# Patient Record
Sex: Male | Born: 1940 | Race: White | Hispanic: No | Smoking: Current every day smoker
Health system: Southern US, Community
[De-identification: ages and names within clinical notes are randomized; demographics above are authoritative.]

## PROBLEM LIST (undated history)

## (undated) ENCOUNTER — Emergency Department (HOSPITAL_COMMUNITY): Admission: EM | Payer: Medicare Other | Source: Home / Self Care

## (undated) DIAGNOSIS — A481 Legionnaires' disease: Secondary | ICD-10-CM

## (undated) DIAGNOSIS — I350 Nonrheumatic aortic (valve) stenosis: Secondary | ICD-10-CM

## (undated) DIAGNOSIS — G43909 Migraine, unspecified, not intractable, without status migrainosus: Secondary | ICD-10-CM

## (undated) DIAGNOSIS — R519 Headache, unspecified: Secondary | ICD-10-CM

## (undated) DIAGNOSIS — M4690 Unspecified inflammatory spondylopathy, site unspecified: Secondary | ICD-10-CM

## (undated) DIAGNOSIS — J189 Pneumonia, unspecified organism: Secondary | ICD-10-CM

## (undated) DIAGNOSIS — E7800 Pure hypercholesterolemia, unspecified: Secondary | ICD-10-CM

## (undated) DIAGNOSIS — R112 Nausea with vomiting, unspecified: Secondary | ICD-10-CM

## (undated) DIAGNOSIS — J80 Acute respiratory distress syndrome: Secondary | ICD-10-CM

## (undated) DIAGNOSIS — Z8719 Personal history of other diseases of the digestive system: Secondary | ICD-10-CM

## (undated) DIAGNOSIS — Z86010 Personal history of colonic polyps: Secondary | ICD-10-CM

## (undated) DIAGNOSIS — J449 Chronic obstructive pulmonary disease, unspecified: Secondary | ICD-10-CM

## (undated) DIAGNOSIS — M1990 Unspecified osteoarthritis, unspecified site: Secondary | ICD-10-CM

## (undated) DIAGNOSIS — I10 Essential (primary) hypertension: Secondary | ICD-10-CM

## (undated) DIAGNOSIS — M542 Cervicalgia: Secondary | ICD-10-CM

## (undated) DIAGNOSIS — I639 Cerebral infarction, unspecified: Secondary | ICD-10-CM

## (undated) DIAGNOSIS — T4145XA Adverse effect of unspecified anesthetic, initial encounter: Secondary | ICD-10-CM

## (undated) DIAGNOSIS — F419 Anxiety disorder, unspecified: Secondary | ICD-10-CM

## (undated) DIAGNOSIS — F410 Panic disorder [episodic paroxysmal anxiety] without agoraphobia: Secondary | ICD-10-CM

## (undated) DIAGNOSIS — Z9889 Other specified postprocedural states: Secondary | ICD-10-CM

## (undated) DIAGNOSIS — Z860101 Personal history of adenomatous and serrated colon polyps: Secondary | ICD-10-CM

## (undated) DIAGNOSIS — I214 Non-ST elevation (NSTEMI) myocardial infarction: Secondary | ICD-10-CM

## (undated) DIAGNOSIS — K219 Gastro-esophageal reflux disease without esophagitis: Secondary | ICD-10-CM

## (undated) DIAGNOSIS — R9430 Abnormal result of cardiovascular function study, unspecified: Secondary | ICD-10-CM

## (undated) DIAGNOSIS — R51 Headache: Secondary | ICD-10-CM

## (undated) HISTORY — DX: Personal history of colonic polyps: Z86.010

## (undated) HISTORY — PX: FOOT NEUROMA SURGERY: SHX646

## (undated) HISTORY — PX: BACK SURGERY: SHX140

## (undated) HISTORY — PX: NECK SURGERY: SHX720

## (undated) HISTORY — DX: Cervicalgia: M54.2

## (undated) HISTORY — DX: Abnormal result of cardiovascular function study, unspecified: R94.30

## (undated) HISTORY — PX: LUMBAR FUSION: SHX111

## (undated) HISTORY — PX: TONSILLECTOMY: SUR1361

## (undated) HISTORY — PX: TRACHEOSTOMY CLOSURE: SHX458

## (undated) HISTORY — PX: TRACHEOSTOMY: SUR1362

## (undated) HISTORY — DX: Gastro-esophageal reflux disease without esophagitis: K21.9

## (undated) HISTORY — PX: CARPAL TUNNEL RELEASE: SHX101

## (undated) HISTORY — DX: Migraine, unspecified, not intractable, without status migrainosus: G43.909

## (undated) HISTORY — DX: Unspecified inflammatory spondylopathy, site unspecified: M46.90

## (undated) HISTORY — DX: Personal history of adenomatous and serrated colon polyps: Z86.0101

## (undated) HISTORY — PX: ROTATOR CUFF REPAIR: SHX139

## (undated) HISTORY — DX: Legionnaires' disease: A48.1

## (undated) HISTORY — DX: Anxiety disorder, unspecified: F41.9

## (undated) HISTORY — PX: SHOULDER SURGERY: SHX246

---

## 1997-09-29 ENCOUNTER — Inpatient Hospital Stay (HOSPITAL_COMMUNITY): Admission: RE | Admit: 1997-09-29 | Discharge: 1997-09-30 | Payer: Self-pay | Admitting: Neurological Surgery

## 1998-01-10 HISTORY — PX: BACK SURGERY: SHX140

## 1998-02-10 ENCOUNTER — Encounter: Admission: RE | Admit: 1998-02-10 | Discharge: 1998-04-15 | Payer: Self-pay | Admitting: Neurological Surgery

## 1998-06-02 ENCOUNTER — Encounter: Payer: Self-pay | Admitting: Neurological Surgery

## 1998-06-02 ENCOUNTER — Ambulatory Visit (HOSPITAL_COMMUNITY): Admission: RE | Admit: 1998-06-02 | Discharge: 1998-06-02 | Payer: Self-pay | Admitting: Neurological Surgery

## 1999-09-21 ENCOUNTER — Encounter: Payer: Self-pay | Admitting: Neurological Surgery

## 1999-09-21 ENCOUNTER — Ambulatory Visit (HOSPITAL_COMMUNITY): Admission: RE | Admit: 1999-09-21 | Discharge: 1999-09-21 | Payer: Self-pay | Admitting: Neurological Surgery

## 1999-10-08 ENCOUNTER — Encounter: Payer: Self-pay | Admitting: Neurological Surgery

## 1999-10-12 ENCOUNTER — Encounter: Payer: Self-pay | Admitting: Neurological Surgery

## 1999-10-12 ENCOUNTER — Inpatient Hospital Stay (HOSPITAL_COMMUNITY): Admission: RE | Admit: 1999-10-12 | Discharge: 1999-10-13 | Payer: Self-pay | Admitting: Neurological Surgery

## 2000-01-10 ENCOUNTER — Encounter: Admission: RE | Admit: 2000-01-10 | Discharge: 2000-04-09 | Payer: Self-pay | Admitting: Neurological Surgery

## 2000-11-27 ENCOUNTER — Emergency Department (HOSPITAL_COMMUNITY): Admission: EM | Admit: 2000-11-27 | Discharge: 2000-11-27 | Payer: Self-pay

## 2000-11-27 ENCOUNTER — Ambulatory Visit (HOSPITAL_BASED_OUTPATIENT_CLINIC_OR_DEPARTMENT_OTHER): Admission: RE | Admit: 2000-11-27 | Discharge: 2000-11-27 | Payer: Self-pay | Admitting: Orthopedic Surgery

## 2002-03-14 ENCOUNTER — Emergency Department (HOSPITAL_COMMUNITY): Admission: EM | Admit: 2002-03-14 | Discharge: 2002-03-14 | Payer: Self-pay | Admitting: Emergency Medicine

## 2002-03-14 ENCOUNTER — Encounter: Payer: Self-pay | Admitting: Emergency Medicine

## 2002-04-25 ENCOUNTER — Ambulatory Visit (HOSPITAL_COMMUNITY): Admission: RE | Admit: 2002-04-25 | Discharge: 2002-04-25 | Payer: Self-pay | Admitting: Internal Medicine

## 2002-04-25 ENCOUNTER — Encounter: Payer: Self-pay | Admitting: Internal Medicine

## 2002-05-27 ENCOUNTER — Emergency Department (HOSPITAL_COMMUNITY): Admission: EM | Admit: 2002-05-27 | Discharge: 2002-05-27 | Payer: Self-pay | Admitting: Emergency Medicine

## 2002-05-27 ENCOUNTER — Encounter: Payer: Self-pay | Admitting: Emergency Medicine

## 2002-08-27 ENCOUNTER — Encounter: Admission: RE | Admit: 2002-08-27 | Discharge: 2002-10-10 | Payer: Self-pay | Admitting: Neurosurgery

## 2002-09-17 ENCOUNTER — Ambulatory Visit (HOSPITAL_BASED_OUTPATIENT_CLINIC_OR_DEPARTMENT_OTHER): Admission: RE | Admit: 2002-09-17 | Discharge: 2002-09-18 | Payer: Self-pay | Admitting: Orthopedic Surgery

## 2002-12-31 ENCOUNTER — Inpatient Hospital Stay (HOSPITAL_COMMUNITY): Admission: RE | Admit: 2002-12-31 | Discharge: 2003-01-01 | Payer: Self-pay | Admitting: Neurological Surgery

## 2003-11-28 ENCOUNTER — Emergency Department (HOSPITAL_COMMUNITY): Admission: EM | Admit: 2003-11-28 | Discharge: 2003-11-28 | Payer: Self-pay | Admitting: Emergency Medicine

## 2004-09-25 ENCOUNTER — Emergency Department (HOSPITAL_COMMUNITY): Admission: EM | Admit: 2004-09-25 | Discharge: 2004-09-26 | Payer: Self-pay | Admitting: Emergency Medicine

## 2004-11-22 ENCOUNTER — Encounter: Admission: RE | Admit: 2004-11-22 | Discharge: 2004-11-22 | Payer: Self-pay | Admitting: Family Medicine

## 2004-11-23 ENCOUNTER — Encounter: Admission: RE | Admit: 2004-11-23 | Discharge: 2004-11-23 | Payer: Self-pay | Admitting: Family Medicine

## 2005-01-26 ENCOUNTER — Emergency Department (HOSPITAL_COMMUNITY): Admission: EM | Admit: 2005-01-26 | Discharge: 2005-01-26 | Payer: Self-pay | Admitting: Emergency Medicine

## 2005-03-23 ENCOUNTER — Ambulatory Visit (HOSPITAL_COMMUNITY): Admission: RE | Admit: 2005-03-23 | Discharge: 2005-03-23 | Payer: Self-pay | Admitting: Interventional Cardiology

## 2005-05-11 ENCOUNTER — Encounter: Payer: Self-pay | Admitting: Neurological Surgery

## 2006-07-13 ENCOUNTER — Encounter (INDEPENDENT_AMBULATORY_CARE_PROVIDER_SITE_OTHER): Payer: Self-pay | Admitting: Orthopedic Surgery

## 2006-07-13 ENCOUNTER — Ambulatory Visit (HOSPITAL_BASED_OUTPATIENT_CLINIC_OR_DEPARTMENT_OTHER): Admission: RE | Admit: 2006-07-13 | Discharge: 2006-07-13 | Payer: Self-pay | Admitting: Orthopedic Surgery

## 2007-06-16 ENCOUNTER — Emergency Department (HOSPITAL_COMMUNITY): Admission: EM | Admit: 2007-06-16 | Discharge: 2007-06-17 | Payer: Self-pay | Admitting: Emergency Medicine

## 2007-06-21 ENCOUNTER — Ambulatory Visit (HOSPITAL_COMMUNITY): Admission: RE | Admit: 2007-06-21 | Discharge: 2007-06-21 | Payer: Self-pay | Admitting: Neurological Surgery

## 2007-06-29 ENCOUNTER — Ambulatory Visit (HOSPITAL_COMMUNITY): Admission: RE | Admit: 2007-06-29 | Discharge: 2007-06-30 | Payer: Self-pay | Admitting: Neurological Surgery

## 2009-03-19 ENCOUNTER — Encounter: Admission: RE | Admit: 2009-03-19 | Discharge: 2009-04-23 | Payer: Self-pay | Admitting: Family Medicine

## 2010-01-31 ENCOUNTER — Encounter: Payer: Self-pay | Admitting: Family Medicine

## 2010-05-25 NOTE — Op Note (Signed)
NAMEJOSTEN, Rick Church                ACCOUNT NO.:  [id number]   MEDICAL RECORD NO.:  [id number]          PATIENT TYPE:  AMB   LOCATION:  DSC                          FACILITY:  [facility]   PHYSICIAN:  Katy Fitch. Sypher, M.D. DATE OF BIRTH:  Feb 03, 1940   DATE OF PROCEDURE:  07/13/2006  DATE OF DISCHARGE:                               OPERATIVE REPORT   PREOPERATIVE DIAGNOSIS:  Painful left index finger status post  amputation at proximal epiphysis of middle phalanx in November 2002 with  clinical evidence of a painful radial proper digital nerve neuroma bulb.   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   OPERATION:  1. Revision amputation of left index finger with removal of the      epiphysis of middle phalanx and remodeling of condyles of proximal      phalanx.  2. Resection of a 7 mm neuroma bulb from the radial proper digital      nerve.   OPERATIONS:  Katy Fitch. Sypher, M.D.   ASSISTANT:  Marveen Reeks. Dasnoit, P.A.-C.   ANESTHESIA:  2% lidocaine metacarpal head level block of left index  finger supplemented by IV sedation.   SUPERVISING ANESTHESIOLOGIST:  Zenon Mayo, M.D.   INDICATIONS:  Rick Church is a 70 year old gentleman well acquainted  with our practice.  We have cared for Rick Church for many years for  degenerative disc disease and bilateral rotator cuff pathology.  In  November 2002, he sustained a very untidy injury of the left index  finger distal to the diaphysis of the middle phalanx.  He was seen in  consultation by my associate, Dr. Dairl Ponder, and underwent  revision amputation of the left index finger.  Over time, Rick Church  developed a hyperextension posture of his remnant of the middle phalanx  and a painful  neuroma bulb over the radial condyle of the proximal  phalanx.  I could palpate a neuroma bulb at least 5-6 mm in diameter.  Due to a failure to respond to nonoperative measures, Rick Church  requested a revision of his fingertip to correct his pain predicament.  Preoperatively, we advised him that we would likely resect the neuroma  bulb and could leave him with some residual numbness in his index stump.  He understood that due to the deformity of the middle phalanx at the PIP  joint, further revision of the finger may be necessary.  He is brought  to the operating at this time anticipating correction of his painful  neuroma predicament.   PROCEDURE:  Rick Church is brought to the operating room and placed in  the supine position on the operating table.  Following light sedation, a  2% lidocaine metacarpal head level block was placed without  complication.  After a few moments, complete anesthesia of the left  index finger was achieved.  Rick Church left hand and arm were prepped  with Betadine soap solution and sterilely draped.  A pneumatic  tourniquet was applied to the proximal brachium.  Following  exsanguination of the left arm with an Esmarch bandage, the arterial  tourniquet was inflated to 220 mmHg.  The procedure commenced with a  fishmouth incision planned to resect the prior surgical scar.   Subcutaneous dissection was carried along the periosteal and flexor  sheath level to expose the radial and ulnar neurovascular bundles.  On  the radial aspect of the finger, there was a neuroma in continuity  measuring about 7 mm in maximum diameter in the radial proper digital  nerve.  This was apparently due to tenting of the radial proper digital  nerve over the radial condyle of the proximal phalangeal head.  The  middle phalangeal stump had no flexor tendon attachment and due to the  extensor attachment, was hyperextended about 30 degrees.  It was  apparent that to correct this  predicament minimizing denervation of the  fingertip, it would be appropriate to resect the remnant of the middle  phalanx and perform a proper revision amputation of the finger.   The collateral ligaments were excised and the stump of the epiphysis of  the middle phalanx was removed. The condyles of the proximal phalanx  were carefully shaped with a small rongeur to a bullet shape removing  the prominent condyles. The radial proper digital nerve neuroma was  placed under tension, circumferentially dissected with a fresh 15  scalpel blade, transected to a normal appearing proper digital nerve,  and allowed to retract into the virgin fat overlying the P1 segment of  the finger.  The ulnar proper digital nerve neuroma was inspected and  found be of normal caliber.  This was left alone.  Several vessels were  electrocauterized with bipolar cautery.  The remnant of the flexor  tendon and volar plate were then repaired to the extensor tendon to  prevent retraction.  This should facilitate MP flexion.   The skin margins were then tailored to allow a modified fishmouth with a  longer volar flap.  The wound was repaired with segmental intradermal 3-  0 Prolene.  The tourniquet was released and capillary refill was  immediate to the finger stump.  Bleeding was controlled by direct  pressure followed by dressing the finger with Xeroflow sterile gauze and  a Coban wrist base dressing.  There were no apparent complications.  Mr.  Church tolerated the surgery and anesthesia well and was transferred to  the recovery room with stable vital signs.   He will be discharged with a prescription for Dilaudid 2 mg, 1-2 tablets  p.o. q.4-6h. p.r.n. pain and doxycycline 100 mg 1 tablet p.o. b.i.d. x5  days as prophylactic antibiotic.      Katy Fitch Sypher, M.D.  Electronically Signed     RVS/MEDQ  D:  07/13/2006  T:  07/13/2006  Job:  [id number]   cc:   Rick Church, M.D.

## 2010-05-25 NOTE — Op Note (Signed)
NAMEDERWARD, MARPLE                ACCOUNT NO.:  [id number]   MEDICAL RECORD NO.:  [id number]          PATIENT TYPE:  INP   LOCATION:  3526                         FACILITY:  [facility]   PHYSICIAN:  Stefani Dama, M.D.  DATE OF BIRTH:  [birth year]   DATE OF PROCEDURE:  06/29/2007  DATE OF DISCHARGE:                               OPERATIVE REPORT   PREOPERATIVE DIAGNOSIS:  Herniated nucleus pulposus L4-L5 left with left  lumbar radiculopathy.   POSTOPERATIVE DIAGNOSIS:  Herniated nucleus pulposus L4-L5 left with  left lumbar radiculopathy.   PROCEDURE:  Microdiskectomy L4-L5 left with operating microscope and  microdissection technique.   SURGEON:  Stefani Dama, MD   FIRST ASSISTANT:  Coletta Memos, MD   ANESTHESIA:  General endotracheal.   INDICATIONS:  Rick Church is a 70 year old individual who has had  significant back and left lower extremity pain.  He has evidence of  large herniated nucleus pulposus at L4-L5 on the left side.  After  careful consideration of his options, I advised surgical decompression  of the herniated disk, now taken to the operating room.   PROCEDURE:  The patient was brought to the operating room supine on the  stretcher after smooth induction of general endotracheal anesthesia, he  was turned prone.  Back was prepped with alcohol and DuraPrep and draped  in a sterile fashion.  Midline incision was made over the area,  corresponding to L4-L5.  The lumbodorsal fascia was dissected on the  left side and the interlaminar space at L4-L5 was marked and localized  positively with a radiograph.  Laminotomy was then performed to remove  the inferior margin lamina of L4 out to the medial wall of the facet.  Yellow ligament in this region was taken up with 2 and 3 mm Kerrison  punch.  Common dural tube was exposed and it noted to be both  significantly dorsally in the lateral aspect just at the takeoff of the  L5 nerve root.  Then with careful dissection of  the L5 nerve root, it  could be mobilized medially off the mass and the mass itself was incised  and was found be several fragments of disk material in this area.  Disk  fragments were removed and this allowed for good and immediate  decompression of the takeoff of the L5 nerve root at the L4-L5 space. As  the disk was removed, the entire area of the ligament itself was  explored.  There was noted be no opening into the disk space at this  point.  The disk space itself felt fairly flat and closed and no  entrance into it could be identified with the area of the common dural  tube and the nerve root being well-decompressed, no other fragments of  disk being found after probing with a long 9 and 10 mm hooks, it was  decided to terminate the procedure.  The microscope was withdrawn  from the field and lumbodorsal fascia was closed with #1 Vicryl in  interrupted fashion, 2-0 Vicryl was used in the subcutaneous tissues, 3-  0 Vicryl subcuticularly and  a dry sterile dressing on the skin.  The  patient tolerated the procedure well and was turned to recovery room in  stable condition.      Stefani Dama, M.D.  Electronically Signed     HJE/MEDQ  D:  06/29/2007  T:  06/30/2007  Job:  [id number]

## 2010-05-28 NOTE — Cardiovascular Report (Signed)
Rick Church, Rick Church NO.:  [id number]   MEDICAL RECORD NO.:  [id number]          PATIENT TYPE:  OIB   LOCATION:  2899                         FACILITY:  [facility]   PHYSICIAN:  Corky Crafts, MDDATE OF BIRTH:  1940/05/09   DATE OF PROCEDURE:  03/23/2005  DATE OF DISCHARGE:                              CARDIAC CATHETERIZATION   REFERRING PHYSICIAN:  Dr. Lavonda Jumbo.   PROCEDURE PERFORMED:  Left heart catheterization, coronary angiogram, left  ventriculogram.   INDICATIONS:  Abnormal stress test and chest pain.   OPERATOR:  Dr. Eldridge Dace.   PROCEDURAL NARRATIVE:  The risks and benefits of cardiac catheterization  were explained to the patient and informed consent was obtained. The patient  was brought to the catheterization laboratory and placed on the table. He  was prepped and draped in the usual sterile fashion. His right groin was  infiltrated with 1% lidocaine. A 6-French arterial sheath was placed in his  right femoral artery using the modified Seldinger technique. Left coronary  artery angiography was then performed using a JL-4.0 catheter. The catheter  was placed in the ostium of the left main under fluoroscopic guidance.  Digital angiography was performed in multiple projections using hand  injection of contrast. The right coronary artery angiography was then  performed with a JR-4.0 catheter. The catheter was placed in the vessel  ostium under fluoroscopic guidance. Digital angiography was performed in  multiple projections using hand injection of contrast. Left ventriculography  was then performed. A pigtail catheter was advanced to the ascending aorta  and across the aortic valve under fluoroscopic guidance. Hemodynamic  pressure measurements were obtained inside left ventricle. A RAO  ventriculogram was obtained using power injection of contrast. The pigtail  catheter was then pulled back under continuous hemodynamic monitoring. The  right  femoral arterial sheath will then be removed using manual compression.   FINDINGS:  The left main coronary artery is a short vessel with an ostial  20% plaque. The circumflex is a medium-sized vessel with luminal  irregularities. The first obtuse marginal is a large vessel with luminal  irregularities. There is ectasia noted in the proximal vessel. The second  obtuse marginal is a small vessel. The left anterior descending is a large  vessel with mild diffuse atherosclerosis. There is mild ectasia noted in the  proximal to midportion of the vessel. In the distal LAD, there is ectasia as  well and irregularity noted in the vessel likely representing mild  atherosclerosis. The right coronary artery is a large dominant vessel with  mild atherosclerosis. There is significant ectasia noted in the mid-vessel.   HEMODYNAMIC RESULTS:  Left ventricular pressure 138/2 with a left  ventricular end-diastolic pressure of 20 mmHg. Aortic pressure 136/63 with a  mean aortic pressure of 94 mmHg. Left ventriculogram showed normal left  ventricular function. There was no mitral regurgitation and the estimated  ejection fraction is 60%.   IMPRESSION:  1.  Mild nonobstructive coronary artery disease. There is diffuse ectasia      noted in the coronary arteries. There is also a 20% ostial  left main      plaque noted.  2.  Normal left ventricular function, ejection fraction is 60%. Left      ventricular end-diastolic pressures is 20 mmHg.   RECOMMENDATIONS:  1.  I encouraged the patient to stop smoking.  2.  The patient should continue aggressive risk factor modification      including blood pressure and cholesterol control. I would seriously      consider a lipid lowering medicine in this patient given the amount of      atherosclerosis that he has.  3.  Given that there are no hemodynamically significant lesions, no      revascularization is indicated at this time.  4.  The patient will be discharged  later today after the sheath pull and      after it is established that his groin is stable.      Corky Crafts, MD  Electronically Signed     JSV/MEDQ  D:  03/23/2005  T:  03/24/2005  Job:  [id number]

## 2010-05-28 NOTE — Consult Note (Signed)
[place]  Patient:    Rick Church, Rick Church Visit Number: [id number] MRN: [id number]          Service Type: DSU Location: [facility] Attending Physician:  Marlowe Shores Dictated by:   Artist Pais Mina Marble, M.D. Proc. 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  He presents with an open injury, complex.  He is an otherwise fairly healthy 70 year old with hypertension and hypercholesterolemia being treated with oral medications.  PAST SURGICAL HISTORY:  Significant for shoulder surgery on the right by Dr. Josephine Igo and neck surgery by Dr. Danielle Dess.   He is current unemployed.  FAMILY MEDICAL HISTORY:  Noncontributory.  SOCIAL HISTORY:  Occasional alcohol and smokes two packs of cigarettes per day.  PHYSICAL EXAMINATION:  GENERAL:  Alert, well-nourished male, pleasant, oriented x 3.  EXTREMITIES:  On examination of his left index finger, he has an obvious segmental open injury with loss of extensor tendon, comminuted segmental injury to the distal and middle phalanges, and complete avulsion of the soft tissues and bone except for the profundus tendon.  Distally there is no sensation and a ______  distal segment.  LABORATORY DATA:  X-rays show comminuted fracture of the distal and middle phalanges.  A 70 year old male with complex open segmental injuries to his index finger on the left which is not able to be reconstructed.  At this point in time, we advise him to proceed with ______ amputation to achieve primary closure.  We are going to go ahead and get him set up to go to the day surgery center as an outpatient as soon as possible. Dictated by:   Artist Pais Mina Marble, M.D. Attending Physician:  Marlowe Shores DD:  11/28/00 TD:  11/28/00 Job: [id number]

## 2010-07-16 ENCOUNTER — Other Ambulatory Visit: Payer: Self-pay | Admitting: Family Medicine

## 2010-07-16 NOTE — Telephone Encounter (Signed)
Dr. Lynelle Doctor,  This rx request was in my box, patient last seen in 2011 by you, no appt scheduled here. Thanks.

## 2010-10-07 LAB — CBC
HCT: 41.3
Hemoglobin: 14.2
MCHC: 34.3
MCHC: 34.5
MCV: 93.7
Platelets: 237
RBC: 4.4
RBC: 4.59
RDW: 13.5
WBC: 10.4
WBC: 9.9

## 2010-10-07 LAB — POCT CARDIAC MARKERS
CKMB, poc: <1 — ABNORMAL LOW
CKMB, poc: <1 — ABNORMAL LOW
Myoglobin, poc: 46.1
Myoglobin, poc: 46.5
Operator id: 234501
Operator id: 234501
Troponin i, poc: 0.29 — ABNORMAL HIGH
Troponin i, poc: <0.05

## 2010-10-07 LAB — BASIC METABOLIC PANEL
Calcium: 8.8
Creatinine, Ser: 0.75
GFR calc Af Amer: >60
GFR calc non Af Amer: >60

## 2010-10-07 LAB — DIFFERENTIAL
Basophils Absolute: 0.1
Basophils Relative: 1
Eosinophils Absolute: 0.2
Eosinophils Relative: 2
Lymphocytes Relative: 26
Lymphs Abs: 2.7
Monocytes Absolute: 0.9
Monocytes Relative: 8
Neutro Abs: 6.6
Neutrophils Relative %: 63

## 2010-10-26 LAB — POCT HEMOGLOBIN-HEMACUE
Hemoglobin: 16.3
Operator id: 128471

## 2010-10-27 LAB — BASIC METABOLIC PANEL
BUN: 22
GFR calc non Af Amer: >60
Potassium: 3.8
Sodium: 140

## 2012-04-20 ENCOUNTER — Ambulatory Visit: Payer: Self-pay | Admitting: Sports Medicine

## 2012-04-23 ENCOUNTER — Other Ambulatory Visit (HOSPITAL_COMMUNITY): Payer: Self-pay | Admitting: Orthopedic Surgery

## 2012-04-23 DIAGNOSIS — Z8739 Personal history of other diseases of the musculoskeletal system and connective tissue: Secondary | ICD-10-CM

## 2012-05-03 ENCOUNTER — Ambulatory Visit (HOSPITAL_COMMUNITY)
Admission: RE | Admit: 2012-05-03 | Discharge: 2012-05-03 | Disposition: A | Discharge status: Home / Self Care | Payer: Medicare Other | Source: Ambulatory Visit | Attending: Orthopedic Surgery | Admitting: Orthopedic Surgery

## 2012-05-03 ENCOUNTER — Other Ambulatory Visit (HOSPITAL_COMMUNITY): Payer: Self-pay | Admitting: Orthopedic Surgery

## 2012-05-03 DIAGNOSIS — Z8739 Personal history of other diseases of the musculoskeletal system and connective tissue: Secondary | ICD-10-CM

## 2012-05-03 DIAGNOSIS — M25519 Pain in unspecified shoulder: Secondary | ICD-10-CM | POA: Insufficient documentation

## 2013-01-10 DIAGNOSIS — J189 Pneumonia, unspecified organism: Secondary | ICD-10-CM

## 2013-01-10 HISTORY — DX: Pneumonia, unspecified organism: J18.9

## 2013-01-16 ENCOUNTER — Encounter: Payer: Self-pay | Admitting: Sports Medicine

## 2013-01-16 ENCOUNTER — Ambulatory Visit (INDEPENDENT_AMBULATORY_CARE_PROVIDER_SITE_OTHER): Payer: Medicare Other | Admitting: Sports Medicine

## 2013-01-16 VITALS — BP 156/79 | Ht 66.0 in | Wt 172.0 lb

## 2013-01-16 DIAGNOSIS — M25562 Pain in left knee: Secondary | ICD-10-CM

## 2013-01-16 DIAGNOSIS — M25569 Pain in unspecified knee: Secondary | ICD-10-CM

## 2013-01-16 DIAGNOSIS — M25561 Pain in right knee: Secondary | ICD-10-CM

## 2013-01-16 MED ORDER — METHYLPREDNISOLONE ACETATE 40 MG/ML IJ SUSP
40.0000 mg | Freq: Once | INTRAMUSCULAR | Status: AC
Start: 2013-01-16 — End: 2013-01-16
  Administered 2013-01-16: 40 mg via INTRA_ARTICULAR

## 2013-01-16 MED ORDER — METHYLPREDNISOLONE ACETATE 40 MG/ML IJ SUSP
40.0000 mg | Freq: Once | INTRAMUSCULAR | Status: AC
  Administered 2013-01-16: 40 mg via INTRA_ARTICULAR

## 2013-01-16 NOTE — Progress Notes (Signed)
   Subjective:    Patient ID: Rick Church, male    DOB: 08/07/1940, 73 y.o.   MRN: [id number]  HPI chief complaint: Bilateral knee pain  Very pleasant 73 year old male comes in today complaining of bilateral knee pain. He was a patient of mine at [place]. Has a history of intermittent knee pain. Has responded well to injections in the past. Current symptoms started 3 weeks ago without any trauma. Bilateral medial sided knee pain, left greater than right. No swelling. No mechanical symptoms. No giving way. Worse with activity. No fevers or chills. He takes an occasional Lyrica which seems to help. Denies pain more proximally in his groin. No numbness or tingling.  Medical history positive for hypercholesterolemia and hypertension Medications include simvastatin, felodipine, and when necessary Lyrica Allergic to aspirin, codeine, and penicillin He smokes 1-1/2 packs cigarettes a day Denies alcohol use Works as a Clinical cytogeneticist    Review of Systems As above    Objective:   Physical Exam Well-developed, well-nourished. No acute distress. Awake alert and oriented x3. Vital signs reviewed.  Examination of each knee shows full range of motion bilaterally. No effusions. Mild tenderness to palpation along each medial joint line but a negative McMurray's. No tenderness along the lateral joint lines. Each knee is stable to ligamentous exam. No popliteal cyst. No malalignment. Negative McMurray's bilaterally. Neurovascularly intact distally. Walking without significant limp.       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 An anterior lateral approach was utilized. Patient tolerated this without difficulty. Continue with activity as tolerated. If symptoms persist I would start with getting some updated plain x-rays of his knees.  Of note, he is requesting a second opinion on his left foot. He had some sort  of surgery done by Dr. Milinda Pointer at [facility] but continues to have pain. I will get the records from Dr. Stephenie Acres office in anticipation of referring the patient to Dr. Doran Durand for second opinion. I will also get the records from [facility]. Patient will followup with me when necessary.  Consent obtained and verified. Time-out conducted. Noted no overlying erythema, induration, or other signs of local infection. Skin prepped in a sterile fashion. Topical analgesic spray: Ethyl chloride. Joint: left knee Needle: 22g 1.5 inch Completed without difficulty. Meds: 3cc 1% xylocaine, 1cc (40mg ) depomedrol  Advised to call if fevers/chills, erythema, induration, drainage, or persistent bleeding.  Consent obtained and verified. Time-out conducted. Noted no overlying erythema, induration, or other signs of local infection. Skin prepped in a sterile fashion. Topical analgesic spray: Ethyl chloride. Joint: right knee Needle: 22g 1.5 inch Completed without difficulty. Meds: 3cc 1% xylocaine, 1cc (40mg ) depomedrol  Advised to call if fevers/chills, erythema, induration, drainage, or persistent bleeding.

## 2013-04-04 ENCOUNTER — Other Ambulatory Visit: Payer: Self-pay | Admitting: *Deleted

## 2013-04-04 MED ORDER — INDOMETHACIN 50 MG PO CAPS: 50.0000 mg | ORAL_CAPSULE | Freq: Two times a day (BID) | ORAL | Status: DC

## 2013-04-24 ENCOUNTER — Encounter: Payer: Self-pay | Admitting: Podiatrist

## 2013-04-24 ENCOUNTER — Ambulatory Visit (INDEPENDENT_AMBULATORY_CARE_PROVIDER_SITE_OTHER): Payer: Medicare Other | Admitting: Podiatrist

## 2013-04-24 ENCOUNTER — Ambulatory Visit (INDEPENDENT_AMBULATORY_CARE_PROVIDER_SITE_OTHER): Payer: Medicare Other

## 2013-04-24 VITALS — BP 138/90 | HR 52 | Resp 17 | Ht 68.0 in | Wt 180.0 lb

## 2013-04-24 DIAGNOSIS — D3613 Benign neoplasm of peripheral nerves and autonomic nervous system of lower limb, including hip: Secondary | ICD-10-CM

## 2013-04-24 DIAGNOSIS — L600 Ingrowing nail: Secondary | ICD-10-CM

## 2013-04-24 DIAGNOSIS — D2120 Benign neoplasm of connective and other soft tissue of unspecified lower limb, including hip: Secondary | ICD-10-CM

## 2013-04-24 NOTE — Progress Notes (Signed)
   Subjective:    Patient ID: Rick Church, male    DOB: 28-Sep-1940, 73 y.o.   MRN: [id number]  HPI Comments: N toe pain L left 4th toe D 1 month after the neurectomy of 3rd interdigital space 09/30/2011 O C burning pain A stepping on hard surface, or down hill walking T Dr Milinda Pointer reinjected after the surgery   Toe Pain       Review of Systems  All other systems reviewed and are negative.      Objective:   Physical Exam Vascular status intact. Pedal pulses palpable bilateral at 2/4 dp and pt.  Neurological sensation reveals neuroma type pain and a palpable click 3rd interspace left foot.  Subjective complaints of burning and numbness and pain at the 3rd interspace left is present.  In the past the patient had a ligament release-- the nerve was not removed from his prior surgery.  Biomechanical exam reveals mildly contracted digits.  Digital nails 1,2 left were also permanently removed in the past but the procedure was not successful and the nails grew back deformed.  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 Recommended a excision of nerve 3rd interspace through a plantar incision on the left foot; and a chemical matrixectomy with sodium hydroxide digital nails 1,2. The consent form was discussed and all three pages were signed and the patient's questions were encouraged and answered to the best of my ability. Risks of the surgery were discussed including but not limited to continued pain, infection, swelling, suture or implant reaction. Preoperative instructions were also dispensed to the patient as well as a preoperative surgical pamphlet to go along with the instructions. Surgery will be scheduled at the patients convenience and patient will be seen at [facility] on outpatient basis. If  any questions or concerns prior to her surgical date He  is instructed to call.

## 2013-04-24 NOTE — Patient Instructions (Signed)
Pre-Operative Instructions  Congratulations, you have decided to take an important step to improving your quality of life.  You can be assured that the doctors of [facility] will be with you every step of the way.  1. Plan to be at the surgery center/hospital at least 1 (one) hour prior to your scheduled time unless otherwise directed by the surgical center/hospital staff.  You must have a responsible adult accompany you, remain during the surgery and drive you home.  Make sure you have directions to the surgical center/hospital and know how to get there on time. 2. For hospital based surgery you will need to obtain a history and physical form from your family physician within 1 month prior to the date of surgery- we will give you a form for you primary physician.  3. We make every effort to accommodate the date you request for surgery.  There are however, times where surgery dates or times have to be moved.  We will contact you as soon as possible if a change in schedule is required.   4. No Aspirin/Ibuprofen for one week before surgery.  If you are on aspirin, any non-steroidal anti-inflammatory medications (Mobic, Aleve, Ibuprofen) you should stop taking it 7 days prior to your surgery.  You make take Tylenol  For pain prior to surgery.  5. Medications- If you are taking daily heart and blood pressure medications, seizure, reflux, allergy, asthma, anxiety, pain or diabetes medications, make sure the surgery center/hospital is aware before the day of surgery so they may notify you which medications to take or avoid the day of surgery. 6. No food or drink after midnight the night before surgery unless directed otherwise by surgical center/hospital staff. 7. No alcoholic beverages 24 hours prior to surgery.  No smoking 24 hours prior to or 24 hours after surgery. 8. Wear loose pants or shorts- loose enough to fit over bandages, boots, and casts. 9. No slip on shoes, sneakers are best. 10. Bring  your boot with you to the surgery center/hospital.  Also bring crutches or a walker if your physician has prescribed it for you.  If you do not have this equipment, it will be provided for you after surgery. 11. If you have not been contracted by the surgery center/hospital by the day before your surgery, call to confirm the date and time of your surgery. 12. Leave-time from work may vary depending on the type of surgery you have.  Appropriate arrangements should be made prior to surgery with your employer. 13. Prescriptions will be provided immediately following surgery by your doctor.  Have these filled as soon as possible after surgery and take the medication as directed. 14. Remove nail polish on the operative foot. 15. Wash the night before surgery.  The night before surgery wash the foot and leg well with the antibacterial soap provided and water paying special attention to beneath the toenails and in between the toes.  Rinse thoroughly with water and dry well with a towel.  Perform this wash unless told not to do so by your physician.  Enclosed: 1 Ice pack (please put in freezer the night before surgery)   1 Hibiclens skin cleaner   Pre-op Instructions  If you have any questions regarding the instructions, do not hesitate to call our office.  [place]: [address] [place], [place] [address] [phone]  [place]: [address] , [place] [address] [phone]  [place]: [address]  [place],  [address] [phone]  Dr. Richard   Tuchman DPM, Dr. Norman Regal DPM Dr. Richard Sikora DPM, Dr. M. Todd Hyatt DPM, Dr. Lindley Hiney DPM 

## 2013-05-01 DIAGNOSIS — G5760 Lesion of plantar nerve, unspecified lower limb: Secondary | ICD-10-CM

## 2013-05-01 DIAGNOSIS — L600 Ingrowing nail: Secondary | ICD-10-CM

## 2013-05-08 ENCOUNTER — Encounter: Payer: Self-pay | Admitting: Podiatrist

## 2013-05-08 ENCOUNTER — Encounter: Payer: Medicare Other | Admitting: Podiatrist

## 2013-05-08 ENCOUNTER — Ambulatory Visit (INDEPENDENT_AMBULATORY_CARE_PROVIDER_SITE_OTHER): Payer: Medicare Other | Admitting: Podiatrist

## 2013-05-08 VITALS — BP 130/60 | HR 64 | Resp 16

## 2013-05-08 DIAGNOSIS — L600 Ingrowing nail: Secondary | ICD-10-CM

## 2013-05-08 DIAGNOSIS — D2120 Benign neoplasm of connective and other soft tissue of unspecified lower limb, including hip: Secondary | ICD-10-CM

## 2013-05-08 DIAGNOSIS — D3613 Benign neoplasm of peripheral nerves and autonomic nervous system of lower limb, including hip: Secondary | ICD-10-CM

## 2013-05-08 NOTE — Progress Notes (Signed)
Subjective: Patient presents today1 week status post foot surgery of the left foot.  Date of surgery 05/01/2013-excision of digital nerve third interspace plantar, permanent removal of digital nails 1 and 2 left. Patient denies nausea, vomiting, fevers, chills or night sweats.  Denies calf pain or tenderness to the operative side. Overall he states he's doing well. He called me on the emergency pager over the weekend complaining of knee pain. He took some indomethacin and states that the pain is immediately resolved. He thinks it at work he's been elevating his foot on his crane and it caused his knee to be uncomfortable. He states he's fine now.  Objective:  Neurovascular status is intact with palpable pedal pulses DP and PT bilateral at 2+ out of 4. Neurological sensation is intact and unchanged as per prior to surgery. Excellent appearance of the postoperative foot is noted. Incision sites are well coapted with sutures in place. Dressing was clean, dry and intact to the left foot. Overall appearance is excellent. No pain is elicited subjectively.  Assessment: Status post left foot excision of plantar nerve and removal of nails one and 2 left  Plan:  Redressed the foot and a dry, sterile and compressive dressing. Recommended keeping it dry for one more week. He will be seen back next week and we will remove sutures on the plantar foot if they are ready. If he has any problems or concerns prior that visit he is instructed to call.

## 2013-05-17 ENCOUNTER — Encounter: Payer: Medicare Other | Admitting: Podiatrist

## 2013-06-04 ENCOUNTER — Encounter: Payer: Self-pay | Admitting: Podiatrist

## 2013-06-05 ENCOUNTER — Encounter: Payer: Self-pay | Admitting: Podiatrist

## 2013-06-05 ENCOUNTER — Ambulatory Visit (INDEPENDENT_AMBULATORY_CARE_PROVIDER_SITE_OTHER): Payer: Medicare Other | Admitting: Podiatrist

## 2013-06-05 VITALS — BP 146/80 | HR 58 | Temp 98.0°F | Resp 16 | Ht 68.0 in | Wt 173.0 lb

## 2013-06-05 DIAGNOSIS — D2120 Benign neoplasm of connective and other soft tissue of unspecified lower limb, including hip: Secondary | ICD-10-CM

## 2013-06-05 DIAGNOSIS — D3613 Benign neoplasm of peripheral nerves and autonomic nervous system of lower limb, including hip: Secondary | ICD-10-CM

## 2013-06-05 MED ORDER — AMOXICILLIN-POT CLAVULANATE 875-125 MG PO TABS: 1.0000 | ORAL_TABLET | Freq: Two times a day (BID) | ORAL | Status: DC

## 2013-06-05 NOTE — Patient Instructions (Signed)
Put the orange goop on your leaky spot of your foot and cover with a bandaid every day-- take the antibiotic pill twice a day

## 2013-06-05 NOTE — Progress Notes (Signed)
Chief Complaint  Patient presents with  . Post-op Problem    "It got to leaking the other day."  left foot plantar surgery incision site.     HPI: Patient is 73 y.o. male who presents today for a wound dehiscence at the site of a neuroma removal.  He states it got to leaking the other day and relates it is sore.  He denies any trauma or injury.  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  He has been wearing his modified sneaker on this left foot to take the weight off the incision site.       Physical Exam GENERAL APPEARANCE: Alert, conversant. Appropriately groomed. No acute distress.  VASCULAR: Pedal pulses palpable at 1/4 DP and PT bilateral.  Capillary refill time is immediate to all digits,  Proximal to distal cooling it warm to warm.  Digital hair growth is present bilateral  NEUROLOGIC: sensation is intact epicritically and protectively to 5.07 monofilament at 5/5 sites bilateral.  Neuroma type pain has resolved from the left foot. The pain now is from the incision gapping open MUSCULOSKELETAL: acceptable muscle strength, tone and stability bilateral.  Intrinsic muscluature intact bilateral.  Rectus appearance of foot and digits noted bilateral.   DERMATOLOGIC: dehiscence of plantar incision where a plantar nerve was removed.  He relates it is uncomfortable when pressed.  No malodor or pirulence is expressed.  No probing to bone.  Dehiscence of the proximal portion of the incision site is noted.    Assessment:  Dehiscence from neuroma surgery submet 3/4 left foot  Plan:  Debrided the macerated tissue and applied iodosorb and a dressing.  Gave a sample of iodosorb and instructed him to apply this every day with a dressing.  Recommended offloading in his surgical boot.  He states he has to continue to work and will not be able to wear the shoe while at work.  I also rx'd antibiotic and  will see him back to recheck the foot.   Plan:

## 2013-06-19 ENCOUNTER — Encounter: Payer: Self-pay | Admitting: Podiatrist

## 2013-06-19 ENCOUNTER — Ambulatory Visit (INDEPENDENT_AMBULATORY_CARE_PROVIDER_SITE_OTHER): Payer: Medicare Other | Admitting: Podiatrist

## 2013-06-19 VITALS — BP 148/86 | HR 86 | Resp 12

## 2013-06-19 DIAGNOSIS — D2120 Benign neoplasm of connective and other soft tissue of unspecified lower limb, including hip: Secondary | ICD-10-CM

## 2013-06-19 DIAGNOSIS — D3613 Benign neoplasm of peripheral nerves and autonomic nervous system of lower limb, including hip: Secondary | ICD-10-CM

## 2013-06-19 DIAGNOSIS — L03119 Cellulitis of unspecified part of limb: Principal | ICD-10-CM

## 2013-06-19 DIAGNOSIS — L02619 Cutaneous abscess of unspecified foot: Secondary | ICD-10-CM

## 2013-06-19 NOTE — Patient Instructions (Signed)
Finish out the antibiotic and use the orange stuff on your foot as long as you are taking the antibiotic

## 2013-06-20 NOTE — Progress Notes (Signed)
Subjective:  Patient presents for follow up of wound dehiscence plantar incision left foot where a neuroma was previously removed.  He has been taking the antibiotics and using the iodosorb and a bandage as instructed.  Relates the area feels and looks better.    Objective:  Neurovascular status intact.  Plantar incision site is much improved.  Still a minimal area of unhealed incision on the distal aspect of the incision site is present.  No probing is present.  No redness or swelling is noted.  Overall improvement is noted on the left foot. He is also able to wear his boot again.  Assessment:  Status post wound dehiscence plantar left foot s/p neuroma excision  Plan:  Recommended finishing out the oral antibiotics and continued use of the iodosorb and a bandage.  He will watch for any areas of infection and will call if any arise.  Otherwise this should go on to heal uneventfully for him.

## 2013-11-14 ENCOUNTER — Inpatient Hospital Stay (HOSPITAL_COMMUNITY)
Admission: EM | Admit: 2013-11-14 | Discharge: 2013-12-16 | DRG: 004 | Disposition: A | Discharge status: Skilled Nursing Facility | Payer: Medicare Other | Attending: Pulmonary Disease | Admitting: Pulmonary Disease

## 2013-11-14 ENCOUNTER — Emergency Department (HOSPITAL_COMMUNITY): Payer: Medicare Other

## 2013-11-14 ENCOUNTER — Inpatient Hospital Stay (HOSPITAL_COMMUNITY): Payer: Medicare Other

## 2013-11-14 ENCOUNTER — Encounter (HOSPITAL_COMMUNITY): Payer: Self-pay | Admitting: Emergency Medicine

## 2013-11-14 DIAGNOSIS — E876 Hypokalemia: Secondary | ICD-10-CM | POA: Diagnosis present

## 2013-11-14 DIAGNOSIS — Z0189 Encounter for other specified special examinations: Secondary | ICD-10-CM

## 2013-11-14 DIAGNOSIS — Z880 Allergy status to penicillin: Secondary | ICD-10-CM | POA: Diagnosis not present

## 2013-11-14 DIAGNOSIS — E780 Pure hypercholesterolemia: Secondary | ICD-10-CM | POA: Diagnosis present

## 2013-11-14 DIAGNOSIS — J9601 Acute respiratory failure with hypoxia: Secondary | ICD-10-CM | POA: Diagnosis present

## 2013-11-14 DIAGNOSIS — B369 Superficial mycosis, unspecified: Secondary | ICD-10-CM | POA: Diagnosis not present

## 2013-11-14 DIAGNOSIS — F17210 Nicotine dependence, cigarettes, uncomplicated: Secondary | ICD-10-CM | POA: Diagnosis present

## 2013-11-14 DIAGNOSIS — E440 Moderate protein-calorie malnutrition: Secondary | ICD-10-CM | POA: Diagnosis not present

## 2013-11-14 DIAGNOSIS — A419 Sepsis, unspecified organism: Secondary | ICD-10-CM | POA: Diagnosis present

## 2013-11-14 DIAGNOSIS — E871 Hypo-osmolality and hyponatremia: Secondary | ICD-10-CM | POA: Diagnosis present

## 2013-11-14 DIAGNOSIS — E785 Hyperlipidemia, unspecified: Secondary | ICD-10-CM | POA: Diagnosis present

## 2013-11-14 DIAGNOSIS — R339 Retention of urine, unspecified: Secondary | ICD-10-CM | POA: Diagnosis not present

## 2013-11-14 DIAGNOSIS — F329 Major depressive disorder, single episode, unspecified: Secondary | ICD-10-CM | POA: Diagnosis present

## 2013-11-14 DIAGNOSIS — I10 Essential (primary) hypertension: Secondary | ICD-10-CM | POA: Diagnosis present

## 2013-11-14 DIAGNOSIS — Z886 Allergy status to analgesic agent status: Secondary | ICD-10-CM | POA: Diagnosis not present

## 2013-11-14 DIAGNOSIS — E870 Hyperosmolality and hypernatremia: Secondary | ICD-10-CM | POA: Diagnosis not present

## 2013-11-14 DIAGNOSIS — R0902 Hypoxemia: Secondary | ICD-10-CM | POA: Diagnosis present

## 2013-11-14 DIAGNOSIS — D649 Anemia, unspecified: Secondary | ICD-10-CM | POA: Diagnosis not present

## 2013-11-14 DIAGNOSIS — T380X5A Adverse effect of glucocorticoids and synthetic analogues, initial encounter: Secondary | ICD-10-CM | POA: Diagnosis not present

## 2013-11-14 DIAGNOSIS — N179 Acute kidney failure, unspecified: Secondary | ICD-10-CM | POA: Diagnosis not present

## 2013-11-14 DIAGNOSIS — J439 Emphysema, unspecified: Secondary | ICD-10-CM | POA: Diagnosis present

## 2013-11-14 DIAGNOSIS — R7989 Other specified abnormal findings of blood chemistry: Secondary | ICD-10-CM

## 2013-11-14 DIAGNOSIS — F4310 Post-traumatic stress disorder, unspecified: Secondary | ICD-10-CM | POA: Diagnosis present

## 2013-11-14 DIAGNOSIS — R451 Restlessness and agitation: Secondary | ICD-10-CM | POA: Diagnosis not present

## 2013-11-14 DIAGNOSIS — E872 Acidosis: Secondary | ICD-10-CM | POA: Diagnosis not present

## 2013-11-14 DIAGNOSIS — I493 Ventricular premature depolarization: Secondary | ICD-10-CM | POA: Diagnosis not present

## 2013-11-14 DIAGNOSIS — Z888 Allergy status to other drugs, medicaments and biological substances status: Secondary | ICD-10-CM

## 2013-11-14 DIAGNOSIS — J9801 Acute bronchospasm: Secondary | ICD-10-CM | POA: Diagnosis not present

## 2013-11-14 DIAGNOSIS — A481 Legionnaires' disease: Secondary | ICD-10-CM | POA: Diagnosis present

## 2013-11-14 DIAGNOSIS — R739 Hyperglycemia, unspecified: Secondary | ICD-10-CM | POA: Diagnosis not present

## 2013-11-14 DIAGNOSIS — R6520 Severe sepsis without septic shock: Secondary | ICD-10-CM | POA: Diagnosis present

## 2013-11-14 DIAGNOSIS — Z66 Do not resuscitate: Secondary | ICD-10-CM | POA: Diagnosis present

## 2013-11-14 DIAGNOSIS — R21 Rash and other nonspecific skin eruption: Secondary | ICD-10-CM | POA: Diagnosis not present

## 2013-11-14 DIAGNOSIS — Z452 Encounter for adjustment and management of vascular access device: Secondary | ICD-10-CM

## 2013-11-14 DIAGNOSIS — J80 Acute respiratory distress syndrome: Secondary | ICD-10-CM

## 2013-11-14 DIAGNOSIS — R778 Other specified abnormalities of plasma proteins: Secondary | ICD-10-CM

## 2013-11-14 DIAGNOSIS — J9690 Respiratory failure, unspecified, unspecified whether with hypoxia or hypercapnia: Secondary | ICD-10-CM

## 2013-11-14 DIAGNOSIS — J449 Chronic obstructive pulmonary disease, unspecified: Secondary | ICD-10-CM | POA: Diagnosis present

## 2013-11-14 DIAGNOSIS — R1312 Dysphagia, oropharyngeal phase: Secondary | ICD-10-CM | POA: Diagnosis not present

## 2013-11-14 DIAGNOSIS — Z4659 Encounter for fitting and adjustment of other gastrointestinal appliance and device: Secondary | ICD-10-CM

## 2013-11-14 DIAGNOSIS — Z930 Tracheostomy status: Secondary | ICD-10-CM

## 2013-11-14 DIAGNOSIS — D6959 Other secondary thrombocytopenia: Secondary | ICD-10-CM | POA: Diagnosis present

## 2013-11-14 DIAGNOSIS — I248 Other forms of acute ischemic heart disease: Secondary | ICD-10-CM | POA: Diagnosis present

## 2013-11-14 DIAGNOSIS — T884XXA Failed or difficult intubation, initial encounter: Secondary | ICD-10-CM

## 2013-11-14 DIAGNOSIS — J189 Pneumonia, unspecified organism: Secondary | ICD-10-CM | POA: Diagnosis present

## 2013-11-14 DIAGNOSIS — K5900 Constipation, unspecified: Secondary | ICD-10-CM | POA: Diagnosis not present

## 2013-11-14 DIAGNOSIS — J9600 Acute respiratory failure, unspecified whether with hypoxia or hypercapnia: Secondary | ICD-10-CM

## 2013-11-14 HISTORY — DX: Essential (primary) hypertension: I10

## 2013-11-14 HISTORY — DX: Panic disorder (episodic paroxysmal anxiety): F41.0

## 2013-11-14 HISTORY — DX: Pure hypercholesterolemia, unspecified: E78.00

## 2013-11-14 LAB — CBC WITH DIFFERENTIAL/PLATELET
BASOS ABS: 0 10*3/uL (ref 0.0–0.1)
BASOS PCT: 0 % (ref 0–1)
EOS ABS: 0 10*3/uL (ref 0.0–0.7)
Eosinophils Relative: 0 % (ref 0–5)
HEMATOCRIT: 36.6 % — AB (ref 39.0–52.0)
HEMOGLOBIN: 12.5 g/dL — AB (ref 13.0–17.0)
Lymphocytes Relative: 4 % — ABNORMAL LOW (ref 12–46)
Lymphs Abs: 0.6 10*3/uL — ABNORMAL LOW (ref 0.7–4.0)
MCH: 31.3 pg (ref 26.0–34.0)
MCHC: 34.2 g/dL (ref 30.0–36.0)
MCV: 91.5 fL (ref 78.0–100.0)
MONO ABS: 0.3 10*3/uL (ref 0.1–1.0)
MONOS PCT: 2 % — AB (ref 3–12)
Neutro Abs: 12.8 10*3/uL — ABNORMAL HIGH (ref 1.7–7.7)
Neutrophils Relative %: 94 % — ABNORMAL HIGH (ref 43–77)
Platelets: 126 10*3/uL — ABNORMAL LOW (ref 150–400)
RBC: 4 MIL/uL — ABNORMAL LOW (ref 4.22–5.81)
RDW: 13.7 % (ref 11.5–15.5)
WBC: 13.7 10*3/uL — ABNORMAL HIGH (ref 4.0–10.5)

## 2013-11-14 LAB — POCT I-STAT 3, ART BLOOD GAS (G3+)
Acid-base deficit: 4 mmol/L — ABNORMAL HIGH (ref 0.0–2.0)
Bicarbonate: 22.2 mEq/L (ref 20.0–24.0)
O2 SAT: 92 %
PH ART: 7.328 — AB (ref 7.350–7.450)
TCO2: 23 mmol/L (ref 0–100)
pCO2 arterial: 42.2 mmHg (ref 35.0–45.0)
pO2, Arterial: 68 mmHg — ABNORMAL LOW (ref 80.0–100.0)

## 2013-11-14 LAB — COMPREHENSIVE METABOLIC PANEL
ALBUMIN: 2.9 g/dL — AB (ref 3.5–5.2)
ALT: 31 U/L (ref 0–53)
AST: 54 U/L — ABNORMAL HIGH (ref 0–37)
Alkaline Phosphatase: 64 U/L (ref 39–117)
Anion gap: 17 — ABNORMAL HIGH (ref 5–15)
BUN: 31 mg/dL — ABNORMAL HIGH (ref 6–23)
CO2: 22 mEq/L (ref 19–32)
Calcium: 8.2 mg/dL — ABNORMAL LOW (ref 8.4–10.5)
Chloride: 96 mEq/L (ref 96–112)
Creatinine, Ser: 1.22 mg/dL (ref 0.50–1.35)
GFR calc non Af Amer: 58 mL/min — ABNORMAL LOW (ref >90)
GFR, EST AFRICAN AMERICAN: 67 mL/min — AB (ref >90)
Glucose, Bld: 101 mg/dL — ABNORMAL HIGH (ref 70–99)
POTASSIUM: 2.9 meq/L — AB (ref 3.7–5.3)
Sodium: 135 mEq/L — ABNORMAL LOW (ref 137–147)
TOTAL PROTEIN: 6.1 g/dL (ref 6.0–8.3)
Total Bilirubin: 0.5 mg/dL (ref 0.3–1.2)

## 2013-11-14 LAB — I-STAT CG4 LACTIC ACID, ED
LACTIC ACID, VENOUS: 1.94 mmol/L (ref 0.5–2.2)
Lactic Acid, Venous: 1.09 mmol/L (ref 0.5–2.2)

## 2013-11-14 LAB — TROPONIN I: TROPONIN I: 2.16 ng/mL — AB (ref <0.30)

## 2013-11-14 LAB — URINALYSIS, ROUTINE W REFLEX MICROSCOPIC
Bilirubin Urine: NEGATIVE
Glucose, UA: NEGATIVE mg/dL
Ketones, ur: NEGATIVE mg/dL
LEUKOCYTES UA: NEGATIVE
NITRITE: NEGATIVE
Protein, ur: 100 mg/dL — AB
SPECIFIC GRAVITY, URINE: 1.026 (ref 1.005–1.030)
UROBILINOGEN UA: 0.2 mg/dL (ref 0.0–1.0)
pH: 5.5 (ref 5.0–8.0)

## 2013-11-14 LAB — MRSA PCR SCREENING: MRSA BY PCR: NEGATIVE

## 2013-11-14 LAB — URINE MICROSCOPIC-ADD ON

## 2013-11-14 LAB — CBC
HCT: 37 % — ABNORMAL LOW (ref 39.0–52.0)
Hemoglobin: 12.7 g/dL — ABNORMAL LOW (ref 13.0–17.0)
MCH: 30.7 pg (ref 26.0–34.0)
MCHC: 34.3 g/dL (ref 30.0–36.0)
MCV: 89.4 fL (ref 78.0–100.0)
Platelets: 121 10*3/uL — ABNORMAL LOW (ref 150–400)
RBC: 4.14 MIL/uL — ABNORMAL LOW (ref 4.22–5.81)
RDW: 13.7 % (ref 11.5–15.5)
WBC: 9.2 10*3/uL (ref 4.0–10.5)

## 2013-11-14 LAB — I-STAT ARTERIAL BLOOD GAS, ED
Acid-base deficit: 1 mmol/L (ref 0.0–2.0)
BICARBONATE: 22.2 meq/L (ref 20.0–24.0)
O2 SAT: 95 %
PCO2 ART: 31.3 mmHg — AB (ref 35.0–45.0)
PO2 ART: 69 mmHg — AB (ref 80.0–100.0)
TCO2: 23 mmol/L (ref 0–100)
pH, Arterial: 7.46 — ABNORMAL HIGH (ref 7.350–7.450)

## 2013-11-14 LAB — CREATININE, SERUM
Creatinine, Ser: 1.13 mg/dL (ref 0.50–1.35)
GFR calc Af Amer: 74 mL/min — ABNORMAL LOW (ref >90)
GFR calc non Af Amer: 64 mL/min — ABNORMAL LOW (ref >90)

## 2013-11-14 LAB — STREP PNEUMONIAE URINARY ANTIGEN: Strep Pneumo Urinary Antigen: NEGATIVE

## 2013-11-14 LAB — CK: Total CK: 985 U/L — ABNORMAL HIGH (ref 7–232)

## 2013-11-14 LAB — MAGNESIUM: Magnesium: 1.8 mg/dL (ref 1.5–2.5)

## 2013-11-14 MED ORDER — SODIUM CHLORIDE 0.9 % IV SOLN: INTRAVENOUS | Status: DC | PRN

## 2013-11-14 MED ORDER — MIDAZOLAM HCL 2 MG/2ML IJ SOLN
2.0000 mg | Freq: Once | INTRAMUSCULAR | Status: AC
  Administered 2013-11-14: 2 mg via INTRAVENOUS

## 2013-11-14 MED ORDER — LEVALBUTEROL HCL 0.63 MG/3ML IN NEBU
0.6300 mg | INHALATION_SOLUTION | Freq: Four times a day (QID) | RESPIRATORY_TRACT | Status: DC
  Administered 2013-11-14 – 2013-11-17 (×11): 0.63 mg via RESPIRATORY_TRACT
  Filled 2013-11-14 (×23): qty 3

## 2013-11-14 MED ORDER — CHLORHEXIDINE GLUCONATE 0.12 % MT SOLN
15.0000 mL | Freq: Two times a day (BID) | OROMUCOSAL | Status: DC
  Administered 2013-11-14 – 2013-11-17 (×6): 15 mL via OROMUCOSAL
  Filled 2013-11-14 (×5): qty 15

## 2013-11-14 MED ORDER — ACETAMINOPHEN 325 MG PO TABS: 650.0000 mg | ORAL_TABLET | Freq: Four times a day (QID) | ORAL | Status: DC | PRN

## 2013-11-14 MED ORDER — FENTANYL CITRATE 0.05 MG/ML IJ SOLN
INTRAMUSCULAR | Status: AC
  Administered 2013-11-14: 100 ug via INTRAVENOUS
  Filled 2013-11-14: qty 2

## 2013-11-14 MED ORDER — ACETAMINOPHEN 500 MG PO TABS
1000.0000 mg | ORAL_TABLET | Freq: Once | ORAL | Status: AC
Start: 2013-11-14 — End: 2013-11-14
  Administered 2013-11-14: 1000 mg via ORAL
  Filled 2013-11-14: qty 2

## 2013-11-14 MED ORDER — ONDANSETRON HCL 4 MG PO TABS: 4.0000 mg | ORAL_TABLET | Freq: Four times a day (QID) | ORAL | Status: DC | PRN

## 2013-11-14 MED ORDER — ACETAMINOPHEN 650 MG RE SUPP: 650.0000 mg | Freq: Four times a day (QID) | RECTAL | Status: DC | PRN

## 2013-11-14 MED ORDER — POTASSIUM CHLORIDE CRYS ER 20 MEQ PO TBCR: 40.0000 meq | EXTENDED_RELEASE_TABLET | Freq: Once | ORAL | Status: DC

## 2013-11-14 MED ORDER — ETOMIDATE 2 MG/ML IV SOLN
40.0000 mg | Freq: Once | INTRAVENOUS | Status: AC
  Administered 2013-11-14: 40 mg via INTRAVENOUS

## 2013-11-14 MED ORDER — KCL IN DEXTROSE-NACL 40-5-0.9 MEQ/L-%-% IV SOLN
INTRAVENOUS | Status: DC
  Administered 2013-11-14: 17:00:00 via INTRAVENOUS
  Administered 2013-11-15: 100 mL via INTRAVENOUS
  Filled 2013-11-14 (×7): qty 1000

## 2013-11-14 MED ORDER — POTASSIUM CHLORIDE 10 MEQ/100ML IV SOLN
10.0000 meq | Freq: Once | INTRAVENOUS | Status: AC
  Administered 2013-11-14: 10 meq via INTRAVENOUS
  Filled 2013-11-14: qty 100

## 2013-11-14 MED ORDER — ONDANSETRON HCL 4 MG/2ML IJ SOLN: 4.0000 mg | Freq: Four times a day (QID) | INTRAMUSCULAR | Status: DC | PRN

## 2013-11-14 MED ORDER — VANCOMYCIN HCL IN DEXTROSE 750-5 MG/150ML-% IV SOLN
750.0000 mg | Freq: Two times a day (BID) | INTRAVENOUS | Status: DC
  Administered 2013-11-15 – 2013-11-16 (×4): 750 mg via INTRAVENOUS
  Filled 2013-11-14 (×6): qty 150

## 2013-11-14 MED ORDER — POTASSIUM CHLORIDE CRYS ER 20 MEQ PO TBCR
40.0000 meq | EXTENDED_RELEASE_TABLET | Freq: Once | ORAL | Status: AC
  Administered 2013-11-14: 40 meq via ORAL
  Filled 2013-11-14: qty 2

## 2013-11-14 MED ORDER — FENTANYL CITRATE 0.05 MG/ML IJ SOLN
INTRAMUSCULAR | Status: AC
  Filled 2013-11-14: qty 2

## 2013-11-14 MED ORDER — TETANUS-DIPHTH-ACELL PERTUSSIS 5-2.5-18.5 LF-MCG/0.5 IM SUSP: 0.5000 mL | Freq: Once | INTRAMUSCULAR | Status: DC

## 2013-11-14 MED ORDER — NOREPINEPHRINE BITARTRATE 1 MG/ML IV SOLN
0.0000 ug/min | INTRAVENOUS | Status: DC
  Filled 2013-11-14: qty 16

## 2013-11-14 MED ORDER — MIDAZOLAM HCL 2 MG/2ML IJ SOLN
INTRAMUSCULAR | Status: AC
  Administered 2013-11-14: 2 mg via INTRAVENOUS
  Filled 2013-11-14: qty 2

## 2013-11-14 MED ORDER — SODIUM CHLORIDE 0.9 % IV BOLUS (SEPSIS)
2000.0000 mL | Freq: Once | INTRAVENOUS | Status: AC
  Administered 2013-11-14: 2000 mL via INTRAVENOUS

## 2013-11-14 MED ORDER — ACETAMINOPHEN 160 MG/5ML PO SOLN
650.0000 mg | Freq: Four times a day (QID) | ORAL | Status: DC | PRN
  Filled 2013-11-14: qty 20.3

## 2013-11-14 MED ORDER — SODIUM CHLORIDE 0.9 % IJ SOLN
3.0000 mL | Freq: Two times a day (BID) | INTRAMUSCULAR | Status: DC
  Administered 2013-11-15 – 2013-11-18 (×7): 3 mL via INTRAVENOUS

## 2013-11-14 MED ORDER — SODIUM CHLORIDE 0.9 % IV SOLN: 250.0000 mL | INTRAVENOUS | Status: DC | PRN

## 2013-11-14 MED ORDER — SODIUM CHLORIDE 0.9 % IV SOLN
INTRAVENOUS | Status: DC
  Administered 2013-11-16 – 2013-11-17 (×2): via INTRAVENOUS

## 2013-11-14 MED ORDER — FENTANYL CITRATE 0.05 MG/ML IJ SOLN
100.0000 ug | Freq: Once | INTRAMUSCULAR | Status: AC
  Administered 2013-11-14: 100 ug via INTRAVENOUS

## 2013-11-14 MED ORDER — HEPARIN SODIUM (PORCINE) 5000 UNIT/ML IJ SOLN
5000.0000 [IU] | Freq: Three times a day (TID) | INTRAMUSCULAR | Status: DC
  Administered 2013-11-15 – 2013-12-16 (×92): 5000 [IU] via SUBCUTANEOUS
  Filled 2013-11-14 (×106): qty 1

## 2013-11-14 MED ORDER — FENTANYL BOLUS VIA INFUSION
25.0000 ug | INTRAVENOUS | Status: DC | PRN
  Administered 2013-11-17 (×2): 50 ug via INTRAVENOUS
  Filled 2013-11-14: qty 50

## 2013-11-14 MED ORDER — SODIUM CHLORIDE 0.9 % IV SOLN
0.0000 ug/h | INTRAVENOUS | Status: DC
  Administered 2013-11-14: 300 ug/h via INTRAVENOUS
  Administered 2013-11-15 – 2013-11-16 (×5): 250 ug/h via INTRAVENOUS
  Filled 2013-11-14 (×7): qty 50

## 2013-11-14 MED ORDER — IOHEXOL 350 MG/ML SOLN
100.0000 mL | Freq: Once | INTRAVENOUS | Status: AC | PRN
  Administered 2013-11-14: 100 mL via INTRAVENOUS

## 2013-11-14 MED ORDER — SODIUM CHLORIDE 0.9 % IV SOLN
10.0000 ug/h | Freq: Once | INTRAVENOUS | Status: DC
  Filled 2013-11-14: qty 50

## 2013-11-14 MED ORDER — MIDAZOLAM HCL 2 MG/2ML IJ SOLN
2.0000 mg | INTRAMUSCULAR | Status: DC | PRN
  Filled 2013-11-14: qty 2

## 2013-11-14 MED ORDER — CETYLPYRIDINIUM CHLORIDE 0.05 % MT LIQD
7.0000 mL | Freq: Four times a day (QID) | OROMUCOSAL | Status: DC
  Administered 2013-11-15 – 2013-11-17 (×10): 7 mL via OROMUCOSAL

## 2013-11-14 MED ORDER — FENTANYL CITRATE 0.05 MG/ML IJ SOLN
50.0000 ug | Freq: Once | INTRAMUSCULAR | Status: AC
  Administered 2013-11-14: 100 ug via INTRAVENOUS
  Filled 2013-11-14: qty 2

## 2013-11-14 MED ORDER — HEPARIN SODIUM (PORCINE) 5000 UNIT/ML IJ SOLN
5000.0000 [IU] | Freq: Three times a day (TID) | INTRAMUSCULAR | Status: DC
  Administered 2013-11-14: 5000 [IU] via SUBCUTANEOUS
  Filled 2013-11-14: qty 1

## 2013-11-14 MED ORDER — SODIUM CHLORIDE 0.9 % IV SOLN
0.0000 mg/h | INTRAVENOUS | Status: DC
  Administered 2013-11-14 – 2013-11-16 (×3): 2 mg/h via INTRAVENOUS
  Filled 2013-11-14 (×5): qty 10

## 2013-11-14 MED ORDER — ENOXAPARIN SODIUM 30 MG/0.3ML ~~LOC~~ SOLN: 30.0000 mg | SUBCUTANEOUS | Status: DC

## 2013-11-14 MED ORDER — VANCOMYCIN HCL IN DEXTROSE 1-5 GM/200ML-% IV SOLN
1000.0000 mg | Freq: Once | INTRAVENOUS | Status: AC
  Administered 2013-11-14: 1000 mg via INTRAVENOUS
  Filled 2013-11-14: qty 200

## 2013-11-14 MED ORDER — LEVOFLOXACIN IN D5W 750 MG/150ML IV SOLN
750.0000 mg | INTRAVENOUS | Status: AC
  Administered 2013-11-14 – 2013-12-04 (×21): 750 mg via INTRAVENOUS
  Filled 2013-11-14 (×25): qty 150

## 2013-11-14 MED ORDER — ETOMIDATE 2 MG/ML IV SOLN: 40.0000 mg/kg | Freq: Once | INTRAVENOUS | Status: DC

## 2013-11-14 MED ORDER — FENTANYL CITRATE 0.05 MG/ML IJ SOLN: 100.0000 ug | Freq: Once | INTRAMUSCULAR | Status: DC

## 2013-11-14 MED ORDER — ACETAMINOPHEN 160 MG/5ML PO SOLN
650.0000 mg | Freq: Four times a day (QID) | ORAL | Status: DC | PRN
  Administered 2013-11-14 – 2013-11-16 (×6): 650 mg
  Filled 2013-11-14 (×5): qty 20.3

## 2013-11-14 MED ORDER — ALBUTEROL SULFATE (2.5 MG/3ML) 0.083% IN NEBU: 2.5000 mg | INHALATION_SOLUTION | Freq: Four times a day (QID) | RESPIRATORY_TRACT | Status: DC

## 2013-11-14 MED ORDER — POTASSIUM CHLORIDE 10 MEQ/100ML IV SOLN
10.0000 meq | INTRAVENOUS | Status: AC
  Administered 2013-11-14 (×3): 10 meq via INTRAVENOUS
  Filled 2013-11-14 (×3): qty 100

## 2013-11-14 MED ORDER — SODIUM CHLORIDE 0.9 % IV SOLN
500.0000 mg | Freq: Three times a day (TID) | INTRAVENOUS | Status: DC
  Filled 2013-11-14: qty 500

## 2013-11-14 MED ORDER — CHLORHEXIDINE GLUCONATE 0.12 % MT SOLN
OROMUCOSAL | Status: AC
  Administered 2013-11-14: 15 mL via OROMUCOSAL
  Filled 2013-11-14: qty 15

## 2013-11-14 MED ORDER — SODIUM CHLORIDE 0.9 % IV SOLN
500.0000 mg | Freq: Once | INTRAVENOUS | Status: AC
  Administered 2013-11-14: 500 mg via INTRAVENOUS
  Filled 2013-11-14: qty 500

## 2013-11-14 NOTE — ED Notes (Signed)
Attempted report 
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
## 2013-11-14 NOTE — Progress Notes (Signed)
ABG    Component Value Date/Time   PHART 7.328* 11/14/2013 2230   PCO2ART 42.2 11/14/2013 2230   PO2ART 68.0* 11/14/2013 2230   HCO3 22.2 11/14/2013 2230   TCO2 23 11/14/2013 2230   ACIDBASEDEF 4.0* 11/14/2013 2230   O2SAT 92.0 11/14/2013 2230   100%, RR15, VT 500, +8

## 2013-11-14 NOTE — Progress Notes (Signed)
Received patient from ED.  Patient struggling to breathe with Respiratory rate 40's and O2 Sats 84-85% on 100% NRB.  Rahul, PA here in unit.  Made aware of patient's status, and will come to see patient when finished with current patient that he is seeing now.

## 2013-11-14 NOTE — ED Notes (Signed)
[employer] at bedside with patient.

## 2013-11-14 NOTE — ED Notes (Signed)
Returned from ct 

## 2013-11-14 NOTE — ED Notes (Signed)
Patient transported to CT 

## 2013-11-14 NOTE — Procedures (Signed)
Central Venous Catheter Insertion Procedure Note Rick Church [id number] 29-Mar-1940  Procedure: Insertion of Central Venous Catheter Indications: Assessment of intravascular volume, Drug and/or fluid administration and Frequent blood sampling  Procedure Details Consent: Risks of procedure as well as the alternatives and risks of each were explained to the (patient/caregiver).  Consent for procedure obtained. Time Out: Verified patient identification, verified procedure, site/side was marked, verified correct patient position, special equipment/implants available, medications/allergies/relevent history reviewed, required imaging and test results available.  Performed  Maximum sterile technique was used including antiseptics, cap, gloves, gown, hand hygiene, mask and sheet. Skin prep: Chlorhexidine; local anesthetic administered A antimicrobial bonded/coated triple lumen catheter was placed in the left internal jugular vein using the Seldinger technique.  Evaluation Blood flow good Complications: No apparent complications Patient did tolerate procedure well. Chest X-ray ordered to verify placement.  CXR: pending.  Procedure performed under direct ultrasound guidance for real time vessel cannulation.      Montey Hora, [facility] Pgr: [phone]  or [phone] 11/14/2013, 7:52 PM   Baltazar Apo, MD, PhD 11/14/2013, 8:03 PM [facility] [phone] or if no answer [phone]
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
## 2013-11-14 NOTE — Progress Notes (Signed)
PCCM Interval Progress Note  Pt continued to be very agitated and dyssynchronous with vent despite fentanyl gtt and 6mg  versed (per PRN versed orders).  PRN order discontinued and replaced with versed gtt.   Montey Hora, [facility] Pgr: [phone]  or [phone] 11/14/2013, 7:59 PM
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
## 2013-11-14 NOTE — ED Notes (Signed)
RR nurse here checking in on patient per sepsis protocol.

## 2013-11-14 NOTE — ED Notes (Signed)
Critical K 2.9 reported to Margarita Mail, [place] at [phone]
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
## 2013-11-14 NOTE — Procedures (Signed)
Intubation Procedure Note Rick Church [id number] March 28, 1940  Procedure: Intubation Indications: Respiratory insufficiency  Procedure Details Consent: Risks of procedure as well as the alternatives and risks of each were explained to the (patient/caregiver).  Consent for procedure obtained. Time Out: Verified patient identification, verified procedure, site/side was marked, verified correct patient position, special equipment/implants available, medications/allergies/relevent history reviewed, required imaging and test results available.  Performed  Drugs 369mcg fentanyl, 6mg  versed, 40mg  etomidate. DL x 1 with # 4 MAC blade. 8.0 tube passed through cords under direct visualization.  Evaluation Hemodynamic Status: BP stable throughout; O2 sats: stable throughout Patient's Current Condition: stable Complications: No apparent complications Patient did tolerate procedure well. Chest X-ray ordered to verify placement.  CXR: pending.  Rick Church, [place] Pgr: [phone]  or [phone] 11/14/2013, 7:53 PM  Baltazar Apo, MD, PhD 11/14/2013, 8:05 PM [place] [facility] [phone] or if no answer [phone]
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
## 2013-11-14 NOTE — ED Notes (Signed)
Patient returned from CT

## 2013-11-14 NOTE — ED Notes (Signed)
Hospitalist service at bedside.

## 2013-11-14 NOTE — ED Notes (Signed)
Patient had loose, brown stool, patient very dirty. Gave complete bath and changed linens.

## 2013-11-14 NOTE — ED Notes (Signed)
C-Collar removed per PA Margarita Mail

## 2013-11-14 NOTE — Progress Notes (Addendum)
CRITICAL VALUE ALERT  Critical value received:  Troponin 2.16  Date of notification:  11/14/13  Time of notification:  2310  Critical value read back: yes  Nurse who received alert:  A. Darcel Smalling RN  MD notified (1st page):  CCM MD notified. Left message with RN. Awaiting orders   Lab called- requested recollect. Recollect showed troponin to be negative. Dr. Lake Bells aware.

## 2013-11-15 ENCOUNTER — Inpatient Hospital Stay (HOSPITAL_COMMUNITY): Payer: Medicare Other

## 2013-11-15 DIAGNOSIS — J432 Centrilobular emphysema: Secondary | ICD-10-CM

## 2013-11-15 DIAGNOSIS — R778 Other specified abnormalities of plasma proteins: Secondary | ICD-10-CM | POA: Diagnosis present

## 2013-11-15 DIAGNOSIS — I359 Nonrheumatic aortic valve disorder, unspecified: Secondary | ICD-10-CM

## 2013-11-15 DIAGNOSIS — J439 Emphysema, unspecified: Secondary | ICD-10-CM | POA: Diagnosis present

## 2013-11-15 DIAGNOSIS — R7989 Other specified abnormal findings of blood chemistry: Secondary | ICD-10-CM | POA: Diagnosis present

## 2013-11-15 LAB — BASIC METABOLIC PANEL
Anion gap: 12 (ref 5–15)
BUN: 29 mg/dL — ABNORMAL HIGH (ref 6–23)
CALCIUM: 7.4 mg/dL — AB (ref 8.4–10.5)
CO2: 19 mEq/L (ref 19–32)
Chloride: 110 mEq/L (ref 96–112)
Creatinine, Ser: 0.98 mg/dL (ref 0.50–1.35)
GFR calc Af Amer: >90 mL/min (ref >90)
GFR, EST NON AFRICAN AMERICAN: 81 mL/min — AB (ref >90)
Glucose, Bld: 105 mg/dL — ABNORMAL HIGH (ref 70–99)
Potassium: 4.2 mEq/L (ref 3.7–5.3)
Sodium: 141 mEq/L (ref 137–147)

## 2013-11-15 LAB — CBC
HCT: 33.5 % — ABNORMAL LOW (ref 39.0–52.0)
Hemoglobin: 11.4 g/dL — ABNORMAL LOW (ref 13.0–17.0)
MCH: 30.6 pg (ref 26.0–34.0)
MCHC: 34 g/dL (ref 30.0–36.0)
MCV: 89.8 fL (ref 78.0–100.0)
PLATELETS: 120 10*3/uL — AB (ref 150–400)
RBC: 3.73 MIL/uL — ABNORMAL LOW (ref 4.22–5.81)
RDW: 14.1 % (ref 11.5–15.5)
WBC: 8.9 10*3/uL (ref 4.0–10.5)

## 2013-11-15 LAB — POCT I-STAT 3, ART BLOOD GAS (G3+)
ACID-BASE DEFICIT: 5 mmol/L — AB (ref 0.0–2.0)
ACID-BASE DEFICIT: 4 mmol/L — AB (ref 0.0–2.0)
BICARBONATE: 20 meq/L (ref 20.0–24.0)
Bicarbonate: 22 mEq/L (ref 20.0–24.0)
O2 SAT: 89 %
O2 Saturation: 90 %
Patient temperature: 102
TCO2: 21 mmol/L (ref 0–100)
TCO2: 23 mmol/L (ref 0–100)
pCO2 arterial: 40.6 mmHg (ref 35.0–45.0)
pCO2 arterial: 46.4 mmHg — ABNORMAL HIGH (ref 35.0–45.0)
pH, Arterial: 7.309 — ABNORMAL LOW (ref 7.350–7.450)
pH, Arterial: 7.294 — ABNORMAL LOW (ref 7.350–7.450)
pO2, Arterial: 71 mmHg — ABNORMAL LOW (ref 80.0–100.0)
pO2, Arterial: 71 mmHg — ABNORMAL LOW (ref 80.0–100.0)

## 2013-11-15 LAB — GLUCOSE, CAPILLARY
GLUCOSE-CAPILLARY: 108 mg/dL — AB (ref 70–99)
GLUCOSE-CAPILLARY: 88 mg/dL (ref 70–99)

## 2013-11-15 LAB — LEGIONELLA ANTIGEN, URINE

## 2013-11-15 LAB — URINE CULTURE
COLONY COUNT: NO GROWTH
Culture: NO GROWTH

## 2013-11-15 LAB — LACTIC ACID, PLASMA: Lactic Acid, Venous: 1 mmol/L (ref 0.5–2.2)

## 2013-11-15 LAB — INFLUENZA PANEL BY PCR (TYPE A & B)
H1N1 flu by pcr: NOT DETECTED
INFLAPCR: NEGATIVE
Influenza B By PCR: NEGATIVE

## 2013-11-15 LAB — TROPONIN I: Troponin I: <0.3 ng/mL (ref <0.30)

## 2013-11-15 MED ORDER — VITAL AF 1.2 CAL PO LIQD
1000.0000 mL | ORAL | Status: DC
  Administered 2013-11-15 – 2013-11-18 (×3): 1000 mL
  Filled 2013-11-15 (×10): qty 1000

## 2013-11-15 MED ORDER — PANTOPRAZOLE SODIUM 40 MG PO PACK
40.0000 mg | PACK | Freq: Every day | ORAL | Status: DC
  Administered 2013-11-16 – 2013-11-18 (×3): 40 mg
  Filled 2013-11-15 (×5): qty 20

## 2013-11-15 MED ORDER — INSULIN ASPART 100 UNIT/ML ~~LOC~~ SOLN
0.0000 [IU] | SUBCUTANEOUS | Status: DC
  Administered 2013-11-16: 2 [IU] via SUBCUTANEOUS
  Administered 2013-11-16: 5 [IU] via SUBCUTANEOUS
  Administered 2013-11-16: 3 [IU] via SUBCUTANEOUS
  Administered 2013-11-17 (×2): 2 [IU] via SUBCUTANEOUS
  Administered 2013-11-17 (×2): 3 [IU] via SUBCUTANEOUS
  Administered 2013-11-17: 2 [IU] via SUBCUTANEOUS
  Administered 2013-11-17: 5 [IU] via SUBCUTANEOUS
  Administered 2013-11-18: 2 [IU] via SUBCUTANEOUS
  Administered 2013-11-18: 8 [IU] via SUBCUTANEOUS
  Administered 2013-11-18 (×2): 2 [IU] via SUBCUTANEOUS
  Administered 2013-11-18 – 2013-11-19 (×3): 3 [IU] via SUBCUTANEOUS
  Administered 2013-11-19: 2 [IU] via SUBCUTANEOUS
  Administered 2013-11-19 – 2013-11-20 (×6): 3 [IU] via SUBCUTANEOUS

## 2013-11-15 MED ORDER — VITAL HIGH PROTEIN PO LIQD
1000.0000 mL | ORAL | Status: DC
  Filled 2013-11-15 (×2): qty 1000
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
## 2013-11-15 NOTE — Progress Notes (Signed)
  Echocardiogram 2D Echocardiogram has been performed.  Jakeia Carreras FRANCES 11/15/2013, 2:17 PM

## 2013-11-15 NOTE — Progress Notes (Signed)
[facility] Progress Note Patient Name: Rick Church DOB: 04-30-1940 MRN: [id number]   Date of Service  11/15/2013  HPI/Events of Note  Desaturating, vent flow curve with "double clutching" dyssynchrony pattern, wants bigger tidal volume Chest x-ray > severe consolidation and effusion left lung; does not have ARDS  eICU Interventions  Change to pressure control ventilation to allow for variation in tidal volumes for patient comfort     Intervention Category Major Interventions: Respiratory failure - evaluation and management;Hypoxemia - evaluation and management  Rick Church 11/15/2013, 12:04 AM
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
## 2013-11-15 NOTE — Progress Notes (Signed)
MD called regarding oxygenation issues and Fio2 of 100% and sats 88-90. Order was given to Paraje earlier today. MD requested to hold aline at this time for ABG draws.
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
## 2013-11-15 NOTE — Procedures (Signed)
Arterial Catheter Insertion Procedure Note Rick Church [id number] 1940-03-05  Procedure: Insertion of Arterial Catheter  Indications: Frequent blood sampling  Procedure Details Consent: Unable to obtain consent because of emergent medical necessity. Time Out: Verified patient identification, verified procedure, site/side was marked, verified correct patient position, special equipment/implants available, medications/allergies/relevent history reviewed, required imaging and test results available.  Performed  Maximum sterile technique was used including antiseptics, cap, gloves, gown, hand hygiene, mask and sheet. Skin prep: Chlorhexidine; local anesthetic administered 20 gauge catheter was inserted into right radial artery using the Seldinger technique.  Evaluation Blood flow good; BP tracing good. Complications: No apparent complications.  Good waveform noted.  [place] 11/15/2013

## 2013-11-15 NOTE — Progress Notes (Signed)
[facility] Progress Note Patient Name: Rick Church DOB: 1940/07/19 MRN: [id number]   Date of Service  11/15/2013  HPI/Events of Note    eICU Interventions  protonix for SUp     Intervention Category Intermediate Interventions: Best-practice therapies (e.g. DVT, beta blocker, etc.)  Rick Church V. 11/15/2013, 7:29 PM

## 2013-11-16 ENCOUNTER — Inpatient Hospital Stay (HOSPITAL_COMMUNITY): Payer: Medicare Other

## 2013-11-16 DIAGNOSIS — J431 Panlobular emphysema: Secondary | ICD-10-CM

## 2013-11-16 LAB — POCT I-STAT 3, ART BLOOD GAS (G3+)
ACID-BASE DEFICIT: 4 mmol/L — AB (ref 0.0–2.0)
Acid-base deficit: 5 mmol/L — ABNORMAL HIGH (ref 0.0–2.0)
Acid-base deficit: 4 mmol/L — ABNORMAL HIGH (ref 0.0–2.0)
BICARBONATE: 21.9 meq/L (ref 20.0–24.0)
BICARBONATE: 21.4 meq/L (ref 20.0–24.0)
Bicarbonate: 21.9 mEq/L (ref 20.0–24.0)
O2 SAT: 75 %
O2 SAT: 81 %
O2 Saturation: 78 %
PCO2 ART: 45.4 mmHg — AB (ref 35.0–45.0)
PCO2 ART: 46.9 mmHg — AB (ref 35.0–45.0)
PH ART: 7.273 — AB (ref 7.350–7.450)
Patient temperature: 102
Patient temperature: 102.9
TCO2: 23 mmol/L (ref 0–100)
TCO2: 23 mmol/L (ref 0–100)
TCO2: 23 mmol/L (ref 0–100)
pCO2 arterial: 47.4 mmHg — ABNORMAL HIGH (ref 35.0–45.0)
pH, Arterial: 7.302 — ABNORMAL LOW (ref 7.350–7.450)
pH, Arterial: 7.288 — ABNORMAL LOW (ref 7.350–7.450)
pO2, Arterial: 49 mmHg — ABNORMAL LOW (ref 80.0–100.0)
pO2, Arterial: 54 mmHg — ABNORMAL LOW (ref 80.0–100.0)
pO2, Arterial: 57 mmHg — ABNORMAL LOW (ref 80.0–100.0)

## 2013-11-16 LAB — BASIC METABOLIC PANEL
ANION GAP: 12 (ref 5–15)
BUN: 38 mg/dL — ABNORMAL HIGH (ref 6–23)
CHLORIDE: 108 meq/L (ref 96–112)
CO2: 21 mEq/L (ref 19–32)
Calcium: 8.1 mg/dL — ABNORMAL LOW (ref 8.4–10.5)
Creatinine, Ser: 1.13 mg/dL (ref 0.50–1.35)
GFR, EST AFRICAN AMERICAN: 74 mL/min — AB (ref >90)
GFR, EST NON AFRICAN AMERICAN: 64 mL/min — AB (ref >90)
Glucose, Bld: 103 mg/dL — ABNORMAL HIGH (ref 70–99)
POTASSIUM: 4.6 meq/L (ref 3.7–5.3)
SODIUM: 141 meq/L (ref 137–147)

## 2013-11-16 LAB — GLUCOSE, CAPILLARY
GLUCOSE-CAPILLARY: 100 mg/dL — AB (ref 70–99)
GLUCOSE-CAPILLARY: 98 mg/dL (ref 70–99)
Glucose-Capillary: 96 mg/dL (ref 70–99)
Glucose-Capillary: 202 mg/dL — ABNORMAL HIGH (ref 70–99)
Glucose-Capillary: 158 mg/dL — ABNORMAL HIGH (ref 70–99)
Glucose-Capillary: 146 mg/dL — ABNORMAL HIGH (ref 70–99)

## 2013-11-16 LAB — CBC
HCT: 33.9 % — ABNORMAL LOW (ref 39.0–52.0)
Hemoglobin: 11.1 g/dL — ABNORMAL LOW (ref 13.0–17.0)
MCH: 29.8 pg (ref 26.0–34.0)
MCHC: 32.7 g/dL (ref 30.0–36.0)
MCV: 91.1 fL (ref 78.0–100.0)
PLATELETS: 130 10*3/uL — AB (ref 150–400)
RBC: 3.72 MIL/uL — ABNORMAL LOW (ref 4.22–5.81)
RDW: 14.8 % (ref 11.5–15.5)
WBC: 11 10*3/uL — AB (ref 4.0–10.5)

## 2013-11-16 LAB — MAGNESIUM: MAGNESIUM: 2.1 mg/dL (ref 1.5–2.5)

## 2013-11-16 MED ORDER — METHYLPREDNISOLONE SODIUM SUCC 125 MG IJ SOLR
60.0000 mg | Freq: Three times a day (TID) | INTRAMUSCULAR | Status: DC
  Administered 2013-11-16 – 2013-11-19 (×10): 60 mg via INTRAVENOUS
  Filled 2013-11-16 (×2): qty 2
  Filled 2013-11-16 (×2): qty 0.96
  Filled 2013-11-16: qty 2
  Filled 2013-11-16: qty 0.96
  Filled 2013-11-16: qty 2
  Filled 2013-11-16 (×3): qty 0.96
  Filled 2013-11-16: qty 2
  Filled 2013-11-16 (×3): qty 0.96

## 2013-11-16 MED ORDER — FUROSEMIDE 10 MG/ML IJ SOLN
40.0000 mg | Freq: Two times a day (BID) | INTRAMUSCULAR | Status: DC
  Administered 2013-11-16 – 2013-11-19 (×6): 40 mg via INTRAVENOUS
  Filled 2013-11-16 (×10): qty 4

## 2013-11-16 MED ORDER — ACETAMINOPHEN 160 MG/5ML PO SOLN
650.0000 mg | Freq: Four times a day (QID) | ORAL | Status: DC | PRN
  Administered 2013-11-17 – 2013-12-10 (×8): 650 mg
  Filled 2013-11-16 (×9): qty 20.3

## 2013-11-16 MED ORDER — SODIUM CHLORIDE 0.9 % IJ SOLN
10.0000 mL | INTRAMUSCULAR | Status: DC | PRN
  Administered 2013-11-26: 10 mL
  Filled 2013-11-16: qty 40

## 2013-11-16 MED ORDER — SODIUM CHLORIDE 0.9 % IJ SOLN
10.0000 mL | Freq: Two times a day (BID) | INTRAMUSCULAR | Status: DC
  Administered 2013-11-16 – 2013-11-27 (×20): 10 mL

## 2013-11-16 MED ORDER — DEXTROSE 5 % IV SOLN
500.0000 mg | INTRAVENOUS | Status: DC
  Administered 2013-11-16 – 2013-11-18 (×3): 500 mg via INTRAVENOUS
  Filled 2013-11-16 (×3): qty 500

## 2013-11-16 MED ORDER — SODIUM CHLORIDE 0.9 % IV SOLN
INTRAVENOUS | Status: DC | PRN
  Administered 2013-11-16: 18:00:00 via INTRA_ARTERIAL
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
## 2013-11-16 NOTE — Plan of Care (Signed)
Problem: Phase I Progression Outcomes Goal: VTE prophylaxis Outcome: Completed/Met Date Met:  11/16/13 Goal: GIProphysixis Outcome: Completed/Met Date Met:  11/16/13 Goal: Oral Care per Protocol Outcome: Completed/Met Date Met:  11/16/13 Goal: HOB elevated 30 degrees Outcome: Completed/Met Date Met:  11/16/13 Goal: VAP prevention protocol initiated Outcome: Completed/Met Date Met:  11/16/13 Goal: Sedation Protocol initiated if indicated Outcome: Completed/Met Date Met:  11/16/13 Goal: Pneumonia/flu vaccination screen completed Outcome: Progressing Unable to assess at this time. Patient sedated and intubated. No family at beside currently.  Goal: Code status addressed with pt/family Outcome: Completed/Met Date Met:  11/16/13 Goal: Pain controlled with appropriate interventions Outcome: Completed/Met Date Met:  11/16/13 Goal: Hemodynamically stable Outcome: Progressing Goal: Baseline oxygen/pH stable Outcome: Not Progressing Continues to have oxygenation issues. Vent changes per MD and RT to optimize oxygenation.  Goal: Patient tolerating nututrition at goal Outcome: Progressing Nutrition initiated 11/6. Increasing feed q 4 hours as tolerated.

## 2013-11-16 NOTE — Progress Notes (Signed)
Updated family on status of patient and explained to them the current situation. RN had to repeatedly explain how the MD will be updating them more thoroughly as we further evaluate patient status over the next week. Patient's niece expressed concern that patient would not want to intubated long-term, and says that he has a living will at home addressing his code status. Explained to her that documentation will be need to brought into the hospital and put on record. Will continue to monitor. Richardean Sale, RN

## 2013-11-16 NOTE — Progress Notes (Signed)
Spoke with Dr. Halford Chessman regarding ABG results. Order received to increased PEEP to 12. Will recheck ABG in 1 hour. Will continue to closely monitor. Richarda Blade RN

## 2013-11-16 NOTE — Progress Notes (Signed)
Sputum Legionella sent down after collected by RT Elmyra Ricks. Richardean Sale, RN

## 2013-11-16 NOTE — Progress Notes (Signed)
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  He has severe disease.   eICU Interventions   Will continue levaquin and add zithromax for double coverage.      Intervention Category Major Interventions: Other:  Dajsha Massaro 11/16/2013, 5:37 AM

## 2013-11-16 NOTE — Progress Notes (Signed)
Spoke with Dr. Halford Chessman regarding follow-up ABG results. No new orders at this time. Clarified O2 sat goal- goal to be 90%, not 85%. Will continue to closely monitor.  Richarda Blade RN

## 2013-11-16 NOTE — Progress Notes (Signed)
Spoke with Dr. Elsworth Soho regarding O2 sats and auto-peeping on ventilator. O2 sats maintaining 83-89% on 100% FiO2. RT at bedside to recruit. ABG done, and results reported to Dr. Elsworth Soho. Per MD, ABG ok, no vent changes at this time. Also per MD, O2 sat >85% is acceptable. Will continue to closely monitor. Richarda Blade RN

## 2013-11-17 ENCOUNTER — Inpatient Hospital Stay (HOSPITAL_COMMUNITY): Payer: Medicare Other

## 2013-11-17 DIAGNOSIS — J439 Emphysema, unspecified: Secondary | ICD-10-CM

## 2013-11-17 DIAGNOSIS — A481 Legionnaires' disease: Secondary | ICD-10-CM | POA: Diagnosis present

## 2013-11-17 DIAGNOSIS — J80 Acute respiratory distress syndrome: Secondary | ICD-10-CM | POA: Diagnosis present

## 2013-11-17 LAB — POCT I-STAT 3, ART BLOOD GAS (G3+)
ACID-BASE DEFICIT: 1 mmol/L (ref 0.0–2.0)
Acid-base deficit: 3 mmol/L — ABNORMAL HIGH (ref 0.0–2.0)
Acid-base deficit: 4 mmol/L — ABNORMAL HIGH (ref 0.0–2.0)
Acid-base deficit: 2 mmol/L (ref 0.0–2.0)
BICARBONATE: 26.6 meq/L — AB (ref 20.0–24.0)
BICARBONATE: 26.1 meq/L — AB (ref 20.0–24.0)
BICARBONATE: 27.7 meq/L — AB (ref 20.0–24.0)
Bicarbonate: 25.7 mEq/L — ABNORMAL HIGH (ref 20.0–24.0)
O2 SAT: 89 %
O2 Saturation: 83 %
O2 Saturation: 82 %
O2 Saturation: 90 %
PCO2 ART: 53.6 mmHg — AB (ref 35.0–45.0)
PCO2 ART: 68.7 mmHg — AB (ref 35.0–45.0)
PH ART: 7.293 — AB (ref 7.350–7.450)
PH ART: 7.211 — AB (ref 7.350–7.450)
PO2 ART: 67 mmHg — AB (ref 80.0–100.0)
Patient temperature: 100.6
Patient temperature: 97
Patient temperature: 97.8
TCO2: 27 mmol/L (ref 0–100)
TCO2: 29 mmol/L (ref 0–100)
TCO2: 28 mmol/L (ref 0–100)
TCO2: 30 mmol/L (ref 0–100)
pCO2 arterial: 65.7 mmHg (ref 35.0–45.0)
pCO2 arterial: 68.6 mmHg (ref 35.0–45.0)
pH, Arterial: 7.183 — CL (ref 7.350–7.450)
pH, Arterial: 7.212 — ABNORMAL LOW (ref 7.350–7.450)
pO2, Arterial: 57 mmHg — ABNORMAL LOW (ref 80.0–100.0)
pO2, Arterial: 56 mmHg — ABNORMAL LOW (ref 80.0–100.0)
pO2, Arterial: 70 mmHg — ABNORMAL LOW (ref 80.0–100.0)

## 2013-11-17 LAB — PHOSPHORUS: PHOSPHORUS: 2.9 mg/dL (ref 2.3–4.6)

## 2013-11-17 LAB — GLUCOSE, CAPILLARY
GLUCOSE-CAPILLARY: 128 mg/dL — AB (ref 70–99)
GLUCOSE-CAPILLARY: 144 mg/dL — AB (ref 70–99)
Glucose-Capillary: 119 mg/dL — ABNORMAL HIGH (ref 70–99)
Glucose-Capillary: 141 mg/dL — ABNORMAL HIGH (ref 70–99)
Glucose-Capillary: 160 mg/dL — ABNORMAL HIGH (ref 70–99)
Glucose-Capillary: 181 mg/dL — ABNORMAL HIGH (ref 70–99)
Glucose-Capillary: 221 mg/dL — ABNORMAL HIGH (ref 70–99)

## 2013-11-17 LAB — BLOOD GAS, ARTERIAL
ACID-BASE DEFICIT: 4.1 mmol/L — AB (ref 0.0–2.0)
Acid-base deficit: 0.2 mmol/L (ref 0.0–2.0)
BICARBONATE: 22.9 meq/L (ref 20.0–24.0)
Bicarbonate: 26.4 mEq/L — ABNORMAL HIGH (ref 20.0–24.0)
DRAWN BY: 41308
Drawn by: 41308
FIO2: 100 %
FIO2: 100 %
LHR: 35 {breaths}/min
MECHVT: 460 mL
O2 Saturation: 91.9 %
O2 Saturation: 89.7 %
PCO2 ART: 61.9 mmHg — AB (ref 35.0–45.0)
PCO2 ART: 64.2 mmHg — AB (ref 35.0–45.0)
PEEP/CPAP: 14 cmH2O
PEEP: 14 cmH2O
PH ART: 7.195 — AB (ref 7.350–7.450)
PH ART: 7.237 — AB (ref 7.350–7.450)
PO2 ART: 62.4 mmHg — AB (ref 80.0–100.0)
Patient temperature: 99.3
Patient temperature: 98.6
RATE: 35 resp/min
TCO2: 24.7 mmol/L (ref 0–100)
TCO2: 28.4 mmol/L (ref 0–100)
VT: 400 mL
pO2, Arterial: 74.2 mmHg — ABNORMAL LOW (ref 80.0–100.0)

## 2013-11-17 LAB — CBC
HCT: 32.4 % — ABNORMAL LOW (ref 39.0–52.0)
Hemoglobin: 11 g/dL — ABNORMAL LOW (ref 13.0–17.0)
MCH: 30.8 pg (ref 26.0–34.0)
MCHC: 34 g/dL (ref 30.0–36.0)
MCV: 90.8 fL (ref 78.0–100.0)
PLATELETS: 158 10*3/uL (ref 150–400)
RBC: 3.57 MIL/uL — AB (ref 4.22–5.81)
RDW: 15.4 % (ref 11.5–15.5)
WBC: 14.7 10*3/uL — AB (ref 4.0–10.5)

## 2013-11-17 LAB — BASIC METABOLIC PANEL
ANION GAP: 12 (ref 5–15)
ANION GAP: 12 (ref 5–15)
BUN: 47 mg/dL — ABNORMAL HIGH (ref 6–23)
BUN: 52 mg/dL — AB (ref 6–23)
CHLORIDE: 107 meq/L (ref 96–112)
CO2: 24 meq/L (ref 19–32)
CO2: 25 mEq/L (ref 19–32)
Calcium: 8.9 mg/dL (ref 8.4–10.5)
Calcium: 9.2 mg/dL (ref 8.4–10.5)
Chloride: 109 mEq/L (ref 96–112)
Creatinine, Ser: 1.28 mg/dL (ref 0.50–1.35)
Creatinine, Ser: 1.16 mg/dL (ref 0.50–1.35)
GFR calc Af Amer: 63 mL/min — ABNORMAL LOW (ref >90)
GFR calc Af Amer: 71 mL/min — ABNORMAL LOW (ref >90)
GFR calc non Af Amer: 55 mL/min — ABNORMAL LOW (ref >90)
GFR calc non Af Amer: 62 mL/min — ABNORMAL LOW (ref >90)
Glucose, Bld: 154 mg/dL — ABNORMAL HIGH (ref 70–99)
Glucose, Bld: 156 mg/dL — ABNORMAL HIGH (ref 70–99)
POTASSIUM: 4.2 meq/L (ref 3.7–5.3)
Potassium: 4.3 mEq/L (ref 3.7–5.3)
SODIUM: 143 meq/L (ref 137–147)
Sodium: 146 mEq/L (ref 137–147)

## 2013-11-17 LAB — PROCALCITONIN: PROCALCITONIN: 3.12 ng/mL

## 2013-11-17 LAB — MAGNESIUM
Magnesium: 2.2 mg/dL (ref 1.5–2.5)
Magnesium: 2.3 mg/dL (ref 1.5–2.5)

## 2013-11-17 LAB — TROPONIN I: Troponin I: <0.3 ng/mL (ref <0.30)

## 2013-11-17 LAB — CK TOTAL AND CKMB (NOT AT ARMC)
CK TOTAL: 274 U/L — AB (ref 7–232)
CK, MB: 5.4 ng/mL — ABNORMAL HIGH (ref 0.3–4.0)
Relative Index: 2 (ref 0.0–2.5)

## 2013-11-17 LAB — CLOSTRIDIUM DIFFICILE BY PCR: Toxigenic C. Difficile by PCR: NEGATIVE

## 2013-11-17 LAB — PRO B NATRIURETIC PEPTIDE: PRO B NATRI PEPTIDE: 1388 pg/mL — AB (ref 0–125)

## 2013-11-17 LAB — LACTIC ACID, PLASMA: Lactic Acid, Venous: 1.1 mmol/L (ref 0.5–2.2)

## 2013-11-17 MED ORDER — FENTANYL BOLUS VIA INFUSION
50.0000 ug | INTRAVENOUS | Status: DC | PRN
  Administered 2013-11-17 – 2013-11-19 (×5): 50 ug via INTRAVENOUS
  Filled 2013-11-17: qty 50

## 2013-11-17 MED ORDER — MIDAZOLAM HCL 5 MG/ML IJ SOLN
2.0000 mg | Freq: Once | INTRAMUSCULAR | Status: AC | PRN
Start: 2013-11-17 — End: 2013-11-17

## 2013-11-17 MED ORDER — MIDAZOLAM HCL 5 MG/ML IJ SOLN
1.0000 mg/h | INTRAMUSCULAR | Status: DC
  Administered 2013-11-17: 6 mg/h via INTRAVENOUS
  Administered 2013-11-17: 3 mg/h via INTRAVENOUS
  Filled 2013-11-17: qty 10

## 2013-11-17 MED ORDER — SODIUM CHLORIDE 0.9 % IV SOLN
0.5000 ug/kg/min | INTRAVENOUS | Status: DC
  Administered 2013-11-17: 3 ug/kg/min via INTRAVENOUS
  Administered 2013-11-18: 2 ug/kg/min via INTRAVENOUS
  Administered 2013-11-19: 2.5 ug/kg/min via INTRAVENOUS
  Filled 2013-11-17 (×3): qty 20

## 2013-11-17 MED ORDER — MIDAZOLAM HCL 5 MG/ML IJ SOLN
2.0000 mg | Freq: Once | INTRAMUSCULAR | Status: AC
  Administered 2013-11-17: 2 mg via INTRAVENOUS

## 2013-11-17 MED ORDER — SODIUM CHLORIDE 0.9 % IV SOLN
25.0000 ug/h | INTRAVENOUS | Status: DC
  Administered 2013-11-17 – 2013-11-26 (×23): 300 ug/h via INTRAVENOUS
  Administered 2013-11-27: 25 ug/h via INTRAVENOUS
  Administered 2013-11-27: 250 ug/h via INTRAVENOUS
  Administered 2013-11-28: 200 ug/h via INTRAVENOUS
  Filled 2013-11-17 (×28): qty 50

## 2013-11-17 MED ORDER — LEVALBUTEROL HCL 0.63 MG/3ML IN NEBU: 0.6300 mg | INHALATION_SOLUTION | Freq: Four times a day (QID) | RESPIRATORY_TRACT | Status: DC | PRN

## 2013-11-17 MED ORDER — PROPOFOL 10 MG/ML IV EMUL
5.0000 ug/kg/min | INTRAVENOUS | Status: DC
  Administered 2013-11-17: 50 ug/kg/min via INTRAVENOUS
  Administered 2013-11-18: 30 ug/kg/min via INTRAVENOUS
  Administered 2013-11-18: 50 ug/kg/min via INTRAVENOUS
  Administered 2013-11-18: 30 ug/kg/min via INTRAVENOUS
  Administered 2013-11-18: 40 ug/kg/min via INTRAVENOUS
  Administered 2013-11-19 (×2): 70 ug/kg/min via INTRAVENOUS
  Administered 2013-11-19 (×4): 50 ug/kg/min via INTRAVENOUS
  Administered 2013-11-20: 70 ug/kg/min via INTRAVENOUS
  Administered 2013-11-20: 50 ug/kg/min via INTRAVENOUS
  Filled 2013-11-17 (×13): qty 100

## 2013-11-17 MED ORDER — CHLORHEXIDINE GLUCONATE 0.12 % MT SOLN
15.0000 mL | Freq: Two times a day (BID) | OROMUCOSAL | Status: DC
  Administered 2013-11-17 – 2013-12-15 (×56): 15 mL via OROMUCOSAL
  Filled 2013-11-17 (×58): qty 15

## 2013-11-17 MED ORDER — ARTIFICIAL TEARS OP OINT
1.0000 "application " | TOPICAL_OINTMENT | Freq: Three times a day (TID) | OPHTHALMIC | Status: DC
  Administered 2013-11-17 – 2013-11-19 (×6): 1 via OPHTHALMIC
  Filled 2013-11-17: qty 3.5

## 2013-11-17 MED ORDER — METRONIDAZOLE 50 MG/ML ORAL SUSPENSION
500.0000 mg | Freq: Three times a day (TID) | ORAL | Status: DC
  Administered 2013-11-17: 500 mg via ORAL
  Filled 2013-11-17 (×2): qty 10

## 2013-11-17 MED ORDER — FENTANYL CITRATE 0.05 MG/ML IJ SOLN
100.0000 ug | Freq: Once | INTRAMUSCULAR | Status: AC
  Administered 2013-11-17: 100 ug via INTRAVENOUS
  Filled 2013-11-17: qty 2

## 2013-11-17 MED ORDER — CETYLPYRIDINIUM CHLORIDE 0.05 % MT LIQD
7.0000 mL | Freq: Four times a day (QID) | OROMUCOSAL | Status: DC
  Administered 2013-11-17 – 2013-12-15 (×111): 7 mL via OROMUCOSAL

## 2013-11-17 MED ORDER — IPRATROPIUM-ALBUTEROL 0.5-2.5 (3) MG/3ML IN SOLN
3.0000 mL | RESPIRATORY_TRACT | Status: DC
  Administered 2013-11-17 – 2013-11-19 (×12): 3 mL via RESPIRATORY_TRACT
  Filled 2013-11-17 (×12): qty 3

## 2013-11-17 MED ORDER — FENTANYL CITRATE 0.05 MG/ML IJ SOLN: 100.0000 ug | INTRAMUSCULAR | Status: DC | PRN

## 2013-11-17 MED ORDER — MIDAZOLAM BOLUS VIA INFUSION
2.0000 mg | INTRAVENOUS | Status: DC | PRN
  Administered 2013-11-17: 2 mg via INTRAVENOUS
  Filled 2013-11-17 (×2): qty 2

## 2013-11-17 NOTE — Progress Notes (Signed)
Pt had some rhythm changes, elink called, order for labs, MD aware of critical ABG as well

## 2013-11-17 NOTE — Progress Notes (Signed)
Per MD once paralytic is started on patient, we will begin ARDS protocol. ABG 30 after changes are made.

## 2013-11-17 NOTE — Progress Notes (Signed)
[facility] Progress Note Patient Name: Rick Church DOB: 02/11/1940 MRN: [id number]   Date of Service  11/17/2013  HPI/Events of Note  ABG=7.19/61.9/74.2 Worsening resp acidosis and met acidosis Inc MAP (102), on max dose of versed  eICU Interventions  Inc to 7cc/kg, change versed to profolol, check LA and troponins     Intervention Category Major Interventions: Acid-Base disturbance - evaluation and management  Anavi Branscum 11/17/2013, 9:19 PM

## 2013-11-17 NOTE — Progress Notes (Signed)
Per MD to leave patient at 7cc and increase RR to 24 and get ABG in 2 hours after change.

## 2013-11-17 NOTE — Progress Notes (Signed)
RN called , patient SPO2 in the 70's. Lung Recruitment Maneuver done. SPO2 95%. RT will continue to monitor.

## 2013-11-17 NOTE — Progress Notes (Signed)
Critical ABG results reported to RN. RN notifiying MD.   pH 7.18 Co2 68.7 O2- 56 HCO2- 26.1

## 2013-11-17 NOTE — Progress Notes (Signed)
Discussed pt situation (BP, sedation, paralytic, TFs) with elink Rn, Elzie Rings, who will speak with Dr. Sharlene Dory.

## 2013-11-17 NOTE — Progress Notes (Signed)
Vent settings adjusted per ARDS protocol. RR to 35 and VT to 4cc.

## 2013-11-17 NOTE — Progress Notes (Signed)
VT increased from 400 to 460 to achieve 7cc/kg per verbal order from Dr.Mungal after most recent ABG results.

## 2013-11-17 NOTE — Progress Notes (Signed)
Pt desated again to low 80's. Lung Recruitment Maneuver done again. Pt SPO2 now at 94%. Per MD PEEP increased from 12 to 14. FIO2 is still 100%.

## 2013-11-17 NOTE — Progress Notes (Signed)
Critical ABG results reported to RN.  

## 2013-11-17 NOTE — Progress Notes (Signed)
1. Nurse thinks patient has c diff   - await c diff PCR  - start empiric flagyl   2. Latest abg is on 8cc with RR 16 on ARDS NET  Recent Labs Lab 11/16/13 0337 11/16/13 0439 11/16/13 1157 11/17/13 0414 11/17/13 1242  PHART 7.302* 7.273* 7.288* 7.293* 7.211*  PCO2ART 45.4* 47.4* 46.9* 53.6* 65.7*  PO2ART 49.0* 54.0* 57.0* 67.0* 57.0*  HCO3 21.9 21.4 21.9 25.7* 26.6*  TCO2 23 23 23 27 29   O2SAT 75.0 78.0 81.0 89.0 83.0   Increase RR to 20-24 and down to 7cc/kg/IBW Recheck abg   Dr. Brand Males, M.D., [facility].C.P Pulmonary and Critical Care Medicine Staff Physician [place] [facility] Pager: [phone], If no answer or between  15:00h - 7:00h: call [phone]  11/17/2013 2:03 PM
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
## 2013-11-17 NOTE — Progress Notes (Signed)
Pt desat into low 80's. Lung Recruitment Maneuver done on patient. Tolerated well. SPO2 now 93% All vitals stable.
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
## 2013-11-17 NOTE — Progress Notes (Signed)
RN notified of critical ABG results. RN will notify MD.

## 2013-11-18 ENCOUNTER — Inpatient Hospital Stay (HOSPITAL_COMMUNITY): Payer: Medicare Other

## 2013-11-18 DIAGNOSIS — N179 Acute kidney failure, unspecified: Secondary | ICD-10-CM

## 2013-11-18 DIAGNOSIS — A481 Legionnaires' disease: Secondary | ICD-10-CM

## 2013-11-18 LAB — POCT I-STAT 3, ART BLOOD GAS (G3+)
ACID-BASE EXCESS: 1 mmol/L (ref 0.0–2.0)
Acid-base deficit: 1 mmol/L (ref 0.0–2.0)
Bicarbonate: 26.8 mEq/L — ABNORMAL HIGH (ref 20.0–24.0)
Bicarbonate: 28.2 mEq/L — ABNORMAL HIGH (ref 20.0–24.0)
O2 Saturation: 91 %
O2 Saturation: 92 %
PO2 ART: 77 mmHg — AB (ref 80.0–100.0)
Patient temperature: 99.2
TCO2: 29 mmol/L (ref 0–100)
TCO2: 30 mmol/L (ref 0–100)
pCO2 arterial: 64 mmHg (ref 35.0–45.0)
pCO2 arterial: 57.5 mmHg (ref 35.0–45.0)
pH, Arterial: 7.231 — ABNORMAL LOW (ref 7.350–7.450)
pH, Arterial: 7.297 — ABNORMAL LOW (ref 7.350–7.450)
pO2, Arterial: 72 mmHg — ABNORMAL LOW (ref 80.0–100.0)

## 2013-11-18 LAB — CBC WITH DIFFERENTIAL/PLATELET
BASOS PCT: 0 % (ref 0–1)
Basophils Absolute: 0 10*3/uL (ref 0.0–0.1)
EOS ABS: 0 10*3/uL (ref 0.0–0.7)
EOS PCT: 0 % (ref 0–5)
HEMATOCRIT: 32.3 % — AB (ref 39.0–52.0)
Hemoglobin: 10.5 g/dL — ABNORMAL LOW (ref 13.0–17.0)
Lymphocytes Relative: 3 % — ABNORMAL LOW (ref 12–46)
Lymphs Abs: 0.4 10*3/uL — ABNORMAL LOW (ref 0.7–4.0)
MCH: 31.1 pg (ref 26.0–34.0)
MCHC: 32.5 g/dL (ref 30.0–36.0)
MCV: 95.6 fL (ref 78.0–100.0)
MONO ABS: 0.3 10*3/uL (ref 0.1–1.0)
MONOS PCT: 2 % — AB (ref 3–12)
Neutro Abs: 12.8 10*3/uL — ABNORMAL HIGH (ref 1.7–7.7)
Neutrophils Relative %: 95 % — ABNORMAL HIGH (ref 43–77)
Platelets: 196 10*3/uL (ref 150–400)
RBC: 3.38 MIL/uL — ABNORMAL LOW (ref 4.22–5.81)
RDW: 16.3 % — ABNORMAL HIGH (ref 11.5–15.5)
WBC: 13.6 10*3/uL — ABNORMAL HIGH (ref 4.0–10.5)

## 2013-11-18 LAB — CULTURE, RESPIRATORY W GRAM STAIN

## 2013-11-18 LAB — BLOOD GAS, ARTERIAL
Acid-base deficit: 0.8 mmol/L (ref 0.0–2.0)
BICARBONATE: 25.7 meq/L — AB (ref 20.0–24.0)
Drawn by: 41308
FIO2: 100 %
MECHVT: 460 mL
O2 Saturation: 94.4 %
PEEP/CPAP: 14 cmH2O
PO2 ART: 74.8 mmHg — AB (ref 80.0–100.0)
Patient temperature: 98.6
RATE: 35 resp/min
TCO2: 27.6 mmol/L (ref 0–100)
pCO2 arterial: 61.3 mmHg (ref 35.0–45.0)
pH, Arterial: 7.246 — ABNORMAL LOW (ref 7.350–7.450)

## 2013-11-18 LAB — GLUCOSE, CAPILLARY
Glucose-Capillary: 132 mg/dL — ABNORMAL HIGH (ref 70–99)
Glucose-Capillary: 137 mg/dL — ABNORMAL HIGH (ref 70–99)
Glucose-Capillary: 141 mg/dL — ABNORMAL HIGH (ref 70–99)
Glucose-Capillary: 179 mg/dL — ABNORMAL HIGH (ref 70–99)
Glucose-Capillary: 181 mg/dL — ABNORMAL HIGH (ref 70–99)
Glucose-Capillary: 273 mg/dL — ABNORMAL HIGH (ref 70–99)

## 2013-11-18 LAB — PHOSPHORUS: Phosphorus: 3.7 mg/dL (ref 2.3–4.6)

## 2013-11-18 LAB — BASIC METABOLIC PANEL
Anion gap: 11 (ref 5–15)
BUN: 59 mg/dL — ABNORMAL HIGH (ref 6–23)
CHLORIDE: 109 meq/L (ref 96–112)
CO2: 26 mEq/L (ref 19–32)
Calcium: 9 mg/dL (ref 8.4–10.5)
Creatinine, Ser: 1.31 mg/dL (ref 0.50–1.35)
GFR, EST AFRICAN AMERICAN: 62 mL/min — AB (ref >90)
GFR, EST NON AFRICAN AMERICAN: 53 mL/min — AB (ref >90)
Glucose, Bld: 200 mg/dL — ABNORMAL HIGH (ref 70–99)
POTASSIUM: 4 meq/L (ref 3.7–5.3)
SODIUM: 146 meq/L (ref 137–147)

## 2013-11-18 LAB — PROTIME-INR
INR: 1.21 (ref 0.00–1.49)
PROTHROMBIN TIME: 15.5 s — AB (ref 11.6–15.2)

## 2013-11-18 LAB — LACTIC ACID, PLASMA: LACTIC ACID, VENOUS: 2 mmol/L (ref 0.5–2.2)

## 2013-11-18 LAB — MAGNESIUM: Magnesium: 2.3 mg/dL (ref 1.5–2.5)

## 2013-11-18 LAB — PROCALCITONIN: Procalcitonin: 1.81 ng/mL

## 2013-11-18 MED ORDER — FREE WATER
200.0000 mL | Status: DC
  Administered 2013-11-18 – 2013-11-26 (×47): 200 mL
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
## 2013-11-18 NOTE — Progress Notes (Deleted)
NIF -28 and FVC 1.3L 
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
## 2013-11-18 NOTE — Progress Notes (Addendum)
68ml of Versed gtt wasted, rinsed down sink, witnessed by 2nd RN, Reinaldo Berber RN, Lorrin Jackson RN Witnessed wasted 40 ml of versed Antony Haste Lecia Esperanza rn

## 2013-11-19 ENCOUNTER — Inpatient Hospital Stay (HOSPITAL_COMMUNITY): Payer: Medicare Other

## 2013-11-19 LAB — COMPREHENSIVE METABOLIC PANEL
ALBUMIN: 1.9 g/dL — AB (ref 3.5–5.2)
ALT: 37 U/L (ref 0–53)
ANION GAP: 13 (ref 5–15)
AST: 45 U/L — AB (ref 0–37)
Alkaline Phosphatase: 65 U/L (ref 39–117)
BUN: 69 mg/dL — ABNORMAL HIGH (ref 6–23)
CO2: 27 meq/L (ref 19–32)
CREATININE: 1.23 mg/dL (ref 0.50–1.35)
Calcium: 9 mg/dL (ref 8.4–10.5)
Chloride: 110 mEq/L (ref 96–112)
GFR calc Af Amer: 66 mL/min — ABNORMAL LOW (ref >90)
GFR calc non Af Amer: 57 mL/min — ABNORMAL LOW (ref >90)
Glucose, Bld: 151 mg/dL — ABNORMAL HIGH (ref 70–99)
Potassium: 4.3 mEq/L (ref 3.7–5.3)
Sodium: 150 mEq/L — ABNORMAL HIGH (ref 137–147)
Total Bilirubin: <0.2 mg/dL — ABNORMAL LOW (ref 0.3–1.2)
Total Protein: 5.3 g/dL — ABNORMAL LOW (ref 6.0–8.3)

## 2013-11-19 LAB — BLOOD GAS, ARTERIAL
Acid-Base Excess: 1.3 mmol/L (ref 0.0–2.0)
BICARBONATE: 26.8 meq/L — AB (ref 20.0–24.0)
Drawn by: 41308
FIO2: 0.7 %
MECHVT: 460 mL
O2 SAT: 90.6 %
PCO2 ART: 54.4 mmHg — AB (ref 35.0–45.0)
PEEP/CPAP: 14 cmH2O
PO2 ART: 66 mmHg — AB (ref 80.0–100.0)
Patient temperature: 98.6
RATE: 35 resp/min
TCO2: 28.5 mmol/L (ref 0–100)
pH, Arterial: 7.314 — ABNORMAL LOW (ref 7.350–7.450)

## 2013-11-19 LAB — CBC WITH DIFFERENTIAL/PLATELET
BASOS ABS: 0 10*3/uL (ref 0.0–0.1)
BASOS PCT: 0 % (ref 0–1)
Eosinophils Absolute: 0 10*3/uL (ref 0.0–0.7)
Eosinophils Relative: 0 % (ref 0–5)
HEMATOCRIT: 32.2 % — AB (ref 39.0–52.0)
Hemoglobin: 10.7 g/dL — ABNORMAL LOW (ref 13.0–17.0)
Lymphocytes Relative: 3 % — ABNORMAL LOW (ref 12–46)
Lymphs Abs: 0.5 10*3/uL — ABNORMAL LOW (ref 0.7–4.0)
MCH: 30.5 pg (ref 26.0–34.0)
MCHC: 33.2 g/dL (ref 30.0–36.0)
MCV: 91.7 fL (ref 78.0–100.0)
MONO ABS: 0.5 10*3/uL (ref 0.1–1.0)
Monocytes Relative: 3 % (ref 3–12)
Neutro Abs: 14.8 10*3/uL — ABNORMAL HIGH (ref 1.7–7.7)
Neutrophils Relative %: 94 % — ABNORMAL HIGH (ref 43–77)
Platelets: 223 10*3/uL (ref 150–400)
RBC: 3.51 MIL/uL — ABNORMAL LOW (ref 4.22–5.81)
RDW: 16.6 % — AB (ref 11.5–15.5)
WBC: 15.8 10*3/uL — ABNORMAL HIGH (ref 4.0–10.5)

## 2013-11-19 LAB — GLUCOSE, CAPILLARY
GLUCOSE-CAPILLARY: 154 mg/dL — AB (ref 70–99)
Glucose-Capillary: 134 mg/dL — ABNORMAL HIGH (ref 70–99)
Glucose-Capillary: 165 mg/dL — ABNORMAL HIGH (ref 70–99)

## 2013-11-19 LAB — PROCALCITONIN: PROCALCITONIN: 0.89 ng/mL

## 2013-11-19 LAB — TRIGLYCERIDES: TRIGLYCERIDES: 486 mg/dL — AB (ref <150)

## 2013-11-19 MED ORDER — IPRATROPIUM-ALBUTEROL 0.5-2.5 (3) MG/3ML IN SOLN
3.0000 mL | Freq: Four times a day (QID) | RESPIRATORY_TRACT | Status: DC
  Administered 2013-11-19 – 2013-11-29 (×40): 3 mL via RESPIRATORY_TRACT
  Filled 2013-11-19 (×39): qty 3

## 2013-11-19 MED ORDER — VITAL AF 1.2 CAL PO LIQD
1000.0000 mL | ORAL | Status: DC
  Administered 2013-11-19: 1000 mL
  Filled 2013-11-19 (×2): qty 1000

## 2013-11-19 MED ORDER — HYDRALAZINE HCL 20 MG/ML IJ SOLN
10.0000 mg | INTRAMUSCULAR | Status: DC | PRN
Start: 2013-11-19 — End: 2013-12-09
  Administered 2013-11-21: 20 mg via INTRAVENOUS
  Administered 2013-11-21: 10 mg via INTRAVENOUS
  Filled 2013-11-19 (×2): qty 1

## 2013-11-19 MED ORDER — FENTANYL CITRATE 0.05 MG/ML IJ SOLN: 25.0000 ug | INTRAMUSCULAR | Status: DC | PRN

## 2013-11-19 MED ORDER — PRO-STAT SUGAR FREE PO LIQD
60.0000 mL | Freq: Two times a day (BID) | ORAL | Status: DC
  Administered 2013-11-19 – 2013-11-25 (×13): 60 mL
  Filled 2013-11-19 (×14): qty 60

## 2013-11-19 MED ORDER — METHYLPREDNISOLONE SODIUM SUCC 40 MG IJ SOLR
40.0000 mg | Freq: Two times a day (BID) | INTRAMUSCULAR | Status: DC
  Administered 2013-11-19 – 2013-11-20 (×2): 40 mg via INTRAVENOUS
  Filled 2013-11-19 (×4): qty 1

## 2013-11-19 MED ORDER — VITAL AF 1.2 CAL PO LIQD
1000.0000 mL | ORAL | Status: DC
  Administered 2013-11-19 – 2013-12-12 (×21): 1000 mL
  Filled 2013-11-19 (×33): qty 1000

## 2013-11-19 MED ORDER — MIDAZOLAM BOLUS VIA INFUSION
2.0000 mg | INTRAVENOUS | Status: DC | PRN
  Filled 2013-11-19: qty 2

## 2013-11-19 MED ORDER — DEXTROSE 5 % IV SOLN
INTRAVENOUS | Status: DC
  Administered 2013-11-19 – 2013-11-20 (×2): 50 mL via INTRAVENOUS
  Administered 2013-11-21: 1 mL via INTRAVENOUS

## 2013-11-19 MED ORDER — FAMOTIDINE 40 MG/5ML PO SUSR
20.0000 mg | Freq: Two times a day (BID) | ORAL | Status: DC
  Administered 2013-11-19 – 2013-12-13 (×49): 20 mg
  Filled 2013-11-19 (×51): qty 2.5
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
## 2013-11-20 ENCOUNTER — Inpatient Hospital Stay (HOSPITAL_COMMUNITY): Payer: Medicare Other

## 2013-11-20 LAB — GLUCOSE, CAPILLARY
GLUCOSE-CAPILLARY: 153 mg/dL — AB (ref 70–99)
GLUCOSE-CAPILLARY: 154 mg/dL — AB (ref 70–99)
Glucose-Capillary: 111 mg/dL — ABNORMAL HIGH (ref 70–99)
Glucose-Capillary: 116 mg/dL — ABNORMAL HIGH (ref 70–99)
Glucose-Capillary: 131 mg/dL — ABNORMAL HIGH (ref 70–99)
Glucose-Capillary: 132 mg/dL — ABNORMAL HIGH (ref 70–99)
Glucose-Capillary: 150 mg/dL — ABNORMAL HIGH (ref 70–99)
Glucose-Capillary: 155 mg/dL — ABNORMAL HIGH (ref 70–99)
Glucose-Capillary: 169 mg/dL — ABNORMAL HIGH (ref 70–99)
Glucose-Capillary: 182 mg/dL — ABNORMAL HIGH (ref 70–99)

## 2013-11-20 LAB — CBC WITH DIFFERENTIAL/PLATELET
Basophils Absolute: 0 10*3/uL (ref 0.0–0.1)
Basophils Relative: 0 % (ref 0–1)
EOS PCT: 0 % (ref 0–5)
Eosinophils Absolute: 0 10*3/uL (ref 0.0–0.7)
HCT: 32.6 % — ABNORMAL LOW (ref 39.0–52.0)
Hemoglobin: 10.9 g/dL — ABNORMAL LOW (ref 13.0–17.0)
LYMPHS ABS: 0.9 10*3/uL (ref 0.7–4.0)
Lymphocytes Relative: 6 % — ABNORMAL LOW (ref 12–46)
MCH: 30.5 pg (ref 26.0–34.0)
MCHC: 33.4 g/dL (ref 30.0–36.0)
MCV: 91.3 fL (ref 78.0–100.0)
Monocytes Absolute: 0.5 10*3/uL (ref 0.1–1.0)
Monocytes Relative: 3 % (ref 3–12)
NEUTROS PCT: 91 % — AB (ref 43–77)
Neutro Abs: 13.6 10*3/uL — ABNORMAL HIGH (ref 1.7–7.7)
Platelets: 264 10*3/uL (ref 150–400)
RBC: 3.57 MIL/uL — AB (ref 4.22–5.81)
RDW: 16.2 % — ABNORMAL HIGH (ref 11.5–15.5)
WBC: 15 10*3/uL — ABNORMAL HIGH (ref 4.0–10.5)

## 2013-11-20 LAB — CULTURE, BLOOD (ROUTINE X 2)
CULTURE: NO GROWTH
CULTURE: NO GROWTH

## 2013-11-20 LAB — BASIC METABOLIC PANEL
Anion gap: 10 (ref 5–15)
BUN: 68 mg/dL — ABNORMAL HIGH (ref 6–23)
CO2: 29 mEq/L (ref 19–32)
Calcium: 8.7 mg/dL (ref 8.4–10.5)
Chloride: 107 mEq/L (ref 96–112)
Creatinine, Ser: 0.95 mg/dL (ref 0.50–1.35)
GFR calc Af Amer: >90 mL/min (ref >90)
GFR calc non Af Amer: 82 mL/min — ABNORMAL LOW (ref >90)
GLUCOSE: 168 mg/dL — AB (ref 70–99)
POTASSIUM: 4 meq/L (ref 3.7–5.3)
SODIUM: 146 meq/L (ref 137–147)

## 2013-11-20 LAB — URINALYSIS, ROUTINE W REFLEX MICROSCOPIC
Bilirubin Urine: NEGATIVE
Glucose, UA: NEGATIVE mg/dL
Hgb urine dipstick: NEGATIVE
Ketones, ur: NEGATIVE mg/dL
Leukocytes, UA: NEGATIVE
Nitrite: NEGATIVE
Protein, ur: NEGATIVE mg/dL
Specific Gravity, Urine: 1.022 (ref 1.005–1.030)
Urobilinogen, UA: 0.2 mg/dL (ref 0.0–1.0)
pH: 6 (ref 5.0–8.0)

## 2013-11-20 LAB — TRIGLYCERIDES: TRIGLYCERIDES: 409 mg/dL — AB (ref <150)

## 2013-11-20 MED ORDER — PROPOFOL 10 MG/ML IV EMUL
5.0000 ug/kg/min | INTRAVENOUS | Status: AC
  Administered 2013-11-20 – 2013-11-21 (×6): 50 ug/kg/min via INTRAVENOUS
  Filled 2013-11-20 (×5): qty 100

## 2013-11-20 MED ORDER — PROPOFOL 10 MG/ML IV EMUL
INTRAVENOUS | Status: AC
  Filled 2013-11-20: qty 100

## 2013-11-20 MED ORDER — LORAZEPAM 2 MG/ML IJ SOLN
1.0000 mg | INTRAMUSCULAR | Status: DC | PRN
  Administered 2013-11-20: 2 mg via INTRAVENOUS

## 2013-11-20 MED ORDER — LORAZEPAM 2 MG/ML IJ SOLN
INTRAMUSCULAR | Status: AC
  Filled 2013-11-20: qty 1

## 2013-11-20 MED ORDER — ALBUTEROL SULFATE (2.5 MG/3ML) 0.083% IN NEBU
2.5000 mg | INHALATION_SOLUTION | Freq: Once | RESPIRATORY_TRACT | Status: AC
  Administered 2013-11-20: 2.5 mg via RESPIRATORY_TRACT

## 2013-11-20 MED ORDER — METOPROLOL TARTRATE 12.5 MG HALF TABLET: 12.5000 mg | ORAL_TABLET | Freq: Three times a day (TID) | ORAL | Status: DC

## 2013-11-20 MED ORDER — METOPROLOL TARTRATE 25 MG/10 ML ORAL SUSPENSION
12.5000 mg | Freq: Three times a day (TID) | ORAL | Status: DC
  Administered 2013-11-20 – 2013-11-30 (×27): 12.5 mg
  Filled 2013-11-20 (×33): qty 5

## 2013-11-20 MED ORDER — FENTANYL CITRATE 0.05 MG/ML IJ SOLN
25.0000 ug | INTRAMUSCULAR | Status: DC | PRN
  Administered 2013-11-20 – 2013-12-02 (×17): 50 ug via INTRAVENOUS
  Administered 2013-12-03: 25 ug via INTRAVENOUS
  Administered 2013-12-03 – 2013-12-08 (×15): 50 ug via INTRAVENOUS
  Administered 2013-12-08 (×3): 25 ug via INTRAVENOUS
  Administered 2013-12-09 (×2): 50 ug via INTRAVENOUS
  Administered 2013-12-09: 25 ug via INTRAVENOUS
  Administered 2013-12-09 – 2013-12-15 (×3): 50 ug via INTRAVENOUS
  Administered 2013-12-16 (×3): 25 ug via INTRAVENOUS
  Filled 2013-11-20 (×45): qty 2

## 2013-11-20 MED ORDER — DEXMEDETOMIDINE HCL IN NACL 200 MCG/50ML IV SOLN
0.4000 ug/kg/h | INTRAVENOUS | Status: DC
  Administered 2013-11-20: 1.2 ug/kg/h via INTRAVENOUS
  Administered 2013-11-20: 1 ug/kg/h via INTRAVENOUS
  Filled 2013-11-20 (×2): qty 50

## 2013-11-20 MED ORDER — ALBUTEROL SULFATE (2.5 MG/3ML) 0.083% IN NEBU
INHALATION_SOLUTION | RESPIRATORY_TRACT | Status: AC
  Administered 2013-11-20: 2.5 mg via RESPIRATORY_TRACT
  Filled 2013-11-20: qty 3
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
## 2013-11-20 NOTE — Progress Notes (Signed)
Pt had projectile vomiting which desated into upper 70%; nurse stopped tubing feeding, suctioned pt, and notified RT; As RT entered room MD Morris entered into room; adjusted vent setting; switched Precedex back to Propofol; nurse will continue to monitor pt and check residuals and adjust tube feedings per protocol.

## 2013-11-21 ENCOUNTER — Inpatient Hospital Stay (HOSPITAL_COMMUNITY): Payer: Medicare Other

## 2013-11-21 LAB — GLUCOSE, CAPILLARY
GLUCOSE-CAPILLARY: 113 mg/dL — AB (ref 70–99)
Glucose-Capillary: 128 mg/dL — ABNORMAL HIGH (ref 70–99)
Glucose-Capillary: 125 mg/dL — ABNORMAL HIGH (ref 70–99)
Glucose-Capillary: 133 mg/dL — ABNORMAL HIGH (ref 70–99)
Glucose-Capillary: 123 mg/dL — ABNORMAL HIGH (ref 70–99)

## 2013-11-21 LAB — BASIC METABOLIC PANEL
Anion gap: 12 (ref 5–15)
BUN: 60 mg/dL — AB (ref 6–23)
CHLORIDE: 108 meq/L (ref 96–112)
CO2: 30 mEq/L (ref 19–32)
CREATININE: 0.86 mg/dL (ref 0.50–1.35)
Calcium: 8.7 mg/dL (ref 8.4–10.5)
GFR calc non Af Amer: 85 mL/min — ABNORMAL LOW (ref >90)
GLUCOSE: 120 mg/dL — AB (ref 70–99)
Potassium: 3.9 mEq/L (ref 3.7–5.3)
Sodium: 150 mEq/L — ABNORMAL HIGH (ref 137–147)

## 2013-11-21 LAB — CBC WITH DIFFERENTIAL/PLATELET
Basophils Absolute: 0 10*3/uL (ref 0.0–0.1)
Basophils Relative: 0 % (ref 0–1)
EOS ABS: 0 10*3/uL (ref 0.0–0.7)
EOS PCT: 0 % (ref 0–5)
HCT: 35.8 % — ABNORMAL LOW (ref 39.0–52.0)
Hemoglobin: 11.8 g/dL — ABNORMAL LOW (ref 13.0–17.0)
LYMPHS ABS: 1.2 10*3/uL (ref 0.7–4.0)
Lymphocytes Relative: 7 % — ABNORMAL LOW (ref 12–46)
MCH: 30.6 pg (ref 26.0–34.0)
MCHC: 33 g/dL (ref 30.0–36.0)
MCV: 93 fL (ref 78.0–100.0)
MONO ABS: 0.3 10*3/uL (ref 0.1–1.0)
Monocytes Relative: 2 % — ABNORMAL LOW (ref 3–12)
NEUTROS ABS: 15.6 10*3/uL — AB (ref 1.7–7.7)
Neutrophils Relative %: 91 % — ABNORMAL HIGH (ref 43–77)
Platelets: 288 10*3/uL (ref 150–400)
RBC: 3.85 MIL/uL — ABNORMAL LOW (ref 4.22–5.81)
RDW: 16.3 % — ABNORMAL HIGH (ref 11.5–15.5)
WBC: 17.1 10*3/uL — ABNORMAL HIGH (ref 4.0–10.5)

## 2013-11-21 MED ORDER — FUROSEMIDE 10 MG/ML IJ SOLN
40.0000 mg | Freq: Four times a day (QID) | INTRAMUSCULAR | Status: AC
  Administered 2013-11-21 – 2013-11-22 (×3): 40 mg via INTRAVENOUS
  Filled 2013-11-21 (×3): qty 4

## 2013-11-21 MED ORDER — METHYLPREDNISOLONE SODIUM SUCC 125 MG IJ SOLR
80.0000 mg | Freq: Two times a day (BID) | INTRAMUSCULAR | Status: DC
  Administered 2013-11-21 – 2013-11-22 (×2): 80 mg via INTRAVENOUS
  Filled 2013-11-21: qty 2
  Filled 2013-11-21 (×2): qty 1.28
  Filled 2013-11-21: qty 2

## 2013-11-21 MED ORDER — FUROSEMIDE 10 MG/ML IJ SOLN
40.0000 mg | Freq: Once | INTRAMUSCULAR | Status: AC
  Administered 2013-11-21: 40 mg via INTRAVENOUS

## 2013-11-21 MED ORDER — BUDESONIDE 0.25 MG/2ML IN SUSP
0.2500 mg | Freq: Four times a day (QID) | RESPIRATORY_TRACT | Status: DC
  Filled 2013-11-21 (×4): qty 2

## 2013-11-21 MED ORDER — MIDAZOLAM BOLUS VIA INFUSION
2.0000 mg | INTRAVENOUS | Status: DC | PRN
  Filled 2013-11-21 (×3): qty 2

## 2013-11-21 MED ORDER — SODIUM CHLORIDE 0.9 % IV SOLN
0.0000 mg/h | INTRAVENOUS | Status: DC
  Administered 2013-11-21: 4 mg/h via INTRAVENOUS
  Administered 2013-11-21: 2 mg/h via INTRAVENOUS
  Administered 2013-11-22: 5 mg/h via INTRAVENOUS
  Administered 2013-11-22: 10 mg/h via INTRAVENOUS
  Administered 2013-11-22: 5 mg/h via INTRAVENOUS
  Administered 2013-11-23 – 2013-11-24 (×8): 10 mg/h via INTRAVENOUS
  Administered 2013-11-25: 2 mg/h via INTRAVENOUS
  Administered 2013-11-25: 10 mg/h via INTRAVENOUS
  Filled 2013-11-21 (×15): qty 10

## 2013-11-21 MED ORDER — CLONIDINE HCL 0.2 MG PO TABS
0.2000 mg | ORAL_TABLET | Freq: Three times a day (TID) | ORAL | Status: DC
  Administered 2013-11-21 (×3): 0.2 mg via ORAL
  Filled 2013-11-21 (×6): qty 1

## 2013-11-21 MED ORDER — ALBUTEROL SULFATE (2.5 MG/3ML) 0.083% IN NEBU
2.5000 mg | INHALATION_SOLUTION | RESPIRATORY_TRACT | Status: DC | PRN
  Filled 2013-11-21: qty 3

## 2013-11-21 MED ORDER — BUDESONIDE 0.25 MG/2ML IN SUSP
0.2500 mg | Freq: Four times a day (QID) | RESPIRATORY_TRACT | Status: DC
  Administered 2013-11-21 – 2013-11-29 (×31): 0.25 mg via RESPIRATORY_TRACT
  Filled 2013-11-21 (×37): qty 2

## 2013-11-21 MED ORDER — INSULIN ASPART 100 UNIT/ML ~~LOC~~ SOLN
0.0000 [IU] | SUBCUTANEOUS | Status: DC
  Administered 2013-11-21: 3 [IU] via SUBCUTANEOUS
  Administered 2013-11-21 (×2): 2 [IU] via SUBCUTANEOUS
  Administered 2013-11-22 (×2): 3 [IU] via SUBCUTANEOUS
  Administered 2013-11-22: 2 [IU] via SUBCUTANEOUS
  Administered 2013-11-22 (×2): 3 [IU] via SUBCUTANEOUS
  Administered 2013-11-22: 5 [IU] via SUBCUTANEOUS
  Administered 2013-11-23: 3 [IU] via SUBCUTANEOUS
  Administered 2013-11-23 – 2013-11-25 (×10): 2 [IU] via SUBCUTANEOUS
  Administered 2013-11-25: 3 [IU] via SUBCUTANEOUS
  Administered 2013-11-25 – 2013-11-28 (×6): 2 [IU] via SUBCUTANEOUS
  Administered 2013-11-28: 1 [IU] via SUBCUTANEOUS
  Administered 2013-11-29: 2 [IU] via SUBCUTANEOUS
  Administered 2013-11-29: 3 [IU] via SUBCUTANEOUS
  Administered 2013-11-29 – 2013-11-30 (×5): 2 [IU] via SUBCUTANEOUS
  Administered 2013-12-01: 3 [IU] via SUBCUTANEOUS
  Administered 2013-12-01: 2 [IU] via SUBCUTANEOUS
  Administered 2013-12-01: 3 [IU] via SUBCUTANEOUS
  Administered 2013-12-01 (×3): 2 [IU] via SUBCUTANEOUS
  Administered 2013-12-02: 3 [IU] via SUBCUTANEOUS
  Administered 2013-12-02 (×3): 2 [IU] via SUBCUTANEOUS
  Administered 2013-12-02 (×2): 3 [IU] via SUBCUTANEOUS
  Administered 2013-12-03 (×3): 2 [IU] via SUBCUTANEOUS
  Administered 2013-12-03: 3 [IU] via SUBCUTANEOUS
  Administered 2013-12-03 – 2013-12-04 (×3): 2 [IU] via SUBCUTANEOUS
  Administered 2013-12-04 (×2): 3 [IU] via SUBCUTANEOUS
  Administered 2013-12-04: 2 [IU] via SUBCUTANEOUS
  Administered 2013-12-04: 3 [IU] via SUBCUTANEOUS
  Administered 2013-12-04 – 2013-12-12 (×17): 2 [IU] via SUBCUTANEOUS
  Administered 2013-12-13: 3 [IU] via SUBCUTANEOUS

## 2013-11-21 MED ORDER — PNEUMOCOCCAL VAC POLYVALENT 25 MCG/0.5ML IJ INJ: 0.5000 mL | INJECTION | INTRAMUSCULAR | Status: DC | PRN

## 2013-11-21 MED ORDER — POTASSIUM CHLORIDE 2 MEQ/ML IV SOLN
INTRAVENOUS | Status: DC
  Administered 2013-11-21 – 2013-11-22 (×4): via INTRAVENOUS
  Filled 2013-11-21 (×9): qty 1000

## 2013-11-21 NOTE — Progress Notes (Signed)
During the day on 11/11 the patient experienced copious projectile vomiting and gastric residuals >500 per the day-shift RN's report.  The tube feeding protocol was followed and the patient's tube feeding rate was cut in half, from 30 mL/hr to 15 mL/hr.  The rate was maintained at 15 mL/hr the rest of the day and into the evening.  At 0000 the patient again had a residual of >500 and due to the events of the day the [facility] MD was consulted.  It was decided that the tube feedings should be held for the rest of the night and the issue re-addressed in the morning.  The tube feedings were stopped.  Will continue to monitor the patient carefully.  Allen Derry, RN.
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
## 2013-11-21 NOTE — Progress Notes (Signed)
[facility] Progress Note Patient Name: Rick Church DOB: [birth year] MRN: [id number]   Date of Service  11/21/2013  HPI/Events of Note  Notified that pt has had TF residuals 400-500cc through the day today. Rate down to 15cc/h.   eICU Interventions  Will hold TF for the rest of the night, consider restart in the am 11/12     Intervention Category Intermediate Interventions: Other:  Birdie Beveridge S. 11/21/2013, 12:02 AM

## 2013-11-21 NOTE — Progress Notes (Signed)
Dr. Alva Garnet was notified that the patient's FiO2 had to be increased from 80% to 100% and even with the increase the patient's O2 sats were only between 89-92%.  Dr. Alva Garnet ordered 40 mg IV Lasix injection to be given once.  The lasix was administered, will continue to monitor patient carefully.  Allen Derry, RN.

## 2013-11-22 ENCOUNTER — Inpatient Hospital Stay (HOSPITAL_COMMUNITY): Payer: Medicare Other

## 2013-11-22 LAB — GLUCOSE, CAPILLARY
GLUCOSE-CAPILLARY: 178 mg/dL — AB (ref 70–99)
GLUCOSE-CAPILLARY: 204 mg/dL — AB (ref 70–99)
Glucose-Capillary: 118 mg/dL — ABNORMAL HIGH (ref 70–99)
Glucose-Capillary: 157 mg/dL — ABNORMAL HIGH (ref 70–99)
Glucose-Capillary: 158 mg/dL — ABNORMAL HIGH (ref 70–99)
Glucose-Capillary: 177 mg/dL — ABNORMAL HIGH (ref 70–99)
Glucose-Capillary: 218 mg/dL — ABNORMAL HIGH (ref 70–99)

## 2013-11-22 LAB — CBC
HCT: 36.2 % — ABNORMAL LOW (ref 39.0–52.0)
Hemoglobin: 12 g/dL — ABNORMAL LOW (ref 13.0–17.0)
MCH: 30.4 pg (ref 26.0–34.0)
MCHC: 33.1 g/dL (ref 30.0–36.0)
MCV: 91.6 fL (ref 78.0–100.0)
Platelets: 275 10*3/uL (ref 150–400)
RBC: 3.95 MIL/uL — AB (ref 4.22–5.81)
RDW: 15.8 % — AB (ref 11.5–15.5)
WBC: 20 10*3/uL — AB (ref 4.0–10.5)

## 2013-11-22 LAB — BASIC METABOLIC PANEL
ANION GAP: 11 (ref 5–15)
BUN: 53 mg/dL — ABNORMAL HIGH (ref 6–23)
CALCIUM: 8.5 mg/dL (ref 8.4–10.5)
CO2: 35 mEq/L — ABNORMAL HIGH (ref 19–32)
Chloride: 103 mEq/L (ref 96–112)
Creatinine, Ser: 0.85 mg/dL (ref 0.50–1.35)
GFR calc Af Amer: >90 mL/min (ref >90)
GFR calc non Af Amer: 86 mL/min — ABNORMAL LOW (ref >90)
GLUCOSE: 177 mg/dL — AB (ref 70–99)
POTASSIUM: 4.5 meq/L (ref 3.7–5.3)
SODIUM: 149 meq/L — AB (ref 137–147)

## 2013-11-22 MED ORDER — FUROSEMIDE 10 MG/ML IJ SOLN
40.0000 mg | Freq: Four times a day (QID) | INTRAMUSCULAR | Status: AC
  Administered 2013-11-22 (×3): 40 mg via INTRAVENOUS
  Filled 2013-11-22 (×3): qty 4

## 2013-11-22 MED ORDER — METOCLOPRAMIDE HCL 5 MG/ML IJ SOLN
5.0000 mg | Freq: Three times a day (TID) | INTRAMUSCULAR | Status: DC
  Administered 2013-11-22 – 2013-11-26 (×11): 5 mg via INTRAVENOUS
  Filled 2013-11-22 (×16): qty 1

## 2013-11-22 MED ORDER — CLONIDINE HCL 0.2 MG PO TABS
0.2000 mg | ORAL_TABLET | Freq: Three times a day (TID) | ORAL | Status: DC
  Administered 2013-11-22 – 2013-11-25 (×10): 0.2 mg
  Filled 2013-11-22 (×11): qty 1

## 2013-11-22 MED ORDER — METHYLPREDNISOLONE SODIUM SUCC 40 MG IJ SOLR
40.0000 mg | Freq: Two times a day (BID) | INTRAMUSCULAR | Status: DC
  Administered 2013-11-22 – 2013-11-24 (×6): 40 mg via INTRAVENOUS
  Filled 2013-11-22 (×8): qty 1
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
## 2013-11-22 NOTE — Progress Notes (Signed)
Pt arrived on the unit nurse and respiratory monitored.  Placed on ICU equipment.  RASS -3.  Vital signs stable at this time.  See nursing assessment

## 2013-11-23 ENCOUNTER — Inpatient Hospital Stay (HOSPITAL_COMMUNITY): Payer: Medicare Other

## 2013-11-23 LAB — CBC
HEMATOCRIT: 35.2 % — AB (ref 39.0–52.0)
HEMOGLOBIN: 11.4 g/dL — AB (ref 13.0–17.0)
MCH: 30.6 pg (ref 26.0–34.0)
MCHC: 32.4 g/dL (ref 30.0–36.0)
MCV: 94.6 fL (ref 78.0–100.0)
Platelets: 278 10*3/uL (ref 150–400)
RBC: 3.72 MIL/uL — ABNORMAL LOW (ref 4.22–5.81)
RDW: 15.8 % — ABNORMAL HIGH (ref 11.5–15.5)
WBC: 21 10*3/uL — ABNORMAL HIGH (ref 4.0–10.5)

## 2013-11-23 LAB — COMPREHENSIVE METABOLIC PANEL
ALK PHOS: 111 U/L (ref 39–117)
ALT: 67 U/L — ABNORMAL HIGH (ref 0–53)
AST: 59 U/L — ABNORMAL HIGH (ref 0–37)
Albumin: 1.8 g/dL — ABNORMAL LOW (ref 3.5–5.2)
Anion gap: 9 (ref 5–15)
BUN: 53 mg/dL — ABNORMAL HIGH (ref 6–23)
CALCIUM: 8.7 mg/dL (ref 8.4–10.5)
CO2: 38 mEq/L — ABNORMAL HIGH (ref 19–32)
Chloride: 98 mEq/L (ref 96–112)
Creatinine, Ser: 0.83 mg/dL (ref 0.50–1.35)
GFR calc non Af Amer: 86 mL/min — ABNORMAL LOW (ref >90)
GLUCOSE: 154 mg/dL — AB (ref 70–99)
POTASSIUM: 5.1 meq/L (ref 3.7–5.3)
Sodium: 145 mEq/L (ref 137–147)
Total Bilirubin: 0.9 mg/dL (ref 0.3–1.2)
Total Protein: 5.4 g/dL — ABNORMAL LOW (ref 6.0–8.3)

## 2013-11-23 LAB — GLUCOSE, CAPILLARY
GLUCOSE-CAPILLARY: 145 mg/dL — AB (ref 70–99)
GLUCOSE-CAPILLARY: 183 mg/dL — AB (ref 70–99)
Glucose-Capillary: 114 mg/dL — ABNORMAL HIGH (ref 70–99)
Glucose-Capillary: 121 mg/dL — ABNORMAL HIGH (ref 70–99)
Glucose-Capillary: 137 mg/dL — ABNORMAL HIGH (ref 70–99)
Glucose-Capillary: 139 mg/dL — ABNORMAL HIGH (ref 70–99)

## 2013-11-23 MED ORDER — FUROSEMIDE 10 MG/ML IJ SOLN
40.0000 mg | Freq: Once | INTRAMUSCULAR | Status: AC
  Administered 2013-11-23: 40 mg via INTRAVENOUS
  Filled 2013-11-23: qty 4
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
## 2013-11-24 ENCOUNTER — Inpatient Hospital Stay (HOSPITAL_COMMUNITY): Payer: Medicare Other

## 2013-11-24 ENCOUNTER — Encounter (HOSPITAL_COMMUNITY): Payer: Self-pay | Admitting: *Deleted

## 2013-11-24 DIAGNOSIS — G9340 Encephalopathy, unspecified: Secondary | ICD-10-CM

## 2013-11-24 LAB — CBC
HCT: 34.8 % — ABNORMAL LOW (ref 39.0–52.0)
Hemoglobin: 11.2 g/dL — ABNORMAL LOW (ref 13.0–17.0)
MCH: 30.9 pg (ref 26.0–34.0)
MCHC: 32.2 g/dL (ref 30.0–36.0)
MCV: 96.1 fL (ref 78.0–100.0)
Platelets: 243 10*3/uL (ref 150–400)
RBC: 3.62 MIL/uL — ABNORMAL LOW (ref 4.22–5.81)
RDW: 15.6 % — AB (ref 11.5–15.5)
WBC: 18.9 10*3/uL — AB (ref 4.0–10.5)

## 2013-11-24 LAB — BASIC METABOLIC PANEL
Anion gap: 9 (ref 5–15)
BUN: 56 mg/dL — ABNORMAL HIGH (ref 6–23)
CO2: 34 mEq/L — ABNORMAL HIGH (ref 19–32)
CREATININE: 0.76 mg/dL (ref 0.50–1.35)
Calcium: 8.7 mg/dL (ref 8.4–10.5)
Chloride: 98 mEq/L (ref 96–112)
GFR calc non Af Amer: 90 mL/min — ABNORMAL LOW (ref >90)
Glucose, Bld: 140 mg/dL — ABNORMAL HIGH (ref 70–99)
Potassium: 4.9 mEq/L (ref 3.7–5.3)
Sodium: 141 mEq/L (ref 137–147)

## 2013-11-24 LAB — GLUCOSE, CAPILLARY
Glucose-Capillary: 111 mg/dL — ABNORMAL HIGH (ref 70–99)
Glucose-Capillary: 111 mg/dL — ABNORMAL HIGH (ref 70–99)
Glucose-Capillary: 125 mg/dL — ABNORMAL HIGH (ref 70–99)
Glucose-Capillary: 127 mg/dL — ABNORMAL HIGH (ref 70–99)
Glucose-Capillary: 132 mg/dL — ABNORMAL HIGH (ref 70–99)
Glucose-Capillary: 137 mg/dL — ABNORMAL HIGH (ref 70–99)

## 2013-11-24 LAB — PRO B NATRIURETIC PEPTIDE: Pro B Natriuretic peptide (BNP): 534.8 pg/mL — ABNORMAL HIGH (ref 0–125)

## 2013-11-24 MED ORDER — METHADONE HCL 10 MG PO TABS
10.0000 mg | ORAL_TABLET | Freq: Two times a day (BID) | ORAL | Status: DC
  Administered 2013-11-24 – 2013-11-25 (×3): 10 mg
  Filled 2013-11-24 (×3): qty 1

## 2013-11-24 MED ORDER — CLONAZEPAM 0.5 MG PO TABS
2.0000 mg | ORAL_TABLET | Freq: Two times a day (BID) | ORAL | Status: DC
  Administered 2013-11-24 – 2013-11-25 (×3): 2 mg via ORAL
  Filled 2013-11-24 (×3): qty 4

## 2013-11-24 MED ORDER — FUROSEMIDE 10 MG/ML IJ SOLN
40.0000 mg | Freq: Once | INTRAMUSCULAR | Status: AC
  Administered 2013-11-24: 40 mg via INTRAVENOUS
  Filled 2013-11-24: qty 4
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
## 2013-11-25 ENCOUNTER — Inpatient Hospital Stay (HOSPITAL_COMMUNITY): Payer: Medicare Other

## 2013-11-25 DIAGNOSIS — J430 Unilateral pulmonary emphysema [MacLeod's syndrome]: Secondary | ICD-10-CM

## 2013-11-25 DIAGNOSIS — J9600 Acute respiratory failure, unspecified whether with hypoxia or hypercapnia: Secondary | ICD-10-CM

## 2013-11-25 LAB — BASIC METABOLIC PANEL
ANION GAP: 8 (ref 5–15)
Anion gap: 6 (ref 5–15)
BUN: 56 mg/dL — ABNORMAL HIGH (ref 6–23)
BUN: 50 mg/dL — ABNORMAL HIGH (ref 6–23)
CALCIUM: 8.9 mg/dL (ref 8.4–10.5)
CALCIUM: 8.6 mg/dL (ref 8.4–10.5)
CHLORIDE: 101 meq/L (ref 96–112)
CO2: 38 mEq/L — ABNORMAL HIGH (ref 19–32)
CO2: 39 meq/L — AB (ref 19–32)
CREATININE: 0.75 mg/dL (ref 0.50–1.35)
CREATININE: 0.73 mg/dL (ref 0.50–1.35)
Chloride: 100 mEq/L (ref 96–112)
GFR calc Af Amer: >90 mL/min (ref >90)
GFR calc non Af Amer: >90 mL/min (ref >90)
GFR calc non Af Amer: >90 mL/min (ref >90)
Glucose, Bld: 122 mg/dL — ABNORMAL HIGH (ref 70–99)
Glucose, Bld: 90 mg/dL (ref 70–99)
Potassium: 5.2 mEq/L (ref 3.7–5.3)
Potassium: 4.4 mEq/L (ref 3.7–5.3)
Sodium: 147 mEq/L (ref 137–147)
Sodium: 145 mEq/L (ref 137–147)

## 2013-11-25 LAB — GLUCOSE, CAPILLARY
GLUCOSE-CAPILLARY: 131 mg/dL — AB (ref 70–99)
GLUCOSE-CAPILLARY: 136 mg/dL — AB (ref 70–99)
Glucose-Capillary: 159 mg/dL — ABNORMAL HIGH (ref 70–99)
Glucose-Capillary: 129 mg/dL — ABNORMAL HIGH (ref 70–99)
Glucose-Capillary: 132 mg/dL — ABNORMAL HIGH (ref 70–99)
Glucose-Capillary: 119 mg/dL — ABNORMAL HIGH (ref 70–99)

## 2013-11-25 LAB — CBC
HCT: 34.9 % — ABNORMAL LOW (ref 39.0–52.0)
Hemoglobin: 10.9 g/dL — ABNORMAL LOW (ref 13.0–17.0)
MCH: 30.4 pg (ref 26.0–34.0)
MCHC: 31.2 g/dL (ref 30.0–36.0)
MCV: 97.5 fL (ref 78.0–100.0)
PLATELETS: 246 10*3/uL (ref 150–400)
RBC: 3.58 MIL/uL — AB (ref 4.22–5.81)
RDW: 15.5 % (ref 11.5–15.5)
WBC: 19.1 10*3/uL — AB (ref 4.0–10.5)

## 2013-11-25 LAB — POCT I-STAT 3, ART BLOOD GAS (G3+)
ACID-BASE EXCESS: 15 mmol/L — AB (ref 0.0–2.0)
BICARBONATE: 42.1 meq/L — AB (ref 20.0–24.0)
O2 Saturation: 92 %
PH ART: 7.423 (ref 7.350–7.450)
TCO2: 44 mmol/L (ref 0–100)
pCO2 arterial: 64.3 mmHg (ref 35.0–45.0)
pO2, Arterial: 65 mmHg — ABNORMAL LOW (ref 80.0–100.0)

## 2013-11-25 MED ORDER — METHADONE HCL 10 MG PO TABS
10.0000 mg | ORAL_TABLET | Freq: Four times a day (QID) | ORAL | Status: DC
  Administered 2013-11-25 – 2013-11-29 (×14): 10 mg
  Filled 2013-11-25 (×14): qty 1

## 2013-11-25 MED ORDER — PREDNISONE 20 MG PO TABS
20.0000 mg | ORAL_TABLET | Freq: Every day | ORAL | Status: AC
  Administered 2013-11-27: 20 mg via ORAL
  Filled 2013-11-25: qty 1

## 2013-11-25 MED ORDER — DEXTROSE 5 % IV SOLN
INTRAVENOUS | Status: DC
  Administered 2013-11-25 – 2013-11-26 (×2): via INTRAVENOUS

## 2013-11-25 MED ORDER — PREDNISONE 5 MG PO TABS
5.0000 mg | ORAL_TABLET | Freq: Every day | ORAL | Status: AC
  Administered 2013-11-29: 5 mg via ORAL
  Filled 2013-11-25: qty 1

## 2013-11-25 MED ORDER — LORAZEPAM 2 MG/ML IJ SOLN
1.0000 mg | INTRAMUSCULAR | Status: DC | PRN
  Administered 2013-11-25 – 2013-11-28 (×7): 2 mg via INTRAVENOUS
  Filled 2013-11-25 (×8): qty 1

## 2013-11-25 MED ORDER — CLONIDINE HCL 0.2 MG PO TABS
0.2000 mg | ORAL_TABLET | Freq: Two times a day (BID) | ORAL | Status: DC
  Administered 2013-11-25: 0.2 mg
  Filled 2013-11-25 (×3): qty 1

## 2013-11-25 MED ORDER — CLONAZEPAM 1 MG PO TABS
2.0000 mg | ORAL_TABLET | Freq: Two times a day (BID) | ORAL | Status: DC
  Administered 2013-11-25 – 2013-12-15 (×41): 2 mg
  Filled 2013-11-25: qty 2
  Filled 2013-11-25: qty 4
  Filled 2013-11-25 (×4): qty 2
  Filled 2013-11-25: qty 4
  Filled 2013-11-25: qty 2
  Filled 2013-11-25: qty 4
  Filled 2013-11-25 (×6): qty 2
  Filled 2013-11-25: qty 4
  Filled 2013-11-25: qty 2
  Filled 2013-11-25: qty 4
  Filled 2013-11-25 (×8): qty 2
  Filled 2013-11-25: qty 4
  Filled 2013-11-25 (×2): qty 2
  Filled 2013-11-25 (×2): qty 4
  Filled 2013-11-25 (×5): qty 2
  Filled 2013-11-25: qty 4
  Filled 2013-11-25 (×3): qty 2
  Filled 2013-11-25: qty 4

## 2013-11-25 MED ORDER — PREDNISONE 20 MG PO TABS
40.0000 mg | ORAL_TABLET | Freq: Every day | ORAL | Status: AC
  Administered 2013-11-26: 40 mg via ORAL
  Filled 2013-11-25: qty 2

## 2013-11-25 MED ORDER — METHYLPREDNISOLONE SODIUM SUCC 40 MG IJ SOLR: 40.0000 mg | Freq: Every day | INTRAMUSCULAR | Status: DC

## 2013-11-25 MED ORDER — PREDNISONE 10 MG PO TABS
10.0000 mg | ORAL_TABLET | Freq: Every day | ORAL | Status: AC
  Administered 2013-11-28: 10 mg via ORAL
  Filled 2013-11-25: qty 1
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
## 2013-11-25 NOTE — Progress Notes (Signed)
Wasted 63ml of versed in the sink. Witnessed by [employer]

## 2013-11-25 NOTE — Progress Notes (Signed)
[facility] Progress Note Patient Name: Rick Church DOB: 1940-07-13 MRN: [id number]   Date of Service  11/25/2013  HPI/Events of Note    eICU Interventions  PRN lorazepam     Intervention Category Minor Interventions: Agitation / anxiety - evaluation and management  Merton Border 11/25/2013, 5:31 PM

## 2013-11-26 ENCOUNTER — Encounter (HOSPITAL_COMMUNITY): Payer: Medicare Other

## 2013-11-26 ENCOUNTER — Inpatient Hospital Stay (HOSPITAL_COMMUNITY): Payer: Medicare Other

## 2013-11-26 LAB — PROTIME-INR
INR: 1.2 (ref 0.00–1.49)
PROTHROMBIN TIME: 15.3 s — AB (ref 11.6–15.2)

## 2013-11-26 LAB — BASIC METABOLIC PANEL
ANION GAP: 7 (ref 5–15)
BUN: 43 mg/dL — AB (ref 6–23)
CO2: 37 mEq/L — ABNORMAL HIGH (ref 19–32)
Calcium: 8.4 mg/dL (ref 8.4–10.5)
Chloride: 99 mEq/L (ref 96–112)
Creatinine, Ser: 0.76 mg/dL (ref 0.50–1.35)
GFR calc non Af Amer: 90 mL/min — ABNORMAL LOW (ref >90)
Glucose, Bld: 101 mg/dL — ABNORMAL HIGH (ref 70–99)
POTASSIUM: 4.5 meq/L (ref 3.7–5.3)
Sodium: 143 mEq/L (ref 137–147)

## 2013-11-26 LAB — CBC
HEMATOCRIT: 32.5 % — AB (ref 39.0–52.0)
Hemoglobin: 10.2 g/dL — ABNORMAL LOW (ref 13.0–17.0)
MCH: 29.9 pg (ref 26.0–34.0)
MCHC: 31.4 g/dL (ref 30.0–36.0)
MCV: 95.3 fL (ref 78.0–100.0)
Platelets: 232 10*3/uL (ref 150–400)
RBC: 3.41 MIL/uL — AB (ref 4.22–5.81)
RDW: 15.5 % (ref 11.5–15.5)
WBC: 20.9 10*3/uL — ABNORMAL HIGH (ref 4.0–10.5)

## 2013-11-26 LAB — GLUCOSE, CAPILLARY
GLUCOSE-CAPILLARY: 115 mg/dL — AB (ref 70–99)
GLUCOSE-CAPILLARY: 120 mg/dL — AB (ref 70–99)
GLUCOSE-CAPILLARY: 127 mg/dL — AB (ref 70–99)
Glucose-Capillary: 109 mg/dL — ABNORMAL HIGH (ref 70–99)
Glucose-Capillary: 112 mg/dL — ABNORMAL HIGH (ref 70–99)
Glucose-Capillary: 144 mg/dL — ABNORMAL HIGH (ref 70–99)
Glucose-Capillary: 146 mg/dL — ABNORMAL HIGH (ref 70–99)

## 2013-11-26 LAB — APTT: APTT: 30 s (ref 24–37)

## 2013-11-26 MED ORDER — MIDAZOLAM HCL 2 MG/2ML IJ SOLN: 4.0000 mg | Freq: Once | INTRAMUSCULAR | Status: DC

## 2013-11-26 MED ORDER — ETOMIDATE 2 MG/ML IV SOLN
40.0000 mg | Freq: Once | INTRAVENOUS | Status: DC
  Filled 2013-11-26: qty 20

## 2013-11-26 MED ORDER — CLONIDINE HCL 0.1 MG PO TABS
0.1000 mg | ORAL_TABLET | Freq: Every day | ORAL | Status: DC
  Administered 2013-11-26: 0.1 mg
  Filled 2013-11-26 (×3): qty 1

## 2013-11-26 MED ORDER — FREE WATER
0.0000 mL | Status: DC
Start: 2013-11-26 — End: 2013-11-27
  Administered 2013-11-26 – 2013-11-27 (×5): 10 mL

## 2013-11-26 MED ORDER — METOCLOPRAMIDE HCL 5 MG/ML IJ SOLN
5.0000 mg | Freq: Once | INTRAMUSCULAR | Status: AC
Start: 2013-11-27 — End: 2013-11-27
  Administered 2013-11-27: 5 mg via INTRAVENOUS
  Filled 2013-11-26: qty 1

## 2013-11-26 MED ORDER — VECURONIUM BROMIDE 10 MG IV SOLR
10.0000 mg | Freq: Once | INTRAVENOUS | Status: AC
  Administered 2013-11-27: 8 mg via INTRAVENOUS
  Filled 2013-11-26: qty 10

## 2013-11-26 MED ORDER — PROPOFOL 10 MG/ML IV EMUL
5.0000 ug/kg/min | Freq: Once | INTRAVENOUS | Status: AC
  Administered 2013-11-27: 30 ug/kg/min via INTRAVENOUS
  Filled 2013-11-26: qty 100

## 2013-11-26 MED ORDER — FENTANYL CITRATE 0.05 MG/ML IJ SOLN: 200.0000 ug | Freq: Once | INTRAMUSCULAR | Status: AC
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
## 2013-11-26 NOTE — Plan of Care (Signed)
Problem: Phase I Progression Outcomes Goal: Hemodynamically stable Outcome: Completed/Met Date Met:  11/26/13     

## 2013-11-27 ENCOUNTER — Inpatient Hospital Stay (HOSPITAL_COMMUNITY): Payer: Medicare Other

## 2013-11-27 ENCOUNTER — Encounter (HOSPITAL_COMMUNITY): Payer: Self-pay | Admitting: Internal Medicine

## 2013-11-27 LAB — BASIC METABOLIC PANEL
ANION GAP: 4 — AB (ref 5–15)
BUN: 36 mg/dL — ABNORMAL HIGH (ref 6–23)
CALCIUM: 8.5 mg/dL (ref 8.4–10.5)
CHLORIDE: 98 meq/L (ref 96–112)
CO2: 37 meq/L — AB (ref 19–32)
Creatinine, Ser: 0.65 mg/dL (ref 0.50–1.35)
GFR calc Af Amer: >90 mL/min (ref >90)
GFR calc non Af Amer: >90 mL/min (ref >90)
GLUCOSE: 89 mg/dL (ref 70–99)
Potassium: 4.3 mEq/L (ref 3.7–5.3)
Sodium: 139 mEq/L (ref 137–147)

## 2013-11-27 LAB — CBC
HCT: 30.1 % — ABNORMAL LOW (ref 39.0–52.0)
HEMOGLOBIN: 9.4 g/dL — AB (ref 13.0–17.0)
MCH: 29.6 pg (ref 26.0–34.0)
MCHC: 31.2 g/dL (ref 30.0–36.0)
MCV: 94.7 fL (ref 78.0–100.0)
PLATELETS: 245 10*3/uL (ref 150–400)
RBC: 3.18 MIL/uL — AB (ref 4.22–5.81)
RDW: 15.2 % (ref 11.5–15.5)
WBC: 19.6 10*3/uL — AB (ref 4.0–10.5)

## 2013-11-27 LAB — GLUCOSE, CAPILLARY
GLUCOSE-CAPILLARY: 90 mg/dL (ref 70–99)
GLUCOSE-CAPILLARY: 103 mg/dL — AB (ref 70–99)
Glucose-Capillary: 90 mg/dL (ref 70–99)
Glucose-Capillary: 105 mg/dL — ABNORMAL HIGH (ref 70–99)
Glucose-Capillary: 105 mg/dL — ABNORMAL HIGH (ref 70–99)

## 2013-11-27 MED ORDER — PHENYLEPHRINE HCL 10 MG/ML IJ SOLN
0.0000 ug/min | INTRAMUSCULAR | Status: DC
  Administered 2013-11-27: 20 ug/min via INTRAVENOUS
  Filled 2013-11-27: qty 1

## 2013-11-27 MED ORDER — FENTANYL CITRATE 0.05 MG/ML IJ SOLN
25.0000 ug | INTRAMUSCULAR | Status: AC | PRN
  Administered 2013-11-28: 50 ug via INTRAVENOUS
  Filled 2013-11-27: qty 2

## 2013-11-27 MED ORDER — DEXTROSE 5 % IV SOLN
INTRAVENOUS | Status: DC
  Administered 2013-11-27: 19:00:00 via INTRAVENOUS

## 2013-11-27 NOTE — Op Note (Signed)
Rick Church, Rick Church NO.:  [id number]  MEDICAL RECORD NO.:  [id number]  LOCATION:  2M03C                        FACILITY:  [facility]  PHYSICIAN:  Raylene Miyamoto, MD DATE OF BIRTH:  08-01-1940  DATE OF PROCEDURE:  11/27/2013 DATE OF DISCHARGE:                              OPERATIVE REPORT   PREOPERATIVE DIAGNOSES:  Legionnaires disease with Legionella pneumonia, acute respiratory distress syndrome (ARDS), continued poor weaning, and acute respiratory failure.  POSTOPERATIVE DIAGNOSIS:  Status post tracheostomy, secondary to legionnaires disease.  BRONCHOSCOPIST FOR THE PROCEDURE:  __________.  FIRST ASSISTANT:  Gaylyn Lambert, ACNP.  First assistant with the tracheostomy is Dr. Tawanna Sat, practice resident.  Consent was obtained from the patient's brother, fully aware of risks and benefits of the procedure including infection, bleeding, pneumothorax, and death.  The plan here is that the patient would not want prolonged ventilatory support in the nursing home, hence but he would accept likely tracheostomy with continued ventilatory weaning strategies for the next 10 to 14 days.  If the patient was progressing and could return to his prior functional status, then the patient would want to be continued with tracheostomy and support.  If the patient was not progressing at the 10-day to 14-day period of time, then comfort care would be initiated.  Chlorhexidine preparation was used to sterilize the operative site.  The bronchoscopist placed a bronchoscope through the endotracheal tube and backed up to approximately 16 cm.  A 7 mL of lidocaine with epinephrine was injected over the second endotracheal space.  A 1 cm vertical incision was made, dissection was made down to the tracheal planes.  We noted some thyroid tissue to the patient's right as well as the strap muscles with a fairly moderate size anterior jugular vein on the right-hand side, which we  ligated successfully with 3-0 silk sutures.  Good hemostasis was obtained.  I then placed a white catheter sheath over an 18-gauge needle to the trachea into the airway without any posterior wall injury.  The white catheter sheath remained, the needles were removed.  The wire was placed through white catheter sheath and white catheter sheath was removed.  A 14-French punch dilator was placed in and out of the airway. Progressive rhino dilator approximately 33-French placed over glider in and out of the airway.  The glider and wire remained.  A size tracheostomy 6 Shiley over 26-French dilator was attempted to place over the wire and glider with __________ some resistance, but then removed the tracheostomy and 26-French dilator and re-dilated with some extension of the 26-French dilator through the tracheostomy.  We then re- attempted to place a 26-French dilator over the 6 Shiley tracheostomy over the wire and glider successfully into the airway.  This was noted without any resistance.  The bronchoscopist placed the bronchoscope into new tracheostomy, noted carina approximately 4.5 cm below without any posterior wall injury, no active bleeding noted.  We then sutured in place the tracheostomy with 4 monofilament sutures.  The patient did tolerate procedure quite well requiring 1 bolus of fluid and supportive low- dose Neo-Synephrine.  Saturations were all within normal limits throughout the procedure.  Again blood loss was  less than 10 mL.  This patient can follow up with our Perc[facility] Tracheostomy [facility] by calling #[phone].  Portable chest x-ray is pending at this time.     Raylene Miyamoto, MD     DJF/MEDQ  D:  11/27/2013  T:  11/27/2013  Job:  [id number]

## 2013-11-27 NOTE — Procedures (Signed)
Perc trach shiley 6 placed Blood loss 10 cc 0.7 cm AJ vein noted and ligated Good hemostasis pcxr pending See full dictation  Rick Church. Titus Mould, MD, [place] Pgr: [facility]

## 2013-11-27 NOTE — Procedures (Signed)
Bedside Tracheostomy Insertion Procedure Note   Patient Details:   Name: Rick Church DOB: [birth year] MRN: [id number]  Procedure: Tracheostomy  Pre Procedure Assessment: ET Tube Size:8.0 ET Tube secured at lip (cm):24 Bite block in place: Yes Breath Sounds: Rhonch  Post Procedure Assessment: BP 100/45 mmHg  Pulse 66  Temp(Src) 99.8 F (37.7 C) (Oral)  Resp 0  Ht 5\' 7"  (1.702 m)  Wt 168 lb 14 oz (76.6 kg)  BMI 26.44 kg/m2  SpO2 99% O2 sats: stable throughout Complications: No apparent complications Patient did tolerate procedure well Tracheostomy Brand:Shiley Tracheostomy Style:Cuffed Tracheostomy Size: 6.0 Tracheostomy Secured SMO:[id number]   And ties Tracheostomy Placement Confirmation:Trach cuff visualized and in place and chest x-ray  Increased Fio2 and vent rate for procedure   Therapist informed  Andre Lefort Nanette 11/27/2013, 12:55 PM

## 2013-11-27 NOTE — Procedures (Signed)
Bronchoscopy  for Percutaneous  Tracheostomy  Name: ARNAV CREGG MRN: [id number] DOB: [birth year] Procedure: Bronchoscopy for Percutaneous Tracheostomy Indications: Diagnostic evaluation of the airways In conjunction with: Dr. Titus Mould   Procedure Details Consent: Risks of procedure as well as the alternatives and risks of each were explained to the (patient/caregiver).  Consent for procedure obtained. Time Out: Verified patient identification, verified procedure, site/side was marked, verified correct patient position, special equipment/implants available, medications/allergies/relevent history reviewed, required imaging and test results available.  Performed  In preparation for procedure, patient was given 100% FiO2 and bronchoscope lubricated. Sedation: Benzodiazepines, Muscle relaxants and Short-acting barbiturates  Airway entered and the following bronchi were examined: LLL.   Procedures performed: Endotracheal Tube retracted in 2 cm increments. Cannulation of airway observed. Dilation observed. Placement of trachel tube  observed . No overt complications. Bronchoscope removed.  , Patient placed back on 100% FiO2 at conclusion of procedure.    Evaluation Hemodynamic Status: BP stable throughout; O2 sats: stable throughout Patient's Current Condition: stable Specimens:  Sent purulent fluid Complications: No apparent complications Patient did tolerate procedure well.   Richardson Landry Minor ACNP Maryanna Shape PCCM Pager [phone] till 3 pm If no answer page [phone] 11/27/2013, 12:33 PM  I was present and supervised the entire procedure.  Rush Farmer, M.D. [facility] Pulmonary/Critical Care Medicine. Pager: [phone]. After hours pager: [phone].
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
## 2013-11-28 LAB — BASIC METABOLIC PANEL
Anion gap: 10 (ref 5–15)
BUN: 29 mg/dL — ABNORMAL HIGH (ref 6–23)
CALCIUM: 8.3 mg/dL — AB (ref 8.4–10.5)
CO2: 32 mEq/L (ref 19–32)
CREATININE: 0.66 mg/dL (ref 0.50–1.35)
Chloride: 100 mEq/L (ref 96–112)
GFR calc Af Amer: >90 mL/min (ref >90)
GLUCOSE: 105 mg/dL — AB (ref 70–99)
Potassium: 4.4 mEq/L (ref 3.7–5.3)
SODIUM: 142 meq/L (ref 137–147)

## 2013-11-28 LAB — GLUCOSE, CAPILLARY
GLUCOSE-CAPILLARY: 96 mg/dL (ref 70–99)
Glucose-Capillary: 112 mg/dL — ABNORMAL HIGH (ref 70–99)
Glucose-Capillary: 117 mg/dL — ABNORMAL HIGH (ref 70–99)
Glucose-Capillary: 121 mg/dL — ABNORMAL HIGH (ref 70–99)
Glucose-Capillary: 134 mg/dL — ABNORMAL HIGH (ref 70–99)
Glucose-Capillary: 134 mg/dL — ABNORMAL HIGH (ref 70–99)
Glucose-Capillary: 90 mg/dL (ref 70–99)

## 2013-11-28 LAB — POCT I-STAT 3, ART BLOOD GAS (G3+)
ACID-BASE EXCESS: 13 mmol/L — AB (ref 0.0–2.0)
Bicarbonate: 38.4 mEq/L — ABNORMAL HIGH (ref 20.0–24.0)
O2 Saturation: 92 %
TCO2: 40 mmol/L (ref 0–100)
pCO2 arterial: 51.7 mmHg — ABNORMAL HIGH (ref 35.0–45.0)
pH, Arterial: 7.48 — ABNORMAL HIGH (ref 7.350–7.450)
pO2, Arterial: 63 mmHg — ABNORMAL LOW (ref 80.0–100.0)

## 2013-11-28 LAB — CBC
HCT: 29.9 % — ABNORMAL LOW (ref 39.0–52.0)
HEMOGLOBIN: 9.3 g/dL — AB (ref 13.0–17.0)
MCH: 29.5 pg (ref 26.0–34.0)
MCHC: 31.1 g/dL (ref 30.0–36.0)
MCV: 94.9 fL (ref 78.0–100.0)
Platelets: 239 10*3/uL (ref 150–400)
RBC: 3.15 MIL/uL — ABNORMAL LOW (ref 4.22–5.81)
RDW: 15.2 % (ref 11.5–15.5)
WBC: 17.9 10*3/uL — ABNORMAL HIGH (ref 4.0–10.5)

## 2013-11-28 LAB — URINALYSIS, ROUTINE W REFLEX MICROSCOPIC
Bilirubin Urine: NEGATIVE
Glucose, UA: NEGATIVE mg/dL
Hgb urine dipstick: NEGATIVE
Ketones, ur: NEGATIVE mg/dL
Leukocytes, UA: NEGATIVE
NITRITE: NEGATIVE
Protein, ur: NEGATIVE mg/dL
Specific Gravity, Urine: 1.019 (ref 1.005–1.030)
UROBILINOGEN UA: 4 mg/dL — AB (ref 0.0–1.0)
pH: 7.5 (ref 5.0–8.0)

## 2013-11-28 MED ORDER — FENTANYL 50 MCG/HR TD PT72
50.0000 ug | MEDICATED_PATCH | TRANSDERMAL | Status: DC
  Administered 2013-11-28 – 2013-12-16 (×7): 50 ug via TRANSDERMAL
  Filled 2013-11-28 (×7): qty 1

## 2013-11-28 MED ORDER — POLYETHYLENE GLYCOL 3350 17 G PO PACK
17.0000 g | PACK | Freq: Every day | ORAL | Status: DC
  Administered 2013-11-28 – 2013-12-15 (×15): 17 g
  Filled 2013-11-28 (×20): qty 1

## 2013-11-28 MED ORDER — FUROSEMIDE 10 MG/ML IJ SOLN
40.0000 mg | Freq: Three times a day (TID) | INTRAMUSCULAR | Status: DC
  Administered 2013-11-28 – 2013-11-29 (×3): 40 mg via INTRAVENOUS
  Filled 2013-11-28 (×6): qty 4

## 2013-11-28 MED ORDER — LORAZEPAM 2 MG/ML IJ SOLN
1.0000 mg | Freq: Three times a day (TID) | INTRAMUSCULAR | Status: DC | PRN
  Administered 2013-11-28 (×2): 2 mg via INTRAVENOUS
  Filled 2013-11-28 (×2): qty 1

## 2013-11-28 MED ORDER — SODIUM CHLORIDE 0.9 % IV SOLN
100.0000 mg | Freq: Every day | INTRAVENOUS | Status: AC
  Administered 2013-11-28 – 2013-12-02 (×5): 100 mg via INTRAVENOUS
  Filled 2013-11-28 (×5): qty 100

## 2013-11-28 NOTE — Progress Notes (Signed)
Pt seen at this time for trach consult.  Pt has new trach 11/26/13 and remains on full vent support.  No education needed at this time.  Will continue to follow pt for progress.
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
## 2013-11-28 NOTE — Progress Notes (Signed)
UR Completed.  Rick Church [phone] 11/28/2013

## 2013-11-28 NOTE — Progress Notes (Signed)
Wasted remaining 175 ml of fentanyl gtt into sink, witnessed by Felipa Evener, Therapist, sports on 2MW.

## 2013-11-28 NOTE — Progress Notes (Addendum)
[place] Progression Note   Patient Details Name: Rick Church MRN: [id number] DOB: May 14, 1940 Today's Date: 11/28/2013   Tracheostomy Assessment    Tracheostomy Shiley 6 mm Cuffed (Active)  Status Secured 11/28/2013 12:00 PM  Site Assessment Clean 11/28/2013 12:00 PM  Site Care Cleansed;Dried 11/28/2013 12:00 PM  Inner Cannula Care Changed/new 11/28/2013 12:00 PM  Ties Assessment Changed;Clean;Dry 11/28/2013 12:00 PM  Cuff pressure (cm) 29 cm 11/27/2013  7:34 PM  Emergency Equipment at bedside Yes 11/28/2013 12:00 PM        Respiratory Therapy O2 Device: Ventilator FiO2 (%): 50 % SpO2: 95 %    Speech Language Pathology  SLP chart review complete: Patient not ready for SLP services (New trach 11/19 - await weaning)  Wait until PEEP is 5 prior to any interventions.     Nutritional Patient's Current Diet: Tube feeding Tube Feeding: Vital AF 1.2 Cal Tube Feeding Frequency: Continuous Tube Feeding Strength: Full strength     Provider  Respiratory status tenuous. Await plans regarding d/c until next week. PEEP should be 5 prior to considerations for PMSV. PCCM will progress orders when ready.       Provider present: Marni Griffon, NP  TRUE Garciamartinez, Katherene Ponto 11/28/2013, 2:09 PM

## 2013-11-29 ENCOUNTER — Inpatient Hospital Stay (HOSPITAL_COMMUNITY): Payer: Medicare Other

## 2013-11-29 LAB — CULTURE, RESPIRATORY: SPECIAL REQUESTS: NORMAL

## 2013-11-29 LAB — BASIC METABOLIC PANEL
Anion gap: 8 (ref 5–15)
BUN: 29 mg/dL — ABNORMAL HIGH (ref 6–23)
CO2: 36 meq/L — AB (ref 19–32)
Calcium: 8.5 mg/dL (ref 8.4–10.5)
Chloride: 97 mEq/L (ref 96–112)
Creatinine, Ser: 0.71 mg/dL (ref 0.50–1.35)
GFR calc Af Amer: >90 mL/min (ref >90)
GFR calc non Af Amer: >90 mL/min (ref >90)
GLUCOSE: 126 mg/dL — AB (ref 70–99)
POTASSIUM: 4.2 meq/L (ref 3.7–5.3)
SODIUM: 141 meq/L (ref 137–147)

## 2013-11-29 LAB — GLUCOSE, CAPILLARY
GLUCOSE-CAPILLARY: 127 mg/dL — AB (ref 70–99)
Glucose-Capillary: 125 mg/dL — ABNORMAL HIGH (ref 70–99)
Glucose-Capillary: 120 mg/dL — ABNORMAL HIGH (ref 70–99)
Glucose-Capillary: 110 mg/dL — ABNORMAL HIGH (ref 70–99)
Glucose-Capillary: 167 mg/dL — ABNORMAL HIGH (ref 70–99)
Glucose-Capillary: 119 mg/dL — ABNORMAL HIGH (ref 70–99)
Glucose-Capillary: 119 mg/dL — ABNORMAL HIGH (ref 70–99)
Glucose-Capillary: 124 mg/dL — ABNORMAL HIGH (ref 70–99)

## 2013-11-29 LAB — CULTURE, RESPIRATORY W GRAM STAIN

## 2013-11-29 MED ORDER — FUROSEMIDE 10 MG/ML IJ SOLN
40.0000 mg | Freq: Two times a day (BID) | INTRAMUSCULAR | Status: DC
  Administered 2013-11-29 – 2013-11-30 (×2): 40 mg via INTRAVENOUS
  Filled 2013-11-29 (×2): qty 4

## 2013-11-29 MED ORDER — FENTANYL CITRATE 0.05 MG/ML IJ SOLN: 50.0000 ug | Freq: Once | INTRAMUSCULAR | Status: DC

## 2013-11-29 MED ORDER — SODIUM CHLORIDE 0.9 % IV SOLN
0.0000 ug/h | INTRAVENOUS | Status: DC
  Administered 2013-11-29: 100 ug/h via INTRAVENOUS
  Administered 2013-11-30: 200 ug/h via INTRAVENOUS
  Filled 2013-11-29 (×2): qty 50

## 2013-11-29 MED ORDER — FENTANYL BOLUS VIA INFUSION
25.0000 ug | INTRAVENOUS | Status: DC | PRN
  Filled 2013-11-29: qty 50

## 2013-11-29 MED ORDER — IPRATROPIUM-ALBUTEROL 0.5-2.5 (3) MG/3ML IN SOLN: 3.0000 mL | RESPIRATORY_TRACT | Status: DC | PRN

## 2013-11-29 MED ORDER — BUDESONIDE 0.5 MG/2ML IN SUSP
0.5000 mg | Freq: Two times a day (BID) | RESPIRATORY_TRACT | Status: DC
  Administered 2013-11-29 – 2013-12-16 (×32): 0.5 mg via RESPIRATORY_TRACT
  Filled 2013-11-29 (×38): qty 2

## 2013-11-29 MED ORDER — METHADONE HCL 10 MG PO TABS
5.0000 mg | ORAL_TABLET | Freq: Two times a day (BID) | ORAL | Status: DC
  Administered 2013-11-29 – 2013-12-01 (×4): 5 mg
  Filled 2013-11-29 (×4): qty 1
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
## 2013-11-30 ENCOUNTER — Inpatient Hospital Stay (HOSPITAL_COMMUNITY): Payer: Medicare Other

## 2013-11-30 DIAGNOSIS — J438 Other emphysema: Secondary | ICD-10-CM

## 2013-11-30 LAB — BASIC METABOLIC PANEL
Anion gap: 13 (ref 5–15)
BUN: 34 mg/dL — ABNORMAL HIGH (ref 6–23)
CHLORIDE: 98 meq/L (ref 96–112)
CO2: 33 mEq/L — ABNORMAL HIGH (ref 19–32)
Calcium: 8.5 mg/dL (ref 8.4–10.5)
Creatinine, Ser: 0.74 mg/dL (ref 0.50–1.35)
Glucose, Bld: 125 mg/dL — ABNORMAL HIGH (ref 70–99)
Potassium: 3.6 mEq/L — ABNORMAL LOW (ref 3.7–5.3)
SODIUM: 144 meq/L (ref 137–147)

## 2013-11-30 LAB — CBC
HCT: 31.6 % — ABNORMAL LOW (ref 39.0–52.0)
HEMATOCRIT: 30.7 % — AB (ref 39.0–52.0)
HEMOGLOBIN: 10.2 g/dL — AB (ref 13.0–17.0)
Hemoglobin: 9.8 g/dL — ABNORMAL LOW (ref 13.0–17.0)
MCH: 30.3 pg (ref 26.0–34.0)
MCH: 29.8 pg (ref 26.0–34.0)
MCHC: 32.3 g/dL (ref 30.0–36.0)
MCHC: 31.9 g/dL (ref 30.0–36.0)
MCV: 93.8 fL (ref 78.0–100.0)
MCV: 93.3 fL (ref 78.0–100.0)
PLATELETS: 279 10*3/uL (ref 150–400)
Platelets: 317 10*3/uL (ref 150–400)
RBC: 3.37 MIL/uL — AB (ref 4.22–5.81)
RBC: 3.29 MIL/uL — ABNORMAL LOW (ref 4.22–5.81)
RDW: 15.2 % (ref 11.5–15.5)
RDW: 15.5 % (ref 11.5–15.5)
WBC: 14.3 10*3/uL — AB (ref 4.0–10.5)
WBC: 13.1 10*3/uL — ABNORMAL HIGH (ref 4.0–10.5)

## 2013-11-30 LAB — BLOOD GAS, ARTERIAL
ACID-BASE EXCESS: 12.2 mmol/L — AB (ref 0.0–2.0)
Bicarbonate: 36.9 mEq/L — ABNORMAL HIGH (ref 20.0–24.0)
Drawn by: 24513
FIO2: 50 %
O2 Saturation: 96.7 %
PATIENT TEMPERATURE: 99.4
PEEP: 8 cmH2O
RATE: 12 resp/min
TCO2: 38.6 mmol/L (ref 0–100)
VT: 460 mL
pCO2 arterial: 55 mmHg — ABNORMAL HIGH (ref 35.0–45.0)
pH, Arterial: 7.445 (ref 7.350–7.450)
pO2, Arterial: 81.1 mmHg (ref 80.0–100.0)

## 2013-11-30 LAB — GLUCOSE, CAPILLARY
GLUCOSE-CAPILLARY: 126 mg/dL — AB (ref 70–99)
GLUCOSE-CAPILLARY: 147 mg/dL — AB (ref 70–99)
Glucose-Capillary: 132 mg/dL — ABNORMAL HIGH (ref 70–99)
Glucose-Capillary: 140 mg/dL — ABNORMAL HIGH (ref 70–99)
Glucose-Capillary: 118 mg/dL — ABNORMAL HIGH (ref 70–99)

## 2013-11-30 MED ORDER — FUROSEMIDE 10 MG/ML IJ SOLN
40.0000 mg | Freq: Three times a day (TID) | INTRAMUSCULAR | Status: AC
  Administered 2013-11-30 – 2013-12-01 (×3): 40 mg via INTRAVENOUS
  Filled 2013-11-30: qty 4

## 2013-11-30 MED ORDER — HALOPERIDOL LACTATE 5 MG/ML IJ SOLN
5.0000 mg | INTRAMUSCULAR | Status: DC | PRN
  Administered 2013-12-01 (×2): 5 mg via INTRAMUSCULAR
  Filled 2013-11-30 (×2): qty 1

## 2013-11-30 MED ORDER — MEPERIDINE HCL 50 MG/ML IJ SOLN
50.0000 mg | Freq: Once | INTRAMUSCULAR | Status: AC
  Administered 2013-11-30: 50 mg via INTRAMUSCULAR
  Filled 2013-11-30: qty 1

## 2013-11-30 MED ORDER — POTASSIUM CHLORIDE 10 MEQ/100ML IV SOLN: 10.0000 meq | INTRAVENOUS | Status: DC

## 2013-11-30 MED ORDER — FLEET ENEMA 7-19 GM/118ML RE ENEM
1.0000 | ENEMA | Freq: Once | RECTAL | Status: AC
  Administered 2013-11-30: 1 via RECTAL
  Filled 2013-11-30: qty 1

## 2013-11-30 MED ORDER — DOCUSATE SODIUM 50 MG/5ML PO LIQD
50.0000 mg | Freq: Two times a day (BID) | ORAL | Status: DC
  Administered 2013-11-30 – 2013-12-13 (×25): 50 mg
  Filled 2013-11-30 (×28): qty 10

## 2013-11-30 MED ORDER — INFLUENZA VAC SPLIT QUAD 0.5 ML IM SUSY
0.5000 mL | PREFILLED_SYRINGE | INTRAMUSCULAR | Status: AC
  Administered 2013-12-01: 0.5 mL via INTRAMUSCULAR
  Filled 2013-11-30: qty 0.5

## 2013-11-30 MED ORDER — HALOPERIDOL LACTATE 5 MG/ML IJ SOLN
INTRAMUSCULAR | Status: AC
  Administered 2013-11-30: 5 mg
  Filled 2013-11-30: qty 1

## 2013-11-30 MED ORDER — IPRATROPIUM-ALBUTEROL 0.5-2.5 (3) MG/3ML IN SOLN
3.0000 mL | Freq: Four times a day (QID) | RESPIRATORY_TRACT | Status: DC
  Administered 2013-11-30 – 2013-12-07 (×26): 3 mL via RESPIRATORY_TRACT
  Filled 2013-11-30 (×27): qty 3

## 2013-11-30 MED ORDER — METOPROLOL TARTRATE 25 MG/10 ML ORAL SUSPENSION
12.5000 mg | Freq: Two times a day (BID) | ORAL | Status: DC
  Administered 2013-11-30 – 2013-12-01 (×2): 12.5 mg
  Filled 2013-11-30 (×3): qty 5

## 2013-11-30 MED ORDER — POTASSIUM CHLORIDE 20 MEQ/15ML (10%) PO SOLN
10.0000 meq | ORAL | Status: AC
  Administered 2013-11-30 (×2): 10 meq
  Filled 2013-11-30 (×2): qty 15

## 2013-11-30 MED ORDER — POTASSIUM CHLORIDE 20 MEQ/15ML (10%) PO SOLN
40.0000 meq | Freq: Two times a day (BID) | ORAL | Status: AC
  Administered 2013-11-30 (×2): 40 meq
  Filled 2013-11-30 (×2): qty 30

## 2013-11-30 NOTE — Progress Notes (Signed)
[facility] Electrolyte Replacement Protocol  Patient Name: Rick Church DOB: 1940/01/14 MRN: [id number]  Date of Service  11/30/2013   HPI/Events of Note    Recent Labs Lab 11/26/13 0425 11/27/13 0428 11/28/13 0610 11/29/13 0815 11/30/13 0411  NA 143 139 142 141 144  K 4.5 4.3 4.4 4.2 3.6*  CL 99 98 100 97 98  CO2 37* 37* 32 36* 33*  GLUCOSE 101* 89 105* 126* 125*  BUN 43* 36* 29* 29* 34*  CREATININE 0.76 0.65 0.66 0.71 0.74  CALCIUM 8.4 8.5 8.3* 8.5 8.5    Estimated Creatinine Clearance: 79.2 mL/min (by C-G formula based on Cr of 0.74).  Intake/Output      11/20 0701 - 11/21 0700   I.V. (mL/kg) 282.5 (3.8)   NG/GT 1065   IV Piggyback 250   Total Intake(mL/kg) 1597.5 (21.7)   Urine (mL/kg/hr) 2770 (1.6)   Total Output 2770   Net -1172.5        - I/O DETAILED x24h    Total I/O In: 537.5 [I.V.:192.5; NG/GT:345] Out: 680 [Urine:680] - I/O THIS SHIFT    ASSESSMENT   eICURN Interventions   Electrolyte protocol criteria met. Lab values replaced per protocol. MD notified.   ASSESSMENT: MAJOR ELECTROLYTE    Lorene Dy 11/30/2013, 5:19 AM

## 2013-11-30 NOTE — Progress Notes (Signed)
Received pt as transfer. CHG bath done VS done oriented to room.Restless and agitated noted fentanyl patch on Lt arm Pt  placed on alternate low air loss mattress bed

## 2013-11-30 NOTE — Progress Notes (Signed)
[facility]-Link notified regarding no labs ordered this AM. Awaiting further orders.

## 2013-11-30 NOTE — Progress Notes (Signed)
Pt extremely restless. Will not follow commands asked if has pain need to follow CPOT  Medicated with Fentanyl 50 mg IV - Has soft mittens on to prevent pt from pulling at trach and feeding tube. Trying to get out of bed constantly
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
## 2013-12-01 ENCOUNTER — Inpatient Hospital Stay (HOSPITAL_COMMUNITY): Payer: Medicare Other

## 2013-12-01 DIAGNOSIS — A419 Sepsis, unspecified organism: Secondary | ICD-10-CM | POA: Diagnosis not present

## 2013-12-01 LAB — GLUCOSE, CAPILLARY
GLUCOSE-CAPILLARY: 145 mg/dL — AB (ref 70–99)
GLUCOSE-CAPILLARY: 143 mg/dL — AB (ref 70–99)
Glucose-Capillary: 133 mg/dL — ABNORMAL HIGH (ref 70–99)
Glucose-Capillary: 133 mg/dL — ABNORMAL HIGH (ref 70–99)
Glucose-Capillary: 155 mg/dL — ABNORMAL HIGH (ref 70–99)
Glucose-Capillary: 157 mg/dL — ABNORMAL HIGH (ref 70–99)

## 2013-12-01 LAB — BLOOD GAS, ARTERIAL
Acid-Base Excess: 10.1 mmol/L — ABNORMAL HIGH (ref 0.0–2.0)
BICARBONATE: 33.8 meq/L — AB (ref 20.0–24.0)
FIO2: 0.5 %
MECHVT: 460 mL
O2 Saturation: 94.5 %
PATIENT TEMPERATURE: 98.6
PEEP/CPAP: 5 cmH2O
RATE: 12 resp/min
TCO2: 35.1 mmol/L (ref 0–100)
pCO2 arterial: 42.4 mmHg (ref 35.0–45.0)
pH, Arterial: 7.513 — ABNORMAL HIGH (ref 7.350–7.450)
pO2, Arterial: 71.5 mmHg — ABNORMAL LOW (ref 80.0–100.0)

## 2013-12-01 LAB — BASIC METABOLIC PANEL
Anion gap: 12 (ref 5–15)
BUN: 45 mg/dL — ABNORMAL HIGH (ref 6–23)
CALCIUM: 8.5 mg/dL (ref 8.4–10.5)
CO2: 31 meq/L (ref 19–32)
CREATININE: 0.89 mg/dL (ref 0.50–1.35)
Chloride: 102 mEq/L (ref 96–112)
GFR calc Af Amer: >90 mL/min (ref >90)
GFR calc non Af Amer: 84 mL/min — ABNORMAL LOW (ref >90)
Glucose, Bld: 125 mg/dL — ABNORMAL HIGH (ref 70–99)
Potassium: 3.9 mEq/L (ref 3.7–5.3)
SODIUM: 145 meq/L (ref 137–147)

## 2013-12-01 MED ORDER — HALOPERIDOL LACTATE 5 MG/ML IJ SOLN
5.0000 mg | INTRAMUSCULAR | Status: DC | PRN
  Administered 2013-12-02 – 2013-12-15 (×11): 5 mg via INTRAVENOUS
  Filled 2013-12-01 (×11): qty 1

## 2013-12-01 MED ORDER — CLONIDINE HCL 0.1 MG PO TABS
0.1000 mg | ORAL_TABLET | Freq: Three times a day (TID) | ORAL | Status: DC
  Administered 2013-12-01 – 2013-12-09 (×25): 0.1 mg
  Filled 2013-12-01 (×27): qty 1

## 2013-12-01 MED ORDER — CLONIDINE HCL 0.1 MG PO TABS: 0.1000 mg | ORAL_TABLET | Freq: Three times a day (TID) | ORAL | Status: DC

## 2013-12-01 NOTE — Progress Notes (Signed)
Pt extremely agitated at shift change.  Trying to climb out of the bed over the rails; flexi-seal accidentally dislodged during agitation.  New orders rec'd.  Haldol 5 mg given at 2044; Demerol 50 mg IM given at 2056 with positive results. Flexi-seal re-inserted at approx 2130.  102.4 rectal fever at 2103 relieved by 650 tylenol given at 2110.  Pt started to become agitated again. 5 mg of Haldol given at 0038 with positive results. Copious thick yellow secretions requiring frequent suctioning, improvement with suctioning and trach care.  Pt appears to be resting comfortably at this time will continue to monitor for periods of agitation.
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
## 2013-12-01 NOTE — Progress Notes (Deleted)
Lab called x2 to determine why no one has come yet to draw 0000 labs.

## 2013-12-01 NOTE — Progress Notes (Signed)
[facility] Progress Note Patient Name: Rick Church DOB: August 21, 1940 MRN: [id number]   Date of Service  12/01/2013  HPI/Events of Note  Patient with trach placed on Nov 18.  Cuff now leaking or trach positional.  Patient not in distress with good O2sat. Exhaled volumes small.   eICU Interventions  Trach needs changing or re-eval in am. Will check abg     Intervention Category Intermediate Interventions: Other:  Mauri Brooklyn, Mamie Nick 12/01/2013, 10:41 PM

## 2013-12-01 NOTE — Progress Notes (Signed)
Called by RN to assess pt. For cuff leak. More air was added to cuff & exhaled VT's & Mve remained low. CCM was made aware & ABG was ordered. CCM stated that the rounding CCM in the AM would assess pt. For trach change to a XLT.

## 2013-12-01 NOTE — Progress Notes (Signed)
Pt mentation improving, started to ask questions and comprehend answers with verbalizing back.  Pt started to become agitated again when speaking of events leading to admission, pt having trouble grasping time lapse in hospital from admission to now.  Dose of haldol given at 0400 with positive results.  Questions answered to pt's satisfaction, emotional support given. Pt appears to be more relaxed, still asking questions, asking for something to drink specifically Dr. Malachi Bonds.

## 2013-12-02 LAB — GLUCOSE, CAPILLARY
GLUCOSE-CAPILLARY: 158 mg/dL — AB (ref 70–99)
GLUCOSE-CAPILLARY: 157 mg/dL — AB (ref 70–99)
Glucose-Capillary: 135 mg/dL — ABNORMAL HIGH (ref 70–99)
Glucose-Capillary: 161 mg/dL — ABNORMAL HIGH (ref 70–99)
Glucose-Capillary: 138 mg/dL — ABNORMAL HIGH (ref 70–99)
Glucose-Capillary: 140 mg/dL — ABNORMAL HIGH (ref 70–99)

## 2013-12-02 NOTE — Progress Notes (Signed)
Called by RN for vent alarming. Vent still alarming due to cuff leak causing low MVe. More air added to cuff for minimal cuff leak.

## 2013-12-02 NOTE — Progress Notes (Signed)
MD called to discuss patients continuous cuff leak problem. MD aware and stated it would be changed about around 10am tomorrow morning due to the newness of the trach.
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
## 2013-12-03 ENCOUNTER — Inpatient Hospital Stay (HOSPITAL_COMMUNITY): Payer: Medicare Other

## 2013-12-03 DIAGNOSIS — Z930 Tracheostomy status: Secondary | ICD-10-CM

## 2013-12-03 LAB — BASIC METABOLIC PANEL
ANION GAP: 12 (ref 5–15)
BUN: 43 mg/dL — ABNORMAL HIGH (ref 6–23)
CHLORIDE: 104 meq/L (ref 96–112)
CO2: 30 mEq/L (ref 19–32)
Calcium: 8.8 mg/dL (ref 8.4–10.5)
Creatinine, Ser: 0.72 mg/dL (ref 0.50–1.35)
GFR calc non Af Amer: >90 mL/min (ref >90)
Glucose, Bld: 138 mg/dL — ABNORMAL HIGH (ref 70–99)
POTASSIUM: 3.5 meq/L — AB (ref 3.7–5.3)
Sodium: 146 mEq/L (ref 137–147)

## 2013-12-03 LAB — CBC
HCT: 26.7 % — ABNORMAL LOW (ref 39.0–52.0)
HEMOGLOBIN: 8.8 g/dL — AB (ref 13.0–17.0)
MCH: 31.4 pg (ref 26.0–34.0)
MCHC: 33 g/dL (ref 30.0–36.0)
MCV: 95.4 fL (ref 78.0–100.0)
Platelets: 324 10*3/uL (ref 150–400)
RBC: 2.8 MIL/uL — AB (ref 4.22–5.81)
RDW: 15.6 % — ABNORMAL HIGH (ref 11.5–15.5)
WBC: 8.1 10*3/uL (ref 4.0–10.5)

## 2013-12-03 LAB — GLUCOSE, CAPILLARY
GLUCOSE-CAPILLARY: 128 mg/dL — AB (ref 70–99)
GLUCOSE-CAPILLARY: 148 mg/dL — AB (ref 70–99)
GLUCOSE-CAPILLARY: 149 mg/dL — AB (ref 70–99)
GLUCOSE-CAPILLARY: 152 mg/dL — AB (ref 70–99)
Glucose-Capillary: 139 mg/dL — ABNORMAL HIGH (ref 70–99)
Glucose-Capillary: 134 mg/dL — ABNORMAL HIGH (ref 70–99)
Glucose-Capillary: 126 mg/dL — ABNORMAL HIGH (ref 70–99)

## 2013-12-03 NOTE — Procedures (Signed)
First Trach Change  Cuff leaking, sutures removed, cuff down, trach removed over a changer then replaced with a six cuffed shiley and changer removed with good volume return and stable sats.  No need for a CXR.  Rush Farmer, M.D. [facility]monary/Critical Care Medicine. Pager: [phone]. After hours pager: [phone].

## 2013-12-03 NOTE — Plan of Care (Signed)
Problem: Phase I Progression Outcomes Goal: Patient tolerating nututrition at goal Outcome: Progressing
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
## 2013-12-03 NOTE — Progress Notes (Signed)
Trach changed due to a cuff leak to a #6 cuffed Shiley with no complications. Color changed and bilateral breath sounds noted.

## 2013-12-04 LAB — PHOSPHORUS: PHOSPHORUS: 3.7 mg/dL (ref 2.3–4.6)

## 2013-12-04 LAB — BASIC METABOLIC PANEL
ANION GAP: 9 (ref 5–15)
BUN: 47 mg/dL — ABNORMAL HIGH (ref 6–23)
CALCIUM: 9 mg/dL (ref 8.4–10.5)
CO2: 31 mEq/L (ref 19–32)
Chloride: 108 mEq/L (ref 96–112)
Creatinine, Ser: 0.66 mg/dL (ref 0.50–1.35)
Glucose, Bld: 130 mg/dL — ABNORMAL HIGH (ref 70–99)
Potassium: 3.7 mEq/L (ref 3.7–5.3)
SODIUM: 148 meq/L — AB (ref 137–147)

## 2013-12-04 LAB — CBC
HCT: 29.7 % — ABNORMAL LOW (ref 39.0–52.0)
Hemoglobin: 9.2 g/dL — ABNORMAL LOW (ref 13.0–17.0)
MCH: 29.4 pg (ref 26.0–34.0)
MCHC: 31 g/dL (ref 30.0–36.0)
MCV: 94.9 fL (ref 78.0–100.0)
PLATELETS: 340 10*3/uL (ref 150–400)
RBC: 3.13 MIL/uL — ABNORMAL LOW (ref 4.22–5.81)
RDW: 15.7 % — AB (ref 11.5–15.5)
WBC: 10.5 10*3/uL (ref 4.0–10.5)

## 2013-12-04 LAB — GLUCOSE, CAPILLARY
GLUCOSE-CAPILLARY: 158 mg/dL — AB (ref 70–99)
Glucose-Capillary: 131 mg/dL — ABNORMAL HIGH (ref 70–99)
Glucose-Capillary: 132 mg/dL — ABNORMAL HIGH (ref 70–99)
Glucose-Capillary: 140 mg/dL — ABNORMAL HIGH (ref 70–99)
Glucose-Capillary: 156 mg/dL — ABNORMAL HIGH (ref 70–99)
Glucose-Capillary: 157 mg/dL — ABNORMAL HIGH (ref 70–99)

## 2013-12-04 LAB — MAGNESIUM: MAGNESIUM: 2.4 mg/dL (ref 1.5–2.5)

## 2013-12-04 MED ORDER — LORAZEPAM 2 MG/ML IJ SOLN
2.0000 mg | Freq: Once | INTRAMUSCULAR | Status: AC
  Administered 2013-12-04: 2 mg via INTRAVENOUS
  Filled 2013-12-04: qty 1

## 2013-12-04 NOTE — Plan of Care (Signed)
Problem: Phase I Progression Outcomes Goal: Patient tolerating nututrition at goal Outcome: Progressing Goal: Tracheostomy by Vent Day 14 Outcome: Progressing

## 2013-12-04 NOTE — Progress Notes (Signed)
RT assessed patient for lost tidal volume and possible cuff leak. RT advanced trach and lost volume resolved. When pt coughs, trach comes back out of position. Patient may need a distal trach.
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
## 2013-12-05 ENCOUNTER — Inpatient Hospital Stay (HOSPITAL_COMMUNITY): Payer: Medicare Other

## 2013-12-05 DIAGNOSIS — E870 Hyperosmolality and hypernatremia: Secondary | ICD-10-CM | POA: Diagnosis present

## 2013-12-05 DIAGNOSIS — E440 Moderate protein-calorie malnutrition: Secondary | ICD-10-CM | POA: Diagnosis not present

## 2013-12-05 LAB — GLUCOSE, CAPILLARY
GLUCOSE-CAPILLARY: 146 mg/dL — AB (ref 70–99)
GLUCOSE-CAPILLARY: 128 mg/dL — AB (ref 70–99)
Glucose-Capillary: 135 mg/dL — ABNORMAL HIGH (ref 70–99)
Glucose-Capillary: 143 mg/dL — ABNORMAL HIGH (ref 70–99)
Glucose-Capillary: 125 mg/dL — ABNORMAL HIGH (ref 70–99)
Glucose-Capillary: 130 mg/dL — ABNORMAL HIGH (ref 70–99)

## 2013-12-05 LAB — BASIC METABOLIC PANEL
ANION GAP: 9 (ref 5–15)
BUN: 50 mg/dL — ABNORMAL HIGH (ref 6–23)
CALCIUM: 9 mg/dL (ref 8.4–10.5)
CO2: 30 mEq/L (ref 19–32)
Chloride: 111 mEq/L (ref 96–112)
Creatinine, Ser: 0.69 mg/dL (ref 0.50–1.35)
GFR calc Af Amer: >90 mL/min (ref >90)
GLUCOSE: 143 mg/dL — AB (ref 70–99)
Potassium: 3.9 mEq/L (ref 3.7–5.3)
Sodium: 150 mEq/L — ABNORMAL HIGH (ref 137–147)

## 2013-12-05 LAB — CBC
HCT: 28.8 % — ABNORMAL LOW (ref 39.0–52.0)
Hemoglobin: 8.9 g/dL — ABNORMAL LOW (ref 13.0–17.0)
MCH: 29.5 pg (ref 26.0–34.0)
MCHC: 30.9 g/dL (ref 30.0–36.0)
MCV: 95.4 fL (ref 78.0–100.0)
PLATELETS: 288 10*3/uL (ref 150–400)
RBC: 3.02 MIL/uL — ABNORMAL LOW (ref 4.22–5.81)
RDW: 15.8 % — ABNORMAL HIGH (ref 11.5–15.5)
WBC: 9.2 10*3/uL (ref 4.0–10.5)

## 2013-12-05 LAB — MAGNESIUM: Magnesium: 2.4 mg/dL (ref 1.5–2.5)

## 2013-12-05 LAB — PHOSPHORUS: Phosphorus: 3.6 mg/dL (ref 2.3–4.6)

## 2013-12-05 MED ORDER — FREE WATER
200.0000 mL | Freq: Four times a day (QID) | Status: DC
  Administered 2013-12-05 – 2013-12-07 (×8): 200 mL

## 2013-12-05 NOTE — Plan of Care (Signed)
Problem: Phase I Progression Outcomes Goal: Patient tolerating nututrition at goal Outcome: Completed/Met Date Met:  12/05/13 Goal: Progressing towards optiumm acitivities Outcome: Progressing Goal: Patient tolerating weaning plan Outcome: Progressing  Problem: Phase II Progression Outcomes Goal: Hemodynamically stable Outcome: Progressing
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
## 2013-12-06 LAB — BASIC METABOLIC PANEL
Anion gap: 8 (ref 5–15)
BUN: 49 mg/dL — AB (ref 6–23)
CALCIUM: 8.9 mg/dL (ref 8.4–10.5)
CO2: 31 mEq/L (ref 19–32)
Chloride: 110 mEq/L (ref 96–112)
Creatinine, Ser: 0.61 mg/dL (ref 0.50–1.35)
GFR calc Af Amer: >90 mL/min (ref >90)
Glucose, Bld: 102 mg/dL — ABNORMAL HIGH (ref 70–99)
POTASSIUM: 3.7 meq/L (ref 3.7–5.3)
Sodium: 149 mEq/L — ABNORMAL HIGH (ref 137–147)

## 2013-12-06 LAB — GLUCOSE, CAPILLARY
GLUCOSE-CAPILLARY: 129 mg/dL — AB (ref 70–99)
GLUCOSE-CAPILLARY: 131 mg/dL — AB (ref 70–99)
Glucose-Capillary: 136 mg/dL — ABNORMAL HIGH (ref 70–99)
Glucose-Capillary: 125 mg/dL — ABNORMAL HIGH (ref 70–99)
Glucose-Capillary: 104 mg/dL — ABNORMAL HIGH (ref 70–99)
Glucose-Capillary: 134 mg/dL — ABNORMAL HIGH (ref 70–99)

## 2013-12-06 NOTE — Plan of Care (Signed)
Problem: Phase I Progression Outcomes Goal: Patient tolerating weaning plan Outcome: Progressing
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
## 2013-12-07 LAB — CBC
HEMATOCRIT: 27.8 % — AB (ref 39.0–52.0)
HEMOGLOBIN: 8.8 g/dL — AB (ref 13.0–17.0)
MCH: 30.8 pg (ref 26.0–34.0)
MCHC: 31.7 g/dL (ref 30.0–36.0)
MCV: 97.2 fL (ref 78.0–100.0)
Platelets: 258 10*3/uL (ref 150–400)
RBC: 2.86 MIL/uL — AB (ref 4.22–5.81)
RDW: 15.4 % (ref 11.5–15.5)
WBC: 7.2 10*3/uL (ref 4.0–10.5)

## 2013-12-07 LAB — GLUCOSE, CAPILLARY
GLUCOSE-CAPILLARY: 135 mg/dL — AB (ref 70–99)
GLUCOSE-CAPILLARY: 146 mg/dL — AB (ref 70–99)
Glucose-Capillary: 112 mg/dL — ABNORMAL HIGH (ref 70–99)
Glucose-Capillary: 106 mg/dL — ABNORMAL HIGH (ref 70–99)
Glucose-Capillary: 120 mg/dL — ABNORMAL HIGH (ref 70–99)
Glucose-Capillary: 105 mg/dL — ABNORMAL HIGH (ref 70–99)

## 2013-12-07 LAB — MAGNESIUM: MAGNESIUM: 2.4 mg/dL (ref 1.5–2.5)

## 2013-12-07 LAB — BASIC METABOLIC PANEL
Anion gap: 9 (ref 5–15)
BUN: 43 mg/dL — AB (ref 6–23)
CO2: 28 meq/L (ref 19–32)
Calcium: 8.5 mg/dL (ref 8.4–10.5)
Chloride: 112 mEq/L (ref 96–112)
Creatinine, Ser: 0.55 mg/dL (ref 0.50–1.35)
GFR calc Af Amer: >90 mL/min (ref >90)
GFR calc non Af Amer: >90 mL/min (ref >90)
GLUCOSE: 127 mg/dL — AB (ref 70–99)
Potassium: 3.7 mEq/L (ref 3.7–5.3)
SODIUM: 149 meq/L — AB (ref 137–147)

## 2013-12-07 LAB — PHOSPHORUS: PHOSPHORUS: 3.8 mg/dL (ref 2.3–4.6)

## 2013-12-07 MED ORDER — FREE WATER
300.0000 mL | Freq: Four times a day (QID) | Status: DC
  Administered 2013-12-07 – 2013-12-12 (×20): 300 mL

## 2013-12-07 MED ORDER — IPRATROPIUM-ALBUTEROL 0.5-2.5 (3) MG/3ML IN SOLN
3.0000 mL | Freq: Four times a day (QID) | RESPIRATORY_TRACT | Status: DC
  Administered 2013-12-07 – 2013-12-16 (×35): 3 mL via RESPIRATORY_TRACT
  Filled 2013-12-07 (×39): qty 3
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
## 2013-12-07 NOTE — Progress Notes (Signed)
Spoke to [place], Therapist, sports in [place] re: pt's pulse in the 30's - 40's while sleeping.
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
## 2013-12-07 NOTE — Plan of Care (Signed)
Problem: Phase I Progression Outcomes Goal: Progressing towards optiumm acitivities Outcome: Progressing Goal: Patient tolerating weaning plan Outcome: Completed/Met Date Met:  12/07/13

## 2013-12-07 NOTE — Plan of Care (Signed)
Problem: SLP Dysphagia Goals Goal: Patient will demonstrate readiness for PO's Patient will demonstrate readiness for PO's and/or instrumental swallow study as evidenced by: Outcome: Progressing

## 2013-12-08 DIAGNOSIS — E440 Moderate protein-calorie malnutrition: Secondary | ICD-10-CM

## 2013-12-08 LAB — GLUCOSE, CAPILLARY
GLUCOSE-CAPILLARY: 92 mg/dL (ref 70–99)
Glucose-Capillary: 105 mg/dL — ABNORMAL HIGH (ref 70–99)
Glucose-Capillary: 111 mg/dL — ABNORMAL HIGH (ref 70–99)
Glucose-Capillary: 113 mg/dL — ABNORMAL HIGH (ref 70–99)
Glucose-Capillary: 115 mg/dL — ABNORMAL HIGH (ref 70–99)
Glucose-Capillary: 98 mg/dL (ref 70–99)

## 2013-12-08 LAB — BASIC METABOLIC PANEL
Anion gap: 8 (ref 5–15)
BUN: 39 mg/dL — ABNORMAL HIGH (ref 6–23)
CHLORIDE: 108 meq/L (ref 96–112)
CO2: 30 meq/L (ref 19–32)
CREATININE: 0.61 mg/dL (ref 0.50–1.35)
Calcium: 8.7 mg/dL (ref 8.4–10.5)
GFR calc Af Amer: >90 mL/min (ref >90)
GFR calc non Af Amer: >90 mL/min (ref >90)
Glucose, Bld: 101 mg/dL — ABNORMAL HIGH (ref 70–99)
Potassium: 4 mEq/L (ref 3.7–5.3)
Sodium: 146 mEq/L (ref 137–147)

## 2013-12-08 MED ORDER — CITALOPRAM HYDROBROMIDE 10 MG PO TABS
10.0000 mg | ORAL_TABLET | Freq: Every day | ORAL | Status: DC
  Administered 2013-12-08 – 2013-12-15 (×8): 10 mg
  Filled 2013-12-08 (×8): qty 1

## 2013-12-08 MED ORDER — ONDANSETRON HCL 4 MG/2ML IJ SOLN
4.0000 mg | Freq: Four times a day (QID) | INTRAMUSCULAR | Status: DC | PRN
  Administered 2013-12-08: 4 mg via INTRAVENOUS
  Filled 2013-12-08: qty 2

## 2013-12-08 NOTE — Progress Notes (Signed)
CCM notified of heart rates 36 to 54 sinus brady. Pt asymptomatic. No new orders. Continue to monitor closely.

## 2013-12-08 NOTE — Progress Notes (Signed)
Pt states he feels like he is at sea. Pt states he feels dizzy and nauseated. Called and notified Dr. Joya Gaskins and received an order for Zofran IV PRN. Pt received and is now resting with sitter at bedside. Pt is still confused at times but his mentation has improved greatly from yesterday. Will pass on. Another nurse picking up at 3 pm.

## 2013-12-08 NOTE — Plan of Care (Signed)
Problem: Phase I Progression Outcomes Goal: Initiate hyperglycemia protocol as indicated Outcome: Completed/Met Date Met:  12/08/13 Goal: Progressing towards optiumm acitivities Outcome: Progressing Goal: Optimized method of communication. Outcome: Progressing Goal: Initial discharge plan identified Outcome: Progressing Goal: Voiding-avoid urinary catheter unless indicated Outcome: Progressing     
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
## 2013-12-09 LAB — GLUCOSE, CAPILLARY
GLUCOSE-CAPILLARY: 97 mg/dL (ref 70–99)
GLUCOSE-CAPILLARY: 96 mg/dL (ref 70–99)
GLUCOSE-CAPILLARY: 120 mg/dL — AB (ref 70–99)
GLUCOSE-CAPILLARY: 128 mg/dL — AB (ref 70–99)
Glucose-Capillary: 113 mg/dL — ABNORMAL HIGH (ref 70–99)
Glucose-Capillary: 108 mg/dL — ABNORMAL HIGH (ref 70–99)

## 2013-12-09 MED ORDER — CLONIDINE HCL 0.2 MG PO TABS
0.2000 mg | ORAL_TABLET | Freq: Three times a day (TID) | ORAL | Status: DC
  Administered 2013-12-09 – 2013-12-15 (×17): 0.2 mg
  Filled 2013-12-09 (×20): qty 1
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
## 2013-12-09 NOTE — Progress Notes (Signed)
Changed ATC setup. 
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
## 2013-12-10 LAB — BASIC METABOLIC PANEL
Anion gap: 9 (ref 5–15)
BUN: 34 mg/dL — ABNORMAL HIGH (ref 6–23)
CALCIUM: 8.7 mg/dL (ref 8.4–10.5)
CO2: 27 mEq/L (ref 19–32)
Chloride: 102 mEq/L (ref 96–112)
Creatinine, Ser: 0.58 mg/dL (ref 0.50–1.35)
GLUCOSE: 111 mg/dL — AB (ref 70–99)
Potassium: 3.8 mEq/L (ref 3.7–5.3)
SODIUM: 138 meq/L (ref 137–147)

## 2013-12-10 LAB — GLUCOSE, CAPILLARY
GLUCOSE-CAPILLARY: 106 mg/dL — AB (ref 70–99)
GLUCOSE-CAPILLARY: 86 mg/dL (ref 70–99)
GLUCOSE-CAPILLARY: 87 mg/dL (ref 70–99)
Glucose-Capillary: 114 mg/dL — ABNORMAL HIGH (ref 70–99)
Glucose-Capillary: 105 mg/dL — ABNORMAL HIGH (ref 70–99)
Glucose-Capillary: 108 mg/dL — ABNORMAL HIGH (ref 70–99)
Glucose-Capillary: 127 mg/dL — ABNORMAL HIGH (ref 70–99)

## 2013-12-10 MED ORDER — WHITE PETROLATUM GEL
Status: AC
  Administered 2013-12-10: 20:00:00
  Filled 2013-12-10: qty 5

## 2013-12-10 NOTE — Plan of Care (Signed)
Problem: ICU Phase Progression Outcomes Goal: Dyspnea controlled at rest Outcome: Progressing Goal: Pain controlled with appropriate interventions Outcome: Progressing
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
## 2013-12-10 NOTE — Progress Notes (Signed)
SLP Cancellation Note  Patient Details Name: Rick Church MRN: [id number] DOB: 04-18-1940   Cancelled treatment:       Reason Eval/Treat Not Completed: Other (comment). Orders received for reassessment of swallow function after trach downsize. Per RN, patient is awaiting trach downsize any moment. Will return for dysphagia treatment and readiness for repeat objective testing at another time.   Germain Osgood, M.A. CCC-SLP [phone]  Germain Osgood 12/10/2013, 2:12 PM

## 2013-12-10 NOTE — Progress Notes (Signed)
Received prescreen request from PT for inpatient rehab and have reviewed pt's case. I noted pt's history and current diagnosis and that pt has [employer]. At this time, based on pt's current pneumonia and deconditioning diagnosis, it is not likely that [facility] Medicare would give authorization for inpatient rehab. We would recommend that skilled nursing be pursued.  Thank you.  Nanetta Batty, PT Rehabilitation Admissions Coordinator [phone]

## 2013-12-10 NOTE — Progress Notes (Signed)
Changed trach per MD order. Removed #6 cuffed Shiley & inserted #4 cuffless Shiley. Placement confirmed by capnography color change & bilateral breath sounds. Pt tolerated procedure without complication. RN at bedside.

## 2013-12-11 ENCOUNTER — Inpatient Hospital Stay (HOSPITAL_COMMUNITY): Payer: Medicare Other

## 2013-12-11 LAB — BASIC METABOLIC PANEL
Anion gap: 11 (ref 5–15)
BUN: 27 mg/dL — ABNORMAL HIGH (ref 6–23)
CO2: 27 mEq/L (ref 19–32)
Calcium: 8.9 mg/dL (ref 8.4–10.5)
Chloride: 101 mEq/L (ref 96–112)
Creatinine, Ser: 0.59 mg/dL (ref 0.50–1.35)
GFR calc Af Amer: >90 mL/min (ref >90)
GFR calc non Af Amer: >90 mL/min (ref >90)
Glucose, Bld: 86 mg/dL (ref 70–99)
POTASSIUM: 4.2 meq/L (ref 3.7–5.3)
SODIUM: 139 meq/L (ref 137–147)

## 2013-12-11 LAB — GLUCOSE, CAPILLARY
Glucose-Capillary: 95 mg/dL (ref 70–99)
Glucose-Capillary: 101 mg/dL — ABNORMAL HIGH (ref 70–99)
Glucose-Capillary: 94 mg/dL (ref 70–99)
Glucose-Capillary: 87 mg/dL (ref 70–99)
Glucose-Capillary: 104 mg/dL — ABNORMAL HIGH (ref 70–99)

## 2013-12-11 MED ORDER — RESOURCE THICKENUP CLEAR PO POWD
ORAL | Status: DC | PRN
  Filled 2013-12-11 (×3): qty 125

## 2013-12-11 NOTE — Progress Notes (Signed)
Speech Language Pathology  Patient Details Name: Rick Church MRN: [id number] DOB: 1940/01/14 Today's Date: 12/11/2013 Time:  -      MBS scheduled today at 11:30.   [name].Ed [employer] [phone]

## 2013-12-11 NOTE — Plan of Care (Signed)
Problem: Phase I Progression Outcomes Goal: Baseline oxygen/pH stable Outcome: Progressing
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
## 2013-12-12 LAB — GLUCOSE, CAPILLARY
GLUCOSE-CAPILLARY: 111 mg/dL — AB (ref 70–99)
GLUCOSE-CAPILLARY: 101 mg/dL — AB (ref 70–99)
GLUCOSE-CAPILLARY: 109 mg/dL — AB (ref 70–99)
Glucose-Capillary: 116 mg/dL — ABNORMAL HIGH (ref 70–99)
Glucose-Capillary: 128 mg/dL — ABNORMAL HIGH (ref 70–99)
Glucose-Capillary: 99 mg/dL (ref 70–99)

## 2013-12-12 LAB — CBC
HCT: 31.9 % — ABNORMAL LOW (ref 39.0–52.0)
Hemoglobin: 10.1 g/dL — ABNORMAL LOW (ref 13.0–17.0)
MCH: 29.9 pg (ref 26.0–34.0)
MCHC: 31.7 g/dL (ref 30.0–36.0)
MCV: 94.4 fL (ref 78.0–100.0)
PLATELETS: 274 10*3/uL (ref 150–400)
RBC: 3.38 MIL/uL — ABNORMAL LOW (ref 4.22–5.81)
RDW: 15.6 % — AB (ref 11.5–15.5)
WBC: 7.9 10*3/uL (ref 4.0–10.5)

## 2013-12-12 LAB — BASIC METABOLIC PANEL
ANION GAP: 9 (ref 5–15)
BUN: 28 mg/dL — ABNORMAL HIGH (ref 6–23)
CALCIUM: 9 mg/dL (ref 8.4–10.5)
CO2: 28 mEq/L (ref 19–32)
CREATININE: 0.66 mg/dL (ref 0.50–1.35)
Chloride: 101 mEq/L (ref 96–112)
GFR calc Af Amer: >90 mL/min (ref >90)
Glucose, Bld: 96 mg/dL (ref 70–99)
Potassium: 4.4 mEq/L (ref 3.7–5.3)
SODIUM: 138 meq/L (ref 137–147)

## 2013-12-12 LAB — MAGNESIUM: MAGNESIUM: 2.1 mg/dL (ref 1.5–2.5)

## 2013-12-12 LAB — PHOSPHORUS: Phosphorus: 4.2 mg/dL (ref 2.3–4.6)
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
## 2013-12-12 NOTE — Progress Notes (Addendum)
RN called for change in patient. Upon arrival RN had removed cap and replaced inner cannula. Sats were 87-88% back and forth, I put patient back on 35% trach collar and suctioned patient (small, tan sticky secretions) Patient sats came up to 93%. Patient seemed confused and lethargic. Thinks he is at [employer]. RN aware. Will continue to monitor.
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
## 2013-12-12 NOTE — Progress Notes (Signed)
Patient placed back on 4 Lpm nasal cannula and trach capped off. Patient is back to previous state without confusion. RN aware.
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
## 2013-12-12 NOTE — Clinical Social Work Note (Signed)
CSW provided patient will bed offers.  Patient would prefer to stay in [place].  Dorian Pod with [facility] wanted to meet with patient to discuss rehab- patient agreed to meet with her.  CSW will continue to follow.  Domenica Reamer, [place] Social Worker [phone]

## 2013-12-12 NOTE — Progress Notes (Signed)
Trach consult note:  Pt seen at this time for trach consult.  Pt on 35% ATC tolerating well. #4cfls secure.  Per CSW possible discharge tomorrow to SNF.  Will continue to follow pt for progress if not discharged. No education needed at this time. No family at bedside.

## 2013-12-12 NOTE — Clinical Social Work Note (Signed)
Patient back on 35% trach collar.  Needs to be on 28% for 48hours before [facility] to [facility].  CSW will continue to follow.  Domenica Reamer, [place] Social Worker [phone]

## 2013-12-12 NOTE — Progress Notes (Signed)
RT capped trach per MD order. Nasal cannula placed on patient at 5 Lpm nasal cannula with sats at 96%. RN aware. Patient comfortable.

## 2013-12-12 NOTE — Plan of Care (Signed)
Problem: Phase I Progression Outcomes Goal: Baseline oxygen/pH stable Outcome: Progressing  Problem: Phase II Progression Outcomes Goal: Pain controlled Outcome: Progressing

## 2013-12-12 NOTE — Trach Care Team (Signed)
Trach Care Progression Note   Patient Details Name: Rick Church MRN: [id number] DOB: 1940/07/26 Today's Date: 12/12/2013   Tracheostomy Assessment    Tracheostomy Shiley 4 mm Uncuffed (Active)  Status Capped 12/12/2013 12:05 PM  Site Assessment Clean;Dry 12/12/2013 12:05 PM  Site Care Protective barrier to skin 12/12/2013 12:05 PM  Inner Cannula Care No inner cannula 12/12/2013 12:05 PM  Ties Assessment Clean;Dry;Secure 12/12/2013 12:05 PM  Cuff pressure (cm) 0 cm 12/12/2013  3:20 AM  Trach Changed Yes 12/10/2013  3:41 PM  Emergency Equipment at bedside Yes 12/12/2013 12:05 PM     Care Needs     Respiratory Therapy O2 Device: Nasal Cannula FiO2 (%): 28 % SpO2: 98 % Trach capped today. Possible decannulation.    Speech Language Pathology  SLP chart review complete: Patient currently receiving at SLP services Patient may use Passy-Muir Speech Valve: During all waking hours (remove during sleep), During PO intake/meals, During all therapies with supervision PMSV Supervision: Intermittent Proceed with Objective Swallowing Evaluation:  (completed MBS and FEES, on Dys 2/nectar teaspoon) Recommendations for Other Services: Rehab consult, OT consult, PT consult Follow up Recommendations: Inpatient Rehab   Physical Therapy PT Recommendation/Assessment: Patient needs continued PT services Follow Up Recommendations: CIR PT equipment: Other (comment) (tbd)    Occupational Therapy      Nutritional Patient's Current Diet: NPO, Tube feeding Tube Feeding: Vital AF 1.2 Cal Tube Feeding Frequency: Continuous Tube Feeding Strength: Full strength Diet Recommendations: Dysphagia 2 (Fine chop), Nectar-thick liquid (from spoon only - no cup/straw)    Case Management/Social Work  Pt from home. Possible rehab needs before going back home.    Provider [place] Team/Provider Recommendations                                     [facility] Team Members Present-  Ciro Backer, RT, Alvino Blood,  SW, Molli Barrows, RD,     Consider swallow evaluation after decannulation.           Tziporah Knoke, Jaci Carrel (scribe for team) 12/12/2013, 2:12 PM
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
## 2013-12-12 NOTE — Clinical Social Work Placement (Addendum)
Clinical Social Work Department CLINICAL SOCIAL WORK PLACEMENT NOTE 12/12/2013  Patient:  Rick Church, Rick Church  Account Number:  [id number] Admit date:  11/14/2013  Clinical Social Worker:  Domenica Reamer, CLINICAL SOCIAL WORKER  Date/time:  12/11/2013 03:00 PM  Clinical Social Work is seeking post-discharge placement for this patient at the following level of care:   SKILLED NURSING   (*CSW will update this form in Epic as items are completed)   12/11/2013  Patient/family provided with [place] Department of Clinical Social Work's list of facilities offering this level of care within the geographic area requested by the patient (or if unable, by the patient's family).  12/11/2013  Patient/family informed of their freedom to choose among providers that offer the needed level of care, that participate in Medicare, Medicaid or managed care program needed by the patient, have an available bed and are willing to accept the patient.  12/11/2013  Patient/family informed of [facility]' ownership interest in [facility], as well as of the fact that they are under no obligation to receive care at this facility.  PASARR submitted to EDS on 12/11/2013 PASARR number received on 12/11/2013  FL2 transmitted to all facilities in geographic area requested by pt/family on  12/11/2013 FL2 transmitted to all facilities within larger geographic area on   Patient informed that his/her managed care company has contracts with or will negotiate with  certain facilities, including the following:     Patient/family informed of bed offers received:  12/14/2013 Patient chooses bed at [facility] Physician recommends and patient chooses bed at    Patient to be transferred to [facility] on  12/7 ([place], [place], [phone]) Patient to be transferred to facility by [facility], [place], [phone]) Patient and family notified of transfer on 12/7 ([place], [place],  [place]) Name of family member notified:  Wille Glaser (brother) Eliezer Lofts [place], [place], [phone])  The following physician request were entered in Epic:   Additional Comments: Domenica Reamer, [place] Worker [phone]

## 2013-12-12 NOTE — Progress Notes (Signed)
SHIFT SUMMARY:  Rick Church was capped by RT and pt tolerated it well.  He was up in the chair.  After lunch, pt complaint of being nauseous and not feeling well.  O2 saturation stays between 89 to 92.  Assisted him back to bed and capped was removed.  RT was paged and she came to pt's room.  Pt. Claimed that he is not feeling good and went to sleep.    Before supper, pt wants to get out of bed and sits on the chair.  Rick Church was capped again by RT and tolerated it well. No complaint of nausea.  Did not eat any dinner, NGT has been discontinued this afternoon.  Will continue to monitor.

## 2013-12-13 LAB — CBC
HEMATOCRIT: 32.5 % — AB (ref 39.0–52.0)
Hemoglobin: 10.3 g/dL — ABNORMAL LOW (ref 13.0–17.0)
MCH: 29.7 pg (ref 26.0–34.0)
MCHC: 31.7 g/dL (ref 30.0–36.0)
MCV: 93.7 fL (ref 78.0–100.0)
Platelets: 253 10*3/uL (ref 150–400)
RBC: 3.47 MIL/uL — ABNORMAL LOW (ref 4.22–5.81)
RDW: 15.7 % — ABNORMAL HIGH (ref 11.5–15.5)
WBC: 9 10*3/uL (ref 4.0–10.5)

## 2013-12-13 LAB — BASIC METABOLIC PANEL
Anion gap: 13 (ref 5–15)
BUN: 26 mg/dL — AB (ref 6–23)
CHLORIDE: 98 meq/L (ref 96–112)
CO2: 25 mEq/L (ref 19–32)
Calcium: 9.2 mg/dL (ref 8.4–10.5)
Creatinine, Ser: 0.61 mg/dL (ref 0.50–1.35)
GFR calc Af Amer: >90 mL/min (ref >90)
Glucose, Bld: 69 mg/dL — ABNORMAL LOW (ref 70–99)
POTASSIUM: 4.5 meq/L (ref 3.7–5.3)
SODIUM: 136 meq/L — AB (ref 137–147)

## 2013-12-13 LAB — MAGNESIUM: MAGNESIUM: 2.2 mg/dL (ref 1.5–2.5)

## 2013-12-13 LAB — GLUCOSE, CAPILLARY
GLUCOSE-CAPILLARY: 99 mg/dL (ref 70–99)
GLUCOSE-CAPILLARY: 105 mg/dL — AB (ref 70–99)
GLUCOSE-CAPILLARY: 92 mg/dL (ref 70–99)
Glucose-Capillary: 170 mg/dL — ABNORMAL HIGH (ref 70–99)
Glucose-Capillary: 81 mg/dL (ref 70–99)
Glucose-Capillary: 95 mg/dL (ref 70–99)

## 2013-12-13 LAB — PHOSPHORUS: PHOSPHORUS: 5 mg/dL — AB (ref 2.3–4.6)

## 2013-12-13 MED ORDER — FAMOTIDINE 20 MG PO TABS
20.0000 mg | ORAL_TABLET | Freq: Two times a day (BID) | ORAL | Status: DC
Start: 2013-12-13 — End: 2013-12-16
  Administered 2013-12-13 – 2013-12-16 (×6): 20 mg via ORAL
  Filled 2013-12-13 (×8): qty 1

## 2013-12-13 MED ORDER — FENTANYL 50 MCG/HR TD PT72
MEDICATED_PATCH | TRANSDERMAL | Status: AC
  Filled 2013-12-13: qty 1

## 2013-12-13 MED ORDER — DOCUSATE SODIUM 50 MG PO CAPS
50.0000 mg | ORAL_CAPSULE | Freq: Two times a day (BID) | ORAL | Status: DC
  Administered 2013-12-13 – 2013-12-16 (×6): 50 mg via ORAL
  Filled 2013-12-13 (×7): qty 1

## 2013-12-13 NOTE — Plan of Care (Signed)
Problem: ICU Phase Progression Outcomes Goal: O2 sats trending toward baseline Outcome: Completed/Met Date Met:  12/13/13 Goal: Dyspnea controlled at rest Outcome: Completed/Met Date Met:  12/13/13 Goal: Hemodynamically stable Outcome: Completed/Met Date Met:  12/13/13 Goal: Pain controlled with appropriate interventions Outcome: Completed/Met Date Met:  12/13/13 Goal: Voiding-avoid urinary catheter unless indicated Outcome: Completed/Met Date Met:  12/13/13
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
## 2013-12-13 NOTE — Progress Notes (Addendum)
Patient decannulated per MD order and gauze placed over stoma. Patient on 2L nasal cannula. Patient vitals stable. No complications noted. Will continue to monitor.

## 2013-12-13 NOTE — Progress Notes (Signed)
Medicare Important Message given to patient.

## 2013-12-13 NOTE — Progress Notes (Signed)
Patient has been decanulated and has been doing well with continuous O2 per [place] at 2.5 L.  Up in chair at this time.

## 2013-12-14 LAB — BASIC METABOLIC PANEL
Anion gap: 11 (ref 5–15)
BUN: 26 mg/dL — ABNORMAL HIGH (ref 6–23)
CHLORIDE: 98 meq/L (ref 96–112)
CO2: 29 mEq/L (ref 19–32)
CREATININE: 0.75 mg/dL (ref 0.50–1.35)
Calcium: 9.3 mg/dL (ref 8.4–10.5)
GFR calc Af Amer: >90 mL/min (ref >90)
GFR calc non Af Amer: >90 mL/min (ref >90)
GLUCOSE: 91 mg/dL (ref 70–99)
POTASSIUM: 4.4 meq/L (ref 3.7–5.3)
Sodium: 138 mEq/L (ref 137–147)

## 2013-12-14 LAB — GLUCOSE, CAPILLARY
GLUCOSE-CAPILLARY: 107 mg/dL — AB (ref 70–99)
GLUCOSE-CAPILLARY: 160 mg/dL — AB (ref 70–99)
GLUCOSE-CAPILLARY: 76 mg/dL (ref 70–99)
GLUCOSE-CAPILLARY: 90 mg/dL (ref 70–99)
Glucose-Capillary: 101 mg/dL — ABNORMAL HIGH (ref 70–99)
Glucose-Capillary: 80 mg/dL (ref 70–99)

## 2013-12-14 LAB — PHOSPHORUS: PHOSPHORUS: 4.7 mg/dL — AB (ref 2.3–4.6)

## 2013-12-14 LAB — MAGNESIUM: Magnesium: 2.3 mg/dL (ref 1.5–2.5)
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
## 2013-12-14 NOTE — Progress Notes (Signed)
Received on report pt's foley removed yesterday. Pt has not urinated on his own since then. Night nurse reported they completed an In and Out cath and received 500 cc of urine at 0330 am. Pt has not urinated since. Nurse tech completed a bladder scan and it showed 150 cc of urine in bladder. Called Dr. Elsworth Soho and reported. Dr. Gwenette Greet will be rounding and nurse will report to him. Will monitor.

## 2013-12-14 NOTE — Progress Notes (Signed)
RN calling elink  Recurrent urinary retention  Plan In and out foley order sent  Dr. Brand Males, M.D., [facility].C.P Pulmonary and Critical Care Medicine Staff Physician [facility] Pager: [phone], If no answer or between  15:00h - 7:00h: call [phone]  12/14/2013 3:06 AM

## 2013-12-14 NOTE — Plan of Care (Signed)
Problem: ICU Phase Progression Outcomes Goal: Initial discharge plan identified Outcome: Progressing  Problem: Phase I Progression Outcomes Goal: Hemodynamically stable Outcome: Completed/Met Date Met:  12/14/13 Goal: Pain controlled Outcome: Progressing Goal: Progress activity as tolerated unless otherwise ordered Outcome: Progressing

## 2013-12-14 NOTE — Clinical Social Work Note (Signed)
CSW continues to follow patient for d/c planning needs. CSW met with patient who was alert and oriented. CSW provided current bed offers: [facility], [facility]. Patient has accepted bed offer with [facility] as he states, facility is 3 minutes or less from his home. CSW contacted facility and made Tammy aware. CSW to continue to follow.  [place], [place] Weekend Clinical Social Worker [phone]

## 2013-12-14 NOTE — Progress Notes (Signed)
Pt's foley catheter was removed earlier in the day.  A bladder scan was completed at 1800 showing 69ml of urine.Pt had not yet voided at change of shift. At 0200 pt was bladders canned again showing 239ml. PCCM notified and an order was obtained to In and Out cath the pt. I/O completed resulting in 554ml of urine. Will continue to monitor for further urine retention.

## 2013-12-15 LAB — GLUCOSE, CAPILLARY
Glucose-Capillary: 102 mg/dL — ABNORMAL HIGH (ref 70–99)
Glucose-Capillary: 123 mg/dL — ABNORMAL HIGH (ref 70–99)
Glucose-Capillary: 142 mg/dL — ABNORMAL HIGH (ref 70–99)
Glucose-Capillary: 78 mg/dL (ref 70–99)
Glucose-Capillary: 85 mg/dL (ref 70–99)
Glucose-Capillary: 99 mg/dL (ref 70–99)

## 2013-12-15 NOTE — Progress Notes (Signed)
Pt was confused all day today. Pt became verbally aggressive at around supper time. Gave pt haldol per md orders. Tried orienting pt prior to giving haldol but pt was refusing medication and going to tear out IV line etcetera. After pt received haldol pt stated " I'm sorry for being a jerk". Pt then resting without complaints. Pt is not taking in enough fluids or food. Nurse tried to encourage pt to eat and drink today. Notified night shift.
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
## 2013-12-16 LAB — MRSA PCR SCREENING: MRSA BY PCR: NEGATIVE

## 2013-12-16 LAB — GLUCOSE, CAPILLARY: GLUCOSE-CAPILLARY: 96 mg/dL (ref 70–99)

## 2013-12-16 MED ORDER — FENTANYL 50 MCG/HR TD PT72: 50.0000 ug | MEDICATED_PATCH | TRANSDERMAL | Status: DC

## 2013-12-16 MED ORDER — BUDESONIDE 0.5 MG/2ML IN SUSP: 0.5000 mg | Freq: Two times a day (BID) | RESPIRATORY_TRACT | Status: DC

## 2013-12-16 MED ORDER — CITALOPRAM HYDROBROMIDE 10 MG PO TABS: 10.0000 mg | ORAL_TABLET | Freq: Every day | ORAL | Status: DC

## 2013-12-16 MED ORDER — IPRATROPIUM-ALBUTEROL 0.5-2.5 (3) MG/3ML IN SOLN
3.0000 mL | Freq: Four times a day (QID) | RESPIRATORY_TRACT | Status: DC
Start: 2013-12-16 — End: 2014-07-09

## 2013-12-16 MED ORDER — CETYLPYRIDINIUM CHLORIDE 0.05 % MT LIQD
7.0000 mL | Freq: Two times a day (BID) | OROMUCOSAL | Status: DC
  Administered 2013-12-16: 7 mL via OROMUCOSAL

## 2013-12-16 MED ORDER — CLONAZEPAM 2 MG PO TABS: 2.0000 mg | ORAL_TABLET | Freq: Two times a day (BID) | ORAL | Status: DC

## 2013-12-16 MED ORDER — CLONIDINE HCL 0.2 MG PO TABS: 0.2000 mg | ORAL_TABLET | Freq: Three times a day (TID) | ORAL | Status: DC

## 2013-12-16 MED ORDER — CITALOPRAM HYDROBROMIDE 10 MG PO TABS
10.0000 mg | ORAL_TABLET | Freq: Every day | ORAL | Status: DC
  Administered 2013-12-16: 10 mg via ORAL
  Filled 2013-12-16: qty 1

## 2013-12-16 MED ORDER — FAMOTIDINE 20 MG PO TABS: 20.0000 mg | ORAL_TABLET | Freq: Two times a day (BID) | ORAL | Status: DC

## 2013-12-16 MED ORDER — CLONIDINE HCL 0.2 MG PO TABS
0.2000 mg | ORAL_TABLET | Freq: Three times a day (TID) | ORAL | Status: DC
  Administered 2013-12-16 (×2): 0.2 mg via ORAL
  Filled 2013-12-16 (×3): qty 1

## 2013-12-16 MED ORDER — ACETAMINOPHEN 160 MG/5ML PO SOLN
650.0000 mg | Freq: Four times a day (QID) | ORAL | Status: DC | PRN
  Administered 2013-12-16: 650 mg via ORAL
  Filled 2013-12-16: qty 20.3

## 2013-12-16 MED ORDER — CLONAZEPAM 1 MG PO TABS
2.0000 mg | ORAL_TABLET | Freq: Two times a day (BID) | ORAL | Status: DC
  Administered 2013-12-16: 2 mg via ORAL
  Filled 2013-12-16: qty 2

## 2013-12-16 NOTE — Progress Notes (Addendum)
Pt discharged per PTAR with all belongings to [facility] in [place] has personal items like cell phone.

## 2013-12-16 NOTE — Clinical Social Work Note (Signed)
Patient is able to DC to [facility] once PT has evaluated- PT is to see patient this afternoon- facility needs PT eval to complete insurance auth.  CSW will continue to follow.  Domenica Reamer, [place] Social Worker [phone]
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
## 2013-12-16 NOTE — Plan of Care (Signed)
Problem: SLP Dysphagia Goals Goal: Patient will utilize recommended strategies Patient will utilize recommended strategies during swallow to increase swallowing safety with  Outcome: Progressing     
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
## 2013-12-16 NOTE — Plan of Care (Signed)
Problem: SLP Dysphagia Goals Goal: Patient will demonstrate readiness for PO's Patient will demonstrate readiness for PO's and/or instrumental swallow study as evidenced by:  Outcome: Completed/Met Date Met:  12/16/13
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
## 2013-12-16 NOTE — Clinical Social Work Note (Signed)
Patient will discharge to [facility] Anticipated discharge date:12/7 Family notified: Wille Glaser (brother) Transportation by [employer]- called at 3:00pm  CSW signing off.  Domenica Reamer, [place] Social Worker [phone]

## 2013-12-16 NOTE — Progress Notes (Signed)
Called report to [facility] spoke with Bronson Curb RN. Aware that pt had foley catheter d/c'd at 1330 Encouraged po fluids with lunch. Sitting up in chair alert today and watching  TV calling brother on the phone

## 2013-12-17 ENCOUNTER — Non-Acute Institutional Stay (SKILLED_NURSING_FACILITY): Payer: Medicare Other | Admitting: Internal Medicine

## 2013-12-17 DIAGNOSIS — I5032 Chronic diastolic (congestive) heart failure: Secondary | ICD-10-CM | POA: Diagnosis not present

## 2013-12-17 DIAGNOSIS — J439 Emphysema, unspecified: Secondary | ICD-10-CM

## 2013-12-17 DIAGNOSIS — E440 Moderate protein-calorie malnutrition: Secondary | ICD-10-CM

## 2013-12-17 DIAGNOSIS — R339 Retention of urine, unspecified: Secondary | ICD-10-CM | POA: Diagnosis not present

## 2013-12-17 DIAGNOSIS — F410 Panic disorder [episodic paroxysmal anxiety] without agoraphobia: Secondary | ICD-10-CM

## 2013-12-17 DIAGNOSIS — J9620 Acute and chronic respiratory failure, unspecified whether with hypoxia or hypercapnia: Secondary | ICD-10-CM | POA: Diagnosis not present
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
## 2013-12-19 ENCOUNTER — Other Ambulatory Visit (HOSPITAL_COMMUNITY): Payer: Self-pay | Admitting: Internal Medicine

## 2013-12-19 DIAGNOSIS — R1314 Dysphagia, pharyngoesophageal phase: Secondary | ICD-10-CM

## 2013-12-26 ENCOUNTER — Other Ambulatory Visit (HOSPITAL_COMMUNITY): Payer: Self-pay | Admitting: Internal Medicine

## 2013-12-26 DIAGNOSIS — R1314 Dysphagia, pharyngoesophageal phase: Secondary | ICD-10-CM

## 2013-12-27 ENCOUNTER — Ambulatory Visit (HOSPITAL_COMMUNITY): Payer: Medicare Other

## 2013-12-27 ENCOUNTER — Ambulatory Visit (HOSPITAL_COMMUNITY)
Admission: RE | Admit: 2013-12-27 | Discharge: 2013-12-27 | Disposition: A | Discharge status: Home / Self Care | Payer: Medicare Other | Source: Ambulatory Visit | Attending: Internal Medicine | Admitting: Internal Medicine

## 2013-12-27 DIAGNOSIS — E780 Pure hypercholesterolemia: Secondary | ICD-10-CM | POA: Diagnosis not present

## 2013-12-27 DIAGNOSIS — Z930 Tracheostomy status: Secondary | ICD-10-CM | POA: Insufficient documentation

## 2013-12-27 DIAGNOSIS — J439 Emphysema, unspecified: Secondary | ICD-10-CM | POA: Insufficient documentation

## 2013-12-27 DIAGNOSIS — J449 Chronic obstructive pulmonary disease, unspecified: Secondary | ICD-10-CM | POA: Diagnosis not present

## 2013-12-27 DIAGNOSIS — I10 Essential (primary) hypertension: Secondary | ICD-10-CM | POA: Diagnosis not present

## 2013-12-27 DIAGNOSIS — F17210 Nicotine dependence, cigarettes, uncomplicated: Secondary | ICD-10-CM | POA: Insufficient documentation

## 2013-12-27 DIAGNOSIS — R1312 Dysphagia, oropharyngeal phase: Secondary | ICD-10-CM | POA: Insufficient documentation

## 2013-12-27 DIAGNOSIS — R1310 Dysphagia, unspecified: Secondary | ICD-10-CM | POA: Diagnosis present

## 2013-12-27 DIAGNOSIS — R1314 Dysphagia, pharyngoesophageal phase: Secondary | ICD-10-CM
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
## 2013-12-31 ENCOUNTER — Emergency Department (HOSPITAL_COMMUNITY): Payer: Medicare Other

## 2013-12-31 ENCOUNTER — Inpatient Hospital Stay (HOSPITAL_COMMUNITY)
Admission: EM | Admit: 2013-12-31 | Discharge: 2014-01-08 | DRG: 871 | Disposition: A | Discharge status: Home Health | Payer: Medicare Other | Attending: Internal Medicine | Admitting: Internal Medicine

## 2013-12-31 ENCOUNTER — Encounter (HOSPITAL_COMMUNITY): Payer: Self-pay | Admitting: Emergency Medicine

## 2013-12-31 DIAGNOSIS — I2510 Atherosclerotic heart disease of native coronary artery without angina pectoris: Secondary | ICD-10-CM | POA: Diagnosis present

## 2013-12-31 DIAGNOSIS — J439 Emphysema, unspecified: Secondary | ICD-10-CM | POA: Diagnosis present

## 2013-12-31 DIAGNOSIS — Z66 Do not resuscitate: Secondary | ICD-10-CM | POA: Diagnosis present

## 2013-12-31 DIAGNOSIS — I5031 Acute diastolic (congestive) heart failure: Secondary | ICD-10-CM | POA: Diagnosis present

## 2013-12-31 DIAGNOSIS — Z8701 Personal history of pneumonia (recurrent): Secondary | ICD-10-CM

## 2013-12-31 DIAGNOSIS — J449 Chronic obstructive pulmonary disease, unspecified: Secondary | ICD-10-CM | POA: Diagnosis present

## 2013-12-31 DIAGNOSIS — F17210 Nicotine dependence, cigarettes, uncomplicated: Secondary | ICD-10-CM | POA: Diagnosis present

## 2013-12-31 DIAGNOSIS — E876 Hypokalemia: Secondary | ICD-10-CM | POA: Diagnosis not present

## 2013-12-31 DIAGNOSIS — A419 Sepsis, unspecified organism: Secondary | ICD-10-CM | POA: Diagnosis present

## 2013-12-31 DIAGNOSIS — Z886 Allergy status to analgesic agent status: Secondary | ICD-10-CM

## 2013-12-31 DIAGNOSIS — I10 Essential (primary) hypertension: Secondary | ICD-10-CM

## 2013-12-31 DIAGNOSIS — I350 Nonrheumatic aortic (valve) stenosis: Secondary | ICD-10-CM | POA: Diagnosis present

## 2013-12-31 DIAGNOSIS — D638 Anemia in other chronic diseases classified elsewhere: Secondary | ICD-10-CM | POA: Diagnosis present

## 2013-12-31 DIAGNOSIS — R0602 Shortness of breath: Secondary | ICD-10-CM | POA: Diagnosis present

## 2013-12-31 DIAGNOSIS — J189 Pneumonia, unspecified organism: Secondary | ICD-10-CM | POA: Diagnosis present

## 2013-12-31 DIAGNOSIS — J9600 Acute respiratory failure, unspecified whether with hypoxia or hypercapnia: Secondary | ICD-10-CM | POA: Diagnosis present

## 2013-12-31 DIAGNOSIS — M179 Osteoarthritis of knee, unspecified: Secondary | ICD-10-CM | POA: Diagnosis present

## 2013-12-31 DIAGNOSIS — Y95 Nosocomial condition: Secondary | ICD-10-CM | POA: Diagnosis present

## 2013-12-31 DIAGNOSIS — I214 Non-ST elevation (NSTEMI) myocardial infarction: Secondary | ICD-10-CM | POA: Diagnosis present

## 2013-12-31 DIAGNOSIS — Z930 Tracheostomy status: Secondary | ICD-10-CM | POA: Diagnosis not present

## 2013-12-31 DIAGNOSIS — I248 Other forms of acute ischemic heart disease: Secondary | ICD-10-CM

## 2013-12-31 DIAGNOSIS — Z888 Allergy status to other drugs, medicaments and biological substances status: Secondary | ICD-10-CM | POA: Diagnosis not present

## 2013-12-31 DIAGNOSIS — R6520 Severe sepsis without septic shock: Secondary | ICD-10-CM | POA: Diagnosis present

## 2013-12-31 DIAGNOSIS — E780 Pure hypercholesterolemia: Secondary | ICD-10-CM | POA: Diagnosis present

## 2013-12-31 DIAGNOSIS — E785 Hyperlipidemia, unspecified: Secondary | ICD-10-CM | POA: Diagnosis present

## 2013-12-31 DIAGNOSIS — I2489 Other forms of acute ischemic heart disease: Secondary | ICD-10-CM | POA: Diagnosis present

## 2013-12-31 DIAGNOSIS — A4189 Other specified sepsis: Secondary | ICD-10-CM

## 2013-12-31 DIAGNOSIS — J9601 Acute respiratory failure with hypoxia: Secondary | ICD-10-CM | POA: Diagnosis present

## 2013-12-31 HISTORY — DX: Acute respiratory distress syndrome: J80

## 2013-12-31 HISTORY — DX: Pneumonia, unspecified organism: J18.9

## 2013-12-31 HISTORY — DX: Chronic obstructive pulmonary disease, unspecified: J44.9

## 2013-12-31 LAB — I-STAT ARTERIAL BLOOD GAS, ED
Bicarbonate: 23.8 mEq/L (ref 20.0–24.0)
O2 Saturation: 93 %
PO2 ART: 71 mmHg — AB (ref 80.0–100.0)
Patient temperature: 100.5
TCO2: 25 mmol/L (ref 0–100)
pCO2 arterial: 38.2 mmHg (ref 35.0–45.0)
pH, Arterial: 7.407 (ref 7.350–7.450)

## 2013-12-31 LAB — COMPREHENSIVE METABOLIC PANEL
ALK PHOS: 80 U/L (ref 39–117)
ALT: 20 U/L (ref 0–53)
ALT: 19 U/L (ref 0–53)
ANION GAP: 7 (ref 5–15)
AST: 37 U/L (ref 0–37)
AST: 34 U/L (ref 0–37)
Albumin: 2.1 g/dL — ABNORMAL LOW (ref 3.5–5.2)
Albumin: 1.9 g/dL — ABNORMAL LOW (ref 3.5–5.2)
Alkaline Phosphatase: 74 U/L (ref 39–117)
Anion gap: 9 (ref 5–15)
BILIRUBIN TOTAL: 0.4 mg/dL (ref 0.3–1.2)
BUN: 19 mg/dL (ref 6–23)
BUN: 18 mg/dL (ref 6–23)
CALCIUM: 7.1 mg/dL — AB (ref 8.4–10.5)
CHLORIDE: 105 meq/L (ref 96–112)
CO2: 24 mmol/L (ref 19–32)
CO2: 26 mmol/L (ref 19–32)
Calcium: 7.4 mg/dL — ABNORMAL LOW (ref 8.4–10.5)
Chloride: 106 mEq/L (ref 96–112)
Creatinine, Ser: 1.09 mg/dL (ref 0.50–1.35)
Creatinine, Ser: 0.98 mg/dL (ref 0.50–1.35)
GFR calc Af Amer: 77 mL/min — ABNORMAL LOW (ref >90)
GFR, EST NON AFRICAN AMERICAN: 66 mL/min — AB (ref >90)
GFR, EST NON AFRICAN AMERICAN: 81 mL/min — AB (ref >90)
GLUCOSE: 136 mg/dL — AB (ref 70–99)
GLUCOSE: 103 mg/dL — AB (ref 70–99)
Potassium: 3.3 mmol/L — ABNORMAL LOW (ref 3.5–5.1)
Potassium: 3.7 mmol/L (ref 3.5–5.1)
SODIUM: 138 mmol/L (ref 135–145)
Sodium: 139 mmol/L (ref 135–145)
Total Bilirubin: 0.5 mg/dL (ref 0.3–1.2)
Total Protein: 4.9 g/dL — ABNORMAL LOW (ref 6.0–8.3)
Total Protein: 4.7 g/dL — ABNORMAL LOW (ref 6.0–8.3)

## 2013-12-31 LAB — CBC WITH DIFFERENTIAL/PLATELET
Basophils Absolute: 0 10*3/uL (ref 0.0–0.1)
Basophils Relative: 0 % (ref 0–1)
Eosinophils Absolute: 0 10*3/uL (ref 0.0–0.7)
Eosinophils Relative: 0 % (ref 0–5)
HEMATOCRIT: 30.2 % — AB (ref 39.0–52.0)
HEMOGLOBIN: 9.5 g/dL — AB (ref 13.0–17.0)
LYMPHS ABS: 2.3 10*3/uL (ref 0.7–4.0)
LYMPHS PCT: 16 % (ref 12–46)
MCH: 28.9 pg (ref 26.0–34.0)
MCHC: 31.5 g/dL (ref 30.0–36.0)
MCV: 91.8 fL (ref 78.0–100.0)
Monocytes Absolute: 1.1 10*3/uL — ABNORMAL HIGH (ref 0.1–1.0)
Monocytes Relative: 8 % (ref 3–12)
NEUTROS ABS: 11.1 10*3/uL — AB (ref 1.7–7.7)
Neutrophils Relative %: 76 % (ref 43–77)
Platelets: 235 10*3/uL (ref 150–400)
RBC: 3.29 MIL/uL — ABNORMAL LOW (ref 4.22–5.81)
RDW: 14.7 % (ref 11.5–15.5)
WBC: 14.5 10*3/uL — ABNORMAL HIGH (ref 4.0–10.5)

## 2013-12-31 LAB — CBC
HCT: 29.4 % — ABNORMAL LOW (ref 39.0–52.0)
HEMOGLOBIN: 9.3 g/dL — AB (ref 13.0–17.0)
MCH: 29.2 pg (ref 26.0–34.0)
MCHC: 31.6 g/dL (ref 30.0–36.0)
MCV: 92.5 fL (ref 78.0–100.0)
Platelets: 198 10*3/uL (ref 150–400)
RBC: 3.18 MIL/uL — ABNORMAL LOW (ref 4.22–5.81)
RDW: 14.7 % (ref 11.5–15.5)
WBC: 13.1 10*3/uL — ABNORMAL HIGH (ref 4.0–10.5)

## 2013-12-31 LAB — LACTIC ACID, PLASMA: Lactic Acid, Venous: 2.2 mmol/L (ref 0.5–2.2)

## 2013-12-31 LAB — URINALYSIS, ROUTINE W REFLEX MICROSCOPIC
Glucose, UA: NEGATIVE mg/dL
HGB URINE DIPSTICK: NEGATIVE
KETONES UR: NEGATIVE mg/dL
LEUKOCYTES UA: NEGATIVE
Nitrite: NEGATIVE
PH: 5 (ref 5.0–8.0)
Protein, ur: NEGATIVE mg/dL
Specific Gravity, Urine: 1.023 (ref 1.005–1.030)
Urobilinogen, UA: 1 mg/dL (ref 0.0–1.0)

## 2013-12-31 LAB — TYPE AND SCREEN
ABO/RH(D): O POS
ANTIBODY SCREEN: NEGATIVE

## 2013-12-31 LAB — I-STAT TROPONIN, ED: Troponin i, poc: 4.09 ng/mL (ref 0.00–0.08)

## 2013-12-31 LAB — PROTIME-INR
INR: 1.45 (ref 0.00–1.49)
Prothrombin Time: 17.8 seconds — ABNORMAL HIGH (ref 11.6–15.2)

## 2013-12-31 LAB — TROPONIN I: Troponin I: 5.89 ng/mL (ref <0.031)

## 2013-12-31 LAB — ABO/RH: ABO/RH(D): O POS

## 2013-12-31 LAB — I-STAT CG4 LACTIC ACID, ED
LACTIC ACID, VENOUS: 2.25 mmol/L — AB (ref 0.5–2.2)
Lactic Acid, Venous: 1.89 mmol/L (ref 0.5–2.2)

## 2013-12-31 LAB — FIBRINOGEN: FIBRINOGEN: 436 mg/dL (ref 204–475)

## 2013-12-31 LAB — APTT: aPTT: 36 seconds (ref 24–37)

## 2013-12-31 MED ORDER — IPRATROPIUM-ALBUTEROL 0.5-2.5 (3) MG/3ML IN SOLN
3.0000 mL | Freq: Four times a day (QID) | RESPIRATORY_TRACT | Status: DC
  Administered 2013-12-31 – 2014-01-08 (×30): 3 mL via RESPIRATORY_TRACT
  Filled 2013-12-31 (×30): qty 3

## 2013-12-31 MED ORDER — HEPARIN (PORCINE) IN NACL 100-0.45 UNIT/ML-% IJ SOLN
2150.0000 [IU]/h | INTRAMUSCULAR | Status: DC
  Administered 2013-12-31: 850 [IU]/h via INTRAVENOUS
  Administered 2014-01-01: 1350 [IU]/h via INTRAVENOUS
  Administered 2014-01-02: 2150 [IU]/h via INTRAVENOUS
  Administered 2014-01-02: 1650 [IU]/h via INTRAVENOUS
  Filled 2013-12-31 (×6): qty 250

## 2013-12-31 MED ORDER — DEXTROSE 5 % IV SOLN: 2.0000 g | Freq: Once | INTRAVENOUS | Status: DC

## 2013-12-31 MED ORDER — HEPARIN BOLUS VIA INFUSION
3500.0000 [IU] | Freq: Once | INTRAVENOUS | Status: AC
  Administered 2013-12-31: 3500 [IU] via INTRAVENOUS
  Filled 2013-12-31: qty 3500

## 2013-12-31 MED ORDER — SODIUM CHLORIDE 0.9 % IV BOLUS (SEPSIS)
1000.0000 mL | INTRAVENOUS | Status: AC
  Administered 2013-12-31 (×2): 1000 mL via INTRAVENOUS

## 2013-12-31 MED ORDER — BUDESONIDE 0.25 MG/2ML IN SUSP
0.2500 mg | Freq: Four times a day (QID) | RESPIRATORY_TRACT | Status: DC
  Filled 2013-12-31 (×2): qty 2

## 2013-12-31 MED ORDER — CLOPIDOGREL BISULFATE 75 MG PO TABS
75.0000 mg | ORAL_TABLET | Freq: Every day | ORAL | Status: DC
  Administered 2013-12-31 – 2014-01-08 (×9): 75 mg via ORAL
  Filled 2013-12-31 (×11): qty 1

## 2013-12-31 MED ORDER — VANCOMYCIN HCL IN DEXTROSE 1-5 GM/200ML-% IV SOLN: 1000.0000 mg | Freq: Once | INTRAVENOUS | Status: DC

## 2013-12-31 MED ORDER — DEXTROSE 5 % IV SOLN
1.0000 g | Freq: Three times a day (TID) | INTRAVENOUS | Status: DC
  Administered 2013-12-31: 1 g via INTRAVENOUS
  Filled 2013-12-31: qty 1

## 2013-12-31 MED ORDER — BUDESONIDE 0.25 MG/2ML IN SUSP
0.2500 mg | Freq: Four times a day (QID) | RESPIRATORY_TRACT | Status: DC
  Administered 2014-01-01 – 2014-01-08 (×29): 0.25 mg via RESPIRATORY_TRACT
  Filled 2013-12-31 (×34): qty 2

## 2013-12-31 MED ORDER — POTASSIUM CHLORIDE IN NACL 20-0.9 MEQ/L-% IV SOLN
INTRAVENOUS | Status: DC
  Administered 2013-12-31 – 2014-01-02 (×3): via INTRAVENOUS
  Filled 2013-12-31 (×7): qty 1000

## 2013-12-31 MED ORDER — DEXTROSE 5 % IV SOLN
2.0000 g | Freq: Three times a day (TID) | INTRAVENOUS | Status: DC
  Administered 2014-01-01 – 2014-01-03 (×8): 2 g via INTRAVENOUS
  Filled 2013-12-31 (×10): qty 2

## 2013-12-31 MED ORDER — VANCOMYCIN HCL IN DEXTROSE 750-5 MG/150ML-% IV SOLN
750.0000 mg | Freq: Two times a day (BID) | INTRAVENOUS | Status: DC
  Administered 2013-12-31 – 2014-01-04 (×8): 750 mg via INTRAVENOUS
  Filled 2013-12-31 (×9): qty 150

## 2013-12-31 NOTE — Progress Notes (Signed)
ANTICOAGULATION CONSULT NOTE - Initial Consult  Pharmacy Consult for Heparin Indication: chest pain/ACS  Allergies  Allergen Reactions  . Codeine Nausea And Vomiting  . Indocin [Indomethacin] Nausea Only    dizzy  . Penicillins Hives  . Asa [Aspirin] Itching and Rash    Patient Measurements:   Heparin Dosing Weight: 62.5 kg  Vital Signs: Temp: 100.5 F (38.1 C) (12/22 1500) Temp Source: Rectal (12/22 1500) BP: 119/55 mmHg (12/22 1930) Pulse Rate: 73 (12/22 1930)  Labs:  Recent Labs  12/31/13 1445 12/31/13 1616 12/31/13 1803  HGB 9.5*  --   --   HCT 30.2*  --   --   PLT 235  --   --   APTT  --   --  36  CREATININE 1.09  --   --   TROPONINI  --  5.89*  --     CrCl cannot be calculated (Unknown ideal weight.).   Medical History: Past Medical History  Diagnosis Date  . High cholesterol   . Hypertension   . Panic attack     Medications:   (Not in a hospital admission) Scheduled:  . clopidogrel  75 mg Oral Daily  . vancomycin  750 mg Intravenous Q12H   Infusions:  . ceFEPime (MAXIPIME) IV Stopped (12/31/13 1855)  . sodium chloride 1,000 mL (12/31/13 1826)    Assessment: 73yo male presents with SOB with several day history of constant aching left sided chest pain. Pharmacy is consulted to dose heparin for ACS/chest pain. Hgb 9.5, Plt 235, trop 5.89.  Goal of Therapy:  Heparin level 0.3-0.7 units/ml Monitor platelets by anticoagulation protocol: Yes   Plan:  Give 1350 units bolus x 1 Start heparin infusion at 850 units/hr Check anti-Xa level in 8 hours and daily while on heparin Continue to monitor H&H and platelets  Monitor s/sx of bleeding  Andrey Cota. Diona Foley, PharmD Clinical Pharmacist Pager [phone] 12/31/2013,7:49 PM

## 2013-12-31 NOTE — ED Notes (Signed)
NOTIFIED DR. POLLINA OF PATIENTS LAB RESULTS CG4+ LACTIC ACID @19 :13 PM .12/31/2013.

## 2013-12-31 NOTE — ED Notes (Signed)
Belongings sent up to pt's room

## 2013-12-31 NOTE — Consult Note (Signed)
Consulting cardiologist: Dr Carlyle Dolly MD  Clinical Summary Rick Church is a 73 y.o.male history of HTN, HL, tobacco abuse, and recent admit earlier this month with respiratory failure due to severe legionella and ARDS with underlying COPD (admit 11/5 to 12/16/13). During that admit elevated troponin to 2, thought to be demand ischemia.  Presents tonight with SOB. Noted to be febrile temp 103.2 by EMS, O2 sats 80% on RA. He reports + productive cough with brown sputum. Describes several day history of constant aching left sided chest pain, worst with breathing and coughing.   11/15/13 Echo LVEF 50-55%, mod LVH, mild AS K 3.3, Cr 1.09, WBC 14.5, Hgb 9.5, lactic acid 1.89, istat trop 4,  ABG 7.4/38/71 CXR new patchy right lung base infiltrate, improved left perihilar opacity.  EKG SR, LAD, 1st degree AV block, diffuse T wave inversions which are new.    Allergies  Allergen Reactions  . Codeine Nausea And Vomiting  . Indocin [Indomethacin] Nausea Only    dizzy  . Penicillins Hives  . Asa [Aspirin] Itching and Rash    Medications Scheduled Medications: . vancomycin  750 mg Intravenous Q12H     Infusions: . ceFEPime (MAXIPIME) IV Stopped (12/31/13 1855)  . sodium chloride 1,000 mL (12/31/13 1826)     PRN Medications:     Past Medical History  Diagnosis Date  . High cholesterol   . Hypertension   . Panic attack     Past Surgical History  Procedure Laterality Date  . Foot neuroma surgery Left 09/29/2101  . Back surgery    . Tracheostomy      feinstein    History reviewed. No pertinent family history.  Social History Mr. Harmening reports that he has been smoking Cigarettes.  He started smoking about 57 years ago. He has been smoking about 1.50 packs per day. He does not have any smokeless tobacco history on file. Mr. Matters reports that he does not drink alcohol.  Review of Systems CONSTITUTIONAL: No weight loss, fever, chills, weakness or fatigue.  HEENT:  Eyes: No visual loss, blurred vision, double vision or yellow sclerae. No hearing loss, sneezing, congestion, runny nose or sore throat.  SKIN: No rash or itching.  CARDIOVASCULAR: per HPI RESPIRATORY: per HPI GASTROINTESTINAL: No anorexia, nausea, vomiting or diarrhea. No abdominal pain or blood.  GENITOURINARY: no polyuria, no dysuria NEUROLOGICAL: No headache, dizziness, syncope, paralysis, ataxia, numbness or tingling in the extremities. No change in bowel or bladder control.  MUSCULOSKELETAL: No muscle, back pain, joint pain or stiffness.  HEMATOLOGIC: No anemia, bleeding or bruising.  LYMPHATICS: No enlarged nodes. No history of splenectomy.  PSYCHIATRIC: No history of depression or anxiety.      Physical Examination Blood pressure 108/51, pulse 64, temperature 100.5 F (38.1 C), temperature source Rectal, resp. rate 20, SpO2 95 %. No intake or output data in the 24 hours ending 12/31/13 1900  HEENT: sclera clear  Cardiovascular: RRR, 2/6 systolic murmur at apex, no JVD  Respiratory: coarse bilaterally  GI: abdomen soft, NT, ND  MSK: no LE edema  Neuro: no focal deficits  Psych: appropriate affect   Lab Results  Basic Metabolic Panel:  Recent Labs Lab 12/31/13 1445  NA 138  K 3.3*  CL 105  CO2 24  GLUCOSE 136*  BUN 19  CREATININE 1.09  CALCIUM 7.4*    Liver Function Tests:  Recent Labs Lab 12/31/13 1445  AST 37  ALT 20  ALKPHOS 80  BILITOT 0.4  PROT  4.9*  ALBUMIN 2.1*    CBC:  Recent Labs Lab 12/31/13 1445  WBC 14.5*  NEUTROABS 11.1*  HGB 9.5*  HCT 30.2*  MCV 91.8  PLT 235    Cardiac Enzymes:  Recent Labs Lab 12/31/13 1616  TROPONINI 5.89*    BNP: Invalid input(s): POCBNP     Impression/Recommendations  1. Elevated troponin - recent admit late Nov to Dec with severe legionella pneumonia, at that time elevated trop to 2 thought to be demand ischemia - represents today with fever, cough, elevated WBC, and new  infiltrate on CXR consistent with HCAP. Trop elevated to 4 with new TWIs on EKG. He is hemodynamically stable, no evidence of heart failure.  - describes non-cardiac chest pain. Constant without relief for several days, worst with position and cough.  - suspect main component of trop elevation is increased demand due to recurrent pneumonia, he may have some underlying chronic obstructive CAD playing a role. ACS/ acute occlusive CAD less likely - pneumonia management per primary team. He has ASA allergy, will start plavix, high dose statin, and anticoagulation at this time in case of true ACS. Would repeat limited echo to see if any new WMAs. Continue to cycle EKG and cardiac enzymes. Would anticipate ischemic evaluation this admit once pneumonia improves, either invasive or non-invasive. Will order hepain and plavix now, admit orders not in at this time, please order the above once admitted.     Carlyle Dolly, M.D.

## 2013-12-31 NOTE — ED Notes (Signed)
Pt being transported to x-ray

## 2013-12-31 NOTE — Progress Notes (Addendum)
ANTIBIOTIC CONSULT NOTE - INITIAL  Pharmacy Consult for Cefepime and Vancomycin Indication: pneumonia  Allergies  Allergen Reactions  . Codeine Nausea And Vomiting  . Indocin [Indomethacin] Nausea Only    dizzy  . Penicillins Hives  . Asa [Aspirin] Itching and Rash    Patient Measurements:   Actual Body Weight: 62.5 kg  Vital Signs: Temp: 100.5 F (38.1 C) (12/22 1500) Temp Source: Rectal (12/22 1500) BP: 99/61 mmHg (12/22 1736) Pulse Rate: 66 (12/22 1736) Intake/Output from previous day:   Intake/Output from this shift:    Labs:  Recent Labs  12/31/13 1445  WBC 14.5*  HGB 9.5*  PLT 235  CREATININE 1.09   CrCl cannot be calculated (Unknown ideal weight.). No results for input(s): VANCOTROUGH, VANCOPEAK, VANCORANDOM, GENTTROUGH, GENTPEAK, GENTRANDOM, TOBRATROUGH, TOBRAPEAK, TOBRARND, AMIKACINPEAK, AMIKACINTROU, AMIKACIN in the last 72 hours.   Microbiology: Recent Results (from the past 720 hour(s))  MRSA PCR Screening     Status: None   Collection Time: 12/16/13  2:56 PM  Result Value Ref Range Status   MRSA by PCR NEGATIVE NEGATIVE Final    Comment:        The GeneXpert MRSA Assay (FDA approved for NASAL specimens only), is one component of a comprehensive MRSA colonization surveillance program. It is not intended to diagnose MRSA infection nor to guide or monitor treatment for MRSA infections.     Medical History: Past Medical History  Diagnosis Date  . High cholesterol   . Hypertension   . Panic attack     Medications:   Assessment: 73yo male presents in respiratory distress. Pharmacy is consulted to dose cefepime and vancomycin for possible HCAP. Pt is febrile to 100.9, WBC 14.5, sCr 1.09 with CrCl ~ 60 ml/min.  Spoke with patient about allergy to PCNs. It happened many years ago and he reports hives on his arms. I spoke about the risk of cross-sensitivity with cephalosporins and he was willing to try the cefepime.  Goal of Therapy:   Vancomycin trough level 15-20 mcg/ml  Plan:  Vancomycin 750mg  IV q12h Cefepime 1g IV q8h Measure antibiotic drug levels at steady state Follow up culture results, renal function, and clinical course  Andrey Cota. Diona Foley, PharmD Clinical Pharmacist Pager [phone] 12/31/2013,5:56 PM  ADDN: Pharmacy was re-consulted to dose aztreonam for HCAP in place of cefepime. Pt received 1 dose of cefepime at [place]. Pt tolerated cefepime well, but was changed anyway.   Plan: Aztreonam 2g IV q8h  Andrey Cota. Diona Foley, PharmD Clinical Pharmacist Pager [phone]

## 2013-12-31 NOTE — Consult Note (Signed)
PULMONARY / CRITICAL CARE MEDICINE   Name: Rick Church MRN: [id number] DOB: 10/21/1940    ADMISSION DATE:  12/31/2013 CONSULTATION DATE:  12/31/2013  REFERRING MD :  EDP  CHIEF COMPLAINT:  L chest pain  INITIAL PRESENTATION: 73 year old male with recent prolonged admission for legionella PNA which progressed to ARDS and eventually tracheostomy. Discharged to SNF then to home 12/21.  Presented 12/22 to [facility] ED with generalized weakness and left sided chest pain. Found to have new consolidation on CXR and troponin elevation. PCCM asked to consult.   STUDIES:  CXR 12/22 >>> new opacity R lung base  SIGNIFICANT EVENTS: 11/5-12/7 admission for PNA, ARDS 11/18 - trached 12/7 - discharged to SNF 12/21 - discharged home 12/22 - re-admitted  HISTORY OF PRESENT ILLNESS:  73 year old male with PMH as below, which includes HTN and recent admission to [facility] 11/2013. He was admitted for legionaires with legionella PNA. He required intubation and ICU admission, he eventually progressed to ARDS requiring prolonged ventilator support. He was trached 11/18. Discharged to SNF 12/7. Presents again to [facility] ED 12/22 c/o SOB, L sided chest pain for several days. Pain was described as worse with cough/deep breathing.  In ED was noted to have fever, leukocytosis, and new consolidation on CXR. Also had elevated troponin to 5.3 with T wave inversion on EKG. He was admitted to hospitalist team. PCCM asked to see in consultation.   PAST MEDICAL HISTORY :   has a past medical history of High cholesterol; Hypertension; and Panic attack.  has past surgical history that includes Foot neuroma surgery (Left, 09/29/2101); Back surgery; and Tracheostomy. Prior to Admission medications   Medication Sig Start Date End Date Taking? Authorizing Provider  atorvastatin (LIPITOR) 40 MG tablet Take 40 mg by mouth daily.   Yes Historical Provider, MD  budesonide (PULMICORT) 0.5 MG/2ML nebulizer solution Take 2 mLs (0.5 mg total) by  nebulization 2 (two) times daily. 12/16/13  Yes Grace Bushy Minor, NP  citalopram (CELEXA) 10 MG tablet Take 1 tablet (10 mg total) by mouth daily. 12/16/13  Yes Grace Bushy Minor, NP  clonazePAM (KLONOPIN) 2 MG tablet Take 1 tablet (2 mg total) by mouth 2 (two) times daily. 12/16/13  Yes Grace Bushy Minor, NP  cloNIDine (CATAPRES) 0.2 MG tablet Take 1 tablet (0.2 mg total) by mouth 3 (three) times daily. 12/16/13  Yes Grace Bushy Minor, NP  fentaNYL (DURAGESIC - DOSED MCG/HR) 50 MCG/HR Place 1 patch (50 mcg total) onto the skin every 3 (three) days. 12/16/13  Yes William S Minor, NP  ipratropium-albuterol (DUONEB) 0.5-2.5 (3) MG/3ML SOLN Take 3 mLs by nebulization 4 (four) times daily. 12/16/13  Yes Grace Bushy Minor, NP  pantoprazole (PROTONIX) 40 MG tablet Take 40 mg by mouth daily as needed (for acid reflux).  06/17/13  Yes Historical Provider, MD  pregabalin (LYRICA) 75 MG capsule Take 75 mg by mouth 2 (two) times daily.   Yes Historical Provider, MD   Allergies  Allergen Reactions  . Codeine Nausea And Vomiting  . Indocin [Indomethacin] Nausea Only    dizzy  . Penicillins Hives  . Asa [Aspirin] Itching and Rash    FAMILY HISTORY:  has no family status information on file.  SOCIAL HISTORY:  reports that he has been smoking Cigarettes.  He started smoking about 57 years ago. He has been smoking about 1.50 packs per day. He does not have any smokeless tobacco history on file. He reports that he does not drink  alcohol or use illicit drugs.  REVIEW OF SYSTEMS:   All negative; except for those that are bolded, which indicate positives.  Constitutional: weight loss, weight gain, night sweats, fevers, chills, fatigue, weakness.  HEENT: headaches, sore throat, sneezing, nasal congestion, post nasal drip, difficulty swallowing, tooth/dental problems, visual complaints, visual changes, ear aches. Neuro: difficulty with speech, weakness, numbness, ataxia. CV:  chest pain, orthopnea, PND, swelling in lower  extremities, dizziness, palpitations, syncope.  Resp: cough, hemoptysis, dyspnea, wheezing. GI  heartburn, indigestion, abdominal pain, nausea, vomiting, diarrhea, constipation, change in bowel habits, loss of appetite, hematemesis, melena, hematochezia.  GU: dysuria, change in color of urine, urgency or frequency, flank pain, hematuria. MSK: joint pain or swelling, decreased range of motion. Psych: change in mood or affect, depression, anxiety, suicidal ideations, homicidal ideations. Skin: rash, itching, bruising.   SUBJECTIVE: Chest pain improved, mild cough with minimal sputum production.  Still having chills.  VITAL SIGNS: Temp:  [100.5 F (38.1 C)-100.9 F (38.3 C)] 100.5 F (38.1 C) (12/22 1500) Pulse Rate:  [64-89] 73 (12/22 1930) Resp:  [8-25] 21 (12/22 1930) BP: (95-119)/(49-61) 119/55 mmHg (12/22 1930) SpO2:  [91 %-100 %] 99 % (12/22 1930) HEMODYNAMICS:   VENTILATOR SETTINGS:   INTAKE / OUTPUT: No intake or output data in the 24 hours ending 12/31/13 1950  PHYSICAL EXAMINATION: General: Elderly male, resting in bed, in NAD. Neuro: A&O x 3, non-focal.  HEENT: Theodore/AT. PERRL, sclerae anicteric.  Old trach scar noted. Cardiovascular: RRR, no M/R/G.  Lungs: Respirations even and unlabored.  CTA bilaterally, diminished in bases. Abdomen: BS x 4, soft, NT/ND.  Musculoskeletal: No gross deformities, no edema.  Skin: Intact, warm, no rashes.   LABS:  CBC  Recent Labs Lab 12/31/13 1445  WBC 14.5*  HGB 9.5*  HCT 30.2*  PLT 235   Coag's  Recent Labs Lab 12/31/13 1803  APTT 36   BMET  Recent Labs Lab 12/31/13 1445  NA 138  K 3.3*  CL 105  CO2 24  BUN 19  CREATININE 1.09  GLUCOSE 136*   Electrolytes  Recent Labs Lab 12/31/13 1445  CALCIUM 7.4*   Sepsis Markers  Recent Labs Lab 12/31/13 1455 12/31/13 1905  LATICACIDVEN 1.89 2.25*   ABG  Recent Labs Lab 12/31/13 1555  PHART 7.407  PCO2ART 38.2  PO2ART 71.0*   Liver  Enzymes  Recent Labs Lab 12/31/13 1445  AST 37  ALT 20  ALKPHOS 80  BILITOT 0.4  ALBUMIN 2.1*   Cardiac Enzymes  Recent Labs Lab 12/31/13 1616  TROPONINI 5.89*   Glucose No results for input(s): GLUCAP in the last 168 hours.  Imaging No results found.   ASSESSMENT / PLAN:  Acute hypoxic respiratory failure Severe sepsis HCAP Recs: Continue supplemental O2 as needed to maintain SpO2 > 90%. Repeat lactate 1.5. Continue empiric abx. Follow cultures. If no improvement, consider adding levaquin or azithromycin for legionella coverage. Continue outpatient DuoNebs / Budesonide. Pulmonary hygiene.  Rest per primary.  Montey Hora, [place] Pgr: [phone]  or [phone] 12/31/2013, 8:58 PM  Patient seen and examined, diffuse crackles noted on exam this AM.  Saturation seems to be ok.  Lactic acid is holding.  Abx recommendations as above.  Recommend involvement of palliative care in case deteriorates.  Do not anticipate significant improvement.  PCCM will sign off, please call back if needed.  Patient seen and examined, agree with above note.  I dictated  the care and orders written for this patient under my direction.  Rush Farmer, MD [phone]

## 2013-12-31 NOTE — ED Notes (Signed)
NOTIFIED DR. POLLINA IN PERSON FOR PATIENTS PANIC LAB RESULTS OF I-STAT TROPONIN = 4.09 ng/ml ,@16 :10 PM ,12/31/2013.

## 2013-12-31 NOTE — H&P (Addendum)
Hospitalist Admission History and Physical  Patient name: Rick Church Medical record number: [id number] Date of birth: 1940/07/30 Age: 73 y.o. Gender: male  Primary Care Provider: Antony Blackbird, MD  Chief Complaint: sepsis, HCAP, NSTEMI   History of Present Illness:This is a 73 y.o. year old male with significant past medical history of COPD, ARDS, legionella PNA, HTN presenting with sepsis, HCAP, NSTEMI. Pt noted to have been recently admitted 11/5-->12/7 for acute resp failure 2/2 CAP w/ + legionella Ag as well as ARDS s/p trach. Was txd to SNF. Per pt, was sent home yesterday per pt. Since coming home, has had generalized malaise and weakness. Sxs progressed over course of day today. Mild productive cough. No wheezing. No smoking since hospital discharge earlier in the month. Called EMS. Was noted to be SOB on eval w/ T 103.2. Received tylenol.  Presented to ER Tmax 100.9, heart rate in the 60s to 80s, respirations in the tens to 20s. Blood pressure initially in the 90s, now in the 110s. We'll satting in the low 90s on room air. Now satting greater than 95% on 3 L. White blood cell count 14.5, hemoglobin 9.5, potassium 3.3, creatinine 1.09. Troponin 5.89. Lactate 2.25. UA not indicative of infection. EKG with T-wave inversions in the lateral leads. Chest x-ray shows no opacity in the right lung base.  Assessment and Plan: Rick Church is a 73 y.o. year old male presenting with sepsis, HCAP, NSTEMI   Active Problems:   Demand ischemia   NSTEMI (non-ST elevated myocardial infarction)   Sepsis   HCAP (healthcare-associated pneumonia)   1- Sepsis/HCAP -Sepsis likely secondary to healthcare associated pneumonia -Vancomycin and aztreonam per sepsis protocol -Trend lactate -Pan culture -Urine strep and Legionella -Fairly complicated course from last admission -Current respiratory status fairly stable -Some oxygen requirement with 3 L currently -No respiratory distress, increased work  of breathing. -Continue albuterol -No active wheezing-defer steroids for now -Formal critical care consult pending given recent hospitalization course -IV fluids -Stepdown bed -Follow critical care recommendations  2-NSTEMI -Noted troponin of 5.89 with noted T-wave inversions in lateral leads -Denies any prior history of cardiac disease in the past -Likely secondary to demand ischemia in the setting of above versus true ACS -Formal cardiology consult pending -Heparin drip per pharmacy in the interim -Defer nitroglycerin 2 cards as to avoid secondary hypotension  3-hypertension -Lower limit of normal blood pressures on admission -Hold home blood pressure regimen  FEN/GI: Nothing by mouth Prophylaxis: Heparin drip Disposition: Pending further evaluation Code Status: DO NOT RESUSCITATE   Patient Active Problem List   Diagnosis Date Noted  . Demand ischemia 12/31/2013  . NSTEMI (non-ST elevated myocardial infarction) 12/31/2013  . SOB (shortness of breath)   . Protein-calorie malnutrition, moderate 12/05/2013  . Hypernatremia 12/05/2013  . 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 ARDS (adult respiratory distress syndrome) 11/17/2013  . Legionella pneumonia 11/17/2013  . 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 Elevated troponin 11/15/2013  . Hypoxia 11/14/2013  . Severe sepsis 11/14/2013  . Acute respiratory failure 11/14/2013  . Pneumonia 11/14/2013   Past Medical History: Past Medical History  Diagnosis Date  . High cholesterol   . Hypertension   . Panic attack     Past Surgical History: Past Surgical History  Procedure Laterality Date  . Foot neuroma surgery Left 09/29/2101  . Back surgery    . Tracheostomy      feinstein    Social History: History   Social History  . Marital Status: Married  Spouse Name: N/A    Number of Children: N/A  . Years of Education: N/A   Social History Main Topics  . Smoking status: Current Every Day Smoker -- 1.50 packs/day     Types: Cigarettes    Start date: 11/14/1956  . Smokeless tobacco: None  . Alcohol Use: No  . Drug Use: No  . Sexual Activity: None   Other Topics Concern  . None   Social History Narrative    Family History: History reviewed. No pertinent family history.  Allergies: Allergies  Allergen Reactions  . Codeine Nausea And Vomiting  . Indocin [Indomethacin] Nausea Only    dizzy  . Penicillins Hives  . Asa [Aspirin] Itching and Rash    Current Facility-Administered Medications  Medication Dose Route Frequency Provider Last Rate Last Dose  . 0.9 % NaCl with KCl 20 mEq/ L  infusion   Intravenous Continuous Shanda Howells, MD      . aztreonam (AZACTAM) 2 g in dextrose 5 % 50 mL IVPB  2 g Intravenous Once Shanda Howells, MD      . clopidogrel (PLAVIX) tablet 75 mg  75 mg Oral Daily Arnoldo Lenis, MD      . heparin ADULT infusion 100 units/mL (25000 units/250 mL)  850 Units/hr Intravenous Continuous Rebecka Apley, [place] Day      . heparin bolus via infusion 3,500 Units  3,500 Units Intravenous Once Rebecka Apley, RPH      . sodium chloride 0.9 % bolus 1,000 mL  1,000 mL Intravenous Q1H Carrie Mew, PA-C 1,000 mL/hr at 12/31/13 1826 1,000 mL at 12/31/13 1826  . vancomycin (VANCOCIN) IVPB 1000 mg/200 mL premix  1,000 mg Intravenous Once Shanda Howells, MD      . vancomycin (VANCOCIN) IVPB 750 mg/150 ml premix  750 mg Intravenous Q12H Rebecka Apley, RPH   750 mg at 12/31/13 1840   Current Outpatient Prescriptions  Medication Sig Dispense Refill  . atorvastatin (LIPITOR) 40 MG tablet Take 40 mg by mouth daily.    . budesonide (PULMICORT) 0.5 MG/2ML nebulizer solution Take 2 mLs (0.5 mg total) by nebulization 2 (two) times daily.  12  . citalopram (CELEXA) 10 MG tablet Take 1 tablet (10 mg total) by mouth daily.    . clonazePAM (KLONOPIN) 2 MG tablet Take 1 tablet (2 mg total) by mouth 2 (two) times daily. 30 tablet 0  . cloNIDine (CATAPRES) 0.2 MG tablet Take 1 tablet (0.2 mg total) by mouth 3  (three) times daily.    . fentaNYL (DURAGESIC - DOSED MCG/HR) 50 MCG/HR Place 1 patch (50 mcg total) onto the skin every 3 (three) days. 5 patch 0  . ipratropium-albuterol (DUONEB) 0.5-2.5 (3) MG/3ML SOLN Take 3 mLs by nebulization 4 (four) times daily. 360 mL   . pantoprazole (PROTONIX) 40 MG tablet Take 40 mg by mouth daily as needed (for acid reflux).     . pregabalin (LYRICA) 75 MG capsule Take 75 mg by mouth 2 (two) times daily.     Review Of Systems: 12 point ROS negative except as noted above in HPI.  Physical Exam: Filed Vitals:   12/31/13 1930  BP: 119/55  Pulse: 73  Temp:   Resp: 21    General: cooperative and Underweight HEENT: PERRLA and extra ocular movement intact Heart: S1, S2 normal, no murmur, rub or gallop, regular rate and rhythm Lungs: Decreased breath sounds diffusely Abdomen: abdomen is soft without significant tenderness, masses, organomegaly or guarding Extremities: extremities normal, atraumatic,  no cyanosis or edema Skin:no rashes Neurology: normal without focal findings  Labs and Imaging: Lab Results  Component Value Date/Time   NA 138 12/31/2013 02:45 PM   K 3.3* 12/31/2013 02:45 PM   CL 105 12/31/2013 02:45 PM   CO2 24 12/31/2013 02:45 PM   BUN 19 12/31/2013 02:45 PM   CREATININE 1.09 12/31/2013 02:45 PM   GLUCOSE 136* 12/31/2013 02:45 PM   Lab Results  Component Value Date   WBC 14.5* 12/31/2013   HGB 9.5* 12/31/2013   HCT 30.2* 12/31/2013   MCV 91.8 12/31/2013   PLT 235 12/31/2013   Urinalysis    Component Value Date/Time   COLORURINE AMBER* 12/31/2013 1633   APPEARANCEUR HAZY* 12/31/2013 1633   LABSPEC 1.023 12/31/2013 1633   PHURINE 5.0 12/31/2013 1633   GLUCOSEU NEGATIVE 12/31/2013 1633   HGBUR NEGATIVE 12/31/2013 1633   BILIRUBINUR SMALL* 12/31/2013 1633   KETONESUR NEGATIVE 12/31/2013 1633   PROTEINUR NEGATIVE 12/31/2013 1633   UROBILINOGEN 1.0 12/31/2013 1633   NITRITE NEGATIVE 12/31/2013 1633   LEUKOCYTESUR NEGATIVE  12/31/2013 1633       Dg Chest 2 View  12/31/2013   CLINICAL DATA:  Shortness of breath.  EXAM: CHEST  2 VIEW  COMPARISON:  12/05/2013  FINDINGS: There is new patchy consolidation in the right lower lobe. Continued improvement in left perihilar opacity from prior. There is a diminutive right pleural effusion. The cardiomediastinal contours are unchanged. There is no pneumothorax. Stable osseous structures.  IMPRESSION: New patchy opacity right lung base. These may reflect pneumonia, consider aspiration given distribution. Continued improvement in left perihilar opacity.   Electronically Signed   By: Jeb Levering M.D.   On: 12/31/2013 15:48           Shanda Howells MD  Pager: [phone]

## 2013-12-31 NOTE — ED Provider Notes (Signed)
CSN: [id number]     Arrival date & time 12/31/13  1404 History   First MD Initiated Contact with Patient 12/31/13 1406     Chief Complaint  Patient presents with  . Shortness of Breath     (Consider location/radiation/quality/duration/timing/severity/associated sxs/prior Treatment) HPI Rick Church is a 73 -year-old male with past medical history of high cholesterol, hypertension, anxiety who presents to the ER with respiratory distress. Patient was recently discharged from [facility] for admission for pneumonia and legionnaires. Patient reports feeling his symptoms were better after being discharged, and progressively over the past week has had worsening of his symptoms. Patient states he has had a progressively worsening shortness of breath, and a left sided lower chest pain which is reproducible with breathing and coughing only. She reports having a mildly productive cough for the past week as well. Patient denies any associated fever until today. Patient was brought in by EMS who reported patient having a oral temperature of 103.38F, oxygen saturation was 80% on room air, and patient was in moderate respiratory distress. EMS reports after placement of nonrebreather at 15 L/m patient's oxygen saturation went up to 99% and stayed. Patient denies associated weakness, dizziness, lightheadedness, syncope, nausea, vomiting, abdominal pain, dysuria.  Past Medical History  Diagnosis Date  . High cholesterol   . Hypertension   . Panic attack    Past Surgical History  Procedure Laterality Date  . Foot neuroma surgery Left 09/29/2101  . Back surgery    . Tracheostomy      feinstein   History reviewed. No pertinent family history. History  Substance Use Topics  . Smoking status: Current Every Day Smoker -- 1.50 packs/day    Types: Cigarettes    Start date: 11/14/1956  . Smokeless tobacco: Not on file  . Alcohol Use: No    Review of Systems  Constitutional: Negative for fever.  HENT:  Negative for congestion, sore throat, trouble swallowing and voice change.   Eyes: Negative for visual disturbance.  Respiratory: Positive for cough and shortness of breath.   Cardiovascular: Negative for chest pain.  Gastrointestinal: Negative for nausea, vomiting and abdominal pain.  Genitourinary: Negative for dysuria.  Musculoskeletal: Negative for neck pain.       Chest discomfort  Skin: Negative for rash.  Neurological: Negative for dizziness, weakness and numbness.  Psychiatric/Behavioral: Negative.       Allergies  Codeine; Indocin; Penicillins; and Asa  Home Medications   Prior to Admission medications   Medication Sig Start Date End Date Taking? Authorizing Provider  atorvastatin (LIPITOR) 40 MG tablet Take 40 mg by mouth daily.   Yes Historical Provider, MD  budesonide (PULMICORT) 0.5 MG/2ML nebulizer solution Take 2 mLs (0.5 mg total) by nebulization 2 (two) times daily. 12/16/13  Yes Grace Bushy Minor, NP  citalopram (CELEXA) 10 MG tablet Take 1 tablet (10 mg total) by mouth daily. 12/16/13  Yes Grace Bushy Minor, NP  clonazePAM (KLONOPIN) 2 MG tablet Take 1 tablet (2 mg total) by mouth 2 (two) times daily. 12/16/13  Yes Grace Bushy Minor, NP  cloNIDine (CATAPRES) 0.2 MG tablet Take 1 tablet (0.2 mg total) by mouth 3 (three) times daily. 12/16/13  Yes Grace Bushy Minor, NP  fentaNYL (DURAGESIC - DOSED MCG/HR) 50 MCG/HR Place 1 patch (50 mcg total) onto the skin every 3 (three) days. 12/16/13  Yes William S Minor, NP  ipratropium-albuterol (DUONEB) 0.5-2.5 (3) MG/3ML SOLN Take 3 mLs by nebulization 4 (four) times daily. 12/16/13  Yes Grace Bushy  Minor, NP  pantoprazole (PROTONIX) 40 MG tablet Take 40 mg by mouth daily as needed (for acid reflux).  06/17/13  Yes Historical Provider, MD  pregabalin (LYRICA) 75 MG capsule Take 75 mg by mouth 2 (two) times daily.   Yes Historical Provider, MD   BP 123/54 mmHg  Pulse 77  Temp(Src) 99.4 F (37.4 C) (Oral)  Resp 21  Ht 5\' 7"  (1.702 m)  Wt  153 lb 11.2 oz (69.718 kg)  BMI 24.07 kg/m2  SpO2 97% Physical Exam  Constitutional: He is oriented to person, place, and time. He appears well-developed and well-nourished. He appears distressed.  Mild amount of respiratory distress. Patient speaking in full, clear sentences.  HENT:  Head: Normocephalic and atraumatic.  Mouth/Throat: Oropharynx is clear and moist. No oropharyngeal exudate.  Eyes: Right eye exhibits no discharge. Left eye exhibits no discharge. No scleral icterus.  Neck: Normal range of motion.  Cardiovascular: Normal rate, regular rhythm, S1 normal, S2 normal and normal heart sounds.   No murmur heard. Pulmonary/Chest: No accessory muscle usage. Tachypnea noted. He is in respiratory distress. He has decreased breath sounds in the right upper field, the left upper field and the left lower field. He has rales in the left lower field.  Abdominal: Soft. Normal appearance and bowel sounds are normal. There is no tenderness.  Musculoskeletal: Normal range of motion. He exhibits no edema or tenderness.  Neurological: He is alert and oriented to person, place, and time. He has normal strength. No cranial nerve deficit or sensory deficit. Coordination normal. GCS eye subscore is 4. GCS verbal subscore is 5. GCS motor subscore is 6.  Patient fully alert answering questions appropriately in full, clear sentences. Radial nerves II through XII grossly intact. Motor strength 5 out of 5 in all major muscle groups of upper and lower extremities. Distal sensation intact.  Skin: Skin is warm and dry. No rash noted. He is not diaphoretic.  Psychiatric: He has a normal mood and affect.    ED Course  Procedures (including critical care time) Labs Review Labs Reviewed  CBC WITH DIFFERENTIAL - Abnormal; Notable for the following:    WBC 14.5 (*)    RBC 3.29 (*)    Hemoglobin 9.5 (*)    HCT 30.2 (*)    Neutro Abs 11.1 (*)    Monocytes Absolute 1.1 (*)    All other components within normal  limits  COMPREHENSIVE METABOLIC PANEL - Abnormal; Notable for the following:    Potassium 3.3 (*)    Glucose, Bld 136 (*)    Calcium 7.4 (*)    Total Protein 4.9 (*)    Albumin 2.1 (*)    GFR calc non Af Amer 66 (*)    GFR calc Af Amer 77 (*)    All other components within normal limits  URINALYSIS, ROUTINE W REFLEX MICROSCOPIC - Abnormal; Notable for the following:    Color, Urine AMBER (*)    APPearance HAZY (*)    Bilirubin Urine SMALL (*)    All other components within normal limits  TROPONIN I - Abnormal; Notable for the following:    Troponin I 5.89 (*)    All other components within normal limits  CBC - Abnormal; Notable for the following:    WBC 13.1 (*)    RBC 3.18 (*)    Hemoglobin 9.3 (*)    HCT 29.4 (*)    All other components within normal limits  COMPREHENSIVE METABOLIC PANEL - Abnormal; Notable for the  following:    Glucose, Bld 103 (*)    Calcium 7.1 (*)    Total Protein 4.7 (*)    Albumin 1.9 (*)    GFR calc non Af Amer 81 (*)    All other components within normal limits  PROTIME-INR - Abnormal; Notable for the following:    Prothrombin Time 17.8 (*)    All other components within normal limits  TROPONIN I - Abnormal; Notable for the following:    Troponin I 5.03 (*)    All other components within normal limits  I-STAT TROPOININ, ED - Abnormal; Notable for the following:    Troponin i, poc 4.09 (*)    All other components within normal limits  I-STAT ARTERIAL BLOOD GAS, ED - Abnormal; Notable for the following:    pO2, Arterial 71.0 (*)    All other components within normal limits  I-STAT CG4 LACTIC ACID, ED - Abnormal; Notable for the following:    Lactic Acid, Venous 2.25 (*)    All other components within normal limits  CULTURE, BLOOD (ROUTINE X 2)  CULTURE, BLOOD (ROUTINE X 2)  URINE CULTURE  CULTURE, EXPECTORATED SPUTUM-ASSESSMENT  APTT  LACTIC ACID, PLASMA  LACTIC ACID, PLASMA  CORTISOL  FIBRINOGEN  COMPREHENSIVE METABOLIC PANEL  CBC WITH  DIFFERENTIAL  TROPONIN I  TROPONIN I  HEPARIN LEVEL (UNFRACTIONATED)  CBC  STREP PNEUMONIAE URINARY ANTIGEN  LEGIONELLA ANTIGEN, URINE  I-STAT CG4 LACTIC ACID, ED  I-STAT CG4 LACTIC ACID, ED  TYPE AND SCREEN  ABO/RH    Imaging Review Dg Chest 2 View  12/31/2013   CLINICAL DATA:  Shortness of breath.  EXAM: CHEST  2 VIEW  COMPARISON:  12/05/2013  FINDINGS: There is new patchy consolidation in the right lower lobe. Continued improvement in left perihilar opacity from prior. There is a diminutive right pleural effusion. The cardiomediastinal contours are unchanged. There is no pneumothorax. Stable osseous structures.  IMPRESSION: New patchy opacity right lung base. These may reflect pneumonia, consider aspiration given distribution. Continued improvement in left perihilar opacity.   Electronically Signed   By: Jeb Levering M.D.   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  Mild respiratory distress noted, patient's O2 saturation corrected with oxygenation by nasal cannula in the ER. Will follow up with chest x-ray, ABG, sepsis workup. Patient meets SIRS criteria at this time.  Chest radiograph with impression: New patchy opacity right lung base. These may reflect pneumonia, consider aspiration given distribution. Continued improvement in left perihilar opacity.  ABG  remarkable for hypoxia with PO2 at 71 with PCO2 38, pH 7.407. Troponin elevated at 4.09. EKG remarkable for a new T-wave inversion. We will consult cardiology. Patient placed on vancomycin and cefepime for healthcare associated pneumonia.  Consulted cardiology, spoke with Dr. branch who recommends cardiology following patient, however believes that she is elevated  troponin may be due to demand ischemia secondary to his pneumonia. Please see Dr. Nelly Laurence note in regard to recommendations following admission.  Consulted  [facility] hospitalist who agreed to see patient however, requested we consult critical care.  Patient admitted for healthcare associated pneumonia and new oxygen requirement. The patient appears reasonably stabilized for admission considering the current resources, flow, and capabilities available in the ED at this time, and I doubt any other [facility] requiring further screening and/or treatment in the ED prior to admission.  BP 123/54 mmHg  Pulse 77  Temp(Src) 99.4 F (37.4 C) (Oral)  Resp 21  Ht 5\' 7"  (1.702 m)  Wt 153 lb 11.2 oz (69.718 kg)  BMI 24.07 kg/m2  SpO2 97%  Signed,  Dahlia Bailiff, PA-C 2:04 AM  Patient seen and discussed with Dr. Joseph Berkshire, MD.   Carrie Mew, PA-C 01/01/14 [place], MD 01/01/14 [phone]

## 2013-12-31 NOTE — ED Notes (Signed)
Per EMS, pt comes from home with c/o shortness of breath. Pt warm to touch and c/o chest pain. Pt A&OX4, NAD noted. Pt had a temp of 103.2, 1000mg  PO tylenol given en route. Pt also received 5mg  Albuterol txt. Pt was d/c here 1 week ago following PNA. VS: 108/60, P80, CBG 153. EKG unremarkable.

## 2014-01-01 DIAGNOSIS — I359 Nonrheumatic aortic valve disorder, unspecified: Secondary | ICD-10-CM

## 2014-01-01 DIAGNOSIS — I214 Non-ST elevation (NSTEMI) myocardial infarction: Secondary | ICD-10-CM

## 2014-01-01 LAB — URINE CULTURE
Colony Count: NO GROWTH
Culture: NO GROWTH

## 2014-01-01 LAB — COMPREHENSIVE METABOLIC PANEL
ALBUMIN: 1.8 g/dL — AB (ref 3.5–5.2)
ALK PHOS: 77 U/L (ref 39–117)
ALT: 20 U/L (ref 0–53)
ANION GAP: 5 (ref 5–15)
AST: 37 U/L (ref 0–37)
BUN: 17 mg/dL (ref 6–23)
CO2: 28 mmol/L (ref 19–32)
CREATININE: 0.89 mg/dL (ref 0.50–1.35)
Calcium: 7 mg/dL — ABNORMAL LOW (ref 8.4–10.5)
Chloride: 107 mEq/L (ref 96–112)
GFR calc Af Amer: >90 mL/min (ref >90)
GFR calc non Af Amer: 84 mL/min — ABNORMAL LOW (ref >90)
Glucose, Bld: 94 mg/dL (ref 70–99)
POTASSIUM: 3.8 mmol/L (ref 3.5–5.1)
Sodium: 140 mmol/L (ref 135–145)
Total Bilirubin: 0.7 mg/dL (ref 0.3–1.2)
Total Protein: 5 g/dL — ABNORMAL LOW (ref 6.0–8.3)

## 2014-01-01 LAB — CBC WITH DIFFERENTIAL/PLATELET
Basophils Absolute: 0 10*3/uL (ref 0.0–0.1)
Basophils Relative: 0 % (ref 0–1)
Eosinophils Absolute: 0 10*3/uL (ref 0.0–0.7)
Eosinophils Relative: 0 % (ref 0–5)
HCT: 29.9 % — ABNORMAL LOW (ref 39.0–52.0)
HEMOGLOBIN: 9.5 g/dL — AB (ref 13.0–17.0)
Lymphocytes Relative: 7 % — ABNORMAL LOW (ref 12–46)
Lymphs Abs: 1 10*3/uL (ref 0.7–4.0)
MCH: 29.2 pg (ref 26.0–34.0)
MCHC: 31.8 g/dL (ref 30.0–36.0)
MCV: 92 fL (ref 78.0–100.0)
MONO ABS: 0.5 10*3/uL (ref 0.1–1.0)
MONOS PCT: 3 % (ref 3–12)
NEUTROS ABS: 13.5 10*3/uL — AB (ref 1.7–7.7)
Neutrophils Relative %: 90 % — ABNORMAL HIGH (ref 43–77)
Platelets: 224 10*3/uL (ref 150–400)
RBC: 3.25 MIL/uL — AB (ref 4.22–5.81)
RDW: 14.7 % (ref 11.5–15.5)
WBC: 15 10*3/uL — AB (ref 4.0–10.5)

## 2014-01-01 LAB — TROPONIN I
Troponin I: 5.03 ng/mL (ref <0.031)
Troponin I: 4.98 ng/mL (ref <0.031)
Troponin I: 5.31 ng/mL (ref <0.031)

## 2014-01-01 LAB — HEPARIN LEVEL (UNFRACTIONATED): Heparin Unfractionated: <0.1 IU/mL — ABNORMAL LOW (ref 0.30–0.70)

## 2014-01-01 LAB — LACTIC ACID, PLASMA: LACTIC ACID, VENOUS: 1.5 mmol/L (ref 0.5–2.2)

## 2014-01-01 LAB — CORTISOL: CORTISOL PLASMA: 27 ug/dL

## 2014-01-01 MED ORDER — ATORVASTATIN CALCIUM 80 MG PO TABS
80.0000 mg | ORAL_TABLET | Freq: Every day | ORAL | Status: DC
  Administered 2014-01-01 – 2014-01-08 (×8): 80 mg via ORAL
  Filled 2014-01-01 (×8): qty 1

## 2014-01-01 MED ORDER — OXYCODONE HCL 5 MG PO TABS
5.0000 mg | ORAL_TABLET | ORAL | Status: DC | PRN
  Filled 2014-01-01: qty 1

## 2014-01-01 MED ORDER — MORPHINE SULFATE 2 MG/ML IJ SOLN: 1.0000 mg | INTRAMUSCULAR | Status: DC | PRN

## 2014-01-01 MED ORDER — ACETAMINOPHEN 325 MG PO TABS
650.0000 mg | ORAL_TABLET | Freq: Four times a day (QID) | ORAL | Status: DC | PRN
  Administered 2014-01-01: 650 mg via ORAL
  Filled 2014-01-01 (×3): qty 2

## 2014-01-01 MED ORDER — HEPARIN BOLUS VIA INFUSION
2000.0000 [IU] | Freq: Once | INTRAVENOUS | Status: AC
  Administered 2014-01-01: 2000 [IU] via INTRAVENOUS
  Filled 2014-01-01: qty 2000

## 2014-01-01 MED ORDER — CLONAZEPAM 0.5 MG PO TABS
0.5000 mg | ORAL_TABLET | Freq: Two times a day (BID) | ORAL | Status: DC
  Administered 2014-01-01 – 2014-01-08 (×14): 0.5 mg via ORAL
  Filled 2014-01-01 (×13): qty 1

## 2014-01-01 MED ORDER — HEPARIN BOLUS VIA INFUSION
2000.0000 [IU] | Freq: Once | INTRAVENOUS | Status: AC
Start: 2014-01-01 — End: 2014-01-01
  Administered 2014-01-01: 2000 [IU] via INTRAVENOUS
  Filled 2014-01-01: qty 2000

## 2014-01-01 MED ORDER — CITALOPRAM HYDROBROMIDE 10 MG PO TABS
10.0000 mg | ORAL_TABLET | Freq: Every day | ORAL | Status: DC
  Administered 2014-01-01 – 2014-01-08 (×8): 10 mg via ORAL
  Filled 2014-01-01 (×9): qty 1

## 2014-01-01 MED ORDER — PREGABALIN 25 MG PO CAPS
75.0000 mg | ORAL_CAPSULE | Freq: Two times a day (BID) | ORAL | Status: DC
  Administered 2014-01-01 – 2014-01-08 (×14): 75 mg via ORAL
  Filled 2014-01-01: qty 3
  Filled 2014-01-01: qty 1
  Filled 2014-01-01: qty 3
  Filled 2014-01-01 (×8): qty 1
  Filled 2014-01-01 (×3): qty 3

## 2014-01-01 MED ORDER — FENTANYL 50 MCG/HR TD PT72
50.0000 ug | MEDICATED_PATCH | TRANSDERMAL | Status: DC
  Administered 2014-01-01 – 2014-01-07 (×3): 50 ug via TRANSDERMAL
  Filled 2014-01-01 (×4): qty 1

## 2014-01-01 MED ORDER — PANTOPRAZOLE SODIUM 40 MG PO TBEC
40.0000 mg | DELAYED_RELEASE_TABLET | Freq: Every day | ORAL | Status: DC | PRN
  Administered 2014-01-03 – 2014-01-08 (×2): 40 mg via ORAL
  Filled 2014-01-01 (×2): qty 1

## 2014-01-01 MED ORDER — HEPARIN BOLUS VIA INFUSION
2100.0000 [IU] | Freq: Once | INTRAVENOUS | Status: AC
  Administered 2014-01-01: 2100 [IU] via INTRAVENOUS
  Filled 2014-01-01: qty 2100

## 2014-01-01 NOTE — Progress Notes (Signed)
Echocardiogram 2D Echocardiogram has been performed.  Rick Church 01/01/2014, 3:14 PM

## 2014-01-01 NOTE — Progress Notes (Signed)
[facility] TEAM 1 - Stepdown/ICU TEAM Progress Note  ANIAS BARTOL TXM:[id number] DOB: 24-Nov-1940 DOA: 12/31/2013 PCP: Antony Blackbird, MD  Admit HPI / Brief Narrative: 72 year old male with history of COPD, ARDS, legionella PNA, and HTN who presented with sepsis, HCAP, and NSTEMI. Pt noted to have been recently admitted 11/5-->12/7 for acute resp failure 2/2 CAP w/ + legionella Ag as well as ARDS s/p trach. Was D/Cd to SNF. Per pt, was sent home 12/21 from SNF. Since going home had generalized malaise and weakness. Sxs progressed.  Called EMS. Was noted to be SOB on eval w/ T 103.2.   Presented to ER.  Blood pressure initially in the 90s. Satting in the low 90s on room air. White blood cell count 14.5, hemoglobin 9.5, potassium 3.3, creatinine 1.09. Troponin 5.89. Lactate 2.25. UA not indicative of infection. EKG with T-wave inversions in the lateral leads. Chest x-ray noted new opacity in the right lung base.  HPI/Subjective: Pt states he is feeling better in general. No chest pain at present.  Much less sob.    Assessment/Plan:  Sepsis due to HCAP -Sepsis secondary to healthcare associated pneumonia - Vancomycin and aztreonam per sepsis protocol  NSTEMI -Noted troponin peak of 5.89 with T-wave inversions in lateral leads -Denies any prior history of cardiac disease -Likely secondary to demand ischemia - cont plavix and heparin (allergy to ASA) -Cardiology following - plan for eventual cath when resp status stable   Hypertension -Lower limit of normal blood pressures at present - cont to gently hydrate -Hold home blood pressure regimen  COPD -well compensated at present   Code Status: FULL Family Communication: no family present at time of exam Disposition Plan: SDU  Consultants: PCCM Cardiology   Procedures: TTE - pending   Antibiotics: Aztreonam 12/22 > Cefepime 12/22 Vanc 12/22 >  DVT prophylaxis: IV heparin   Objective: Blood pressure 111/51, pulse 73,  temperature 99.4 F (37.4 C), temperature source Oral, resp. rate 29, height 5\' 7"  (1.702 m), weight 73.256 kg (161 lb 8 oz), SpO2 88 %.  Intake/Output Summary (Last 24 hours) at 01/01/14 1503 Last data filed at 01/01/14 1400  Gross per 24 hour  Intake 2130.5 ml  Output    550 ml  Net 1580.5 ml   Exam: General: No acute respiratory distress at rest in bed  Lungs: R basilar crackles - no wheeze  Cardiovascular: Regular rate and rhythm - 2/6 holosystolic M Abdomen: Nontender, nondistended, soft, bowel sounds positive, no rebound, no ascites, no appreciable mass Extremities: No significant cyanosis, clubbing, or edema bilateral lower extremities  Data Reviewed: Basic Metabolic Panel:  Recent Labs Lab 12/31/13 1445 12/31/13 1959 01/01/14 0142  NA 138 139 140  K 3.3* 3.7 3.8  CL 105 106 107  CO2 24 26 28   GLUCOSE 136* 103* 94  BUN 19 18 17   CREATININE 1.09 0.98 0.89  CALCIUM 7.4* 7.1* 7.0*    Liver Function Tests:  Recent Labs Lab 12/31/13 1445 12/31/13 1959 01/01/14 0142  AST 37 34 37  ALT 20 19 20   ALKPHOS 80 74 77  BILITOT 0.4 0.5 0.7  PROT 4.9* 4.7* 5.0*  ALBUMIN 2.1* 1.9* 1.8*   Coags:  Recent Labs Lab 12/31/13 1959  INR 1.45    Recent Labs Lab 12/31/13 1803  APTT 36    CBC:  Recent Labs Lab 12/31/13 1445 12/31/13 1959 01/01/14 0142  WBC 14.5* 13.1* 15.0*  NEUTROABS 11.1*  --  13.5*  HGB 9.5* 9.3* 9.5*  HCT 30.2* 29.4* 29.9*  MCV 91.8 92.5 92.0  PLT 235 198 224    Cardiac Enzymes:  Recent Labs Lab 12/31/13 1616 12/31/13 1959 01/01/14 0142 01/01/14 0754  TROPONINI 5.89* 5.03* 4.98* 5.31*   BNP (last 3 results)  Recent Labs  11/17/13 0400 11/24/13 0245  PROBNP 1388.0* 534.8*   Studies:  Recent x-ray studies have been reviewed in detail by the Attending Physician  Scheduled Meds:  Scheduled Meds: . atorvastatin  80 mg Oral q1800  . aztreonam  2 g Intravenous Q8H  . budesonide  0.25 mg Nebulization Q6H  .  clopidogrel  75 mg Oral Daily  . ipratropium-albuterol  3 mL Nebulization Q6H  . vancomycin  750 mg Intravenous Q12H    Time spent on care of this patient: 35 mins   Grayson White T , MD   [facility] Office  [phone] Pager - Text Page per Shea Evans as per below:  On-Call/Text Page:      [email or web address]      password [email or web address]  If 7PM-7AM, please contact night-coverage [email or web address] Password [email or web address] 01/01/2014, 3:03 PM   LOS: 1 day

## 2014-01-01 NOTE — Progress Notes (Signed)
  Reader for Heparin Indication: chest pain/ACS  Allergies  Allergen Reactions  . Codeine Nausea And Vomiting  . Indocin [Indomethacin] Nausea Only    dizzy  . Penicillins Hives    Tolerated a dose of cefepime 12/31/13  . Asa [Aspirin] Itching and Rash    Patient Measurements: Height: 5\' 7"  (170.2 cm) Weight: 161 lb 8 oz (73.256 kg) IBW/kg (Calculated) : 66.1   Vital Signs: Temp: 98.9 F (37.2 C) (12/23 1600) Temp Source: Oral (12/23 1600) BP: 106/53 mmHg (12/23 1600) Pulse Rate: 67 (12/23 1600)  Labs:  Recent Labs  12/31/13 1445  12/31/13 1803 12/31/13 1959 01/01/14 0142 01/01/14 0754 01/01/14 0908 01/01/14 1915  HGB 9.5*  --   --  9.3* 9.5*  --   --   --   HCT 30.2*  --   --  29.4* 29.9*  --   --   --   PLT 235  --   --  198 224  --   --   --   APTT  --   --  36  --   --   --   --   --   LABPROT  --   --   --  17.8*  --   --   --   --   INR  --   --   --  1.45  --   --   --   --   HEPARINUNFRC  --   --   --   --  <0.10*  --  <0.10* <0.10*  CREATININE 1.09  --   --  0.98 0.89  --   --   --   TROPONINI  --   < >  --  5.03* 4.98* 5.31*  --   --   < > = values in this interval not displayed.  Estimated Creatinine Clearance: 71.2 mL/min (by C-G formula based on Cr of 0.89).   Medications:  Infusions:  . 0.9 % NaCl with KCl 20 mEq / L 100 mL/hr at 12/31/13 2127  . heparin 1,350 Units/hr (01/01/14 1127)    Assessment: Rick Church presents in respiratory distress with several day history of constant aching left sided chest pain. Pharmacy consulted to dose heparin for ACS/chest pain.   Repeat heparin level still undetectable on 1350 units/hr. No IV or bleeding issues noted.  Goal of Therapy:  Heparin level 0.3-0.7 units/ml Monitor platelets by anticoagulation protocol: Yes   Plan:  1) Give heparin 2000 units bolus x 1 2) Increase heparin infusion to 1650 units/hr 3) Check anti-Xa level in 8 hours and daily while on  heparin 4) Continue to monitor H&H and platelets  5) Monitor s/sx of bleeding  Erin Hearing PharmD., BCPS Clinical Pharmacist Pager [phone] 01/01/2014 8:11 PM

## 2014-01-01 NOTE — Progress Notes (Signed)
  ANTICOAGULATION CONSULT NOTE - Follow Up Consult  Pharmacy Consult for Heparin Indication: chest pain/ACS  Allergies  Allergen Reactions  . Codeine Nausea And Vomiting  . Indocin [Indomethacin] Nausea Only    dizzy  . Penicillins Hives    Tolerated a dose of cefepime 12/31/13  . Asa [Aspirin] Itching and Rash    Patient Measurements: Height: 5\' 7"  (170.2 cm) Weight: 161 lb 8 oz (73.256 kg) IBW/kg (Calculated) : 66.1   Vital Signs: Temp: 100.1 F (37.8 C) (12/23 0743) Temp Source: Oral (12/23 0743) BP: 109/50 mmHg (12/23 0743) Pulse Rate: 75 (12/23 0743)  Labs:  Recent Labs  12/31/13 1445  12/31/13 1803 12/31/13 1959 01/01/14 0142 01/01/14 0754 01/01/14 0908  HGB 9.5*  --   --  9.3* 9.5*  --   --   HCT 30.2*  --   --  29.4* 29.9*  --   --   PLT 235  --   --  198 224  --   --   APTT  --   --  36  --   --   --   --   LABPROT  --   --   --  17.8*  --   --   --   INR  --   --   --  1.45  --   --   --   HEPARINUNFRC  --   --   --   --  <0.10*  --  <0.10*  CREATININE 1.09  --   --  0.98 0.89  --   --   TROPONINI  --   < >  --  5.03* 4.98* 5.31*  --   < > = values in this interval not displayed.  Estimated Creatinine Clearance: 71.2 mL/min (by C-G formula based on Cr of 0.89).   Medications:  Infusions:  . 0.9 % NaCl with KCl 20 mEq / L 100 mL/hr at 12/31/13 2127  . heparin 1,100 Units/hr (01/01/14 7416)    Assessment: 74 yoM presents in respiratory distress with several day history of constant aching left sided chest pain. Pharmacy consulted to dose heparin for ACS/chest pain.   Overnight 8-hour heparin level was subtherapeutic. Patient received bolus of 2000 units and rate was increased to 1100 units/hr. 6-hr HL checked this morning was still undetectable at < 0.10.  Platelets (224) and Hgb (9.5) remain stable. Troponins remain elevated at 5.31. No signs of bleeding noted by nursing and no interruptions in therapy noted.  Goal of Therapy:  Heparin level  0.3-0.7 units/ml Monitor platelets by anticoagulation protocol: Yes   Plan:  1) Give heparin 2100 units bolus x 1 2) Increase heparin infusion to 1350 units/hr 3) Check anti-Xa level in 8 hours and daily while on heparin 4) Continue to monitor H&H and platelets  5) Monitor s/sx of bleeding  Theron Arista, PharmD Clinical Pharmacist - Resident Pager: [phone] 12/23/201510:24 AM

## 2014-01-01 NOTE — Clinical Social Work Note (Signed)
Patient was at [facility] until 12/21- per [facility] liason patient had requested to go home so Dr evaluated him and allowed him to leave.  CSW signing off for now.  Domenica Reamer, [place] Social Worker [phone]

## 2014-01-01 NOTE — Progress Notes (Signed)
Patient Name: Rick Church Date of Encounter: 01/01/2014    Principal Problem:   HCAP (healthcare-associated pneumonia) Active Problems:   Acute respiratory failure   Demand ischemia   NSTEMI (non-ST elevated myocardial infarction)   Sepsis   COPD with emphysema    SUBJECTIVE  No further chest pain.  Breathing somewhat improved.  Trop 5.31 this AM.  CURRENT MEDS . aztreonam  2 g Intravenous Q8H  . budesonide  0.25 mg Nebulization Q6H  . clopidogrel  75 mg Oral Daily  . ipratropium-albuterol  3 mL Nebulization Q6H  . vancomycin  750 mg Intravenous Q12H    OBJECTIVE  Filed Vitals:   01/01/14 0600 01/01/14 0700 01/01/14 0743 01/01/14 0749  BP: 104/37 107/49 109/50   Pulse: 79 78 75   Temp:   100.1 F (37.8 C)   TempSrc:   Oral   Resp: 27 31 30    Height:      Weight:      SpO2: 93% 92% 91% 94%    Intake/Output Summary (Last 24 hours) at 01/01/14 1004 Last data filed at 01/01/14 0830  Gross per 24 hour  Intake 1203.5 ml  Output    550 ml  Net  653.5 ml   Filed Weights   12/31/13 2115 01/01/14 0409  Weight: 153 lb 11.2 oz (69.718 kg) 161 lb 8 oz (73.256 kg)    PHYSICAL EXAM  General: Pleasant, NAD. Neuro: Alert and oriented X 3. Moves all extremities spontaneously. Psych: Normal affect. HEENT:  Normal  Neck: Supple without JVD.  R bruit vs radiated murmur. Lungs:  Resp regular and unlabored, scatt rhonchi with markedly diminished breath sounds on right. Heart: RRR no s3, s4, 2/6 syst murmur @ base and 3/6 @ apex. Abdomen: Soft, non-tender, non-distended, BS + x 4.  Extremities: No clubbing, cyanosis.  Trace bilat ankle edema. DP/PT/Radials 2+ and equal bilaterally.  Accessory Clinical Findings  CBC  Recent Labs  12/31/13 1445 12/31/13 1959 01/01/14 0142  WBC 14.5* 13.1* 15.0*  NEUTROABS 11.1*  --  13.5*  HGB 9.5* 9.3* 9.5*  HCT 30.2* 29.4* 29.9*  MCV 91.8 92.5 92.0  PLT 235 198 867   Basic Metabolic Panel  Recent Labs  12/31/13 1959  01/01/14 0142  NA 139 140  K 3.7 3.8  CL 106 107  CO2 26 28  GLUCOSE 103* 94  BUN 18 17  CREATININE 0.98 0.89  CALCIUM 7.1* 7.0*   Liver Function Tests  Recent Labs  12/31/13 1959 01/01/14 0142  AST 34 37  ALT 19 20  ALKPHOS 74 77  BILITOT 0.5 0.7  PROT 4.7* 5.0*  ALBUMIN 1.9* 1.8*   Cardiac Enzymes  Recent Labs  12/31/13 1959 01/01/14 0142 01/01/14 0754  TROPONINI 5.03* 4.98* 5.31*   TELE  Rsr, rare pvc's.  ECG  Rsr, 65, left axis, ant infarct w/ inf/ant twi - new this admission.  Radiology/Studies  Dg Chest 2 View  12/31/2013   CLINICAL DATA:  Shortness of breath.  EXAM: CHEST  2 VIEW  COMPARISON:  12/05/2013  FINDINGS: There is new patchy consolidation in the right lower lobe. Continued improvement in left perihilar opacity from prior. There is a diminutive right pleural effusion. The cardiomediastinal contours are unchanged. There is no pneumothorax. Stable osseous structures.  IMPRESSION: New patchy opacity right lung base. These may reflect pneumonia, consider aspiration given distribution. Continued improvement in left perihilar opacity.   Electronically Signed   By: Jeb Levering M.D.   On: 12/31/2013  15:48    ASSESSMENT AND PLAN  1.  HCAP:  Abx per IM.  Persistent low grade fever with leukocytosis.  2.  NSTEMI:  In setting of #1.  Previous troponin elevation in setting of sepsis and legionella pna in Nov.  At that time trop peaked @ 2.16.  Echo showed nl LV fxn w/o wma's.  In setting of above, he reported persistent generalized malaise and aches along with constant chest pain that was worse with deep breathing/coughing.  Troponins on this admission have been elevated in the 4-5 range with a peak of 5.89, which happened to be the initial troponin.  Trend since has been relatively flat.  ECG on this admission notable for poor R progression and new inf and antl TWI.  Though c/p atypical and more consistent with pleuritic source/pna, objective findings  concerning for underlying CAD.  Cont plavix (allergic to asa) and heparin.  Add high potency statin.  No bb 2/2 relative hypotension.  Echo pending today to reassess for new wma's.  Once he recovers from #1, plan ischemic eval with low threshold to cath if any evidence of changes on repeat echo.  F/U ECG this AM.  Signed, Murray Hodgkins NP Patient seen and examined. I agree with the assessment and plan as detailed above. See also my additional thoughts below.   Will await the echo for the reassessment of LV function. I agree with plans as outlined above.  Dola Argyle, MD, [facility] 01/01/2014 11:49 AM

## 2014-01-01 NOTE — Progress Notes (Signed)
ANTICOAGULATION CONSULT NOTE - Follow Up Consult  Pharmacy Consult for heparin Indication: chest pain/ACS  Labs:  Recent Labs  12/31/13 1445 12/31/13 1616 12/31/13 1803 12/31/13 1959 01/01/14 0142  HGB 9.5*  --   --  9.3* 9.5*  HCT 30.2*  --   --  29.4* 29.9*  PLT 235  --   --  198 224  APTT  --   --  36  --   --   LABPROT  --   --   --  17.8*  --   INR  --   --   --  1.45  --   HEPARINUNFRC  --   --   --   --  <0.10*  CREATININE 1.09  --   --  0.98 0.89  TROPONINI  --  5.89*  --  5.03* 4.98*     Assessment: 73yo male undetectable on heparin with initial dosing for NSTEMI/demand ischemia.  Goal of Therapy:  Heparin level 0.3-0.7 units/ml   Plan:  Will rebolus with heparin 2000 units and increase gtt by 4 units/kg/hr to 1100 units/hr and check level in [place], PharmD, BCPS  01/01/2014,3:06 AM

## 2014-01-02 DIAGNOSIS — A419 Sepsis, unspecified organism: Principal | ICD-10-CM

## 2014-01-02 DIAGNOSIS — J9601 Acute respiratory failure with hypoxia: Secondary | ICD-10-CM

## 2014-01-02 DIAGNOSIS — J438 Other emphysema: Secondary | ICD-10-CM

## 2014-01-02 DIAGNOSIS — R0602 Shortness of breath: Secondary | ICD-10-CM

## 2014-01-02 LAB — COMPREHENSIVE METABOLIC PANEL
ALBUMIN: 1.6 g/dL — AB (ref 3.5–5.2)
ALK PHOS: 78 U/L (ref 39–117)
ALT: 23 U/L (ref 0–53)
AST: 27 U/L (ref 0–37)
Anion gap: 4 — ABNORMAL LOW (ref 5–15)
BUN: 19 mg/dL (ref 6–23)
CHLORIDE: 111 meq/L (ref 96–112)
CO2: 24 mmol/L (ref 19–32)
Calcium: 7 mg/dL — ABNORMAL LOW (ref 8.4–10.5)
Creatinine, Ser: 0.72 mg/dL (ref 0.50–1.35)
GFR calc Af Amer: >90 mL/min (ref >90)
GFR calc non Af Amer: >90 mL/min (ref >90)
GLUCOSE: 96 mg/dL (ref 70–99)
POTASSIUM: 3.1 mmol/L — AB (ref 3.5–5.1)
Sodium: 139 mmol/L (ref 135–145)
Total Bilirubin: 0.2 mg/dL — ABNORMAL LOW (ref 0.3–1.2)
Total Protein: 4.3 g/dL — ABNORMAL LOW (ref 6.0–8.3)

## 2014-01-02 LAB — CBC
HCT: 27.9 % — ABNORMAL LOW (ref 39.0–52.0)
HEMOGLOBIN: 8.9 g/dL — AB (ref 13.0–17.0)
MCH: 29.3 pg (ref 26.0–34.0)
MCHC: 31.9 g/dL (ref 30.0–36.0)
MCV: 91.8 fL (ref 78.0–100.0)
Platelets: 223 10*3/uL (ref 150–400)
RBC: 3.04 MIL/uL — ABNORMAL LOW (ref 4.22–5.81)
RDW: 15.1 % (ref 11.5–15.5)
WBC: 11.7 10*3/uL — ABNORMAL HIGH (ref 4.0–10.5)

## 2014-01-02 LAB — HEPARIN LEVEL (UNFRACTIONATED)
Heparin Unfractionated: <0.1 IU/mL — ABNORMAL LOW (ref 0.30–0.70)
Heparin Unfractionated: 0.29 IU/mL — ABNORMAL LOW (ref 0.30–0.70)
Heparin Unfractionated: 0.19 IU/mL — ABNORMAL LOW (ref 0.30–0.70)

## 2014-01-02 LAB — STREP PNEUMONIAE URINARY ANTIGEN: Strep Pneumo Urinary Antigen: NEGATIVE

## 2014-01-02 MED ORDER — HEPARIN (PORCINE) IN NACL 100-0.45 UNIT/ML-% IJ SOLN
2100.0000 [IU]/h | INTRAMUSCULAR | Status: DC
  Administered 2014-01-03: 2100 [IU]/h via INTRAVENOUS
  Filled 2014-01-02 (×3): qty 250

## 2014-01-02 MED ORDER — POTASSIUM CHLORIDE CRYS ER 20 MEQ PO TBCR
40.0000 meq | EXTENDED_RELEASE_TABLET | Freq: Once | ORAL | Status: AC
  Administered 2014-01-02: 40 meq via ORAL
  Filled 2014-01-02: qty 2

## 2014-01-02 MED ORDER — HEPARIN BOLUS VIA INFUSION
2000.0000 [IU] | Freq: Once | INTRAVENOUS | Status: AC
  Administered 2014-01-02: 2000 [IU] via INTRAVENOUS
  Filled 2014-01-02: qty 2000

## 2014-01-02 MED ORDER — IBUPROFEN 400 MG PO TABS
400.0000 mg | ORAL_TABLET | Freq: Four times a day (QID) | ORAL | Status: AC | PRN
  Administered 2014-01-02 – 2014-01-04 (×2): 400 mg via ORAL
  Filled 2014-01-02 (×4): qty 1

## 2014-01-02 MED ORDER — WHITE PETROLATUM GEL
Status: AC
Start: 2014-01-02 — End: 2014-01-02
  Administered 2014-01-02: 0.2
  Filled 2014-01-02: qty 5

## 2014-01-02 NOTE — Progress Notes (Signed)
PULMONARY / CRITICAL CARE MEDICINE   Name: RADEK CARNERO MRN: [id number] DOB: 03-10-1940    ADMISSION DATE:  12/31/2013 CONSULTATION DATE:  12/31/2013  REFERRING MD :  EDP  CHIEF COMPLAINT:  L chest pain  INITIAL PRESENTATION: 73 year old male with recent prolonged admission for legionella PNA which progressed to ARDS and eventually tracheostomy. Discharged to SNF then to home 12/21.  Presented 12/22 to [facility] ED with generalized weakness and left sided chest pain. Found to have new consolidation on CXR and troponin elevation. PCCM asked to consult.   STUDIES:  CXR 12/22 >>> new opacity R lung base  SIGNIFICANT EVENTS: 11/5-12/7 admission for PNA, ARDS 11/18 - trached 12/7 - discharged to SNF 12/21 - discharged home 12/22 - re-admitted  SUBJECTIVE: No complaints.   VITAL SIGNS: Temp:  [98.1 F (36.7 C)-99.4 F (37.4 C)] 98.2 F (36.8 C) (12/24 0800) Pulse Rate:  [51-73] 69 (12/24 1100) Resp:  [12-29] 23 (12/24 0900) BP: (98-139)/(46-70) 122/61 mmHg (12/24 1100) SpO2:  [88 %-100 %] 92 % (12/24 1100) Weight:  [73.3 kg (161 lb 9.6 oz)-73.5 kg (162 lb 0.6 oz)] 73.5 kg (162 lb 0.6 oz) (12/24 0500) HEMODYNAMICS:   VENTILATOR SETTINGS:   INTAKE / OUTPUT:  Intake/Output Summary (Last 24 hours) at 01/02/14 1135 Last data filed at 01/02/14 0700  Gross per 24 hour  Intake 2805.33 ml  Output    625 ml  Net 2180.33 ml    PHYSICAL EXAMINATION: General: Elderly male, resting in bed, in NAD. Neuro: A&O x 3, non-focal.  HEENT: [place]/AT. PERRL. Old trach scar noted. No JVD Cardiovascular: RRR, no M/R/G. Lungs: Respirations even and unlabored.  CTA bilaterally, diminished in bases. Abdomen: BS x 4, soft, NT/ND.  Musculoskeletal: No gross deformities, no edema.  Skin: Intact, warm, no rashes.  LABS:  CBC  Recent Labs Lab 12/31/13 1959 01/01/14 0142 01/02/14 0350  WBC 13.1* 15.0* 11.7*  HGB 9.3* 9.5* 8.9*  HCT 29.4* 29.9* 27.9*  PLT 198 224 223   Coag's  Recent  Labs Lab 12/31/13 1803 12/31/13 1959  APTT 36  --   INR  --  1.45   BMET  Recent Labs Lab 12/31/13 1959 01/01/14 0142 01/02/14 0350  NA 139 140 139  K 3.7 3.8 3.1*  CL 106 107 111  CO2 26 28 24   BUN 18 17 19   CREATININE 0.98 0.89 0.72  GLUCOSE 103* 94 96   Electrolytes  Recent Labs Lab 12/31/13 1959 01/01/14 0142 01/02/14 0350  CALCIUM 7.1* 7.0* 7.0*   Sepsis Markers  Recent Labs Lab 12/31/13 1905 12/31/13 1959 12/31/13 2255  LATICACIDVEN 2.25* 2.2 1.5   ABG  Recent Labs Lab 12/31/13 1555  PHART 7.407  PCO2ART 38.2  PO2ART 71.0*   Liver Enzymes  Recent Labs Lab 12/31/13 1959 01/01/14 0142 01/02/14 0350  AST 34 37 27  ALT 19 20 23   ALKPHOS 74 77 78  BILITOT 0.5 0.7 0.2*  ALBUMIN 1.9* 1.8* 1.6*   Cardiac Enzymes  Recent Labs Lab 12/31/13 1959 01/01/14 0142 01/01/14 0754  TROPONINI 5.03* 4.98* 5.31*   Glucose No results for input(s): GLUCAP in the last 168 hours.  Imaging No results found.   ASSESSMENT / PLAN:  Acute hypoxic respiratory failure Severe sepsis HCAP  Recs: Continue supplemental O2 as needed to maintain SpO2 > 90%. Continue empiric abx to complete 7 day course.  Follow cultures. Continue outpatient DuoNebs / Budesonide. Pulmonary hygiene  PCCM will sign off  Georgann Housekeeper, AGACNP-BC [place] Pulmonology/Critical  Care Pager [phone] or [phone]  Patient seen and examined, agree with above physical exam.  Extremely debilitated and will need prolonged rehab.  Treatment of infection as delineated above.  PCCM will sign off, please call back if needed.  Patient seen and examined, agree with above note.  I dictated the care and orders written for this patient under my direction.  Rush Farmer, MD [phone]

## 2014-01-02 NOTE — Progress Notes (Signed)
  [place] for Heparin Indication: chest pain/ACS  Allergies  Allergen Reactions  . Codeine Nausea And Vomiting  . Indocin [Indomethacin] Nausea Only    dizzy  . Penicillins Hives    Tolerated a dose of cefepime 12/31/13  . Asa [Aspirin] Itching and Rash    Patient Measurements: Height: 5\' 7"  (170.2 cm) Weight: 162 lb 0.6 oz (73.5 kg) IBW/kg (Calculated) : 66.1  Heparin dosing weight = TBW= 73.5 kg   Vital Signs: Temp: 98.4 F (36.9 C) (12/24 1925) Temp Source: Oral (12/24 1925) BP: 117/62 mmHg (12/24 2000) Pulse Rate: 66 (12/24 2000)  Labs:  Recent Labs  12/31/13 1803 12/31/13 1959 01/01/14 0142 01/01/14 0754  01/02/14 0350 01/02/14 1110 01/02/14 2037  HGB  --  9.3* 9.5*  --   --  8.9*  --   --   HCT  --  29.4* 29.9*  --   --  27.9*  --   --   PLT  --  198 224  --   --  223  --   --   APTT 36  --   --   --   --   --   --   --   LABPROT  --  17.8*  --   --   --   --   --   --   INR  --  1.45  --   --   --   --   --   --   HEPARINUNFRC  --   --  <0.10*  --   < > <0.10* 0.29* 0.19*  CREATININE  --  0.98 0.89  --   --  0.72  --   --   TROPONINI  --  5.03* 4.98* 5.31*  --   --   --   --   < > = values in this interval not displayed.  Estimated Creatinine Clearance: 79.2 mL/min (by C-G formula based on Cr of 0.72).   Medications:  Infusions:  . 0.9 % NaCl with KCl 20 mEq / L 75 mL/hr at 01/02/14 2055  . heparin 2,150 Units/hr (01/02/14 2000)    Assessment: 8 hour heparin level decreased to 0.19 despite an increase in the heparin rate to 2150 units/hr  in this 73 yo male on IV heparin drip for NSTEMI.  Discussed with RN who reports IV site looks good and no complications with the heparin infusion. No bleeding reported. Planning cardiac cath on Monday.   Goal of Therapy:  Heparin level 0.3-0.7 units/ml Monitor platelets by anticoagulation protocol: Yes   Plan: Give 2000 units IV heparin bolus x1 Increase heparin gtt  to 2350 units/hr  Check an 8 hour heparin level Daily heparin level and CBC  Nicole Cella, RPh Clinical Pharmacist Pager: [phone]  01/02/2014 9:13 PM

## 2014-01-02 NOTE — Progress Notes (Signed)
ANTICOAGULATION CONSULT NOTE - Follow Up Consult  Pharmacy Consult for heparin Indication: NSTEMI   Labs:  Recent Labs  12/31/13 1803 12/31/13 1959  01/01/14 0142 01/01/14 0754 01/01/14 0908 01/01/14 1915 01/02/14 0350  HGB  --  9.3*  --  9.5*  --   --   --  8.9*  HCT  --  29.4*  --  29.9*  --   --   --  27.9*  PLT  --  198  --  224  --   --   --  223  APTT 36  --   --   --   --   --   --   --   LABPROT  --  17.8*  --   --   --   --   --   --   INR  --  1.45  --   --   --   --   --   --   HEPARINUNFRC  --   --   < > <0.10*  --  <0.10* <0.10* <0.10*  CREATININE  --  0.98  --  0.89  --   --   --  0.72  TROPONINI  --  5.03*  --  4.98* 5.31*  --   --   --   < > = values in this interval not displayed.    Assessment: 73yo male remains undetectable on heparin after multiple rate increases.  Goal of Therapy:  Heparin level 0.3-0.7 units/ml   Plan:  Will rebolus with heparin 2000 units x1 and increase gtt by 4 units/kg/hr to 2000 units/hr and check level in [place], PharmD, BCPS  01/02/2014,5:58 AM

## 2014-01-02 NOTE — Progress Notes (Signed)
[facility] PROGRESS NOTE  DONTRAIL BLACKWELL SJG:[id number] DOB: 08/30/1940 DOA: 12/31/2013 PCP: Antony Blackbird, MD  Assessment/Plan: 73 year old male with recent history of ARDS, legionella PNA, and COPD, HTN who presented with sepsis, HCAP, and NSTEMI. Pt noted to have been recently admitted 11/5-->12/7 for acute resp failure 2/2 CAP w/ + legionella Ag as well as ARDS s/p trach. Was D/Cd to SNF. Per pt, was sent home 12/21 from SNF. Since going home had generalized malaise and weakness. Sxs progressed. Called EMS. Was noted to be SOB on eval w/ T 103.2.   Presented to ER. Blood pressure initially in the 90s. Satting in the low 90s on room air. White blood cell count 14.5, hemoglobin 9.5, potassium 3.3, creatinine 1.09. Troponin 5.89. Lactate 2.25. UA not indicative of infection. EKG with T-wave inversions in the lateral leads. Chest x-ray noted new opacity in the right lung base.  1. Sepsis/HCAP; CXR: New patchy opacity right lung base; blood cultures: NGTD  -cont atx, bronchodilators, oxygen/titarte down;   2. NSTEMI; on IV heparin, plavix, statin; not on BB (due to sever COPD) -echo: LVEF 55-60%, AS, AVA 1.3 -LHC on monday per cards; cont monitor   3. COPD, no wheezing on exam; cont bronchodilators   Code Status: full Family Communication: d/w patient (indicate person spoken with, relationship, and if by phone, the number) Disposition Plan: pend LHC   Consultants:  Cardiology   Procedures:  none  Antibiotics:  Aztreonam 12/*23>>>>  vanc 12/23>>>>   (indicate start date, and stop date if known)  HPI/Subjective: Alert, oriented   Objective: Filed Vitals:   01/02/14 0800  BP: 122/60  Pulse: 69  Temp: 98.2 F (36.8 C)  Resp: 14    Intake/Output Summary (Last 24 hours) at 01/02/14 1019 Last data filed at 01/02/14 0700  Gross per 24 hour  Intake 2918.83 ml  Output    625 ml  Net 2293.83 ml   Filed Weights   01/01/14 0409 01/02/14 0000 01/02/14 0500   Weight: 73.256 kg (161 lb 8 oz) 73.3 kg (161 lb 9.6 oz) 73.5 kg (162 lb 0.6 oz)    Exam:   General:  No distress   Cardiovascular: s1,s2 rrr  Respiratory: CTA BL  Abdomen: soft, nt,nd   Musculoskeletal: no LE edema    Data Reviewed: Basic Metabolic Panel:  Recent Labs Lab 12/31/13 1445 12/31/13 1959 01/01/14 0142 01/02/14 0350  NA 138 139 140 139  K 3.3* 3.7 3.8 3.1*  CL 105 106 107 111  CO2 24 26 28 24   GLUCOSE 136* 103* 94 96  BUN 19 18 17 19   CREATININE 1.09 0.98 0.89 0.72  CALCIUM 7.4* 7.1* 7.0* 7.0*   Liver Function Tests:  Recent Labs Lab 12/31/13 1445 12/31/13 1959 01/01/14 0142 01/02/14 0350  AST 37 34 37 27  ALT 20 19 20 23   ALKPHOS 80 74 77 78  BILITOT 0.4 0.5 0.7 0.2*  PROT 4.9* 4.7* 5.0* 4.3*  ALBUMIN 2.1* 1.9* 1.8* 1.6*   No results for input(s): LIPASE, AMYLASE in the last 168 hours. No results for input(s): AMMONIA in the last 168 hours. CBC:  Recent Labs Lab 12/31/13 1445 12/31/13 1959 01/01/14 0142 01/02/14 0350  WBC 14.5* 13.1* 15.0* 11.7*  NEUTROABS 11.1*  --  13.5*  --   HGB 9.5* 9.3* 9.5* 8.9*  HCT 30.2* 29.4* 29.9* 27.9*  MCV 91.8 92.5 92.0 91.8  PLT 235 198 224 223   Cardiac Enzymes:  Recent Labs Lab 12/31/13 1616 12/31/13  1959 01/01/14 0142 01/01/14 0754  TROPONINI 5.89* 5.03* 4.98* 5.31*   BNP (last 3 results)  Recent Labs  11/17/13 0400 11/24/13 0245  PROBNP 1388.0* 534.8*   CBG: No results for input(s): GLUCAP in the last 168 hours.  Recent Results (from the past 240 hour(s))  Blood Culture (routine x 2)     Status: None (Preliminary result)   Collection Time: 12/31/13  5:05 PM  Result Value Ref Range Status   Specimen Description BLOOD ARM RIGHT  Final   Special Requests BOTTLES DRAWN AEROBIC ONLY 5CC  Final   Culture  Setup Time   Final    01/01/2014 01:25 Performed at [facility]    Culture   Final           BLOOD CULTURE RECEIVED NO GROWTH TO DATE CULTURE WILL BE HELD FOR 5 DAYS  BEFORE ISSUING A FINAL NEGATIVE REPORT Performed at [facility]    Report Status PENDING  Incomplete  Urine culture     Status: None   Collection Time: 12/31/13  6:10 PM  Result Value Ref Range Status   Specimen Description URINE, RANDOM  Final   Special Requests NONE  Final   Culture  Setup Time   Final    01/01/2014 00:45 Performed at [place] Performed at [facility]   Final   Culture NO GROWTH Performed at [facility]   Final   Report Status 01/01/2014 FINAL  Final  Blood Culture (routine x 2)     Status: None (Preliminary result)   Collection Time: 12/31/13  6:31 PM  Result Value Ref Range Status   Specimen Description BLOOD ARM RIGHT  Final   Special Requests BOTTLES DRAWN AEROBIC AND ANAEROBIC [facility]  Final   Culture  Setup Time   Final    01/01/2014 01:25 Performed at [facility]    Culture   Final           BLOOD CULTURE RECEIVED NO GROWTH TO DATE CULTURE WILL BE HELD FOR 5 DAYS BEFORE ISSUING A FINAL NEGATIVE REPORT Performed at [facility]    Report Status PENDING  Incomplete     Studies: Dg Chest 2 View  12/31/2013   CLINICAL DATA:  Shortness of breath.  EXAM: CHEST  2 VIEW  COMPARISON:  12/05/2013  FINDINGS: There is new patchy consolidation in the right lower lobe. Continued improvement in left perihilar opacity from prior. There is a diminutive right pleural effusion. The cardiomediastinal contours are unchanged. There is no pneumothorax. Stable osseous structures.  IMPRESSION: New patchy opacity right lung base. These may reflect pneumonia, consider aspiration given distribution. Continued improvement in left perihilar opacity.   Electronically Signed   By: Jeb Levering M.D.   On: 12/31/2013 15:48    Scheduled Meds: . atorvastatin  80 mg Oral q1800  . aztreonam  2 g Intravenous Q8H  . budesonide  0.25 mg Nebulization Q6H  . citalopram  10 mg Oral Daily  . clonazePAM  0.5  mg Oral BID  . clopidogrel  75 mg Oral Daily  . fentaNYL  50 mcg Transdermal Q72H  . ipratropium-albuterol  3 mL Nebulization Q6H  . pregabalin  75 mg Oral BID  . vancomycin  750 mg Intravenous Q12H   Continuous Infusions: . 0.9 % NaCl with KCl 20 mEq / L 100 mL/hr at 01/02/14 0604  . heparin 2,000 Units/hr (01/02/14 0604)    Principal  Problem:   HCAP (healthcare-associated pneumonia) Active Problems:   Acute respiratory failure   COPD with emphysema   Demand ischemia   NSTEMI (non-ST elevated myocardial infarction)   Sepsis    Time spent: >35 minutes     Kinnie Feil  [facility] Pager [phone]. If 7PM-7AM, please contact night-coverage at [email or web address], password [facility] 01/02/2014, 10:19 AM  LOS: 2 days

## 2014-01-02 NOTE — Progress Notes (Signed)
    Subjective:  No chest pain.   Objective:  Vital Signs in the last 24 hours: Temp:  [98.1 F (36.7 C)-99.4 F (37.4 C)] 98.2 F (36.8 C) (12/24 0800) Pulse Rate:  [51-73] 69 (12/24 0800) Resp:  [12-29] 14 (12/24 0800) BP: (98-126)/(46-62) 122/60 mmHg (12/24 0800) SpO2:  [88 %-100 %] 97 % (12/24 0809) Weight:  [161 lb 9.6 oz (73.3 kg)-162 lb 0.6 oz (73.5 kg)] 162 lb 0.6 oz (73.5 kg) (12/24 0500)  Intake/Output from previous day:  Intake/Output Summary (Last 24 hours) at 01/02/14 0824 Last data filed at 01/02/14 0700  Gross per 24 hour  Intake 3190.83 ml  Output   1175 ml  Net 2015.83 ml    Physical Exam: General appearance: alert, cooperative, no distress and on O2 Lungs: few rhonchi Heart: regular rate and rhythm   Rate: 68  Rhythm: normal sinus rhythm  Lab Results:  Recent Labs  01/01/14 0142 01/02/14 0350  WBC 15.0* 11.7*  HGB 9.5* 8.9*  PLT 224 223    Recent Labs  01/01/14 0142 01/02/14 0350  NA 140 139  K 3.8 3.1*  CL 107 111  CO2 28 24  GLUCOSE 94 96  BUN 17 19  CREATININE 0.89 0.72    Recent Labs  01/01/14 0142 01/01/14 0754  TROPONINI 4.98* 5.31*    Recent Labs  12/31/13 1959  INR 1.45    Imaging: Imaging results have been reviewed  Cardiac Studies:  Assessment/Plan:  73 y.o.male history of HTN, HL, tobacco abuse,COPD, and recent admit earlier this month with respiratory failure due to severe legionella and ARDS. During that admission he had Troponin elevation of 2. Echo showed normal LVF then and it was felt he had demand ischemia. He was re admitted 12/31/13 with HCAP. His Troponin went to 5.89 and he had TWI in 3, F, V3-V4 which was new. Echo again shows normal LVF without WMA. After review with Dr Ron Parker, we feel he needs cath. I offered cath today but the pt feels he is not ready, still weak, still on O2. He is on the board for Monday.    Principal Problem:   HCAP (healthcare-associated pneumonia) Active Problems:  Acute respiratory failure   Demand ischemia   NSTEMI (non-ST elevated myocardial infarction)   Sepsis   COPD with emphysema   PLAN: Cath Monday. He has a reported allergy to ASA (rash), he is on Plavix and statin, no beta blocker secondary to low B/P on admission. Consider adding low dose Metoprolol at some point, hesitant to start now with pulmonary issues.   Kerin Ransom PA-C Beeper [phone] 01/02/2014, 8:24 AM  Patient seen and examined. I agree with the assessment and plan as detailed above. See also my additional thoughts below.   I have reviewed all of the data completely and discussed this with Mr. Rosalyn Gess. I agree it will be most appropriate to proceed with cardiac catheterization this admission. We talked about the possibility of proceeding today. The patient feels he is not strong enough and able to proceed today. It is appropriate to continue his overall care and plan for cardiac catheterization on Monday.  Dola Argyle, MD, [facility] 01/02/2014 9:07 AM

## 2014-01-02 NOTE — Progress Notes (Signed)
  [place] for Heparin Indication: chest pain/ACS  Allergies  Allergen Reactions  . Codeine Nausea And Vomiting  . Indocin [Indomethacin] Nausea Only    dizzy  . Penicillins Hives    Tolerated a dose of cefepime 12/31/13  . Asa [Aspirin] Itching and Rash    Patient Measurements: Height: 5\' 7"  (170.2 cm) Weight: 162 lb 0.6 oz (73.5 kg) IBW/kg (Calculated) : 66.1   Vital Signs: Temp: 98.2 F (36.8 C) (12/24 0800) Temp Source: Axillary (12/24 0800) BP: 123/67 mmHg (12/24 1203) Pulse Rate: 71 (12/24 1203)  Labs:  Recent Labs  12/31/13 1803 12/31/13 1959 01/01/14 0142 01/01/14 0754  01/01/14 1915 01/02/14 0350 01/02/14 1110  HGB  --  9.3* 9.5*  --   --   --  8.9*  --   HCT  --  29.4* 29.9*  --   --   --  27.9*  --   PLT  --  198 224  --   --   --  223  --   APTT 36  --   --   --   --   --   --   --   LABPROT  --  17.8*  --   --   --   --   --   --   INR  --  1.45  --   --   --   --   --   --   HEPARINUNFRC  --   --  <0.10*  --   < > <0.10* <0.10* 0.29*  CREATININE  --  0.98 0.89  --   --   --  0.72  --   TROPONINI  --  5.03* 4.98* 5.31*  --   --   --   --   < > = values in this interval not displayed.  Estimated Creatinine Clearance: 79.2 mL/min (by C-G formula based on Cr of 0.72).   Medications:  Infusions:  . 0.9 % NaCl with KCl 20 mEq / L 75 mL/hr at 01/02/14 1023  . heparin 2,000 Units/hr (01/02/14 0604)    Assessment: 86 yoM presents in respiratory distress with several day history of constant aching left sided chest pain. Patient continues on IV heparin for anticoagulation. Heparin level is slightly low at 0.29. H/H is down slightly but no bleeding noted. Platelets are WNL. Planning cardiac cath on Monday.   Goal of Therapy:  Heparin level 0.3-0.7 units/ml Monitor platelets by anticoagulation protocol: Yes   Plan: 1. Increase heparin gtt to 2150 units/hr (2 units/kg/hr increase)\ 2. Check an 8 hour heparin  level 3. Daily heparin level and CBC  Salome Arnt, PharmD, BCPS Pager # [phone] 01/02/2014 12:25 PM

## 2014-01-03 LAB — COMPREHENSIVE METABOLIC PANEL
ALK PHOS: 81 U/L (ref 39–117)
ALT: 39 U/L (ref 0–53)
ANION GAP: 6 (ref 5–15)
AST: 37 U/L (ref 0–37)
Albumin: 1.6 g/dL — ABNORMAL LOW (ref 3.5–5.2)
BUN: 12 mg/dL (ref 6–23)
CHLORIDE: 115 meq/L — AB (ref 96–112)
CO2: 21 mmol/L (ref 19–32)
CREATININE: 0.72 mg/dL (ref 0.50–1.35)
Calcium: 7.6 mg/dL — ABNORMAL LOW (ref 8.4–10.5)
GLUCOSE: 93 mg/dL (ref 70–99)
POTASSIUM: 4 mmol/L (ref 3.5–5.1)
Sodium: 142 mmol/L (ref 135–145)
Total Bilirubin: 0.1 mg/dL — ABNORMAL LOW (ref 0.3–1.2)
Total Protein: 4.6 g/dL — ABNORMAL LOW (ref 6.0–8.3)

## 2014-01-03 LAB — CBC
HCT: 27.8 % — ABNORMAL LOW (ref 39.0–52.0)
Hemoglobin: 8.8 g/dL — ABNORMAL LOW (ref 13.0–17.0)
MCH: 29.2 pg (ref 26.0–34.0)
MCHC: 31.7 g/dL (ref 30.0–36.0)
MCV: 92.4 fL (ref 78.0–100.0)
PLATELETS: 249 10*3/uL (ref 150–400)
RBC: 3.01 MIL/uL — ABNORMAL LOW (ref 4.22–5.81)
RDW: 15.3 % (ref 11.5–15.5)
WBC: 10 10*3/uL (ref 4.0–10.5)

## 2014-01-03 LAB — HEPARIN LEVEL (UNFRACTIONATED)
Heparin Unfractionated: 0.8 IU/mL — ABNORMAL HIGH (ref 0.30–0.70)
Heparin Unfractionated: 0.32 IU/mL (ref 0.30–0.70)

## 2014-01-03 MED ORDER — LEVOFLOXACIN IN D5W 750 MG/150ML IV SOLN
750.0000 mg | INTRAVENOUS | Status: DC
  Administered 2014-01-03 – 2014-01-04 (×2): 750 mg via INTRAVENOUS
  Filled 2014-01-03 (×3): qty 150

## 2014-01-03 MED ORDER — HEPARIN (PORCINE) IN NACL 100-0.45 UNIT/ML-% IJ SOLN
1900.0000 [IU]/h | INTRAMUSCULAR | Status: DC
  Administered 2014-01-03 – 2014-01-04 (×2): 1900 [IU]/h via INTRAVENOUS
  Filled 2014-01-03 (×3): qty 250

## 2014-01-03 NOTE — Progress Notes (Signed)
  [place] for Heparin Indication: chest pain/ACS  Allergies  Allergen Reactions  . Codeine Nausea And Vomiting  . Indocin [Indomethacin] Nausea Only    dizzy  . Penicillins Hives    Tolerated a dose of cefepime 12/31/13  . Asa [Aspirin] Itching and Rash    Patient Measurements: Height: 5\' 7"  (170.2 cm) Weight: 162 lb 0.6 oz (73.5 kg) IBW/kg (Calculated) : 66.1  Heparin dosing weight = TBW= 73.5 kg   Vital Signs: Temp: 98.1 F (36.7 C) (12/25 0350) Temp Source: Oral (12/25 0350) BP: 128/60 mmHg (12/25 0350) Pulse Rate: 74 (12/25 0350)  Labs:  Recent Labs  12/31/13 1803 12/31/13 1959 01/01/14 0142 01/01/14 0754  01/02/14 0350 01/02/14 1110 01/02/14 2037 01/03/14 0347  HGB  --  9.3* 9.5*  --   --  8.9*  --   --  8.8*  HCT  --  29.4* 29.9*  --   --  27.9*  --   --  27.8*  PLT  --  198 224  --   --  223  --   --  249  APTT 36  --   --   --   --   --   --   --   --   LABPROT  --  17.8*  --   --   --   --   --   --   --   INR  --  1.45  --   --   --   --   --   --   --   HEPARINUNFRC  --   --  <0.10*  --   < > <0.10* 0.29* 0.19* 0.80*  CREATININE  --  0.98 0.89  --   --  0.72  --   --   --   TROPONINI  --  5.03* 4.98* 5.31*  --   --   --   --   --   < > = values in this interval not displayed.  Estimated Creatinine Clearance: 79.2 mL/min (by C-G formula based on Cr of 0.72).  Assessment: 73 y.o. male with chest pain/elevated cardiac markers, for heparin  Goal of Therapy:  Heparin level 0.3-0.7 units/ml Monitor platelets by anticoagulation protocol: Yes   Plan: Decrease Heparin 2100 units/hr Check heparin level in 8 hours.   Phillis Knack, PharmD, BCPS   01/03/2014 4:30 AM

## 2014-01-03 NOTE — Progress Notes (Signed)
[facility] PROGRESS NOTE  Rick Church JJK:[id number] DOB: 1940/10/01 DOA: 12/31/2013 PCP: Antony Blackbird, MD  Assessment/Plan: 73 year old male with recent history of ARDS, legionella PNA, and COPD, HTN who presented with sepsis, HCAP, and NSTEMI. Pt noted to have been recently admitted 11/5-->12/7 for acute resp failure 2/2 CAP w/ + legionella Ag as well as ARDS s/p trach. Was D/Cd to SNF. Per pt, was sent home 12/21 from SNF. Since going home had generalized malaise and weakness. Sxs progressed.  Called EMS. Was noted to be SOB on eval w/ T 103.2.  -admitted with HCAP, NSTEMI   1. Sepsis/HCAP; CXR: New patchy opacity right lung base; blood cultures (12/22): NGTD  -improving on atx, bronchodilators, oxygen/titrate down;   2. NSTEMI; on IV heparin, plavix, statin; not on BB (due to severe COPD) -echo: LVEF 55-60%, AS, AVA 1.3 -no chest pains; LHC on monday per cards; cont monitor   3. COPD, no wheezing on exam; cont bronchodilators   Code Status: full Family Communication: d/w patient, called his family at [employer]   [phone]  No answer; try later  (indicate person spoken with, relationship, and if by phone, the number) Disposition Plan: pend LHC   Consultants:  Cardiology   Procedures:  none  Antibiotics:  Aztreonam 12/*23>>>>  vanc 12/23>>>>   (indicate start date, and stop date if known)  HPI/Subjective: Alert, oriented   Objective: Filed Vitals:   01/03/14 0749  BP: 127/64  Pulse: 72  Temp: 98 F (36.7 C)  Resp:     Intake/Output Summary (Last 24 hours) at 01/03/14 0905 Last data filed at 01/03/14 0857  Gross per 24 hour  Intake 3016.37 ml  Output   1750 ml  Net 1266.37 ml   Filed Weights   01/02/14 0000 01/02/14 0500 01/03/14 8101  Weight: 73.3 kg (161 lb 9.6 oz) 73.5 kg (162 lb 0.6 oz) 74.7 kg (164 lb 10.9 oz)    Exam:   General:  No distress   Cardiovascular: s1,s2 rrr  Respiratory: CTA BL  Abdomen: soft, nt,nd    Musculoskeletal: no LE edema    Data Reviewed: Basic Metabolic Panel:  Recent Labs Lab 12/31/13 1445 12/31/13 1959 01/01/14 0142 01/02/14 0350 01/03/14 0347  NA 138 139 140 139 142  K 3.3* 3.7 3.8 3.1* 4.0  CL 105 106 107 111 115*  CO2 24 26 28 24 21   GLUCOSE 136* 103* 94 96 93  BUN 19 18 17 19 12   CREATININE 1.09 0.98 0.89 0.72 0.72  CALCIUM 7.4* 7.1* 7.0* 7.0* 7.6*   Liver Function Tests:  Recent Labs Lab 12/31/13 1445 12/31/13 1959 01/01/14 0142 01/02/14 0350 01/03/14 0347  AST 37 34 37 27 37  ALT 20 19 20 23  39  ALKPHOS 80 74 77 78 81  BILITOT 0.4 0.5 0.7 0.2* 0.1*  PROT 4.9* 4.7* 5.0* 4.3* 4.6*  ALBUMIN 2.1* 1.9* 1.8* 1.6* 1.6*   No results for input(s): LIPASE, AMYLASE in the last 168 hours. No results for input(s): AMMONIA in the last 168 hours. CBC:  Recent Labs Lab 12/31/13 1445 12/31/13 1959 01/01/14 0142 01/02/14 0350 01/03/14 0347  WBC 14.5* 13.1* 15.0* 11.7* 10.0  NEUTROABS 11.1*  --  13.5*  --   --   HGB 9.5* 9.3* 9.5* 8.9* 8.8*  HCT 30.2* 29.4* 29.9* 27.9* 27.8*  MCV 91.8 92.5 92.0 91.8 92.4  PLT 235 198 224 223 249   Cardiac Enzymes:  Recent Labs Lab 12/31/13 1616 12/31/13 1959 01/01/14 0142  01/01/14 0754  TROPONINI 5.89* 5.03* 4.98* 5.31*   BNP (last 3 results)  Recent Labs  11/17/13 0400 11/24/13 0245  PROBNP 1388.0* 534.8*   CBG: No results for input(s): GLUCAP in the last 168 hours.  Recent Results (from the past 240 hour(s))  Blood Culture (routine x 2)     Status: None (Preliminary result)   Collection Time: 12/31/13  5:05 PM  Result Value Ref Range Status   Specimen Description BLOOD ARM RIGHT  Final   Special Requests BOTTLES DRAWN AEROBIC ONLY 5CC  Final   Culture  Setup Time   Final    01/01/2014 01:25 Performed at [facility]    Culture   Final           BLOOD CULTURE RECEIVED NO GROWTH TO DATE CULTURE WILL BE HELD FOR 5 DAYS BEFORE ISSUING A FINAL NEGATIVE REPORT Performed at [facility]    Report Status PENDING  Incomplete  Urine culture     Status: None   Collection Time: 12/31/13  6:10 PM  Result Value Ref Range Status   Specimen Description URINE, RANDOM  Final   Special Requests NONE  Final   Culture  Setup Time   Final    01/01/2014 00:45 Performed at [place] Performed at [facility]   Final   Culture NO GROWTH Performed at [facility]   Final   Report Status 01/01/2014 FINAL  Final  Blood Culture (routine x 2)     Status: None (Preliminary result)   Collection Time: 12/31/13  6:31 PM  Result Value Ref Range Status   Specimen Description BLOOD ARM RIGHT  Final   Special Requests BOTTLES DRAWN AEROBIC AND ANAEROBIC [name]  Final   Culture  Setup Time   Final    01/01/2014 01:25 Performed at [facility]    Culture   Final           BLOOD CULTURE RECEIVED NO GROWTH TO DATE CULTURE WILL BE HELD FOR 5 DAYS BEFORE ISSUING A FINAL NEGATIVE REPORT Performed at [facility]    Report Status PENDING  Incomplete     Studies: No results found.  Scheduled Meds: . atorvastatin  80 mg Oral q1800  . aztreonam  2 g Intravenous Q8H  . budesonide  0.25 mg Nebulization Q6H  . citalopram  10 mg Oral Daily  . clonazePAM  0.5 mg Oral BID  . clopidogrel  75 mg Oral Daily  . fentaNYL  50 mcg Transdermal Q72H  . ipratropium-albuterol  3 mL Nebulization Q6H  . pregabalin  75 mg Oral BID  . vancomycin  750 mg Intravenous Q12H   Continuous Infusions: . 0.9 % NaCl with KCl 20 mEq / L 75 mL/hr at 01/02/14 2055  . heparin 2,100 Units/hr (01/03/14 0434)    Principal Problem:   HCAP (healthcare-associated pneumonia) Active Problems:   Acute respiratory failure   COPD with emphysema   Demand ischemia   NSTEMI (non-ST elevated myocardial infarction)   Sepsis    Time spent: >35 minutes     Kinnie Feil  [facility] Pager [phone]. If 7PM-7AM, please contact  night-coverage at [email or web address], password [facility] 01/03/2014, 9:05 AM  LOS: 3 days

## 2014-01-03 NOTE — Progress Notes (Signed)
ANTICOAGULATION CONSULT NOTE - Follow Up Consult  Pharmacy Consult for Heparin  Indication: chest pain/ACS  Allergies  Allergen Reactions  . Codeine Nausea And Vomiting  . Indocin [Indomethacin] Nausea Only    dizzy  . Penicillins Hives    Tolerated a dose of cefepime 12/31/13  . Asa [Aspirin] Itching and Rash    Patient Measurements: Height: 5\' 7"  (170.2 cm) Weight: 164 lb 10.9 oz (74.7 kg) IBW/kg (Calculated) : 66.1  Vital Signs: Temp: 98.3 F (36.8 C) (12/25 2035) Temp Source: Oral (12/25 2035) BP: 145/65 mmHg (12/25 2035) Pulse Rate: 73 (12/25 2035)  Labs:  Recent Labs  01/01/14 0142 01/01/14 0754  01/02/14 0350  01/03/14 0347 01/03/14 1205 01/03/14 2300  HGB 9.5*  --   --  8.9*  --  8.8*  --   --   HCT 29.9*  --   --  27.9*  --  27.8*  --   --   PLT 224  --   --  223  --  249  --   --   HEPARINUNFRC <0.10*  --   < > <0.10*  < > 0.80* >2.20* 0.32  CREATININE 0.89  --   --  0.72  --  0.72  --   --   TROPONINI 4.98* 5.31*  --   --   --   --   --   --   < > = values in this interval not displayed.  Estimated Creatinine Clearance: 79.2 mL/min (by C-G formula based on Cr of 0.72).   Assessment: Therapeutic heparin level x 1 after rate decrease  Goal of Therapy:  Heparin level 0.3-0.7 units/ml Monitor platelets by anticoagulation protocol: Yes   Plan:  -Continue heparin drip at 1900 units/hr -HL with AM labs -Daily CBC/HL -Monitor for bleeding -Cath monday  Narda Bonds 01/03/2014,11:38 PM

## 2014-01-03 NOTE — Progress Notes (Addendum)
Patient ID: Rick Church, male   DOB: January 06, 1941, 73 y.o.   MRN: [id number]  Please see my complete note from January 02, 2014. The plan at that time was outlined for cardiac catheterization on Monday, January 06, 2014. The patient is listed on the cath board. Orders have been completed. The patient has an allergy to aspirin with a rash by history. He is on Plavix.  Daryel November, MD

## 2014-01-03 NOTE — Progress Notes (Signed)
ANTICOAGULATION and ANTIBIOTIC CONSULT NOTE - Follow Up Consult  Pharmacy Consult for Heparin, Vancomycin, and Aztreonam Indication: chest pain/ACS  Allergies  Allergen Reactions  . Codeine Nausea And Vomiting  . Indocin [Indomethacin] Nausea Only    dizzy  . Penicillins Hives    Tolerated a dose of cefepime 12/31/13  . Asa [Aspirin] Itching and Rash    Patient Measurements: Height: 5\' 7"  (170.2 cm) Weight: 164 lb 10.9 oz (74.7 kg) IBW/kg (Calculated) : 66.1 Heparin Dosing Weight = TBW = 73.5  Vital Signs: Temp: 98.1 F (36.7 C) (12/25 1236) Temp Source: Oral (12/25 1236) BP: 125/72 mmHg (12/25 1236) Pulse Rate: 72 (12/25 1236)  Labs:  Recent Labs  12/31/13 1803 12/31/13 1959 01/01/14 0142 01/01/14 0754  01/02/14 0350  01/02/14 2037 01/03/14 0347 01/03/14 1205  HGB  --  9.3* 9.5*  --   --  8.9*  --   --  8.8*  --   HCT  --  29.4* 29.9*  --   --  27.9*  --   --  27.8*  --   PLT  --  198 224  --   --  223  --   --  249  --   APTT 36  --   --   --   --   --   --   --   --   --   LABPROT  --  17.8*  --   --   --   --   --   --   --   --   INR  --  1.45  --   --   --   --   --   --   --   --   HEPARINUNFRC  --   --  <0.10*  --   < > <0.10*  < > 0.19* 0.80* >2.20*  CREATININE  --  0.98 0.89  --   --  0.72  --   --  0.72  --   TROPONINI  --  5.03* 4.98* 5.31*  --   --   --   --   --   --   < > = values in this interval not displayed.  Estimated Creatinine Clearance: 79.2 mL/min (by C-G formula based on Cr of 0.72).  Assessment: 54 yoM presents in respiratory distress with several day history of constant aching left sided chest pain. Patient continues on IV heparin for anticoagulation. Heparin has been variable since initiation. Heparin was slightly supratherapeutic this morning at 0.8 and rate was decreased slightly by 50 units/hr.  Follow-up 8-hour level was supratherapeutic at > 2.2. Hemoglobin and platelets are stable. Per nurse, heparin level was drawn from opposite  arm infusing heparin. No bleeding noted. Planning cardiac cath on Monday.   Note: Patient on Vancomycin D#4, Aztreonam D#5. SCr stable. Vanc trough ordered for 0600. Patient remains afebrile, WBC 10. Cultures are negative.  Goal of Therapy:  Heparin level 0.3-0.7 units/ml Monitor platelets by anticoagulation protocol: Yes   Plan:  1. Hold Heparin for ~1.5 hours. Restart Heparin at 1530 at 1900 units/hr. 2. Check 8-hour HL @ 2330 3. Daily HL and CBC 4. VT on am at 0600  5. Continue vanc 750mg  IV Q12H  6. Continue aztreo 2gm IV Q8H   Theron Arista, PharmD Clinical Pharmacist - Resident Pager: [phone] 12/25/20152:26 PM

## 2014-01-04 LAB — VANCOMYCIN, TROUGH
VANCOMYCIN TR: 30.1 ug/mL — AB (ref 10.0–20.0)
VANCOMYCIN TR: 12.6 ug/mL (ref 10.0–20.0)

## 2014-01-04 LAB — HEPARIN LEVEL (UNFRACTIONATED): HEPARIN UNFRACTIONATED: 0.56 [IU]/mL (ref 0.30–0.70)

## 2014-01-04 MED ORDER — METOPROLOL TARTRATE 12.5 MG HALF TABLET
12.5000 mg | ORAL_TABLET | Freq: Two times a day (BID) | ORAL | Status: DC
  Administered 2014-01-04 – 2014-01-08 (×8): 12.5 mg via ORAL
  Filled 2014-01-04 (×9): qty 1

## 2014-01-04 MED ORDER — HEPARIN (PORCINE) IN NACL 100-0.45 UNIT/ML-% IJ SOLN
1850.0000 [IU]/h | INTRAMUSCULAR | Status: DC
  Administered 2014-01-04 – 2014-01-05 (×3): 1850 [IU]/h via INTRAVENOUS
  Filled 2014-01-04 (×6): qty 250

## 2014-01-04 MED ORDER — FUROSEMIDE 10 MG/ML IJ SOLN
20.0000 mg | Freq: Once | INTRAMUSCULAR | Status: AC
  Administered 2014-01-04: 20 mg via INTRAVENOUS

## 2014-01-04 NOTE — Progress Notes (Addendum)
Patient Name: Rick Church Date of Encounter: 01/04/2014    Principal Problem:   HCAP (healthcare-associated pneumonia) Active Problems:   Acute respiratory failure   COPD with emphysema   Demand ischemia   NSTEMI (non-ST elevated myocardial infarction)   Sepsis    SUBJECTIVE  No chest pain or dyspnea  CURRENT MEDS . atorvastatin  80 mg Oral q1800  . budesonide  0.25 mg Nebulization Q6H  . citalopram  10 mg Oral Daily  . clonazePAM  0.5 mg Oral BID  . clopidogrel  75 mg Oral Daily  . fentaNYL  50 mcg Transdermal Q72H  . ipratropium-albuterol  3 mL Nebulization Q6H  . levofloxacin (LEVAQUIN) IV  750 mg Intravenous Q24H  . pregabalin  75 mg Oral BID    OBJECTIVE  Filed Vitals:   01/04/14 0216 01/04/14 0449 01/04/14 0732 01/04/14 0814  BP:  128/58 126/53   Pulse:  62 54   Temp:  98 F (36.7 C) 98.2 F (36.8 C)   TempSrc:  Oral Axillary   Resp:  18 20   Height:      Weight:  164 lb 14.5 oz (74.8 kg)    SpO2: 96% 95% 96% 92%    Intake/Output Summary (Last 24 hours) at 01/04/14 1113 Last data filed at 01/04/14 1000  Gross per 24 hour  Intake 1424.78 ml  Output   2250 ml  Net -825.22 ml   Filed Weights   01/02/14 0500 01/03/14 0608 01/04/14 0449  Weight: 162 lb 0.6 oz (73.5 kg) 164 lb 10.9 oz (74.7 kg) 164 lb 14.5 oz (74.8 kg)    PHYSICAL EXAM  General: Pleasant, NAD. Neuro: Alert and oriented X 3. Moves all extremities spontaneously. HEENT:  Normal  Neck: Supple  Lungs:  Diminished BS right Heart: RRR 2/6 systolic murmur apex Abdomen: Soft, non-tender, non-distended Extremities: No edema.   Accessory Clinical Findings  CBC  Recent Labs  01/02/14 0350 01/03/14 0347  WBC 11.7* 10.0  HGB 8.9* 8.8*  HCT 27.9* 27.8*  MCV 91.8 92.4  PLT 223 536   Basic Metabolic Panel  Recent Labs  01/02/14 0350 01/03/14 0347  NA 139 142  K 3.1* 4.0  CL 111 115*  CO2 24 21  GLUCOSE 96 93  BUN 19 12  CREATININE 0.72 0.72  CALCIUM 7.0* 7.6*    Liver Function Tests  Recent Labs  01/02/14 0350 01/03/14 0347  AST 27 37  ALT 23 39  ALKPHOS 78 81  BILITOT 0.2* 0.1*  PROT 4.3* 4.6*  ALBUMIN 1.6* 1.6*   TELE  Rsr, rare pvc's.   Radiology/Studies  Dg Chest 2 View  12/31/2013   CLINICAL DATA:  Shortness of breath.  EXAM: CHEST  2 VIEW  COMPARISON:  12/05/2013  FINDINGS: There is new patchy consolidation in the right lower lobe. Continued improvement in left perihilar opacity from prior. There is a diminutive right pleural effusion. The cardiomediastinal contours are unchanged. There is no pneumothorax. Stable osseous structures.  IMPRESSION: New patchy opacity right lung base. These may reflect pneumonia, consider aspiration given distribution. Continued improvement in left perihilar opacity.   Electronically Signed   By: Jeb Levering M.D.   On: 12/31/2013 15:48    ASSESSMENT AND PLAN  1.  HCAP:  Abx per IM.    2.  NSTEMI:  Continue Plavix, heparin and statin. Add metoprolol. 4 catheterization on Monday as outlined by Dr. Ron Parker.  3. Aortic stenosis-moderate almost recent echocardiogram. Will need to be followed as  an outpatient. Patient mildly volume overloaded; will give lasix 20 mg IV x 1.   [name], MD, [facility] 01/04/2014 11:13 AM

## 2014-01-04 NOTE — Progress Notes (Signed)
ANTICOAGULATION and ANTIBIOTIC CONSULT NOTE - Follow Up Consult  Pharmacy Consult for Heparin and Vancomycin Indication: chest pain/ACS and HCAP  Allergies  Allergen Reactions  . Codeine Nausea And Vomiting  . Indocin [Indomethacin] Nausea Only    dizzy  . Penicillins Hives    Tolerated a dose of cefepime 12/31/13  . Asa [Aspirin] Itching and Rash    Patient Measurements: Height: 5\' 7"  (170.2 cm) Weight: 164 lb 14.5 oz (74.8 kg) IBW/kg (Calculated) : 66.1 Heparin Dosing Weight = TBW = 73.5  Vital Signs: Temp: 98.2 F (36.8 C) (12/26 0732) Temp Source: Axillary (12/26 0732) BP: 126/53 mmHg (12/26 0732) Pulse Rate: 54 (12/26 0732)  Labs:  Recent Labs  01/02/14 0350  01/03/14 0347 01/03/14 1205 01/03/14 2300 01/04/14 0810  HGB 8.9*  --  8.8*  --   --   --   HCT 27.9*  --  27.8*  --   --   --   PLT 223  --  249  --   --   --   HEPARINUNFRC <0.10*  < > 0.80* >2.20* 0.32 0.56  CREATININE 0.72  --  0.72  --   --   --   < > = values in this interval not displayed.  Estimated Creatinine Clearance: 79.2 mL/min (by C-G formula based on Cr of 0.72).  Assessment: 29 yoM presents in respiratory distress with several day history of constant aching left sided chest pain.   ANTICOAGULATION: Patient continues on IV heparin for anticoagulation. Heparin level has been variable since initiation. HL now remains therapeutic x 2 at 0.56 but has trended up from 0.32. H/H remains low but stable, plt wnl and stable with no reported s/s bleeding. Planning cardiac cath on Monday.   ANTIBIOTIC: Patient on Vancomycin D#5. Aztreonam changed to LVQ on 12/25. SCr stable. Patient remains afebrile, WBC 10. Cultures are negative so far. VT this am is elevated at 30.1 but dose was given prior to lab being drawn. Will order another VT prior to the next dose. Spoke with RN about holding off on next dose until the lab is drawn.   Goal of Therapy:  Heparin level 0.3-0.7 units/ml Monitor platelets by  anticoagulation protocol: Yes   Plan:  - Decrease heparin gtt slightly to 1850 u/hr to maintain in goal range - Daily HL/CBC - Continue vanc 750mg  IV Q12H  - VT prior to next dose - Monitor renal function, temp, WBC, C&S, levels prn   Palmer Fahrner K. Velva Harman, PharmD Clinical Pharmacist - Resident Pager: [phone] Pharmacy: [phone] 01/04/2014 9:03 AM

## 2014-01-04 NOTE — Progress Notes (Signed)
[facility] PROGRESS NOTE  Rick Church QQV:[id number] DOB: 06/07/1940 DOA: 12/31/2013 PCP: Antony Blackbird, MD  Assessment/Plan: 73 year old male with recent history of ARDS, legionella PNA, and COPD, HTN who presented with sepsis, HCAP, and NSTEMI. Pt noted to have been recently admitted 11/5-->12/7 for acute resp failure 2/2 CAP w/ + legionella Ag as well as ARDS s/p trach. Was D/Cd to SNF. Per pt, was sent home 12/21 from SNF. Since going home had generalized malaise and weakness. Sxs progressed.  Called EMS. Was noted to be SOB on eval w/ T 103.2.  -admitted with HCAP, NSTEMI   1. Sepsis/HCAP; CXR: New patchy opacity right lung base; blood cultures (12/22): NGTD  -afebrile, leukocytosis resolved; improved on atx (titrated to levoflox), cont bronchodilators, oxygen/titrate down;   2. NSTEMI; on IV heparin, plavix, statin; not on BB (due to severe COPD) -echo: LVEF 55-60%, AS, AVA 1.3 -no chest pains; LHC on monday per cards; cont monitor   3. COPD, no wheezing on exam; cont bronchodilators  4. Anemia; no s/s of bleeding; will monitor on IV heparin;   Code Status: full Family Communication: d/w patient, called his family at [employer]   [phone]  No answer; will try later again  (indicate person spoken with, relationship, and if by phone, the number) Disposition Plan: pend LHC   Consultants:  Cardiology   Procedures:  none  Antibiotics:  Aztreonam 12/*23>>>>12/25  vanc 12/23>>>>12/25  Levofloxacin 12/25<<<<<   (indicate start date, and stop date if known)  HPI/Subjective: Alert, oriented   Objective: Filed Vitals:   01/04/14 0732  BP: 126/53  Pulse: 54  Temp: 98.2 F (36.8 C)  Resp: 20    Intake/Output Summary (Last 24 hours) at 01/04/14 0934 Last data filed at 01/04/14 6606  Gross per 24 hour  Intake 1676.38 ml  Output   2250 ml  Net -573.62 ml   Filed Weights   01/02/14 0500 01/03/14 0608 01/04/14 0449  Weight: 73.5 kg (162 lb  0.6 oz) 74.7 kg (164 lb 10.9 oz) 74.8 kg (164 lb 14.5 oz)    Exam:   General:  No distress   Cardiovascular: s1,s2 rrr  Respiratory: CTA BL  Abdomen: soft, nt,nd   Musculoskeletal: no LE edema    Data Reviewed: Basic Metabolic Panel:  Recent Labs Lab 12/31/13 1445 12/31/13 1959 01/01/14 0142 01/02/14 0350 01/03/14 0347  NA 138 139 140 139 142  K 3.3* 3.7 3.8 3.1* 4.0  CL 105 106 107 111 115*  CO2 24 26 28 24 21   GLUCOSE 136* 103* 94 96 93  BUN 19 18 17 19 12   CREATININE 1.09 0.98 0.89 0.72 0.72  CALCIUM 7.4* 7.1* 7.0* 7.0* 7.6*   Liver Function Tests:  Recent Labs Lab 12/31/13 1445 12/31/13 1959 01/01/14 0142 01/02/14 0350 01/03/14 0347  AST 37 34 37 27 37  ALT 20 19 20 23  39  ALKPHOS 80 74 77 78 81  BILITOT 0.4 0.5 0.7 0.2* 0.1*  PROT 4.9* 4.7* 5.0* 4.3* 4.6*  ALBUMIN 2.1* 1.9* 1.8* 1.6* 1.6*   No results for input(s): LIPASE, AMYLASE in the last 168 hours. No results for input(s): AMMONIA in the last 168 hours. CBC:  Recent Labs Lab 12/31/13 1445 12/31/13 1959 01/01/14 0142 01/02/14 0350 01/03/14 0347  WBC 14.5* 13.1* 15.0* 11.7* 10.0  NEUTROABS 11.1*  --  13.5*  --   --   HGB 9.5* 9.3* 9.5* 8.9* 8.8*  HCT 30.2* 29.4* 29.9* 27.9* 27.8*  MCV 91.8 92.5  92.0 91.8 92.4  PLT 235 198 224 223 249   Cardiac Enzymes:  Recent Labs Lab 12/31/13 1616 12/31/13 1959 01/01/14 0142 01/01/14 0754  TROPONINI 5.89* 5.03* 4.98* 5.31*   BNP (last 3 results)  Recent Labs  11/17/13 0400 11/24/13 0245  PROBNP 1388.0* 534.8*   CBG: No results for input(s): GLUCAP in the last 168 hours.  Recent Results (from the past 240 hour(s))  Blood Culture (routine x 2)     Status: None (Preliminary result)   Collection Time: 12/31/13  5:05 PM  Result Value Ref Range Status   Specimen Description BLOOD ARM RIGHT  Final   Special Requests BOTTLES DRAWN AEROBIC ONLY 5CC  Final   Culture  Setup Time   Final    01/01/2014 01:25 Performed at [facility]    Culture   Final           BLOOD CULTURE RECEIVED NO GROWTH TO DATE CULTURE WILL BE HELD FOR 5 DAYS BEFORE ISSUING A FINAL NEGATIVE REPORT Performed at [facility]    Report Status PENDING  Incomplete  Urine culture     Status: None   Collection Time: 12/31/13  6:10 PM  Result Value Ref Range Status   Specimen Description URINE, RANDOM  Final   Special Requests NONE  Final   Culture  Setup Time   Final    01/01/2014 00:45 Performed at [place] Performed at [facility]   Final   Culture NO GROWTH Performed at [facility]   Final   Report Status 01/01/2014 FINAL  Final  Blood Culture (routine x 2)     Status: None (Preliminary result)   Collection Time: 12/31/13  6:31 PM  Result Value Ref Range Status   Specimen Description BLOOD ARM RIGHT  Final   Special Requests BOTTLES DRAWN AEROBIC AND ANAEROBIC [facility]  Final   Culture  Setup Time   Final    01/01/2014 01:25 Performed at [facility]    Culture   Final           BLOOD CULTURE RECEIVED NO GROWTH TO DATE CULTURE WILL BE HELD FOR 5 DAYS BEFORE ISSUING A FINAL NEGATIVE REPORT Performed at [facility]    Report Status PENDING  Incomplete     Studies: No results found.  Scheduled Meds: . atorvastatin  80 mg Oral q1800  . budesonide  0.25 mg Nebulization Q6H  . citalopram  10 mg Oral Daily  . clonazePAM  0.5 mg Oral BID  . clopidogrel  75 mg Oral Daily  . fentaNYL  50 mcg Transdermal Q72H  . ipratropium-albuterol  3 mL Nebulization Q6H  . levofloxacin (LEVAQUIN) IV  750 mg Intravenous Q24H  . pregabalin  75 mg Oral BID  . vancomycin  750 mg Intravenous Q12H   Continuous Infusions: . heparin      Principal Problem:   HCAP (healthcare-associated pneumonia) Active Problems:   Acute respiratory failure   COPD with emphysema   Demand ischemia   NSTEMI (non-ST elevated myocardial infarction)   Sepsis    Time spent: >35  minutes     Kinnie Feil  [facility] Pager [phone]. If 7PM-7AM, please contact night-coverage at [email or web address], password [facility] 01/04/2014, 9:34 AM  LOS: 4 days

## 2014-01-05 ENCOUNTER — Encounter (HOSPITAL_COMMUNITY): Payer: Self-pay | Admitting: *Deleted

## 2014-01-05 LAB — CBC
HCT: 25.3 % — ABNORMAL LOW (ref 39.0–52.0)
HEMOGLOBIN: 8.2 g/dL — AB (ref 13.0–17.0)
MCH: 28.9 pg (ref 26.0–34.0)
MCHC: 32.4 g/dL (ref 30.0–36.0)
MCV: 89.1 fL (ref 78.0–100.0)
Platelets: 293 10*3/uL (ref 150–400)
RBC: 2.84 MIL/uL — AB (ref 4.22–5.81)
RDW: 15.1 % (ref 11.5–15.5)
WBC: 8.6 10*3/uL (ref 4.0–10.5)

## 2014-01-05 LAB — IRON AND TIBC
Iron: 27 ug/dL — ABNORMAL LOW (ref 42–165)
Saturation Ratios: 24 % (ref 20–55)
TIBC: 114 ug/dL — ABNORMAL LOW (ref 215–435)
UIBC: 87 ug/dL — ABNORMAL LOW (ref 125–400)

## 2014-01-05 LAB — BASIC METABOLIC PANEL
ANION GAP: 6 (ref 5–15)
BUN: <5 mg/dL — ABNORMAL LOW (ref 6–23)
CHLORIDE: 110 meq/L (ref 96–112)
CO2: 24 mmol/L (ref 19–32)
CREATININE: 0.72 mg/dL (ref 0.50–1.35)
Calcium: 7.7 mg/dL — ABNORMAL LOW (ref 8.4–10.5)
GFR calc Af Amer: >90 mL/min (ref >90)
GFR calc non Af Amer: >90 mL/min (ref >90)
GLUCOSE: 89 mg/dL (ref 70–99)
Potassium: 2.7 mmol/L — CL (ref 3.5–5.1)
Sodium: 140 mmol/L (ref 135–145)

## 2014-01-05 LAB — HEPARIN LEVEL (UNFRACTIONATED): Heparin Unfractionated: 0.39 IU/mL (ref 0.30–0.70)

## 2014-01-05 LAB — FERRITIN: FERRITIN: 766 ng/mL — AB (ref 22–322)

## 2014-01-05 LAB — POTASSIUM: Potassium: 3.3 mmol/L — ABNORMAL LOW (ref 3.5–5.1)

## 2014-01-05 MED ORDER — FUROSEMIDE 20 MG PO TABS
20.0000 mg | ORAL_TABLET | Freq: Every day | ORAL | Status: DC
  Administered 2014-01-05 – 2014-01-07 (×3): 20 mg via ORAL
  Filled 2014-01-05 (×4): qty 1

## 2014-01-05 MED ORDER — LEVOFLOXACIN 750 MG PO TABS
750.0000 mg | ORAL_TABLET | Freq: Every day | ORAL | Status: DC
  Administered 2014-01-05 – 2014-01-08 (×4): 750 mg via ORAL
  Filled 2014-01-05 (×4): qty 1

## 2014-01-05 MED ORDER — POTASSIUM CHLORIDE CRYS ER 10 MEQ PO TBCR
30.0000 meq | EXTENDED_RELEASE_TABLET | ORAL | Status: AC
  Administered 2014-01-05 (×2): 30 meq via ORAL
  Filled 2014-01-05 (×2): qty 1

## 2014-01-05 MED ORDER — POTASSIUM CHLORIDE 10 MEQ/100ML IV SOLN
10.0000 meq | INTRAVENOUS | Status: AC
  Administered 2014-01-05 (×4): 10 meq via INTRAVENOUS
  Filled 2014-01-05 (×5): qty 100

## 2014-01-05 NOTE — Progress Notes (Addendum)
CRITICAL VALUE ALERT  Critical value received:  Potassium 2.7  Date of notification:  01/05/2014  Time of notification:  0430  Critical value read back:Yes.    Nurse who received alert:  Annice Pih  MD notified (1st page):  Walden Field, [facility]  NP  Time of first page:  0430  MD notified (2nd page):  Time of second page:  Responding MD:  M. Donnal Debar, NP  Time MD responded:  [phone]

## 2014-01-05 NOTE — Progress Notes (Signed)
Patient Name: Rick Church Date of Encounter: 01/05/2014    Principal Problem:   HCAP (healthcare-associated pneumonia) Active Problems:   Acute respiratory failure   COPD with emphysema   Demand ischemia   NSTEMI (non-ST elevated myocardial infarction)   Sepsis    SUBJECTIVE  No chest pain or dyspnea  CURRENT MEDS . atorvastatin  80 mg Oral q1800  . budesonide  0.25 mg Nebulization Q6H  . citalopram  10 mg Oral Daily  . clonazePAM  0.5 mg Oral BID  . clopidogrel  75 mg Oral Daily  . fentaNYL  50 mcg Transdermal Q72H  . ipratropium-albuterol  3 mL Nebulization Q6H  . levofloxacin  750 mg Oral Daily  . metoprolol tartrate  12.5 mg Oral BID WC  . pregabalin  75 mg Oral BID    OBJECTIVE  Filed Vitals:   01/05/14 0345 01/05/14 0600 01/05/14 0732 01/05/14 0800  BP: 138/51 130/61 146/67 134/62  Pulse: 71 63 65 69  Temp: 98.8 F (37.1 C)  99.4 F (37.4 C)   TempSrc: Oral  Axillary   Resp:   16   Height:      Weight: 167 lb 1.7 oz (75.8 kg)     SpO2: 94% 95% 95% 98%    Intake/Output Summary (Last 24 hours) at 01/05/14 1129 Last data filed at 01/05/14 0717  Gross per 24 hour  Intake 1041.5 ml  Output   2555 ml  Net -1513.5 ml   Filed Weights   01/03/14 0608 01/04/14 0449 01/05/14 0345  Weight: 164 lb 10.9 oz (74.7 kg) 164 lb 14.5 oz (74.8 kg) 167 lb 1.7 oz (75.8 kg)    PHYSICAL EXAM  General: Pleasant, NAD. Neuro: Alert and oriented X 3. Moves all extremities spontaneously. HEENT:  Normal  Neck: Supple  Lungs:  Diminished BS right Heart: RRR 2/6 systolic murmur apex Abdomen: Soft, non-tender, non-distended Extremities: 1+ edema.   Accessory Clinical Findings  CBC  Recent Labs  01/03/14 0347 01/05/14 0348  WBC 10.0 8.6  HGB 8.8* 8.2*  HCT 27.8* 25.3*  MCV 92.4 89.1  PLT 249 007   Basic Metabolic Panel  Recent Labs  01/03/14 0347 01/05/14 0348  NA 142 140  K 4.0 2.7*  CL 115* 110  CO2 21 24  GLUCOSE 93 89  BUN 12 <5*    CREATININE 0.72 0.72  CALCIUM 7.6* 7.7*   Liver Function Tests  Recent Labs  01/03/14 0347  AST 37  ALT 39  ALKPHOS 81  BILITOT 0.1*  PROT 4.6*  ALBUMIN 1.6*   TELE  Rsr, rare pvc's.   Radiology/Studies  Dg Chest 2 View  12/31/2013   CLINICAL DATA:  Shortness of breath.  EXAM: CHEST  2 VIEW  COMPARISON:  12/05/2013  FINDINGS: There is new patchy consolidation in the right lower lobe. Continued improvement in left perihilar opacity from prior. There is a diminutive right pleural effusion. The cardiomediastinal contours are unchanged. There is no pneumothorax. Stable osseous structures.  IMPRESSION: New patchy opacity right lung base. These may reflect pneumonia, consider aspiration given distribution. Continued improvement in left perihilar opacity.   Electronically Signed   By: Jeb Levering M.D.   On: 12/31/2013 15:48    ASSESSMENT AND PLAN  1.  HCAP:  Abx per IM.    2.  NSTEMI:  Continue Plavix (allergy to asa), heparin, statin and metoprolol. For catheterization on Monday as outlined by Dr. Ron Parker.  3. Aortic stenosis-moderate almost recent echocardiogram. Will need to  be followed as an outpatient.   4. Acute diastolic CHF-add lasix 20 mg daily and follow renal function  5. Hypokalemia-supplement   [name], MD, [facility] 01/05/2014 11:29 AM

## 2014-01-05 NOTE — Progress Notes (Signed)
Patient sats drop to 88 during sleep. Patient is breathing only through his mouth while sleeping. Nasal cannula at 3L.

## 2014-01-05 NOTE — Progress Notes (Signed)
[facility] PROGRESS NOTE  Rick Church LKG:[id number] DOB: 03-21-1940 DOA: 12/31/2013 PCP: Antony Blackbird, MD  Assessment/Plan: 73 year old male with recent history of ARDS, legionella PNA, and COPD, HTN who presented with sepsis, HCAP, and NSTEMI. Pt noted to have been recently admitted 11/5-->12/7 for acute resp failure 2/2 CAP w/ + legionella Ag as well as ARDS s/p trach. Was D/Cd to SNF. Per pt, was sent home 12/21 from SNF. Since going home had generalized malaise and weakness. Sxs progressed.  Called EMS. Was noted to be SOB on eval w/ T 103.2.  -admitted with HCAP, NSTEMI   1. Sepsis/HCAP; CXR: New patchy opacity right lung base; blood cultures (12/22): NGTD  -afebrile, leukocytosis resolved; improved on atx (titrated to levoflox), cont bronchodilators, oxygen/titrate down  2. NSTEMI; on IV heparin, plavix, statin; not on BB (due to severe COPD) -echo: LVEF 55-60%, AS, AVA 1.3 -no chest pains; LHC on monday per cards; cont monitor   3. COPD, no wheezing on exam; cont bronchodilators  4. Anemia; no s/s of bleeding; will monitor on IV heparin; Iron panel consistent with AOCD 5. Hypokalemia: replace and recheck later today  Code Status: full Family Communication: d/w patient Luca, Dyar   [phone]   Disposition Plan: pend LHC   Consultants:  Cardiology   Procedures:  none  Antibiotics:  Aztreonam 12/*23>>>>12/25  vanc 12/23>>>>12/25  Levofloxacin 12/25<<<<<   (indicate start date, and stop date if known)  HPI/Subjective: Alert, oriented   Objective: Filed Vitals:   01/05/14 1200  BP: 112/61  Pulse: 63  Temp:   Resp:     Intake/Output Summary (Last 24 hours) at 01/05/14 1223 Last data filed at 01/05/14 1200  Gross per 24 hour  Intake   1746 ml  Output   2495 ml  Net   -749 ml   Filed Weights   01/03/14 0608 01/04/14 0449 01/05/14 0345  Weight: 74.7 kg (164 lb 10.9 oz) 74.8 kg (164 lb 14.5 oz) 75.8 kg (167 lb 1.7 oz)     Exam:   General:  AAO x 3 No distress   Cardiovascular: s1,s2 rrr  Respiratory: Crackles in RLL   Abdomen: soft, nt,nd   Musculoskeletal: no LE edema    Data Reviewed: Basic Metabolic Panel:  Recent Labs Lab 12/31/13 1959 01/01/14 0142 01/02/14 0350 01/03/14 0347 01/05/14 0348  NA 139 140 139 142 140  K 3.7 3.8 3.1* 4.0 2.7*  CL 106 107 111 115* 110  CO2 26 28 24 21 24   GLUCOSE 103* 94 96 93 89  BUN 18 17 19 12  <5*  CREATININE 0.98 0.89 0.72 0.72 0.72  CALCIUM 7.1* 7.0* 7.0* 7.6* 7.7*   Liver Function Tests:  Recent Labs Lab 12/31/13 1445 12/31/13 1959 01/01/14 0142 01/02/14 0350 01/03/14 0347  AST 37 34 37 27 37  ALT 20 19 20 23  39  ALKPHOS 80 74 77 78 81  BILITOT 0.4 0.5 0.7 0.2* 0.1*  PROT 4.9* 4.7* 5.0* 4.3* 4.6*  ALBUMIN 2.1* 1.9* 1.8* 1.6* 1.6*   No results for input(s): LIPASE, AMYLASE in the last 168 hours. No results for input(s): AMMONIA in the last 168 hours. CBC:  Recent Labs Lab 12/31/13 1445 12/31/13 1959 01/01/14 0142 01/02/14 0350 01/03/14 0347 01/05/14 0348  WBC 14.5* 13.1* 15.0* 11.7* 10.0 8.6  NEUTROABS 11.1*  --  13.5*  --   --   --   HGB 9.5* 9.3* 9.5* 8.9* 8.8* 8.2*  HCT 30.2* 29.4* 29.9* 27.9* 27.8* 25.3*  MCV 91.8 92.5 92.0 91.8 92.4 89.1  PLT 235 198 224 223 249 293   Cardiac Enzymes:  Recent Labs Lab 12/31/13 1616 12/31/13 1959 01/01/14 0142 01/01/14 0754  TROPONINI 5.89* 5.03* 4.98* 5.31*   BNP (last 3 results)  Recent Labs  11/17/13 0400 11/24/13 0245  PROBNP 1388.0* 534.8*   CBG: No results for input(s): GLUCAP in the last 168 hours.  Recent Results (from the past 240 hour(s))  Blood Culture (routine x 2)     Status: None (Preliminary result)   Collection Time: 12/31/13  5:05 PM  Result Value Ref Range Status   Specimen Description BLOOD ARM RIGHT  Final   Special Requests BOTTLES DRAWN AEROBIC ONLY 5CC  Final   Culture  Setup Time   Final    01/01/2014 01:25 Performed at [facility]    Culture   Final           BLOOD CULTURE RECEIVED NO GROWTH TO DATE CULTURE WILL BE HELD FOR 5 DAYS BEFORE ISSUING A FINAL NEGATIVE REPORT Performed at [facility]    Report Status PENDING  Incomplete  Urine culture     Status: None   Collection Time: 12/31/13  6:10 PM  Result Value Ref Range Status   Specimen Description URINE, RANDOM  Final   Special Requests NONE  Final   Culture  Setup Time   Final    01/01/2014 00:45 Performed at [place] Performed at [facility]   Final   Culture NO GROWTH Performed at [facility]   Final   Report Status 01/01/2014 FINAL  Final  Blood Culture (routine x 2)     Status: None (Preliminary result)   Collection Time: 12/31/13  6:31 PM  Result Value Ref Range Status   Specimen Description BLOOD ARM RIGHT  Final   Special Requests BOTTLES DRAWN AEROBIC AND ANAEROBIC [facility]  Final   Culture  Setup Time   Final    01/01/2014 01:25 Performed at [facility]    Culture   Final           BLOOD CULTURE RECEIVED NO GROWTH TO DATE CULTURE WILL BE HELD FOR 5 DAYS BEFORE ISSUING A FINAL NEGATIVE REPORT Performed at [facility]    Report Status PENDING  Incomplete     Studies: No results found.  Scheduled Meds: . atorvastatin  80 mg Oral q1800  . budesonide  0.25 mg Nebulization Q6H  . citalopram  10 mg Oral Daily  . clonazePAM  0.5 mg Oral BID  . clopidogrel  75 mg Oral Daily  . fentaNYL  50 mcg Transdermal Q72H  . furosemide  20 mg Oral Daily  . ipratropium-albuterol  3 mL Nebulization Q6H  . levofloxacin  750 mg Oral Daily  . metoprolol tartrate  12.5 mg Oral BID WC  . pregabalin  75 mg Oral BID   Continuous Infusions: . heparin 1,850 Units/hr (01/05/14 0859)      Time spent: >35 minutes     Debbe Odea, MD  [facility] Pager- [email or web address], password [facility] 01/05/2014, 12:23 PM  LOS: 5 days

## 2014-01-05 NOTE — Progress Notes (Signed)
ANTICOAGULATION CONSULT NOTE - Follow Up Consult  Pharmacy Consult for Heparin  Indication: chest pain/ACS  Allergies  Allergen Reactions  . Codeine Nausea And Vomiting  . Indocin [Indomethacin] Nausea Only    dizzy  . Penicillins Hives    Tolerated a dose of cefepime 12/31/13  . Asa [Aspirin] Itching and Rash    Patient Measurements: Height: 5\' 7"  (170.2 cm) Weight: 167 lb 1.7 oz (75.8 kg) IBW/kg (Calculated) : 66.1 Heparin Dosing Weight = TBW = 73.5  Vital Signs: Temp: 99.4 F (37.4 C) (12/27 0732) Temp Source: Axillary (12/27 0732) BP: 134/62 mmHg (12/27 0800) Pulse Rate: 69 (12/27 0800)  Labs:  Recent Labs  01/03/14 0347  01/03/14 2300 01/04/14 0810 01/05/14 0348  HGB 8.8*  --   --   --  8.2*  HCT 27.8*  --   --   --  25.3*  PLT 249  --   --   --  293  HEPARINUNFRC 0.80*  < > 0.32 0.56 0.39  CREATININE 0.72  --   --   --  0.72  < > = values in this interval not displayed.  Estimated Creatinine Clearance: 79.2 mL/min (by C-G formula based on Cr of 0.72).  Assessment: 63 yoM presents in respiratory distress with several day history of constant aching left sided chest pain. Patient continues on IV heparin for anticoagulation. Heparin level has been variable since initiation. HL now remains therapeutic at 0.39. H/H remains low with slight trend down, plt wnl and stable with no reported s/s bleeding. Planning cardiac cath on Monday.    Goal of Therapy:  Heparin level 0.3-0.7 units/ml Monitor platelets by anticoagulation protocol: Yes   Plan:  - Continue heparin gtt at 1850 u/hr  - Daily HL/CBC - F/u cath on Monday   Bart Ashford K. Velva Harman, PharmD Clinical Pharmacist - Resident Pager: [phone] Pharmacy: [phone] 01/05/2014 10:45 AM

## 2014-01-06 ENCOUNTER — Encounter (HOSPITAL_COMMUNITY): Payer: Self-pay | Admitting: Cardiology

## 2014-01-06 ENCOUNTER — Encounter (HOSPITAL_COMMUNITY)
Admission: EM | Disposition: A | Discharge status: Home Health | Payer: Self-pay | Source: Home / Self Care | Attending: Internal Medicine

## 2014-01-06 DIAGNOSIS — I2510 Atherosclerotic heart disease of native coronary artery without angina pectoris: Secondary | ICD-10-CM

## 2014-01-06 HISTORY — PX: LEFT HEART CATHETERIZATION WITH CORONARY ANGIOGRAM: SHX5451

## 2014-01-06 LAB — BASIC METABOLIC PANEL
Anion gap: 7 (ref 5–15)
BUN: <5 mg/dL — ABNORMAL LOW (ref 6–23)
CHLORIDE: 109 meq/L (ref 96–112)
CO2: 26 mmol/L (ref 19–32)
Calcium: 7.8 mg/dL — ABNORMAL LOW (ref 8.4–10.5)
Creatinine, Ser: 0.76 mg/dL (ref 0.50–1.35)
GFR calc Af Amer: >90 mL/min (ref >90)
GFR calc non Af Amer: 90 mL/min — ABNORMAL LOW (ref >90)
GLUCOSE: 89 mg/dL (ref 70–99)
POTASSIUM: 3.2 mmol/L — AB (ref 3.5–5.1)
Sodium: 142 mmol/L (ref 135–145)

## 2014-01-06 LAB — CBC
HCT: 26.3 % — ABNORMAL LOW (ref 39.0–52.0)
HEMOGLOBIN: 8.6 g/dL — AB (ref 13.0–17.0)
MCH: 29.4 pg (ref 26.0–34.0)
MCHC: 32.7 g/dL (ref 30.0–36.0)
MCV: 89.8 fL (ref 78.0–100.0)
Platelets: 315 10*3/uL (ref 150–400)
RBC: 2.93 MIL/uL — AB (ref 4.22–5.81)
RDW: 15.5 % (ref 11.5–15.5)
WBC: 8.5 10*3/uL (ref 4.0–10.5)

## 2014-01-06 LAB — HEPARIN LEVEL (UNFRACTIONATED): Heparin Unfractionated: 0.13 IU/mL — ABNORMAL LOW (ref 0.30–0.70)

## 2014-01-06 SURGERY — LEFT HEART CATHETERIZATION WITH CORONARY ANGIOGRAM
Anesthesia: LOCAL

## 2014-01-06 MED ORDER — IBUPROFEN 400 MG PO TABS
400.0000 mg | ORAL_TABLET | Freq: Four times a day (QID) | ORAL | Status: DC | PRN
  Administered 2014-01-06: 400 mg via ORAL
  Filled 2014-01-06 (×2): qty 1

## 2014-01-06 MED ORDER — HEPARIN SODIUM (PORCINE) 1000 UNIT/ML IJ SOLN
INTRAMUSCULAR | Status: AC
  Filled 2014-01-06: qty 1

## 2014-01-06 MED ORDER — VERAPAMIL HCL 2.5 MG/ML IV SOLN
INTRAVENOUS | Status: AC
  Filled 2014-01-06: qty 2

## 2014-01-06 MED ORDER — POTASSIUM CHLORIDE CRYS ER 20 MEQ PO TBCR
40.0000 meq | EXTENDED_RELEASE_TABLET | Freq: Once | ORAL | Status: AC
  Administered 2014-01-06: 40 meq via ORAL
  Filled 2014-01-06: qty 2

## 2014-01-06 MED ORDER — FENTANYL CITRATE 0.05 MG/ML IJ SOLN
INTRAMUSCULAR | Status: AC
  Filled 2014-01-06: qty 2

## 2014-01-06 MED ORDER — SODIUM CHLORIDE 0.9 % IJ SOLN
3.0000 mL | Freq: Two times a day (BID) | INTRAMUSCULAR | Status: DC
  Administered 2014-01-06: 3 mL via INTRAVENOUS

## 2014-01-06 MED ORDER — TRAMADOL HCL 50 MG PO TABS
50.0000 mg | ORAL_TABLET | Freq: Four times a day (QID) | ORAL | Status: DC | PRN
  Administered 2014-01-06 – 2014-01-08 (×5): 50 mg via ORAL
  Filled 2014-01-06 (×5): qty 1

## 2014-01-06 MED ORDER — IBUPROFEN 400 MG PO TABS
400.0000 mg | ORAL_TABLET | Freq: Four times a day (QID) | ORAL | Status: DC | PRN
  Administered 2014-01-07: 06:00:00 400 mg via ORAL
  Filled 2014-01-06 (×2): qty 1

## 2014-01-06 MED ORDER — HEPARIN (PORCINE) IN NACL 2-0.9 UNIT/ML-% IJ SOLN
INTRAMUSCULAR | Status: AC
  Filled 2014-01-06: qty 1000

## 2014-01-06 MED ORDER — POTASSIUM CHLORIDE CRYS ER 20 MEQ PO TBCR: 40.0000 meq | EXTENDED_RELEASE_TABLET | Freq: Once | ORAL | Status: DC

## 2014-01-06 MED ORDER — HEPARIN SODIUM (PORCINE) 5000 UNIT/ML IJ SOLN
5000.0000 [IU] | Freq: Three times a day (TID) | INTRAMUSCULAR | Status: DC
  Administered 2014-01-06 – 2014-01-08 (×5): 5000 [IU] via SUBCUTANEOUS
  Filled 2014-01-06 (×8): qty 1

## 2014-01-06 MED ORDER — CLONIDINE HCL 0.2 MG PO TABS
0.2000 mg | ORAL_TABLET | Freq: Three times a day (TID) | ORAL | Status: DC
  Administered 2014-01-06 – 2014-01-08 (×7): 0.2 mg via ORAL
  Filled 2014-01-06 (×8): qty 1

## 2014-01-06 MED ORDER — SODIUM CHLORIDE 0.9 % IV SOLN: 250.0000 mL | INTRAVENOUS | Status: DC | PRN

## 2014-01-06 MED ORDER — NITROGLYCERIN 1 MG/10 ML FOR IR/CATH LAB
INTRA_ARTERIAL | Status: AC
  Filled 2014-01-06: qty 10

## 2014-01-06 MED ORDER — MIDAZOLAM HCL 2 MG/2ML IJ SOLN
INTRAMUSCULAR | Status: AC
  Filled 2014-01-06: qty 2

## 2014-01-06 MED ORDER — LIDOCAINE HCL (PF) 1 % IJ SOLN
INTRAMUSCULAR | Status: AC
  Filled 2014-01-06: qty 30

## 2014-01-06 MED ORDER — SODIUM CHLORIDE 0.9 % IV SOLN: 1.0000 mL/kg/h | INTRAVENOUS | Status: AC

## 2014-01-06 MED ORDER — SODIUM CHLORIDE 0.9 % IJ SOLN: 3.0000 mL | INTRAMUSCULAR | Status: DC | PRN

## 2014-01-06 MED ORDER — SODIUM CHLORIDE 0.9 % IV SOLN: 1.0000 mL/kg/h | INTRAVENOUS | Status: DC

## 2014-01-06 MED ORDER — POTASSIUM CHLORIDE 20 MEQ/15ML (10%) PO SOLN
ORAL | Status: AC
  Filled 2014-01-06: qty 30

## 2014-01-06 NOTE — Progress Notes (Signed)
TR BAND REMOVAL  LOCATION:    right radial  DEFLATED PER PROTOCOL:    Yes.    TIME BAND OFF / DRESSING APPLIED:    1200   SITE UPON ARRIVAL:    Level 0  SITE AFTER BAND REMOVAL:    Level 0  REVERSE ALLEN'S TEST:     positive  CIRCULATION SENSATION AND MOVEMENT:    Within Normal Limits   Yes.    COMMENTS:   Tolerated the procedure well with no complaints.

## 2014-01-06 NOTE — Progress Notes (Signed)
DISCUSSED CODE STATUS WITH PATIENT AND PATIENT WISHES TO REMAIN A DNR. MENG PA INFORMED.

## 2014-01-06 NOTE — H&P (View-Only) (Signed)
Patient Name: Rick Church Date of Encounter: 01/05/2014    Principal Problem:   HCAP (healthcare-associated pneumonia) Active Problems:   Acute respiratory failure   COPD with emphysema   Demand ischemia   NSTEMI (non-ST elevated myocardial infarction)   Sepsis    SUBJECTIVE  No chest pain or dyspnea  CURRENT MEDS . atorvastatin  80 mg Oral q1800  . budesonide  0.25 mg Nebulization Q6H  . citalopram  10 mg Oral Daily  . clonazePAM  0.5 mg Oral BID  . clopidogrel  75 mg Oral Daily  . fentaNYL  50 mcg Transdermal Q72H  . ipratropium-albuterol  3 mL Nebulization Q6H  . levofloxacin  750 mg Oral Daily  . metoprolol tartrate  12.5 mg Oral BID WC  . pregabalin  75 mg Oral BID    OBJECTIVE  Filed Vitals:   01/05/14 0345 01/05/14 0600 01/05/14 0732 01/05/14 0800  BP: 138/51 130/61 146/67 134/62  Pulse: 71 63 65 69  Temp: 98.8 F (37.1 C)  99.4 F (37.4 C)   TempSrc: Oral  Axillary   Resp:   16   Height:      Weight: 167 lb 1.7 oz (75.8 kg)     SpO2: 94% 95% 95% 98%    Intake/Output Summary (Last 24 hours) at 01/05/14 1129 Last data filed at 01/05/14 0717  Gross per 24 hour  Intake 1041.5 ml  Output   2555 ml  Net -1513.5 ml   Filed Weights   01/03/14 0608 01/04/14 0449 01/05/14 0345  Weight: 164 lb 10.9 oz (74.7 kg) 164 lb 14.5 oz (74.8 kg) 167 lb 1.7 oz (75.8 kg)    PHYSICAL EXAM  General: Pleasant, NAD. Neuro: Alert and oriented X 3. Moves all extremities spontaneously. HEENT:  Normal  Neck: Supple  Lungs:  Diminished BS right Heart: RRR 2/6 systolic murmur apex Abdomen: Soft, non-tender, non-distended Extremities: 1+ edema.   Accessory Clinical Findings  CBC  Recent Labs  01/03/14 0347 01/05/14 0348  WBC 10.0 8.6  HGB 8.8* 8.2*  HCT 27.8* 25.3*  MCV 92.4 89.1  PLT 249 127   Basic Metabolic Panel  Recent Labs  01/03/14 0347 01/05/14 0348  NA 142 140  K 4.0 2.7*  CL 115* 110  CO2 21 24  GLUCOSE 93 89  BUN 12 <5*    CREATININE 0.72 0.72  CALCIUM 7.6* 7.7*   Liver Function Tests  Recent Labs  01/03/14 0347  AST 37  ALT 39  ALKPHOS 81  BILITOT 0.1*  PROT 4.6*  ALBUMIN 1.6*   TELE  Rsr, rare pvc's.   Radiology/Studies  Dg Chest 2 View  12/31/2013   CLINICAL DATA:  Shortness of breath.  EXAM: CHEST  2 VIEW  COMPARISON:  12/05/2013  FINDINGS: There is new patchy consolidation in the right lower lobe. Continued improvement in left perihilar opacity from prior. There is a diminutive right pleural effusion. The cardiomediastinal contours are unchanged. There is no pneumothorax. Stable osseous structures.  IMPRESSION: New patchy opacity right lung base. These may reflect pneumonia, consider aspiration given distribution. Continued improvement in left perihilar opacity.   Electronically Signed   By: Rick Church M.D.   On: 12/31/2013 15:48    ASSESSMENT AND PLAN  1.  HCAP:  Abx per IM.    2.  NSTEMI:  Continue Plavix (allergy to asa), heparin, statin and metoprolol. For catheterization on Monday as outlined by Dr. Ron Church.  3. Aortic stenosis-moderate almost recent echocardiogram. Will need to  be followed as an outpatient.   4. Acute diastolic CHF-add lasix 20 mg daily and follow renal function  5. Hypokalemia-supplement   Rick Ruths, MD, [facility] 01/05/2014 11:29 AM

## 2014-01-06 NOTE — Interval H&P Note (Signed)
History and Physical Interval Note:  01/06/2014 7:43 AM  Rick Church  has presented today for surgery, with the diagnosis of CP  The various methods of treatment have been discussed with the patient and family. After consideration of risks, benefits and other options for treatment, the patient has consented to  Procedure(s): LEFT HEART CATHETERIZATION WITH CORONARY ANGIOGRAM (N/A) as a surgical intervention .  The patient's history has been reviewed, patient examined, no change in status, stable for surgery.  I have reviewed the patient's chart and labs.  Questions were answered to the patient's satisfaction.   Cath Lab Visit (complete for each Cath Lab visit)  Clinical Evaluation Leading to the Procedure:   ACS: Yes.    Non-ACS:    Anginal Classification: CCS III  Anti-ischemic medical therapy: Minimal Therapy (1 class of medications)  Non-Invasive Test Results: No non-invasive testing performed  Prior CABG: No previous CABG        Rick Church [facility] 01/06/2014 7:43 AM

## 2014-01-06 NOTE — Evaluation (Signed)
Physical Therapy Evaluation Patient Details Name: Rick Church MRN: [id number] DOB: 1940/09/29 Today's Date: 01/06/2014   History of Present Illness  Pt is a 73 year old male with history of COPD, ARDS, legionella PNA, and HTN who presented with sepsis, HCAP, and NSTEMI. Pt noted to have been admitted 11/5-->12/7 for acute resp failure 2/2 CAP as well as ARDS s/p trach. Was D/Cd to SNF. Per pt was sent home 12/21 from SNF. Since going home had generalized malaise and weakness. Sxs progressed. Called EMS. Was noted to be SOB on eval w/ T 103.2.   Clinical Impression  Pt admitted with above diagnosis. Pt currently with functional limitations due to the deficits listed below (see PT Problem List). At the time of PT eval pt was able to ambulate around the room. Somewhat impulsive with OOB activity which increases risk for falls. Provided RW for pt to use instead of quad cane for increased support for the R knee. Pt adamant about returning home as soon as possible - appears very stressed while talking about his finances and being home to pay his bills on time. Pt will benefit from skilled PT to increase their independence and safety with mobility to allow discharge to the venue listed below.  Do not feel pt is safe to be at home alone at this time.      Follow Up Recommendations Home health PT;Supervision/Assistance - 24 hour    Equipment Recommendations  None recommended by PT    Recommendations for Other Services       Precautions / Restrictions Precautions Precautions: Fall Restrictions Weight Bearing Restrictions: No      Mobility  Bed Mobility Overal bed mobility: Modified Independent Bed Mobility: Supine to Sit           General bed mobility comments: No physical assist to transition to EOB. Used bed rails and required increased time. Pt guarding R knee.   Transfers Overall transfer level: Needs assistance Equipment used: Quad cane Transfers: Sit to/from Stand Sit to  Stand: Supervision         General transfer comment: Pt able to power-up to full standing position without assistance. Close supervision for safety as pt somewhat unsteady, however could recover independently.   Ambulation/Gait Ambulation/Gait assistance: Min guard Ambulation Distance (Feet): 25 Feet Assistive device: Quad cane Gait Pattern/deviations: Step-to pattern;Decreased stride length;Antalgic Gait velocity: decreased Gait velocity interpretation: Below normal speed for age/gender General Gait Details: Pt ambulated in room with quad cane. Was able to ambulate to the door and back without LOB, however gait pattern very antalgic due to guarding R knee. Feel that the RW is a safer option at this time and therapist provided one for pt to use while inpatient.   Stairs            Wheelchair Mobility    Modified Rankin (Stroke Patients Only)       Balance Overall balance assessment: Needs assistance Sitting-balance support: Feet supported;No upper extremity supported Sitting balance-Leahy Scale: Fair     Standing balance support: Single extremity supported Standing balance-Leahy Scale: Poor Standing balance comment: Requires UE support.                              Pertinent Vitals/Pain Pain Assessment: 0-10 Pain Score: 9  Pain Location: R knee Pain Intervention(s): Limited activity within patient's tolerance;Monitored during session    [place] expects to be discharged to:: Private residence Living Arrangements:  Alone Available Help at Discharge: Family;Available 24 hours/day (First few days) Type of Home: House Home Access: Stairs to enter Entrance Stairs-Rails: Right;Left;Can reach both Entrance Stairs-Number of Steps: 2 Home Layout: One level Home Equipment: Walker - 2 wheels;Cane - single point;Shower seat      Prior Function Level of Independence: Independent with assistive device(s)         Comments: Occasionally  used cane. Still drives but car is not drivable     Hand Dominance   Dominant Hand: Right    Extremity/Trunk Assessment   Upper Extremity Assessment: Defer to OT evaluation;Overall [facility] for tasks assessed           Lower Extremity Assessment: RLE deficits/detail RLE Deficits / Details: Increased pain in the knee, states it "gets bad" sometimes and he goes in to the ortho's office for a shot. Last shot was ~2 years ago per pt.     Cervical / Trunk Assessment: Normal  Communication   Communication: No difficulties  Cognition Arousal/Alertness: Awake/alert Behavior During Therapy: WFL for tasks assessed/performed Overall Cognitive Status: Within Functional Limits for tasks assessed                      General Comments      Exercises        Assessment/Plan    PT Assessment Patient needs continued PT services  PT Diagnosis Difficulty walking;Abnormality of gait;Acute pain   PT Problem List Decreased strength;Decreased range of motion;Decreased activity tolerance;Decreased balance;Decreased mobility;Decreased cognition;Cardiopulmonary status limiting activity;Pain  PT Treatment Interventions DME instruction;Gait training;Stair training;Functional mobility training;Therapeutic activities;Therapeutic exercise;Balance training;Patient/family education   PT Goals (Current goals can be found in the Care Plan section) Acute Rehab PT Goals Patient Stated Goal: Home today PT Goal Formulation: With patient Time For Goal Achievement: 01/20/14 Potential to Achieve Goals: Good    Frequency Min 3X/week   Barriers to discharge Decreased caregiver support Unsure how long pt will have 24 hour assist available.     Co-evaluation               End of Session Equipment Utilized During Treatment: Gait belt;Oxygen Activity Tolerance: Patient limited by pain Patient left: with call bell/phone within reach;with family/visitor present (Sitting EOB) Nurse Communication:  Mobility status         Time: 4008-6761 PT Time Calculation (min) (ACUTE ONLY): 20 min   Charges:   PT Evaluation $Initial PT Evaluation Tier I: 1 Procedure PT Treatments $Gait Training: 8-22 mins   PT G Codes:        Rolinda Roan 01-14-2014, 4:34 PM  Rolinda Roan, PT, DPT Acute Rehabilitation Services Pager: [phone]

## 2014-01-06 NOTE — Progress Notes (Signed)
[place] TEAM 1 - Stepdown/ICU TEAM Progress Note  KRIST ROSENBOOM ZOX:[id number] DOB: 1940-11-05 DOA: 12/31/2013 PCP: Antony Blackbird, MD  Admit HPI / Brief Narrative: 73 year old male with history of COPD, ARDS, legionella PNA, and HTN who presented with sepsis, HCAP, and NSTEMI. Pt noted to have been admitted 11/5-->12/7 for acute resp failure 2/2 CAP w/ + legionella Ag as well as ARDS s/p trach. Was D/Cd to SNF. Per pt was sent home 12/21 from SNF. Since going home had generalized malaise and weakness. Sxs progressed.  Called EMS. Was noted to be SOB on eval w/ T 103.2.   Presented to ER.  Blood pressure initially in the 90s. Satting in the low 90s on room air. White blood cell count 14.5, hemoglobin 9.5, potassium 3.3, creatinine 1.09. Troponin 5.89. Lactate 2.25. UA not indicative of infection. EKG with T-wave inversions in the lateral leads. Chest x-ray noted new opacity in the right lung base.  HPI/Subjective: Pt is resting comfortably post cardiac cath.  No cp, sob, n/v, or abdom pain.  He does c/o pain in his R knee, for which he occasionally requires a steroid joint injection.    Assessment/Plan:  Sepsis due to HCAP -Sepsis secondary to healthcare associated pneumonia - vancomycin and aztreonam transitioned to levaquin - plan to complete 10 days of tx   NSTEMI -Noted troponin peak of 5.89 with T-wave inversions in lateral leads -denies any prior history of cardiac disease -allergy to ASA -Cardiology following - s/p cardiac cath today noting nonobstructive CAD, diffuse ectasia/aneurysmal disease and normal LV function -to continue medical management with Plavix and metoprolol  Hypertension -poorly controlled - titrate meds and follow   COPD -well compensated at present   Anemia no s/s of bleeding; will monitor on IV heparin; Iron panel consistent with AOCD  Hypokalemia Replace and follow - check Mg   Code Status: FULL Family Communication: no family present at time of  exam Disposition Plan: begin PT/OT - possible d/c home next 48hrs   Consultants: PCCM Cardiology   Procedures: TTE - 12/23 - EF 55-60% - mild LVH - no WMA - grade 1 DD   Antibiotics: Aztreonam 12/22 > 12/25 Cefepime 12/22 Vanc 12/22 > 12/25 Levaquin 12/25 >  DVT prophylaxis: SQ heparin   Objective: Blood pressure 145/64, pulse 66, temperature 99.3 F (37.4 C), temperature source Oral, resp. rate 18, height 5\' 7"  (1.702 m), weight 74.3 kg (163 lb 12.8 oz), SpO2 92 %.  Intake/Output Summary (Last 24 hours) at 01/06/14 1432 Last data filed at 01/06/14 1100  Gross per 24 hour  Intake    447 ml  Output   1700 ml  Net  -1253 ml   Exam: General: No acute respiratory distress at rest  Lungs: CTA th/o w/o wheeze  Cardiovascular: Regular rate and rhythm - 2/6 holosystolic M Abdomen: Nontender, nondistended, soft, bowel sounds positive, no rebound, no ascites, no appreciable mass Extremities: No significant cyanosis, clubbing, or edema bilateral lower extremities - R knee w/o erythema or apparent effusion   Data Reviewed: Basic Metabolic Panel:  Recent Labs Lab 01/01/14 0142 01/02/14 0350 01/03/14 0347 01/05/14 0348 01/05/14 1719 01/06/14 0544  NA 140 139 142 140  --  142  K 3.8 3.1* 4.0 2.7* 3.3* 3.2*  CL 107 111 115* 110  --  109  CO2 28 24 21 24   --  26  GLUCOSE 94 96 93 89  --  89  BUN 17 19 12  <5*  --  <5*  CREATININE 0.89 0.72 0.72 0.72  --  0.76  CALCIUM 7.0* 7.0* 7.6* 7.7*  --  7.8*    Liver Function Tests:  Recent Labs Lab 12/31/13 1445 12/31/13 1959 01/01/14 0142 01/02/14 0350 01/03/14 0347  AST 37 34 37 27 37  ALT 20 19 20 23  39  ALKPHOS 80 74 77 78 81  BILITOT 0.4 0.5 0.7 0.2* 0.1*  PROT 4.9* 4.7* 5.0* 4.3* 4.6*  ALBUMIN 2.1* 1.9* 1.8* 1.6* 1.6*   Coags:  Recent Labs Lab 12/31/13 1959  INR 1.45    Recent Labs Lab 12/31/13 1803  APTT 36    CBC:  Recent Labs Lab 12/31/13 1445  01/01/14 0142 01/02/14 0350 01/03/14 0347  01/05/14 0348 01/06/14 0544  WBC 14.5*  < > 15.0* 11.7* 10.0 8.6 8.5  NEUTROABS 11.1*  --  13.5*  --   --   --   --   HGB 9.5*  < > 9.5* 8.9* 8.8* 8.2* 8.6*  HCT 30.2*  < > 29.9* 27.9* 27.8* 25.3* 26.3*  MCV 91.8  < > 92.0 91.8 92.4 89.1 89.8  PLT 235  < > 224 223 249 293 315  < > = values in this interval not displayed.  Cardiac Enzymes:  Recent Labs Lab 12/31/13 1616 12/31/13 1959 01/01/14 0142 01/01/14 0754  TROPONINI 5.89* 5.03* 4.98* 5.31*   BNP (last 3 results)  Recent Labs  11/17/13 0400 11/24/13 0245  PROBNP 1388.0* 534.8*   Studies:  Recent x-ray studies have been reviewed in detail by the Attending Physician  Scheduled Meds:  Scheduled Meds: . atorvastatin  80 mg Oral q1800  . budesonide  0.25 mg Nebulization Q6H  . citalopram  10 mg Oral Daily  . clonazePAM  0.5 mg Oral BID  . clopidogrel  75 mg Oral Daily  . fentaNYL  50 mcg Transdermal Q72H  . furosemide  20 mg Oral Daily  . heparin  5,000 Units Subcutaneous 3 times per day  . ipratropium-albuterol  3 mL Nebulization Q6H  . levofloxacin  750 mg Oral Daily  . metoprolol tartrate  12.5 mg Oral BID WC  . pregabalin  75 mg Oral BID    Time spent on care of this patient: 25 mins   [facility] T , MD   [facility] Office  [phone] Pager - Text Page per Shea Evans as per below:  On-Call/Text Page:      [email or web address]      password [email or web address]  If 7PM-7AM, please contact night-coverage [email or web address] Password [email or web address] 01/06/2014, 2:32 PM   LOS: 6 days

## 2014-01-06 NOTE — CV Procedure (Signed)
    Cardiac Catheterization Procedure Note  Name: Rick Church MRN: [id number] DOB: [birth year]  Procedure: Left Heart Cath, Selective Coronary Angiography, LV angiography  Indication: 73 yo WM with multiple cardiac risk factors. He presents with PNA associated with chest pain and positive cardiac markers.    Procedural Details: The right wrist was prepped, draped, and anesthetized with 1% lidocaine. Using the modified Seldinger technique, a 6 French slender sheath was introduced into the right radial artery. 3 mg of verapamil was administered through the sheath, weight-based unfractionated heparin was administered intravenously. Standard Judkins catheters were used for selective coronary angiography and left ventriculography. An EZRAD right catheter was used to engage the RCA.Catheter exchanges were performed over an exchange length guidewire. There were no immediate procedural complications. A TR band was used for radial hemostasis at the completion of the procedure.  The patient was transferred to the post catheterization recovery area for further monitoring.  Procedural Findings: Hemodynamics: AO 156/65 mean 100 mm Hg LV 164/19 mm Hg  Coronary angiography: Coronary dominance: right  Left mainstem: 20% ostial disease. The left main is large.   Left anterior descending (LAD): There is diffuse ectasia in the proximal LAD. The mid LAD has a focal 50% stenosis. The first and second diagonals are without significant disease.   Left circumflex (LCx): The LCx gives rise to a large OM1 then continues in the AV groove to give off a terminal OM. 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 No obstructive disease.   Right coronary artery (RCA): The RCA is moderately calcified. 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 The PDA has 50% ostial stenosis.   Left ventriculography: Left ventricular systolic function is normal, LVEF is estimated at 55%, there is no significant  mitral regurgitation   Final Conclusions:   1. Nonobstructive CAD. Diffuse ectasia/aneurysmal disease. 2. Normal LV function. 3. Mild aortic stenosis with gradient less than 10 mm Hg by pullback.   Recommendations: Continue medical management with Plavix and metoprolol. Risk factor modification.  Peter Martinique, [place]  01/06/2014, 8:20 AM

## 2014-01-07 DIAGNOSIS — E876 Hypokalemia: Secondary | ICD-10-CM

## 2014-01-07 DIAGNOSIS — A4189 Other specified sepsis: Secondary | ICD-10-CM

## 2014-01-07 LAB — CULTURE, BLOOD (ROUTINE X 2)
Culture: NO GROWTH
Culture: NO GROWTH

## 2014-01-07 LAB — LEGIONELLA ANTIGEN, URINE

## 2014-01-07 LAB — BASIC METABOLIC PANEL
Anion gap: 7 (ref 5–15)
BUN: 5 mg/dL — AB (ref 6–23)
CALCIUM: 7.7 mg/dL — AB (ref 8.4–10.5)
CHLORIDE: 104 meq/L (ref 96–112)
CO2: 27 mmol/L (ref 19–32)
CREATININE: 0.74 mg/dL (ref 0.50–1.35)
GFR calc Af Amer: >90 mL/min (ref >90)
GFR calc non Af Amer: >90 mL/min (ref >90)
Glucose, Bld: 132 mg/dL — ABNORMAL HIGH (ref 70–99)
Potassium: 3.6 mmol/L (ref 3.5–5.1)
Sodium: 138 mmol/L (ref 135–145)

## 2014-01-07 LAB — MAGNESIUM: Magnesium: 1.2 mg/dL — ABNORMAL LOW (ref 1.5–2.5)

## 2014-01-07 MED ORDER — POTASSIUM CHLORIDE CRYS ER 20 MEQ PO TBCR
20.0000 meq | EXTENDED_RELEASE_TABLET | Freq: Every day | ORAL | Status: DC
  Administered 2014-01-07 – 2014-01-08 (×2): 20 meq via ORAL
  Filled 2014-01-07 (×2): qty 1

## 2014-01-07 NOTE — Care Management Note (Addendum)
  Page 2 of 2   01/07/2014     1:47:59 PM CARE MANAGEMENT NOTE 01/07/2014  Patient:  Rick Church, Rick Church   Account Number:  [id number]  Date Initiated:  01/01/2014  Documentation initiated by:  MAYO,HENRIETTA  Subjective/Objective Assessment:   dx sepsis/PNA/NSTEMI; residing @ [facility]    PCP  [name] Fulp     Action/Plan:   Anticipated DC Date:  01/06/2014   Anticipated DC Plan:  [place]  In-house referral  Clinical Social Worker      DC Forensic scientist  CM consult      [facility] Choice  HOME HEALTH   Choice offered to / List presented to:  [name]   DME arranged  Vassie Moselle      DME agency  [place] arranged  HH-1 RN  [place] PT      [place].   Status of service:  Completed, signed off Medicare Important Message given?  YES (If response is "NO", the following Medicare IM given date fields will be blank) Date Medicare IM given:  01/06/2014 Medicare IM given by:  Eldrick Penick Date Additional Medicare IM given:   Additional Medicare IM given by:    Discharge Disposition:  [place]  Per UR Regulation:  Reviewed for med. necessity/level of care/duration of stay  If discussed at [place] of Stay Meetings, dates discussed:   01/07/2014    Comments:  Mariann Laster RN, BSN, [place], CCM  Nurse - Case Manager,  ([facility])  [phone]   01/07/2014 SW referral re:  patient concerns on paying her bills. Hx/o knee pain requiring RW for assistance while IP. Disposition Plan:  Home with HHS:  RN, PT. ([name] notified) DME:  RW,  oxygen  - ([name]/[facility] notified)

## 2014-01-07 NOTE — Progress Notes (Signed)
UR completed Rick Kimberlin K. Jhene Westmoreland, RN, BSN, MSHL, CCM  01/07/2014 1:42 PM

## 2014-01-07 NOTE — Evaluation (Signed)
Occupational Therapy Evaluation Patient Details Name: Rick Church MRN: [id number] DOB: [birth year] Today's Date: 01/07/2014    History of Present Illness Pt is a 73 year old male with history of COPD, ARDS, legionella PNA, and HTN who presented with sepsis, HCAP, and NSTEMI. Pt noted to have been admitted 11/5-->12/7 for acute resp failure 2/2 CAP as well as ARDS s/p trach. Was D/Cd to SNF. Per pt was sent home 12/21 from SNF. Since going home had generalized malaise and weakness. Sxs progressed. Called EMS. Was noted to be SOB on eval w/ T 103.2.    Clinical Impression   Pt was in ST rehab at [facility] prior to admission.  Presents today with painful R knee with impaired balance and decreased activity tolerance.  Pt is very focused on going home and somewhat resistant to education in safety, use of rollator, and energy conservation strategies. Pt is adamant he wants to return home and states he has family who will be staying with him. Overall, he is functioning at a supervision to min guard level.  He maintained 02 sats in the low 90s on 4L with activity. Will follow acutely.  Recommending HHOT to address home safety, IADL and further instruct in energy conservation.   Follow Up Recommendations  Home health OT;Supervision/Assistance - 24 hour    Equipment Recommendations  None recommended by OT    Recommendations for Other Services       Precautions / Restrictions Precautions Precautions: Fall Precaution Comments: watch sats Restrictions Weight Bearing Restrictions: No      Mobility Bed Mobility Overal bed mobility: Modified Independent             General bed mobility comments: guarding R knee, no physical assist  Transfers Overall transfer level: Needs assistance Equipment used: 4-wheeled walker   Sit to Stand: Supervision              Balance                                            ADL Overall ADL's : Needs  assistance/impaired Eating/Feeding: Independent;Sitting   Grooming: Wash/dry hands;Supervision/safety;Standing   Upper Body Bathing: Set up;Sitting   Lower Body Bathing: Supervison/ safety;Sit to/from stand   Upper Body Dressing : Set up;Sitting   Lower Body Dressing: Supervision/safety;Sit to/from stand   Toilet Transfer: Min guard;Ambulation Toilet Transfer Details (indicate cue type and reason): simulated to chair         Functional mobility during ADLs: Min guard;Rolling walker;Cueing for safety (used rollator) General ADL Comments: Instructed pt in use of brakes on rollator, transporting items with rollator, and use of seat for energy conservation.  Instructed in pacing and purse lip breathing techiques.     Vision                     Perception     Praxis      Pertinent Vitals/Pain Pain Assessment: Faces Faces Pain Scale: Hurts whole lot Pain Location: R knee Pain Descriptors / Indicators: Aching Pain Intervention(s): Limited activity within patient's tolerance;Monitored during session;RN gave pain meds during session;Repositioned     Hand Dominance Right   Extremity/Trunk Assessment Upper Extremity Assessment Upper Extremity Assessment: Overall WFL for tasks assessed (amputated distal L first finger)   Lower Extremity Assessment Lower Extremity Assessment: Defer to PT evaluation RLE Deficits / Details: Increased  pain in the knee, states it "gets bad" sometimes and he goes in to the ortho's office for a shot. Last shot was ~2 years ago per pt.    Cervical / Trunk Assessment Cervical / Trunk Assessment: Normal   Communication Communication Communication: No difficulties   Cognition Arousal/Alertness: Awake/alert Behavior During Therapy: Agitated Overall Cognitive Status: Within Functional Limits for tasks assessed                 General Comments: Pt focused on going home and R knee pain. Difficult to assess cognition.   General  Comments       Exercises       Shoulder Instructions      Home Living Family/patient expects to be discharged to:: Private residence Living Arrangements: Other relatives (pt states his sister in law and brother are in the home) Available Help at Discharge: Family;Available 24 hours/day (short term) Type of Home: House Home Access: Stairs to enter [employer] of Steps: 2 Entrance Stairs-Rails: Right;Left;Can reach both Home Layout: One level               Home Equipment: Walker - 2 wheels;Cane - single point;Shower seat   Additional Comments: rollator has been delivered to room      Prior Functioning/Environment Level of Independence: Independent with assistive device(s)        Comments: Occasionally used cane. Still drives but car is not drivable    OT Diagnosis: Generalized weakness;Cognitive deficits;Acute pain   OT Problem List: Decreased activity tolerance;Impaired balance (sitting and/or standing);Pain;Decreased knowledge of use of DME or AE;Cardiopulmonary status limiting activity;Decreased safety awareness   OT Treatment/Interventions: Self-care/ADL training;Energy conservation;DME and/or AE instruction;Therapeutic activities;Patient/family education    OT Goals(Current goals can be found in the care plan section) Acute Rehab OT Goals Patient Stated Goal: Home today OT Goal Formulation: With patient Time For Goal Achievement: 01/14/14 Potential to Achieve Goals: Good ADL Goals Pt Will Perform Grooming: with modified independence;sitting;standing (will initiate use of rollator seat at sink as needed ) Pt Will Perform Lower Body Bathing: with modified independence;sit to/from stand Pt Will Perform Lower Body Dressing: with modified independence;sit to/from stand Pt Will Transfer to Toilet: with modified independence;ambulating;bedside commode (over toilet) Pt Will Perform Toileting - Clothing Manipulation and hygiene: with modified independence;sit  to/from stand Additional ADL Goal #1: Pt will generalize energy conservation strategies in ADL with min verbal cues.  OT Frequency: Min 2X/week   Barriers to D/C:            Co-evaluation              End of Session    Activity Tolerance: Patient limited by pain Patient left: in bed;with call bell/phone within reach (edge of bed)   Time: 3825-0539 OT Time Calculation (min): 21 min Charges:  OT General Charges $OT Visit: 1 Procedure OT Evaluation $Initial OT Evaluation Tier I: 1 Procedure OT Treatments $Self Care/Home Management : 8-22 mins G-Codes:    [name] 01/07/2014, 4:06 PM [phone]

## 2014-01-07 NOTE — Significant Event (Signed)
SATURATION QUALIFICATIONS: (This note is used to comply with regulatory documentation for home oxygen)  Patient Saturations on Room Air at Rest =88  Patient Saturations on Room Air while Ambulating = 86  Patient Saturations on 4 Liters of oxygen while Ambulating = 95  Please briefly explain why patient needs home oxygen: C/O sob on exertion/walking

## 2014-01-07 NOTE — Progress Notes (Signed)
TEAM 1 - Stepdown/ICU TEAM Progress Note  Rick Church KGM:[id number] DOB: 01-Jun-1940 DOA: 12/31/2013 PCP: Antony Blackbird, MD  Admit HPI / Brief Narrative: 73 year old WM PMHx COPD, ARDS, legionella PNA, and HTN who presented with sepsis, HCAP, and NSTEMI. Pt noted to have been admitted 11/5-->12/7 for acute resp failure 2/2 CAP w/ + legionella Ag as well as ARDS s/p trach. Was D/Cd to SNF. Per pt was sent home 12/21 from SNF. Since going home had generalized malaise and weakness. Sxs progressed. Called EMS. Was noted to be SOB on eval w/ T 103.2.   Presented to ER. Blood pressure initially in the 90s. SPO2 in the low 90s on room air. White blood cell count 14.5, hemoglobin 9.5, potassium 3.3, creatinine 1.09. Troponin 5.89. Lactate 2.25. UA not indicative of infection. EKG with T-wave inversions in the lateral leads. Chest x-ray noted new opacity in the right lung base.  HPI/Subjective: 12/29 A/O 4, states has smoked 1 PPD 40 years, not on home O2, negative CP, positive SOB but improved from admission  Assessment/Plan: Sepsis due to HCAP -Sepsis secondary to healthcare associated pneumonia - vancomycin and aztreonam transitioned to levaquin - plan to complete 10 days of tx  -Ambulatory SPO2 in the a.m.  NSTEMI -Noted troponin peak of 5.89 with T-wave inversions in lateral leads -denies any prior history of cardiac disease -allergy to ASA -Cardiology following - s/p cardiac cath today noting nonobstructive CAD, diffuse ectasia/aneurysmal disease and normal LV function -to continue medical management with Plavix and metoprolol -Stable from cardiac standpoint upon discharge patient to follow-up with Dr Ron Parker. In 3-4 weeks  Hypertension -In the setting of sepsis, and patient's age will allow some permissive HTN  -Continue Catapres 0.2 mg  TID -Lasix 20 mg daily    COPD -well compensated at present   Anemia -no s/s of bleeding; will monitor on IV heparin; Iron panel  consistent with AOCD  Hypokalemia -Replace and follow - check Mg    Code Status: FULL Family Communication: no family present at time of exam Disposition Plan: Next 24-48 hours    Consultants: Dr. Sanda Klein (cardiology)  Procedure/Significant Events: 12/23 echocardiogram;LVEF= 55%- 60%  - Left ventricle:  mild LVH. Systolic function was normal.-(grade 1 diastolic dysfunction). - Aortic valve: moderate stenosis.  - Aortic root: The aortic root was mildly dilated. - Left atrium: The atrium was mildly dilated. - Pulmonary arteries:  PApeak pressure: 32 mm Hg (S).   Culture   Antibiotics: Aztreonam 12/22 > stopped12/25 Cefepime 12/22>> stopped 12/22 Vanc 12/22 > stopped12/25 Levaquin 12/25 >  DVT prophylaxis: SQ heparin    Devices    LINES / TUBES:      Continuous Infusions:   Objective: VITAL SIGNS: Temp: 100.8 F (38.2 C) (12/29 0515) Temp Source: Oral (12/29 0515) BP: 103/28 mmHg (12/29 0515) Pulse Rate: 70 (12/29 0515) SPO2; FIO2:   Intake/Output Summary (Last 24 hours) at 01/07/14 6440 Last data filed at 01/07/14 0518  Gross per 24 hour  Intake 1563.07 ml  Output   2151 ml  Net -587.93 ml     Exam: General: A/O 4, NAD, mild acute respiratory distress Lungs: Diffuse expiratory wheeze, negative  crackles Cardiovascular: Regular rate and rhythm without murmur gallop or rub normal S1 and S2 Abdomen: Nontender, nondistended, soft, bowel sounds positive, no rebound, no ascites, no appreciable mass Extremities: No significant cyanosis, clubbing, or edema bilateral lower extremities  Data Reviewed: Basic Metabolic Panel:  Recent Labs Lab 01/02/14 0350 01/03/14  8841 01/05/14 0348 01/05/14 1719 01/06/14 0544 01/07/14 0415  NA 139 142 140  --  142 138  K 3.1* 4.0 2.7* 3.3* 3.2* 3.6  CL 111 115* 110  --  109 104  CO2 24 21 24   --  26 27  GLUCOSE 96 93 89  --  89 132*  BUN 19 12 <5*  --  <5* 5*  CREATININE 0.72 0.72 0.72  --   0.76 0.74  CALCIUM 7.0* 7.6* 7.7*  --  7.8* 7.7*  MG  --   --   --   --   --  1.2*   Liver Function Tests:  Recent Labs Lab 12/31/13 1445 12/31/13 1959 01/01/14 0142 01/02/14 0350 01/03/14 0347  AST 37 34 37 27 37  ALT 20 19 20 23  39  ALKPHOS 80 74 77 78 81  BILITOT 0.4 0.5 0.7 0.2* 0.1*  PROT 4.9* 4.7* 5.0* 4.3* 4.6*  ALBUMIN 2.1* 1.9* 1.8* 1.6* 1.6*   No results for input(s): LIPASE, AMYLASE in the last 168 hours. No results for input(s): AMMONIA in the last 168 hours. CBC:  Recent Labs Lab 12/31/13 1445  01/01/14 0142 01/02/14 0350 01/03/14 0347 01/05/14 0348 01/06/14 0544  WBC 14.5*  < > 15.0* 11.7* 10.0 8.6 8.5  NEUTROABS 11.1*  --  13.5*  --   --   --   --   HGB 9.5*  < > 9.5* 8.9* 8.8* 8.2* 8.6*  HCT 30.2*  < > 29.9* 27.9* 27.8* 25.3* 26.3*  MCV 91.8  < > 92.0 91.8 92.4 89.1 89.8  PLT 235  < > 224 223 249 293 315  < > = values in this interval not displayed. Cardiac Enzymes:  Recent Labs Lab 12/31/13 1616 12/31/13 1959 01/01/14 0142 01/01/14 0754  TROPONINI 5.89* 5.03* 4.98* 5.31*   BNP (last 3 results)  Recent Labs  11/17/13 0400 11/24/13 0245  PROBNP 1388.0* 534.8*   CBG: No results for input(s): GLUCAP in the last 168 hours.  Recent Results (from the past 240 hour(s))  Blood Culture (routine x 2)     Status: None (Preliminary result)   Collection Time: 12/31/13  5:05 PM  Result Value Ref Range Status   Specimen Description BLOOD ARM RIGHT  Final   Special Requests BOTTLES DRAWN AEROBIC ONLY 5CC  Final   Culture  Setup Time   Final    01/01/2014 01:25 Performed at [facility]    Culture   Final           BLOOD CULTURE RECEIVED NO GROWTH TO DATE CULTURE WILL BE HELD FOR 5 DAYS BEFORE ISSUING A FINAL NEGATIVE REPORT Performed at [facility]    Report Status PENDING  Incomplete  Urine culture     Status: None   Collection Time: 12/31/13  6:10 PM  Result Value Ref Range Status   Specimen Description URINE, RANDOM   Final   Special Requests NONE  Final   Culture  Setup Time   Final    01/01/2014 00:45 Performed at [place] Performed at [facility]   Final   Culture NO GROWTH Performed at [facility]   Final   Report Status 01/01/2014 FINAL  Final  Blood Culture (routine x 2)     Status: None (Preliminary result)   Collection Time: 12/31/13  6:31 PM  Result Value Ref Range Status   Specimen Description BLOOD ARM RIGHT  Final  Special Requests BOTTLES DRAWN AEROBIC AND ANAEROBIC [facility]  Final   Culture  Setup Time   Final    01/01/2014 01:25 Performed at [facility]    Culture   Final           BLOOD CULTURE RECEIVED NO GROWTH TO DATE CULTURE WILL BE HELD FOR 5 DAYS BEFORE ISSUING A FINAL NEGATIVE REPORT Performed at [facility]    Report Status PENDING  Incomplete     Studies:  Recent x-ray studies have been reviewed in detail by the Attending Physician  Scheduled Meds:  Scheduled Meds: . atorvastatin  80 mg Oral q1800  . budesonide  0.25 mg Nebulization Q6H  . citalopram  10 mg Oral Daily  . clonazePAM  0.5 mg Oral BID  . cloNIDine  0.2 mg Oral TID  . clopidogrel  75 mg Oral Daily  . fentaNYL  50 mcg Transdermal Q72H  . furosemide  20 mg Oral Daily  . heparin  5,000 Units Subcutaneous 3 times per day  . ipratropium-albuterol  3 mL Nebulization Q6H  . levofloxacin  750 mg Oral Daily  . metoprolol tartrate  12.5 mg Oral BID WC  . pregabalin  75 mg Oral BID    Time spent on care of this patient: 40 mins   Allie Bossier , MD   [facility] Office  [phone] Pager - [phone]  On-Call/Text Page:      [email or web address]      password [email or web address]  If 7PM-7AM, please contact night-coverage [email or web address] Password [email or web address] 01/07/2014, 6:23 AM   LOS: 7 days
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
## 2014-01-08 MED ORDER — CLOPIDOGREL BISULFATE 75 MG PO TABS: 75.0000 mg | ORAL_TABLET | Freq: Every day | ORAL | Status: DC

## 2014-01-08 MED ORDER — MAGNESIUM SULFATE 2 GM/50ML IV SOLN
2.0000 g | Freq: Once | INTRAVENOUS | Status: AC
  Administered 2014-01-08: 2 g via INTRAVENOUS
  Filled 2014-01-08: qty 50

## 2014-01-08 MED ORDER — LEVOFLOXACIN 750 MG PO TABS: 750.0000 mg | ORAL_TABLET | Freq: Every day | ORAL | Status: DC

## 2014-01-08 MED ORDER — ATORVASTATIN CALCIUM 80 MG PO TABS: 80.0000 mg | ORAL_TABLET | Freq: Every day | ORAL | Status: DC

## 2014-01-08 MED ORDER — CLONAZEPAM 0.5 MG PO TABS: 0.5000 mg | ORAL_TABLET | Freq: Two times a day (BID) | ORAL | Status: DC

## 2014-01-08 MED ORDER — METOPROLOL TARTRATE 12.5 MG HALF TABLET: 12.5000 mg | ORAL_TABLET | Freq: Two times a day (BID) | ORAL | Status: DC

## 2014-01-08 MED ORDER — MAGNESIUM SULFATE 2 GM/50ML IV SOLN: 2.0000 g | Freq: Once | INTRAVENOUS | Status: DC

## 2014-01-08 NOTE — Progress Notes (Deleted)
Physical Therapy Treatment Patient Details Name: Rick Church MRN: [id number] DOB: [birth year] Today's Date: 01/08/2014    History of Present Illness Pt is a 73 year old male with history of COPD, ARDS, legionella PNA, and HTN who presented with sepsis, HCAP, and NSTEMI. Pt noted to have been admitted 11/5-->12/7 for acute resp failure 2/2 CAP as well as ARDS s/p trach. Was D/Cd to SNF. Per pt was sent home 12/21 from SNF. Since going home had generalized malaise and weakness. Sxs progressed. Called EMS. Was noted to be SOB on eval w/ T 103.2.     PT Comments    Pt at min guard to supervision with mobility and use of RW. Cues for RW safety. Pt demo good ability to ambulate with RW. Pt is agreeable to ambulate with RW upon D/C. Will cont to follow per POC.   Follow Up Recommendations  Home health PT;Supervision/Assistance - 24 hour     Equipment Recommendations  Rolling walker with 5" wheels    Recommendations for Other Services       Precautions / Restrictions Precautions Precautions: Fall Restrictions Weight Bearing Restrictions: No    Mobility  Bed Mobility               General bed mobility comments: up in chair  Transfers Overall transfer level: Needs assistance Equipment used: Rolling walker (2 wheeled) Transfers: Sit to/from Stand Sit to Stand: Supervision         General transfer comment: cues for hand placement and safety with RW  Ambulation/Gait Ambulation/Gait assistance: Min guard Ambulation Distance (Feet): 200 Feet Assistive device: Rolling walker (2 wheeled) Gait Pattern/deviations: Step-through pattern;Festinating;Decreased stride length;Shuffle Gait velocity: increased cadence due to parkinsonian gt   General Gait Details: cues for RW sequencing; pt at min guard to supervision for mobility with cues for safety   Stairs            Wheelchair Mobility    Modified Rankin (Stroke Patients Only)       Balance Overall balance  assessment: Needs assistance;History of Falls Sitting-balance support: Feet supported;No upper extremity supported Sitting balance-Leahy Scale: Fair     Standing balance support: During functional activity;Bilateral upper extremity supported Standing balance-Leahy Scale: Poor Standing balance comment: RW for bil UE support                    Cognition Arousal/Alertness: Awake/alert Behavior During Therapy: Flat affect Overall Cognitive Status: Within Functional Limits for tasks assessed                      Exercises      General Comments General comments (skin integrity, edema, etc.): VSS throughout session      Pertinent Vitals/Pain Pain Assessment: 0-10 Pain Score: 4  Pain Location: "my knees" Pain Descriptors / Indicators: Aching Pain Intervention(s): Monitored during session;Repositioned    Home Living                      Prior Function            PT Goals (current goals can now be found in the care plan section) Acute Rehab PT Goals Patient Stated Goal: to get moving  PT Goal Formulation: With patient Time For Goal Achievement: 01/20/14 Potential to Achieve Goals: Good Progress towards PT goals: Progressing toward goals    Frequency  Min 3X/week    PT Plan Current plan remains appropriate    Co-evaluation  End of Session Equipment Utilized During Treatment: Gait belt Activity Tolerance: Patient tolerated treatment well Patient left: Other (comment) (in bathroom; call bell in reach; nurse tech aware)     Time: [phone] PT Time Calculation (min) (ACUTE ONLY): 14 min  Charges:  $Gait Training: 8-22 mins                    G Codes:      Elie Confer [place], [place] 01/08/2014, 10:28 AM

## 2014-01-08 NOTE — Discharge Summary (Signed)
DISCHARGE SUMMARY  Rick Church  MR#: [id number]  DOB:06/28/1940  Date of Admission: 12/31/2013 Date of Discharge: 01/08/2014  Attending Physician:Lyrique Hakim T  Patient's YOM:Rick Church, [name], MD  Consults:  [facility]   Disposition: d/c home w/ PT/RN, and home O2  Follow-up Appts:     Follow-up Information    Follow up with [facility].   Why:  Oxygen and Rolling walker to room prior to discharge home   Contact information:   [address] [place] [address] [phone]       Follow up with [facility].   Why:  Registered Nurse and Physical Therapy Services to start within 24-48 hours of discharge   Contact information:   [address] [place] [address] [phone]       Follow up with Rick Argyle, MD On 02/10/2014.   Specialty:  Cardiology   Why:  2:15pm. Cardiology followup   Contact information:   [address] [place] [address] [phone]       Follow up with FULP, [name], MD. Schedule an appointment as soon as possible for a visit in 1 week.   Specialty:  Family Medicine   Contact information:   [place] [address] [address] [phone]      Tests Needing Follow-up: -determine in f/u if able to liberate from O2 as continues to recover -f/u BP control -recheck Hgb/anemia  -recheck K+ and Mg levels  Discharge Diagnoses: Sepsis due to HCAP NSTEMI Hypertension COPD Anemia Hypokalemia  Initial presentation: 73 year old male with history of COPD, ARDS, legionella PNA, and HTN who presented with sepsis, HCAP, and NSTEMI. Pt noted to have been admitted 11/5-->12/7 for acute resp failure 2/2 CAP w/ + legionella Ag as well as ARDS s/p trach. Was D/Cd to SNF. Per pt was sent home 12/21 from SNF. Since going home had generalized malaise and weakness. Sxs progressed. Called EMS. Was noted to be SOB on eval w/ T 103.2.   Presented to ER. Blood  pressure initially in the 90s. Satting in the low 90s on room air. White blood cell count 14.5, hemoglobin 9.5, potassium 3.3, creatinine 1.09. Troponin 5.89. Lactate 2.25. UA not indicative of infection. EKG with T-wave inversions in the lateral leads. Chest x-ray noted new opacity in the right lung base.  Hospital Course:  Sepsis due to HCAP -Sepsis secondary to healthcare associated pneumonia - vancomycin and aztreonam transitioned to levaquin - plan to complete 10 days of tx - clinically much improved but still requires O2 support - arrangements made for home O2 at all times - determine in f/u if able to liberate from O2 as continues to recover  NSTEMI -Noted troponin peak of 5.89 with T-wave inversions in lateral leads -denies any prior history of cardiac disease -allergy to ASA -Cardiology following - s/p cardiac cath 12/28 noting nonobstructive CAD, diffuse ectasia/aneurysmal disease and normal LV function -to continue medical management with Plavix and metoprolol -followup with Dr. Ron Church in 3-4 weeks  Hypertension -control improved at time of d/c - cont to follow as outpt   COPD -well compensated at time of d/c    Anemia no s/s of bleeding; Iron panel consistent with AOCD  Hypokalemia Replaced to 3.6 at time of d/c - Mg 1.2 so loaded w/ IV prior to d/c    Medication List    TAKE these medications        atorvastatin 80 MG  tablet  Commonly known as:  LIPITOR  Take 1 tablet (80 mg total) by mouth daily.     budesonide 0.5 MG/2ML nebulizer solution  Commonly known as:  PULMICORT  Take 2 mLs (0.5 mg total) by nebulization 2 (two) times daily.     citalopram 10 MG tablet  Commonly known as:  CELEXA  Take 1 tablet (10 mg total) by mouth daily.     clonazePAM 0.5 MG tablet  Commonly known as:  KLONOPIN  Take 1 tablet (0.5 mg total) by mouth 2 (two) times daily.     cloNIDine 0.2 MG tablet  Commonly known as:  CATAPRES  Take 1 tablet (0.2 mg total) by mouth 3 (three)  times daily.     clopidogrel 75 MG tablet  Commonly known as:  PLAVIX  Take 1 tablet (75 mg total) by mouth daily.     fentaNYL 50 MCG/HR  Commonly known as:  DURAGESIC - dosed mcg/hr  Place 1 patch (50 mcg total) onto the skin every 3 (three) days.     ipratropium-albuterol 0.5-2.5 (3) MG/3ML Soln  Commonly known as:  DUONEB  Take 3 mLs by nebulization 4 (four) times daily.     levofloxacin 750 MG tablet  Commonly known as:  LEVAQUIN  Take 1 tablet (750 mg total) by mouth daily.     metoprolol tartrate 12.5 mg Tabs tablet  Commonly known as:  LOPRESSOR  Take 0.5 tablets (12.5 mg total) by mouth 2 (two) times daily with a meal.     pantoprazole 40 MG tablet  Commonly known as:  PROTONIX  Take 40 mg by mouth daily as needed (for acid reflux).     pregabalin 75 MG capsule  Commonly known as:  LYRICA  Take 75 mg by mouth 2 (two) times daily.        Day of Discharge BP 117/47 mmHg  Pulse 60  Temp(Src) 99.1 F (37.3 C) (Oral)  Resp 16  Ht 5\' 7"  (1.702 m)  Wt 70.4 kg (155 lb 3.3 oz)  BMI 24.30 kg/m2  SpO2 97%  Physical Exam: General: No acute respiratory distress Lungs: Clear to auscultation bilaterally without wheezes or crackles Cardiovascular: Regular rate and rhythm without murmur gallop or rub normal S1 and S2 Abdomen: Nontender, nondistended, soft, bowel sounds positive, no rebound, no ascites, no appreciable mass Extremities: No significant cyanosis, clubbing, or edema bilateral lower extremities  Basic Metabolic Panel:  Recent Labs Lab 01/02/14 0350 01/03/14 0347 01/05/14 0348 01/05/14 1719 01/06/14 0544 01/07/14 0415  NA 139 142 140  --  142 138  K 3.1* 4.0 2.7* 3.3* 3.2* 3.6  CL 111 115* 110  --  109 104  CO2 24 21 24   --  26 27  GLUCOSE 96 93 89  --  89 132*  BUN 19 12 <5*  --  <5* 5*  CREATININE 0.72 0.72 0.72  --  0.76 0.74  CALCIUM 7.0* 7.6* 7.7*  --  7.8* 7.7*  MG  --   --   --   --   --  1.2*    Liver Function Tests:  Recent  Labs Lab 01/02/14 0350 01/03/14 0347  AST 27 37  ALT 23 39  ALKPHOS 78 81  BILITOT 0.2* 0.1*  PROT 4.3* 4.6*  ALBUMIN 1.6* 1.6*   CBC:  Recent Labs Lab 01/02/14 0350 01/03/14 0347 01/05/14 0348 01/06/14 0544  WBC 11.7* 10.0 8.6 8.5  HGB 8.9* 8.8* 8.2* 8.6*  HCT 27.9* 27.8* 25.3* 26.3*  MCV 91.8 92.4 89.1 89.8  PLT 223 249 293 315    BNP (last 3 results)  Recent Labs  11/17/13 0400 11/24/13 0245  PROBNP 1388.0* 534.8*     Recent Results (from the past 240 hour(s))  Blood Culture (routine x 2)     Status: None   Collection Time: 12/31/13  5:05 PM  Result Value Ref Range Status   Specimen Description BLOOD ARM RIGHT  Final   Special Requests BOTTLES DRAWN AEROBIC ONLY 5CC  Final   Culture  Setup Time   Final    01/01/2014 01:25 Performed at [facility] [place] [facility]   Final    NO GROWTH 5 DAYS Performed at [facility]    Report Status 01/07/2014 FINAL  Final  Urine culture     Status: None   Collection Time: 12/31/13  6:10 PM  Result Value Ref Range Status   Specimen Description URINE, RANDOM  Final   Special Requests NONE  Final   Culture  Setup Time   Final    01/01/2014 00:45 Performed at [place] Performed at [facility]   Final   Culture NO GROWTH Performed at [facility]   Final   Report Status 01/01/2014 FINAL  Final  Blood Culture (routine x 2)     Status: None   Collection Time: 12/31/13  6:31 PM  Result Value Ref Range Status   Specimen Description BLOOD ARM RIGHT  Final   Special Requests BOTTLES DRAWN AEROBIC AND ANAEROBIC [facility]  Final   Culture  Setup Time   Final    01/01/2014 01:25 Performed at [place]   Final    NO GROWTH 5 DAYS Performed at [facility]    Report Status 01/07/2014 FINAL  Final      Time spent in discharge (includes decision making & examination of pt): > 30 minutes  01/08/2014, 11:43 AM   Cherene Altes, MD [facility] Office  [phone] Pager [phone]  On-Call/Text Page:      [email or web address]      password [facility]

## 2014-01-08 NOTE — Discharge Instructions (Signed)
Pneumonia Pneumonia is an infection of the lungs.  CAUSES Pneumonia may be caused by bacteria or a virus. Usually, these infections are caused by breathing infectious particles into the lungs (respiratory tract). SIGNS AND SYMPTOMS   Cough.  Fever.  Chest pain.  Increased rate of breathing.  Wheezing.  Mucus production. DIAGNOSIS  If you have the common symptoms of pneumonia, your health care provider will typically confirm the diagnosis with a chest X-ray. The X-ray will show an abnormality in the lung (pulmonary infiltrate) if you have pneumonia. Other tests of your blood, urine, or sputum may be done to find the specific cause of your pneumonia. Your health care provider may also do tests (blood gases or pulse oximetry) to see how well your lungs are working. TREATMENT  Some forms of pneumonia may be spread to other people when you cough or sneeze. You may be asked to wear a mask before and during your exam. Pneumonia that is caused by bacteria is treated with antibiotic medicine. Pneumonia that is caused by the influenza virus may be treated with an antiviral medicine. Most other viral infections must run their course. These infections will not respond to antibiotics.  HOME CARE INSTRUCTIONS   Cough suppressants may be used if you are losing too much rest. However, coughing protects you by clearing your lungs. You should avoid using cough suppressants if you can.  Your health care provider may have prescribed medicine if he or she thinks your pneumonia is caused by bacteria or influenza. Finish your medicine even if you start to feel better.  Your health care provider may also prescribe an expectorant. This loosens the mucus to be coughed up.  Take medicines only as directed by your health care provider.  Do not smoke. Smoking is a common cause of bronchitis and can contribute to pneumonia. If you are a smoker and continue to smoke, your cough may last several weeks after your  pneumonia has cleared.  A cold steam vaporizer or humidifier in your room or home may help loosen mucus.  Coughing is often worse at night. Sleeping in a semi-upright position in a recliner or using a couple pillows under your head will help with this.  Get rest as you feel it is needed. Your body will usually let you know when you need to rest. PREVENTION A pneumococcal shot (vaccine) is available to prevent a common bacterial cause of pneumonia. This is usually suggested for:  People over 65 years old.  Patients on chemotherapy.  People with chronic lung problems, such as bronchitis or emphysema.  People with immune system problems. If you are over 65 or have a high risk condition, you may receive the pneumococcal vaccine if you have not received it before. In some countries, a routine influenza vaccine is also recommended. This vaccine can help prevent some cases of pneumonia.You may be offered the influenza vaccine as part of your care. If you smoke, it is time to quit. You may receive instructions on how to stop smoking. Your health care provider can provide medicines and counseling to help you quit. SEEK MEDICAL CARE IF: You have a fever. SEEK IMMEDIATE MEDICAL CARE IF:   Your illness becomes worse. This is especially true if you are elderly or weakened from any other disease.  You cannot control your cough with suppressants and are losing sleep.  You begin coughing up blood.  You develop pain which is getting worse or is uncontrolled with medicines.  Any of the symptoms   which initially brought you in for treatment are getting worse rather than better.  You develop shortness of breath or chest pain. MAKE SURE YOU:   Understand these instructions.  Will watch your condition.  Will get help right away if you are not doing well or get worse. Document Released: 12/27/2004 Document Revised: 05/13/2013 Document Reviewed: 03/18/2010 [facility] Patient Information 2015  [place], [place]. This information is not intended to replace advice given to you by your health care provider. Make sure you discuss any questions you have with your health care provider.  Acute Coronary Syndrome Acute coronary syndrome (ACS) is an urgent problem in which the blood and oxygen supply to the heart is critically deficient. ACS requires hospitalization because one or more coronary arteries may be blocked. ACS represents a range of conditions including:  Previous angina that is now unstable, lasts longer, happens at rest, or is more intense.  A heart attack, with heart muscle cell injury and death. There are three vital coronary arteries that supply the heart muscle with blood and oxygen so that it can pump blood effectively. If blockages to these arteries develop, blood flow to the heart muscle is reduced. If the heart does not get enough blood, angina may occur as the first warning sign. SYMPTOMS   The most common signs of angina include:  Tightness or squeezing in the chest.  Feeling of heaviness on the chest.  Discomfort in the arms, neck, back, or jaw.  Shortness of breath and nausea.  Cold, wet skin.  Angina is usually brought on by physical effort or excitement which increase the oxygen needs of the heart. These states increase the blood flow needs of the heart beyond what can be delivered.  Other symptoms that are not as common include:  Fatigue  Unexplained feelings of nervousness or anxiety  Weakness  Diarrhea  Sometimes, you may not have noticed any symptoms at all but still suffered a cardiac injury. TREATMENT   Medicines to help discomfort may include nitroglycerin (nitro) in the form of tablets or a spray for rapid relief, or longer-acting forms such as cream, patches, or capsules. (Be aware that there are many side effects and possible interactions with other drugs).  Other medicines may be used to help the heart pump better.  Procedures to open  blocked arteries including angioplasty or stent placement to keep the arteries open.  Open heart surgery may be needed when there are many blockages or they are in critical locations that are best treated with surgery. HOME CARE INSTRUCTIONS   Do not use any tobacco products including cigarettes, chewing tobacco, or electronic cigarettes.  Take one baby or adult aspirin daily, if your health care provider advises. This helps reduce the risk of a heart attack.  It is very important that you follow the angina treatment prescribed by your health care provider. Make arrangements for proper follow-up care.  Eat a heart healthy diet with salt and fat restrictions as advised.  Regular exercise is good for you as long as it does not cause discomfort. Do not begin any new type of exercise until you check with your health care provider.  If you are overweight, you should lose weight.  Try to maintain normal blood lipid levels.  Keep your blood pressure under control as recommended by your health care provider.  You should tell your health care provider right away about any increase in the severity or frequency of your chest discomfort or angina attacks. When you have angina,  you should stop what you are doing and sit down. This may bring relief in 3 to 5 minutes. If your health care provider has prescribed nitro, take it as directed.  If your health care provider has given you a follow-up appointment, it is very important to keep that appointment. Not keeping the appointment could result in a chronic or permanent injury, pain, and disability. If there is any problem keeping the appointment, you must call back to this facility for assistance. SEEK IMMEDIATE MEDICAL CARE IF:   You develop nausea, vomiting, or shortness of breath.  You feel faint, lightheaded, or pass out.  Your chest discomfort gets worse.  You are sweating or experience sudden profound fatigue.  You do not get relief of your  chest pain after 3 doses of nitro.  Your discomfort lasts longer than 15 minutes. MAKE SURE YOU:   Understand these instructions.  Will watch your condition.  Will get help right away if you are not doing well or get worse.  Take all medicines as directed by your health care provider. Document Released: 12/27/2004 Document Revised: 01/01/2013 Document Reviewed: 04/30/2013 [facility] Patient Information 2015 [place], [place]. This information is not intended to replace advice given to you by your health care provider. Make sure you discuss any questions you have with your health care provider.

## 2014-01-08 NOTE — Progress Notes (Signed)
PT Cancellation Note  Patient Details Name: Rick Church MRN: [id number] DOB: 1940/03/14   Cancelled Treatment:    Reason Eval/Treat Not Completed: Other (comment). Pt stating he has been up and "about" in room. Denies any questions or concerns regarding mobility. Recommended using RW upon acute D/C for balance, pt agreeable. Pt reports he has 24/7 (A) at home.    Elie Confer [place], [place] 01/08/2014, 1:17 PM

## 2014-01-13 ENCOUNTER — Ambulatory Visit: Payer: Medicare Other | Admitting: Cardiology

## 2014-01-17 ENCOUNTER — Telehealth: Payer: Self-pay | Admitting: Cardiology

## 2014-01-17 NOTE — Telephone Encounter (Signed)
Pt c/o medication issue: 1. Name of Medication: Metoprolol 2. How are you currently taking this medication (dosage and times per day)? Once a day 3. Are you having a reaction (difficulty breathing--STAT)?  No--pt is on oxygen 4. What is your medication issue?   Pt niece call states that the pt has seen his Family physicians Dr. Sharin Mons with [facility] physicians states that his heart rate is staying around 48 beats per minute will he need to change his medication or either come off of the metoprolol.   Swelling of the feet on a dieretic as of 01/16/2014. Please call back to discuss

## 2014-01-17 NOTE — Telephone Encounter (Addendum)
Rick Church, the pts niece, states that Dr Chapman Fitch was to fax over her notes from the pts visit with her yesterday. We have not received a fax from her office so I called and spoke with a receptionist who stated that the [place] notes are not ready to fax yet and transferred me to Dr Fulp's nurses VM. I left a message explaining that we have never seen this pt in this office before so Dr Ron Parker cant make any recommendations concerning the pts Metoprolol dose. The pt is scheduled to see Dr Ron Parker on 02/10/14 for his first visit here.

## 2014-01-17 NOTE — Telephone Encounter (Signed)
**Note De-Identified  Obfuscation** I called Rick Church back and explained the situation to her and advised her to contact Dr Wolfgang Phoenix office as we cannot make any recommendations concerning the pts care until he is seen in an OV here. She verbalized understanding and states that she will call Dr Wolfgang Phoenix office concerning the pts low HR.

## 2014-01-21 NOTE — Telephone Encounter (Signed)
Spoke with patient's brother and advised that we can't make any medication changes over the phone (as Jeani Hawking Via LPN advised Deeanna on 1/8). He thinks that Dr.Fulp stopped his "heart medication". I advised him to call Dr.Fulp with the heart rate question and if needed our DOD could speak with Dr.Fulp because Dr.Katz is not in the office today.

## 2014-01-21 NOTE — Telephone Encounter (Signed)
Follow Up   Pt brother call Heart rate was recently around 77 and 42. Requests a call back to determine if this is normal. Other Symptoms are: Generally doesnt feel good. Brother Simply requests a call back to determine if a sooner appt is needed//sr

## 2014-01-22 ENCOUNTER — Inpatient Hospital Stay (HOSPITAL_COMMUNITY)
Admission: EM | Admit: 2014-01-22 | Discharge: 2014-01-25 | DRG: 308 | Disposition: A | Discharge status: Home / Self Care | Payer: Medicare Other | Attending: Internal Medicine | Admitting: Internal Medicine

## 2014-01-22 ENCOUNTER — Emergency Department (HOSPITAL_COMMUNITY): Payer: Medicare Other

## 2014-01-22 ENCOUNTER — Encounter (HOSPITAL_COMMUNITY): Payer: Self-pay | Admitting: Emergency Medicine

## 2014-01-22 DIAGNOSIS — Z8673 Personal history of transient ischemic attack (TIA), and cerebral infarction without residual deficits: Secondary | ICD-10-CM

## 2014-01-22 DIAGNOSIS — Z886 Allergy status to analgesic agent status: Secondary | ICD-10-CM

## 2014-01-22 DIAGNOSIS — R531 Weakness: Secondary | ICD-10-CM

## 2014-01-22 DIAGNOSIS — F410 Panic disorder [episodic paroxysmal anxiety] without agoraphobia: Secondary | ICD-10-CM | POA: Diagnosis present

## 2014-01-22 DIAGNOSIS — R778 Other specified abnormalities of plasma proteins: Secondary | ICD-10-CM

## 2014-01-22 DIAGNOSIS — Z888 Allergy status to other drugs, medicaments and biological substances status: Secondary | ICD-10-CM | POA: Diagnosis not present

## 2014-01-22 DIAGNOSIS — Y95 Nosocomial condition: Secondary | ICD-10-CM | POA: Diagnosis present

## 2014-01-22 DIAGNOSIS — K219 Gastro-esophageal reflux disease without esophagitis: Secondary | ICD-10-CM | POA: Diagnosis present

## 2014-01-22 DIAGNOSIS — I5031 Acute diastolic (congestive) heart failure: Secondary | ICD-10-CM | POA: Diagnosis present

## 2014-01-22 DIAGNOSIS — Z880 Allergy status to penicillin: Secondary | ICD-10-CM

## 2014-01-22 DIAGNOSIS — E785 Hyperlipidemia, unspecified: Secondary | ICD-10-CM | POA: Diagnosis present

## 2014-01-22 DIAGNOSIS — I252 Old myocardial infarction: Secondary | ICD-10-CM | POA: Diagnosis not present

## 2014-01-22 DIAGNOSIS — Z9181 History of falling: Secondary | ICD-10-CM | POA: Diagnosis not present

## 2014-01-22 DIAGNOSIS — E780 Pure hypercholesterolemia: Secondary | ICD-10-CM | POA: Diagnosis present

## 2014-01-22 DIAGNOSIS — D649 Anemia, unspecified: Secondary | ICD-10-CM | POA: Diagnosis present

## 2014-01-22 DIAGNOSIS — M25569 Pain in unspecified knee: Secondary | ICD-10-CM

## 2014-01-22 DIAGNOSIS — Q211 Atrial septal defect: Secondary | ICD-10-CM | POA: Diagnosis not present

## 2014-01-22 DIAGNOSIS — E876 Hypokalemia: Secondary | ICD-10-CM | POA: Diagnosis present

## 2014-01-22 DIAGNOSIS — E162 Hypoglycemia, unspecified: Secondary | ICD-10-CM | POA: Diagnosis present

## 2014-01-22 DIAGNOSIS — M25561 Pain in right knee: Secondary | ICD-10-CM | POA: Diagnosis not present

## 2014-01-22 DIAGNOSIS — J189 Pneumonia, unspecified organism: Secondary | ICD-10-CM | POA: Diagnosis present

## 2014-01-22 DIAGNOSIS — R001 Bradycardia, unspecified: Secondary | ICD-10-CM | POA: Diagnosis present

## 2014-01-22 DIAGNOSIS — W19XXXA Unspecified fall, initial encounter: Secondary | ICD-10-CM | POA: Diagnosis present

## 2014-01-22 DIAGNOSIS — R7989 Other specified abnormal findings of blood chemistry: Secondary | ICD-10-CM | POA: Diagnosis present

## 2014-01-22 DIAGNOSIS — G9340 Encephalopathy, unspecified: Secondary | ICD-10-CM | POA: Insufficient documentation

## 2014-01-22 DIAGNOSIS — I440 Atrioventricular block, first degree: Secondary | ICD-10-CM | POA: Diagnosis present

## 2014-01-22 DIAGNOSIS — J449 Chronic obstructive pulmonary disease, unspecified: Secondary | ICD-10-CM | POA: Diagnosis present

## 2014-01-22 DIAGNOSIS — I10 Essential (primary) hypertension: Secondary | ICD-10-CM | POA: Diagnosis present

## 2014-01-22 DIAGNOSIS — Z87891 Personal history of nicotine dependence: Secondary | ICD-10-CM

## 2014-01-22 DIAGNOSIS — M1990 Unspecified osteoarthritis, unspecified site: Secondary | ICD-10-CM | POA: Diagnosis present

## 2014-01-22 DIAGNOSIS — J439 Emphysema, unspecified: Secondary | ICD-10-CM | POA: Diagnosis present

## 2014-01-22 HISTORY — DX: Adverse effect of unspecified anesthetic, initial encounter: T41.45XA

## 2014-01-22 HISTORY — DX: Headache: R51

## 2014-01-22 HISTORY — DX: Other specified postprocedural states: Z98.890

## 2014-01-22 HISTORY — DX: Other specified postprocedural states: R11.2

## 2014-01-22 HISTORY — DX: Unspecified osteoarthritis, unspecified site: M19.90

## 2014-01-22 HISTORY — DX: Headache, unspecified: R51.9

## 2014-01-22 HISTORY — DX: Personal history of other diseases of the digestive system: Z87.19

## 2014-01-22 LAB — I-STAT CG4 LACTIC ACID, ED: LACTIC ACID, VENOUS: 1.04 mmol/L (ref 0.5–2.2)

## 2014-01-22 LAB — CBC WITH DIFFERENTIAL/PLATELET
Basophils Absolute: 0 10*3/uL (ref 0.0–0.1)
Basophils Relative: 0 % (ref 0–1)
Eosinophils Absolute: 0.2 10*3/uL (ref 0.0–0.7)
Eosinophils Relative: 3 % (ref 0–5)
HCT: 32.9 % — ABNORMAL LOW (ref 39.0–52.0)
Hemoglobin: 10.5 g/dL — ABNORMAL LOW (ref 13.0–17.0)
Lymphocytes Relative: 19 % (ref 12–46)
Lymphs Abs: 1.5 10*3/uL (ref 0.7–4.0)
MCH: 28.9 pg (ref 26.0–34.0)
MCHC: 31.9 g/dL (ref 30.0–36.0)
MCV: 90.6 fL (ref 78.0–100.0)
Monocytes Absolute: 0.4 10*3/uL (ref 0.1–1.0)
Monocytes Relative: 5 % (ref 3–12)
NEUTROS ABS: 5.7 10*3/uL (ref 1.7–7.7)
Neutrophils Relative %: 73 % (ref 43–77)
Platelets: 269 10*3/uL (ref 150–400)
RBC: 3.63 MIL/uL — ABNORMAL LOW (ref 4.22–5.81)
RDW: 17 % — AB (ref 11.5–15.5)
WBC: 7.8 10*3/uL (ref 4.0–10.5)

## 2014-01-22 LAB — BASIC METABOLIC PANEL
Anion gap: 10 (ref 5–15)
BUN: 9 mg/dL (ref 6–23)
CHLORIDE: 106 meq/L (ref 96–112)
CO2: 28 mmol/L (ref 19–32)
CREATININE: 0.85 mg/dL (ref 0.50–1.35)
Calcium: 8.1 mg/dL — ABNORMAL LOW (ref 8.4–10.5)
GFR calc Af Amer: >90 mL/min (ref >90)
GFR calc non Af Amer: 85 mL/min — ABNORMAL LOW (ref >90)
GLUCOSE: 92 mg/dL (ref 70–99)
Potassium: 3 mmol/L — ABNORMAL LOW (ref 3.5–5.1)
Sodium: 144 mmol/L (ref 135–145)

## 2014-01-22 LAB — TROPONIN I: Troponin I: 0.04 ng/mL — ABNORMAL HIGH (ref <0.031)

## 2014-01-22 LAB — CBG MONITORING, ED: Glucose-Capillary: 86 mg/dL (ref 70–99)

## 2014-01-22 MED ORDER — FENTANYL 25 MCG/HR TD PT72
50.0000 ug | MEDICATED_PATCH | TRANSDERMAL | Status: DC
  Administered 2014-01-23: 50 ug via TRANSDERMAL
  Filled 2014-01-22: qty 2

## 2014-01-22 MED ORDER — SODIUM CHLORIDE 0.9 % IV SOLN
INTRAVENOUS | Status: DC
  Administered 2014-01-22: via INTRAVENOUS

## 2014-01-22 MED ORDER — CITALOPRAM HYDROBROMIDE 10 MG PO TABS
10.0000 mg | ORAL_TABLET | Freq: Every day | ORAL | Status: DC
  Administered 2014-01-23 – 2014-01-25 (×3): 10 mg via ORAL
  Filled 2014-01-22 (×3): qty 1

## 2014-01-22 MED ORDER — CLONAZEPAM 0.5 MG PO TABS
0.5000 mg | ORAL_TABLET | Freq: Two times a day (BID) | ORAL | Status: DC
  Administered 2014-01-23 – 2014-01-25 (×5): 0.5 mg via ORAL
  Filled 2014-01-22 (×5): qty 1

## 2014-01-22 MED ORDER — POTASSIUM CHLORIDE CRYS ER 20 MEQ PO TBCR
40.0000 meq | EXTENDED_RELEASE_TABLET | Freq: Once | ORAL | Status: AC
  Administered 2014-01-22: 40 meq via ORAL
  Filled 2014-01-22: qty 2

## 2014-01-22 MED ORDER — SIMVASTATIN 40 MG PO TABS
40.0000 mg | ORAL_TABLET | Freq: Every day | ORAL | Status: DC
  Administered 2014-01-23 – 2014-01-24 (×2): 40 mg via ORAL
  Filled 2014-01-22 (×3): qty 1

## 2014-01-22 MED ORDER — BUTALBITAL-APAP-CAFFEINE 50-325-40 MG PO TABS
1.0000 | ORAL_TABLET | Freq: Four times a day (QID) | ORAL | Status: DC | PRN
  Administered 2014-01-23 – 2014-01-24 (×5): 1 via ORAL
  Filled 2014-01-22 (×7): qty 1

## 2014-01-22 MED ORDER — HEPARIN SODIUM (PORCINE) 5000 UNIT/ML IJ SOLN
5000.0000 [IU] | Freq: Three times a day (TID) | INTRAMUSCULAR | Status: DC
  Administered 2014-01-23 – 2014-01-25 (×8): 5000 [IU] via SUBCUTANEOUS
  Filled 2014-01-22 (×12): qty 1

## 2014-01-22 MED ORDER — BUDESONIDE 0.5 MG/2ML IN SUSP
0.5000 mg | Freq: Two times a day (BID) | RESPIRATORY_TRACT | Status: DC
  Administered 2014-01-23 – 2014-01-25 (×6): 0.5 mg via RESPIRATORY_TRACT
  Filled 2014-01-22 (×8): qty 2

## 2014-01-22 MED ORDER — CLOPIDOGREL BISULFATE 75 MG PO TABS
75.0000 mg | ORAL_TABLET | Freq: Every day | ORAL | Status: DC
  Administered 2014-01-23 – 2014-01-25 (×3): 75 mg via ORAL
  Filled 2014-01-22 (×3): qty 1

## 2014-01-22 MED ORDER — SODIUM CHLORIDE 0.9 % IJ SOLN
3.0000 mL | Freq: Two times a day (BID) | INTRAMUSCULAR | Status: DC
  Administered 2014-01-23 – 2014-01-25 (×3): 3 mL via INTRAVENOUS

## 2014-01-22 MED ORDER — IPRATROPIUM-ALBUTEROL 0.5-2.5 (3) MG/3ML IN SOLN
3.0000 mL | Freq: Four times a day (QID) | RESPIRATORY_TRACT | Status: DC
  Administered 2014-01-23 (×2): 3 mL via RESPIRATORY_TRACT
  Filled 2014-01-22 (×2): qty 3

## 2014-01-22 MED ORDER — HYDRALAZINE HCL 20 MG/ML IJ SOLN: 5.0000 mg | INTRAMUSCULAR | Status: DC | PRN

## 2014-01-22 NOTE — ED Provider Notes (Signed)
CSN: [id number]     Arrival date & time 01/22/14  1806 History   First MD Initiated Contact with Patient 01/22/14 1838     Chief Complaint  Patient presents with  . Fall  . Loss of Consciousness     (Consider location/radiation/quality/duration/timing/severity/associated sxs/prior Treatment) Patient is a 74 y.o. male presenting with fall and syncope. The history is provided by the patient.  Fall  Loss of Consciousness He noted onset today of feeling weak and lightheaded. He has fallen several times. He feels better sitting than standing but feels worse supine. He denies chest pain, heaviness, tightness, pressure. There has been no dyspnea, nausea, vomiting, diaphoresis. Visiting nurse noted that his heart rate was very slow and he was advised to come to the emergency department. Of note, his PCP had discontinued his metoprolol 5 days ago because of slow heart rate  Past Medical History  Diagnosis Date  . High cholesterol   . Hypertension   . Panic attack   . Acute respiratory distress syndrome (ARDS)   . Pneumonia   . COPD (chronic obstructive pulmonary disease)    Past Surgical History  Procedure Laterality Date  . Foot neuroma surgery Left 09/29/2101  . Back surgery    . Tracheostomy      feinstein  . Left heart catheterization with coronary angiogram N/A 01/06/2014    Procedure: LEFT HEART CATHETERIZATION WITH CORONARY ANGIOGRAM;  Surgeon: Peter M Martinique, MD;  Location: [facility] CATH LAB;  Service: Cardiovascular;  Laterality: N/A;   No family history on file. History  Substance Use Topics  . Smoking status: Former Smoker -- 0.00 packs/day    Types: Cigarettes    Start date: 11/14/1956  . Smokeless tobacco: Not on file  . Alcohol Use: No    Review of Systems  Cardiovascular: Positive for syncope.  All other systems reviewed and are negative.     Allergies  Atorvastatin; Codeine; Indocin; Penicillins; and Asa  Home Medications   Prior to Admission medications    Medication Sig Start Date End Date Taking? Authorizing Provider  budesonide (PULMICORT) 0.5 MG/2ML nebulizer solution Take 2 mLs (0.5 mg total) by nebulization 2 (two) times daily. 12/16/13  Yes Grace Bushy Minor, NP  citalopram (CELEXA) 10 MG tablet Take 1 tablet (10 mg total) by mouth daily. Patient taking differently: Take 10 mg by mouth at bedtime.  12/16/13  Yes Grace Bushy Minor, NP  clonazePAM (KLONOPIN) 0.5 MG tablet Take 1 tablet (0.5 mg total) by mouth 2 (two) times daily. 01/08/14  Yes Cherene Altes, MD  atorvastatin (LIPITOR) 80 MG tablet Take 1 tablet (80 mg total) by mouth daily. 01/08/14   Cherene Altes, MD  cloNIDine (CATAPRES) 0.2 MG tablet Take 1 tablet (0.2 mg total) by mouth 3 (three) times daily. 12/16/13   Grace Bushy Minor, NP  clopidogrel (PLAVIX) 75 MG tablet Take 1 tablet (75 mg total) by mouth daily. 01/08/14   Cherene Altes, MD  fentaNYL (DURAGESIC - DOSED MCG/HR) 50 MCG/HR Place 1 patch (50 mcg total) onto the skin every 3 (three) days. 12/16/13   Grace Bushy Minor, NP  ipratropium-albuterol (DUONEB) 0.5-2.5 (3) MG/3ML SOLN Take 3 mLs by nebulization 4 (four) times daily. 12/16/13   Grace Bushy Minor, NP  levofloxacin (LEVAQUIN) 750 MG tablet Take 1 tablet (750 mg total) by mouth daily. 01/08/14   Cherene Altes, MD  metoprolol tartrate (LOPRESSOR) 12.5 mg TABS tablet Take 0.5 tablets (12.5 mg total) by mouth 2 (two) times daily  with a meal. 01/08/14   Cherene Altes, MD  pantoprazole (PROTONIX) 40 MG tablet Take 40 mg by mouth daily as needed (for acid reflux).  06/17/13   Historical Provider, MD  pregabalin (LYRICA) 75 MG capsule Take 75 mg by mouth 2 (two) times daily.    Historical Provider, MD   BP 182/70 mmHg  Pulse 85  Temp(Src) 98 F (36.7 C) (Oral)  Resp 17  Ht 5\' 7"  (1.702 m)  Wt 155 lb (70.308 kg)  BMI 24.27 kg/m2  SpO2 98% Physical Exam  Nursing note and vitals reviewed.  74 year old male, resting comfortably and in no acute distress. Vital signs  are significant for bradycardia and hypertension. Oxygen saturation is 98%, which is normal. Head is normocephalic and atraumatic. PERRLA, EOMI. Oropharynx is clear. Neck is nontender and supple without adenopathy or JVD. Back is nontender and there is no CVA tenderness. Lungs are clear without rales, wheezes, or rhonchi. Chest is nontender. Heart has a bradycardic regular rhythm without murmur. Abdomen is soft, flat, nontender without masses or hepatosplenomegaly and peristalsis is normoactive. Extremities have 1+ edema, full range of motion is present. Skin is warm and dry without rash. Neurologic: Mental status is normal, cranial nerves are intact, there are no motor or sensory deficits.  ED Course  Procedures (including critical care time) Labs Review Results for orders placed or performed during the hospital encounter of 01/22/14  CBC with Differential  Result Value Ref Range   WBC 7.8 4.0 - 10.5 K/uL   RBC 3.63 (L) 4.22 - 5.81 MIL/uL   Hemoglobin 10.5 (L) 13.0 - 17.0 g/dL   HCT 32.9 (L) 39.0 - 52.0 %   MCV 90.6 78.0 - 100.0 fL   MCH 28.9 26.0 - 34.0 pg   MCHC 31.9 30.0 - 36.0 g/dL   RDW 17.0 (H) 11.5 - 15.5 %   Platelets 269 150 - 400 K/uL   Neutrophils Relative % 73 43 - 77 %   Neutro Abs 5.7 1.7 - 7.7 K/uL   Lymphocytes Relative 19 12 - 46 %   Lymphs Abs 1.5 0.7 - 4.0 K/uL   Monocytes Relative 5 3 - 12 %   Monocytes Absolute 0.4 0.1 - 1.0 K/uL   Eosinophils Relative 3 0 - 5 %   Eosinophils Absolute 0.2 0.0 - 0.7 K/uL   Basophils Relative 0 0 - 1 %   Basophils Absolute 0.0 0.0 - 0.1 K/uL  Basic metabolic panel  Result Value Ref Range   Sodium 144 135 - 145 mmol/L   Potassium 3.0 (L) 3.5 - 5.1 mmol/L   Chloride 106 96 - 112 mEq/L   CO2 28 19 - 32 mmol/L   Glucose, Bld 92 70 - 99 mg/dL   BUN 9 6 - 23 mg/dL   Creatinine, Ser 0.85 0.50 - 1.35 mg/dL   Calcium 8.1 (L) 8.4 - 10.5 mg/dL   GFR calc non Af Amer 85 (L) >90 mL/min   GFR calc Af Amer >90 >90 mL/min   Anion  gap 10 5 - 15  Troponin I  Result Value Ref Range   Troponin I 0.04 (H) <0.031 ng/mL  CBG, ED  Result Value Ref Range   Glucose-Capillary 86 70 - 99 mg/dL  I-Stat CG4 Lactic Acid, ED  Result Value Ref Range   Lactic Acid, Venous 1.04 0.5 - 2.2 mmol/L   Imaging Review Dg Chest Port 1 View  01/22/2014   CLINICAL DATA:  Weakness. Dizziness and shortness of  breath for 1 day. Initial encounter.  EXAM: PORTABLE CHEST - 1 VIEW  COMPARISON:  12/31/2013  FINDINGS: Two frontal radiographs. Lower cervical spine fixation. Numerous leads and wires project over the chest. Cardiomegaly accentuated by AP portable technique. Small right pleural effusion is new or increased. There is likely a small layering left pleural effusion. No pneumothorax. Worsened right base airspace disease. New left base airspace disease.  IMPRESSION: Worsened right and developing left base airspace disease. This could represent atelectasis or infection.  New or increased small right pleural effusion. Suspected trace left pleural fluid.   Electronically Signed   By: Abigail Miyamoto M.D.   On: 01/22/2014 19:09     EKG Interpretation   Date/Time:  Wednesday January 22 2014 18:12:11 EST Ventricular Rate:  44 PR Interval:  228 QRS Duration: 102 QT Interval:  635 QTC Calculation: 543 R Axis:   -64 Text Interpretation:  Age not entered, assumed to be  74 years old for  purpose of ECG interpretation Sinus bradycardia Prolonged PR interval Left  anterior fascicular block Anterior infarct, age indeterminate Prolonged QT  interval Baseline wander in lead(s) V6 When compared with ECG of  12/31/2013, HEART RATE has decreased Confirmed by [facility]  MD, Justa Hatchell ([id number])  on 01/22/2014 6:38:13 PM      MDM   Final diagnoses:  Weakness  Sinus bradycardia  Hypokalemia  Elevated troponin I level    Weakness with profound bradycardia. It is possible this is related to his use of beta blockers but 5 days later, it should have completely left  his system. He may need to be evaluated for permanent pacemaker. Hypokalemia is noted and he is given oral potassium.  Cases been discussed with Dr. Aundra Dubin of cardiology service who feels that weakness is likely due to some other cause then bradycardia. He recommends discontinuing clonidine since that can cause bradycardia. He recommends patient be admitted to internal medicine service. Case is discussed with Dr. Blaine Hamper of [facility] who agrees to admit the patient. 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   Delora Fuel, MD 65/68/12 [id number]

## 2014-01-22 NOTE — ED Notes (Signed)
MD at bedside. 

## 2014-01-22 NOTE — ED Notes (Signed)
Cariology at bedside  

## 2014-01-22 NOTE — ED Notes (Addendum)
Per EMS: pt from home for eval of multiple syncopal episodes today, pt seen here for same and admitted on 01/10/14. Pt reports hitting is head, does reports hx of blood thinner medication but cannot remember the name. Pt states increased in dizziness, weakness, and sob. EMS noted new bp medications being prescribed and also pt reports that he was started on lasix for possible edema. Pt tearful and frustrated upon arrival to ED due to no prognosis and continued fall. Skin tear noted to right upper back from fall. Denies any pain at this time.

## 2014-01-22 NOTE — ED Notes (Signed)
Pt states, "I have a splitting headache". Pt asked for a pain meds for headache.

## 2014-01-22 NOTE — H&P (Signed)
[facility] History and Physical  Rick Church ANV:[id number] DOB: [birth year] DOA: 01/22/2014  Referring physician: ED physician PCP: Antony Blackbird, MD  Specialists:   Chief Complaint: Fall and bradycardia.  HPI: Rick Church is a 74 y.o. male with past medical history of hypertension, hyperlipidemia, panic attack, COPD, who presents with fall and bradycardia.  Patient was recently hospitalized from 12/22-12/30 because of healthcare associated pneumonia. He was discharged in stable condition on Levaquin. Patient completed Levaquin. He has been doing fine until today when he had fall twice. Patient reports that he felt weak, and had fall twice. No head injury. He did not have chest pain, but had one episode of palpitations and shortness of breath last night. He reports that because of bradycardia, his metoprolol was discontinued by his PCP on Thursday. Currently, he does not have chest pain, but has mild shortness of breath which is at baseline. He has mild cough with tiny amount of sputum production. He does not have fever or chills.   Patient reports that in the past 2 weeks he has very mild slurred speech, and mild difficult speaking. He does not have unilateral weakness, numbness or tingling sensations in his extremities. Patient denies fever, chills, chest pain, abdominal pain, diarrhea, dysuria, urgency, frequency, hematuria, skin rashes.   Work up in the ED demonstrates bradycardia with heart rate down to 39. Hypokalemia with potassium 3.0, slightly elevated troponin at 0.04, lactate 1.04,  EKG showed prolonged QTc interval at 543, first degree AV block. Patient is admitted to inpatient for further evaluation and treatment.  Review of Systems: As presented in the history of presenting illness, rest negative.  Where does patient live?  At home Can patient participate in ADLs? liitle  Allergy:  Allergies  Allergen Reactions  . Atorvastatin Other (See Comments)    leg myalgias   . Codeine Nausea And Vomiting  . Indocin [Indomethacin] Nausea Only and Other (See Comments)    dizzy  . Penicillins Hives    Tolerated a dose of cefepime 12/31/13  . Asa [Aspirin] Itching and Rash    Past Medical History  Diagnosis Date  . High cholesterol   . Hypertension   . Panic attack   . Acute respiratory distress syndrome (ARDS)   . Pneumonia   . COPD (chronic obstructive pulmonary disease)     Past Surgical History  Procedure Laterality Date  . Foot neuroma surgery Left 09/29/2101  . Back surgery    . Tracheostomy      feinstein  . Left heart catheterization with coronary angiogram N/A 01/06/2014    Procedure: LEFT HEART CATHETERIZATION WITH CORONARY ANGIOGRAM;  Surgeon: Peter M Martinique, MD;  Location: [facility] CATH LAB;  Service: Cardiovascular;  Laterality: N/A;    Social History:  reports that he has quit smoking. His smoking use included Cigarettes. He started smoking about 57 years ago. He smoked 0.00 packs per day. He does not have any smokeless tobacco history on file. He reports that he does not drink alcohol or use illicit drugs.  Family History:  Family History  Problem Relation Age of Onset  . Hypertension Mother   . Hypertension Brother   . Brain cancer Mother   . Cervical cancer Sister      Prior to Admission medications   Medication Sig Start Date End Date Taking? Authorizing Provider  budesonide (PULMICORT) 0.5 MG/2ML nebulizer solution Take 2 mLs (0.5 mg total) by nebulization 2 (two) times daily. Patient taking differently: Take 0.5 mg by nebulization  every 6 (six) hours.  12/16/13  Yes Grace Bushy Minor, NP  butalbital-acetaminophen-caffeine (FIORICET, ESGIC) 50-325-40 MG per tablet Take 1 tablet by mouth daily as needed. migraines 01/17/14  Yes Historical Provider, MD  citalopram (CELEXA) 10 MG tablet Take 1 tablet (10 mg total) by mouth daily. Patient taking differently: Take 10 mg by mouth at bedtime.  12/16/13  Yes Grace Bushy Minor, NP  clonazePAM  (KLONOPIN) 0.5 MG tablet Take 1 tablet (0.5 mg total) by mouth 2 (two) times daily. Patient taking differently: Take 0.5 mg by mouth at bedtime.  01/08/14  Yes Cherene Altes, MD  cloNIDine (CATAPRES) 0.2 MG tablet Take 1 tablet (0.2 mg total) by mouth 3 (three) times daily. 12/16/13  Yes Grace Bushy Minor, NP  clopidogrel (PLAVIX) 75 MG tablet Take 1 tablet (75 mg total) by mouth daily. 01/08/14  Yes Cherene Altes, MD  fentaNYL (DURAGESIC - DOSED MCG/HR) 50 MCG/HR Place 1 patch (50 mcg total) onto the skin every 3 (three) days. 12/16/13  Yes William S Minor, NP  OXYGEN Inhale 5 L into the lungs continuous.   Yes Historical Provider, MD  simvastatin (ZOCOR) 40 MG tablet Take 40 mg by mouth daily at 6 PM.   Yes Historical Provider, MD  atorvastatin (LIPITOR) 80 MG tablet Take 1 tablet (80 mg total) by mouth daily. 01/08/14   Cherene Altes, MD  ipratropium-albuterol (DUONEB) 0.5-2.5 (3) MG/3ML SOLN Take 3 mLs by nebulization 4 (four) times daily. 12/16/13   Grace Bushy Minor, NP  levofloxacin (LEVAQUIN) 750 MG tablet Take 1 tablet (750 mg total) by mouth daily. 01/08/14   Cherene Altes, MD  metoprolol tartrate (LOPRESSOR) 12.5 mg TABS tablet Take 0.5 tablets (12.5 mg total) by mouth 2 (two) times daily with a meal. 01/08/14   Cherene Altes, MD    Physical Exam: Filed Vitals:   01/23/14 0022 01/23/14 0145 01/23/14 0149 01/23/14 0237  BP:    170/59  Pulse:    53  Temp:    97.5 F (36.4 C)  TempSrc:    Oral  Resp:    20  Height:      Weight:      SpO2: 97% 82% 93% 91%   General: Not in acute distress HEENT:       Eyes: PERRL, EOMI, no scleral icterus       ENT: No discharge from the ears and nose, no pharynx injection, no tonsillar enlargement.        Neck: No JVD, no bruit, no mass felt. Cardiac: S1/S2, RRR, bradycardia, No murmurs, No gallops or rubs Pulm: Good air movement bilaterally. Clear to auscultation bilaterally. No rales, wheezing, rhonchi or rubs. Abd: Soft,  nondistended, nontender, no rebound pain, no organomegaly, BS present Ext: No edema bilaterally. 2+DP/PT pulse bilaterally Musculoskeletal: No joint deformities, erythema, or stiffness, ROM full Skin: No rashes.  Neuro: Alert and oriented X3, cranial nerves II-XII grossly intact, muscle strength 5/5 in all extremeties, sensation to light touch intact. Brachial reflex 2+ bilaterally. Knee reflex 1+ bilaterally. Negative Babinski's sign. Normal finger to nose test. Psych: Patient is not psychotic, no suicidal or hemocidal ideation.  Labs on Admission:  Basic Metabolic Panel:  Recent Labs Lab 01/22/14 1855  NA 144  K 3.0*  CL 106  CO2 28  GLUCOSE 92  BUN 9  CREATININE 0.85  CALCIUM 8.1*   Liver Function Tests:  Recent Labs Lab 01/22/14 1855  AST 16  ALT 9  ALKPHOS 113  BILITOT  0.4  PROT 5.3*  ALBUMIN 2.3*   No results for input(s): LIPASE, AMYLASE in the last 168 hours. No results for input(s): AMMONIA in the last 168 hours. CBC:  Recent Labs Lab 01/22/14 1855  WBC 7.8  NEUTROABS 5.7  HGB 10.5*  HCT 32.9*  MCV 90.6  PLT 269   Cardiac Enzymes:  Recent Labs Lab 01/22/14 1855 01/23/14 0113  TROPONINI 0.04* 0.04*    BNP (last 3 results)  Recent Labs  11/17/13 0400 11/24/13 0245  PROBNP 1388.0* 534.8*   CBG:  Recent Labs Lab 01/22/14 1859  GLUCAP 86    Radiological Exams on Admission: Ct Head Wo Contrast  01/23/2014   CLINICAL DATA:  Status post fall, with slurred speech. Initial encounter.  EXAM: CT HEAD WITHOUT CONTRAST  TECHNIQUE: Contiguous axial images were obtained from the base of the skull through the vertex without intravenous contrast.  COMPARISON:  CT of the head performed 11/14/2013  FINDINGS: There is no evidence of acute infarction, mass lesion, or intra- or extra-axial hemorrhage on CT.  Prominence of the sulci suggest mild cortical volume loss. Mild cerebellar atrophy is noted. A chronic lacunar infarct is seen at the right pons. Mild  periventricular white matter change likely reflects small vessel ischemic microangiopathy.  The brainstem and fourth ventricle are within normal limits. The basal ganglia are unremarkable in appearance. The cerebral hemispheres demonstrate grossly normal gray-white differentiation. No mass effect or midline shift is seen.  There is no evidence of fracture; visualized osseous structures are unremarkable in appearance. There is incomplete fusion of the posterior arch of C1. The orbits are within normal limits. A mucus retention cyst or polyp is noted at the left maxillary sinus. The remaining paranasal sinuses and mastoid air cells are well-aerated. No significant soft tissue abnormalities are seen.  IMPRESSION: 1. No acute intracranial pathology seen on CT. 2. Mild cortical volume loss and scattered small vessel ischemic microangiopathy. 3. 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 4. Mucus retention cyst or polyp at the left maxillary sinus.   Electronically Signed   By: Garald Balding M.D.   On: 01/23/2014 04:13   Dg Chest Port 1 View  01/22/2014   CLINICAL DATA:  Weakness. Dizziness and shortness of breath for 1 day. Initial encounter.  EXAM: PORTABLE CHEST - 1 VIEW  COMPARISON:  12/31/2013  FINDINGS: Two frontal radiographs. Lower cervical spine fixation. Numerous leads and wires project over the chest. Cardiomegaly accentuated by AP portable technique. Small right pleural effusion is new or increased. There is likely a small layering left pleural effusion. No pneumothorax. Worsened right base airspace disease. New left base airspace disease.  IMPRESSION: Worsened right and developing left base airspace disease. This could represent atelectasis or infection.  New or increased small right pleural effusion. Suspected trace left pleural fluid.   Electronically Signed   By: Abigail Miyamoto M.D.   On: 01/22/2014 19:09    EKG: Independently reviewed.   Assessment/Plan Principal Problem:   Fall Active  Problems:   COPD with emphysema   Elevated troponin   Bradycardia   HLD (hyperlipidemia)   HTN (hypertension)   Panic attack   Weakness   Hypokalemia  Fall: Most likely due to bradycardia. Patient reports having slurry speech in the past 2 weeks, need to rule out stroke. Patient has hardware in his neck, not be a good candidate for MRI. -will admit to tele bed. -Hold clonidine -Metoprolol was discontinued -CT head without contrast  Elevated troponin: Patient does  not have chest pain. It is likely due to demanding ischemia. -Cycle troponin 3  Panic attack: -Continue clonazepam, and Celexa  Hypertension: -Hold clonidine -Hydralazine when necessary -Start the low-dose of amlodipine, 5 mg daily  Hypoglycemia: Potassium is 3.0 -Repleted  COPD: No acute exacerbation. Patient is on 4 L of oxygen -Continue home medications and oxygen  Hyperlipidemia: -Continue Zocor  DVT ppx: SQ Heparin     Code Status: Full code Family Communication:   Yes, patient's nephew and niece   at bed side Disposition Plan: Admit to inpatient   Date of Service 01/23/2014    Ivor Costa [facility] Pager [phone]  If 7PM-7AM, please contact night-coverage [email or web address] Password [facility] 01/23/2014, 6:52 AM

## 2014-01-23 ENCOUNTER — Encounter (HOSPITAL_COMMUNITY): Payer: Self-pay | Admitting: Internal Medicine

## 2014-01-23 ENCOUNTER — Inpatient Hospital Stay (HOSPITAL_COMMUNITY): Payer: Medicare Other

## 2014-01-23 DIAGNOSIS — J189 Pneumonia, unspecified organism: Secondary | ICD-10-CM

## 2014-01-23 DIAGNOSIS — I059 Rheumatic mitral valve disease, unspecified: Secondary | ICD-10-CM

## 2014-01-23 LAB — BASIC METABOLIC PANEL
ANION GAP: 13 (ref 5–15)
BUN: 10 mg/dL (ref 6–23)
CALCIUM: 8 mg/dL — AB (ref 8.4–10.5)
CHLORIDE: 105 meq/L (ref 96–112)
CO2: 27 mmol/L (ref 19–32)
Creatinine, Ser: 0.81 mg/dL (ref 0.50–1.35)
GFR, EST NON AFRICAN AMERICAN: 87 mL/min — AB (ref >90)
GLUCOSE: 90 mg/dL (ref 70–99)
POTASSIUM: 3.4 mmol/L — AB (ref 3.5–5.1)
Sodium: 145 mmol/L (ref 135–145)

## 2014-01-23 LAB — HEPATIC FUNCTION PANEL
ALT: 9 U/L (ref 0–53)
AST: 16 U/L (ref 0–37)
Albumin: 2.3 g/dL — ABNORMAL LOW (ref 3.5–5.2)
Alkaline Phosphatase: 113 U/L (ref 39–117)
BILIRUBIN INDIRECT: 0.3 mg/dL (ref 0.3–0.9)
Bilirubin, Direct: 0.1 mg/dL (ref 0.0–0.3)
Total Bilirubin: 0.4 mg/dL (ref 0.3–1.2)
Total Protein: 5.3 g/dL — ABNORMAL LOW (ref 6.0–8.3)

## 2014-01-23 LAB — URINALYSIS, ROUTINE W REFLEX MICROSCOPIC
Bilirubin Urine: NEGATIVE
GLUCOSE, UA: NEGATIVE mg/dL
Hgb urine dipstick: NEGATIVE
Ketones, ur: NEGATIVE mg/dL
LEUKOCYTES UA: NEGATIVE
NITRITE: NEGATIVE
PH: 7 (ref 5.0–8.0)
PROTEIN: NEGATIVE mg/dL
SPECIFIC GRAVITY, URINE: 1.022 (ref 1.005–1.030)
Urobilinogen, UA: 1 mg/dL (ref 0.0–1.0)

## 2014-01-23 LAB — CBC
HCT: 28.7 % — ABNORMAL LOW (ref 39.0–52.0)
Hemoglobin: 9.3 g/dL — ABNORMAL LOW (ref 13.0–17.0)
MCH: 29.3 pg (ref 26.0–34.0)
MCHC: 32.4 g/dL (ref 30.0–36.0)
MCV: 90.5 fL (ref 78.0–100.0)
Platelets: 246 10*3/uL (ref 150–400)
RBC: 3.17 MIL/uL — AB (ref 4.22–5.81)
RDW: 17.2 % — AB (ref 11.5–15.5)
WBC: 8 10*3/uL (ref 4.0–10.5)

## 2014-01-23 LAB — MAGNESIUM: Magnesium: 1.7 mg/dL (ref 1.5–2.5)

## 2014-01-23 LAB — PROTIME-INR
INR: 1.24 (ref 0.00–1.49)
Prothrombin Time: 15.8 seconds — ABNORMAL HIGH (ref 11.6–15.2)

## 2014-01-23 LAB — TSH: TSH: 1.309 u[IU]/mL (ref 0.350–4.500)

## 2014-01-23 LAB — TROPONIN I
Troponin I: 0.04 ng/mL — ABNORMAL HIGH (ref <0.031)
Troponin I: 0.04 ng/mL — ABNORMAL HIGH (ref <0.031)
Troponin I: 0.03 ng/mL (ref <0.031)

## 2014-01-23 LAB — GLUCOSE, CAPILLARY
GLUCOSE-CAPILLARY: 104 mg/dL — AB (ref 70–99)
GLUCOSE-CAPILLARY: 66 mg/dL — AB (ref 70–99)
GLUCOSE-CAPILLARY: 110 mg/dL — AB (ref 70–99)

## 2014-01-23 LAB — BRAIN NATRIURETIC PEPTIDE: B NATRIURETIC PEPTIDE 5: 598 pg/mL — AB (ref 0.0–100.0)

## 2014-01-23 MED ORDER — DEXTROSE 50 % IV SOLN
25.0000 mL | Freq: Once | INTRAVENOUS | Status: AC
  Administered 2014-01-23: 25 mL via INTRAVENOUS
  Filled 2014-01-23: qty 50

## 2014-01-23 MED ORDER — IPRATROPIUM-ALBUTEROL 0.5-2.5 (3) MG/3ML IN SOLN
3.0000 mL | Freq: Three times a day (TID) | RESPIRATORY_TRACT | Status: DC
  Administered 2014-01-23 – 2014-01-25 (×7): 3 mL via RESPIRATORY_TRACT
  Filled 2014-01-23 (×7): qty 3

## 2014-01-23 MED ORDER — VANCOMYCIN HCL 10 G IV SOLR
1250.0000 mg | Freq: Once | INTRAVENOUS | Status: AC
  Administered 2014-01-23: 1250 mg via INTRAVENOUS
  Filled 2014-01-23: qty 1250

## 2014-01-23 MED ORDER — AMLODIPINE BESYLATE 5 MG PO TABS
5.0000 mg | ORAL_TABLET | Freq: Every day | ORAL | Status: DC
  Administered 2014-01-23 – 2014-01-25 (×3): 5 mg via ORAL
  Filled 2014-01-23 (×3): qty 1

## 2014-01-23 MED ORDER — FUROSEMIDE 10 MG/ML IJ SOLN
40.0000 mg | Freq: Once | INTRAMUSCULAR | Status: AC
  Administered 2014-01-23: 40 mg via INTRAVENOUS
  Filled 2014-01-23: qty 4

## 2014-01-23 MED ORDER — PIPERACILLIN-TAZOBACTAM 3.375 G IVPB 30 MIN
3.3750 g | Freq: Once | INTRAVENOUS | Status: AC
  Administered 2014-01-23: 3.375 g via INTRAVENOUS
  Filled 2014-01-23: qty 50

## 2014-01-23 MED ORDER — PIPERACILLIN-TAZOBACTAM 3.375 G IVPB
3.3750 g | Freq: Three times a day (TID) | INTRAVENOUS | Status: DC
  Administered 2014-01-24 – 2014-01-25 (×5): 3.375 g via INTRAVENOUS
  Filled 2014-01-23 (×10): qty 50

## 2014-01-23 MED ORDER — CETYLPYRIDINIUM CHLORIDE 0.05 % MT LIQD
7.0000 mL | Freq: Two times a day (BID) | OROMUCOSAL | Status: DC
  Administered 2014-01-23 – 2014-01-25 (×3): 7 mL via OROMUCOSAL

## 2014-01-23 MED ORDER — DEXTROSE 50 % IV SOLN
INTRAVENOUS | Status: AC
  Filled 2014-01-23: qty 50

## 2014-01-23 MED ORDER — VANCOMYCIN HCL IN DEXTROSE 750-5 MG/150ML-% IV SOLN
750.0000 mg | Freq: Two times a day (BID) | INTRAVENOUS | Status: DC
Start: 2014-01-24 — End: 2014-01-25
  Administered 2014-01-24 – 2014-01-25 (×3): 750 mg via INTRAVENOUS
  Filled 2014-01-23 (×6): qty 150

## 2014-01-23 NOTE — Progress Notes (Signed)
[facility]  Patient Status: Active (receiving services up to time of hospitalization)  [facility] is providing the following services: RN and PT  If patient discharges after hours, please call [phone].   Janae Sauce 01/23/2014, 11:06 AM

## 2014-01-23 NOTE — Progress Notes (Signed)
ANTIBIOTIC CONSULT NOTE - INITIAL  Pharmacy Consult for vancomycin and zosyn Indication: pneumonia  Allergies  Allergen Reactions  . Atorvastatin Other (See Comments)    leg myalgias  . Codeine Nausea And Vomiting  . Indocin [Indomethacin] Nausea Only and Other (See Comments)    dizzy  . Penicillins Hives    Tolerated a dose of cefepime 12/31/13  . Asa [Aspirin] Itching and Rash    Patient Measurements: Height: 5\' 7"  (170.2 cm) Weight: 164 lb 3.2 oz (74.481 kg) IBW/kg (Calculated) : 66.1 Body Weight: 74.5 kg  Vital Signs: Temp: 97.5 F (36.4 C) (01/14 0237) Temp Source: Oral (01/14 0237) BP: 170/59 mmHg (01/14 0237) Pulse Rate: 53 (01/14 0237) Intake/Output from previous day: 01/13 0701 - 01/14 0700 In: 624.7 [P.O.:300; I.V.:324.7] Out: 500 [Urine:500] Intake/Output from this shift:    Labs:  Recent Labs  01/22/14 1855 01/23/14 0755  WBC 7.8 8.0  HGB 10.5* 9.3*  PLT 269 246  CREATININE 0.85 0.81   Estimated Creatinine Clearance: 77.1 mL/min (by C-G formula based on Cr of 0.81). No results for input(s): VANCOTROUGH, VANCOPEAK, VANCORANDOM, GENTTROUGH, GENTPEAK, GENTRANDOM, TOBRATROUGH, TOBRAPEAK, TOBRARND, AMIKACINPEAK, AMIKACINTROU, AMIKACIN in the last 72 hours.   Microbiology: Recent Results (from the past 720 hour(s))  Blood Culture (routine x 2)     Status: None   Collection Time: 12/31/13  5:05 PM  Result Value Ref Range Status   Specimen Description BLOOD ARM RIGHT  Final   Special Requests BOTTLES DRAWN AEROBIC ONLY 5CC  Final   Culture  Setup Time   Final    01/01/2014 01:25 Performed at [place]   Final    NO GROWTH 5 DAYS Performed at [facility]    Report Status 01/07/2014 FINAL  Final  Urine culture     Status: None   Collection Time: 12/31/13  6:10 PM  Result Value Ref Range Status   Specimen Description URINE, RANDOM  Final   Special Requests NONE  Final   Culture  Setup Time   Final    01/01/2014  00:45 Performed at [place] Performed at [facility]   Final   Culture NO GROWTH Performed at [facility]   Final   Report Status 01/01/2014 FINAL  Final  Blood Culture (routine x 2)     Status: None   Collection Time: 12/31/13  6:31 PM  Result Value Ref Range Status   Specimen Description BLOOD ARM RIGHT  Final   Special Requests BOTTLES DRAWN AEROBIC AND ANAEROBIC [facility]  Final   Culture  Setup Time   Final    01/01/2014 01:25 Performed at [employer] [facility]   Final    NO GROWTH 5 DAYS Performed at [facility]    Report Status 01/07/2014 FINAL  Final    Medical History: Past Medical History  Diagnosis Date  . High cholesterol   . Hypertension   . Panic attack   . Acute respiratory distress syndrome (ARDS)   . Pneumonia   . COPD (chronic obstructive pulmonary disease)     Medications:  Prescriptions prior to admission  Medication Sig Dispense Refill Last Dose  . budesonide (PULMICORT) 0.5 MG/2ML nebulizer solution Take 2 mLs (0.5 mg total) by nebulization 2 (two) times daily. (Patient taking differently: Take 0.5 mg by nebulization every 6 (six) hours. )  12 01/22/2014 at Unknown time  . butalbital-acetaminophen-caffeine (FIORICET, ESGIC)  50-325-40 MG per tablet Take 1 tablet by mouth daily as needed. migraines  1 unknown  . citalopram (CELEXA) 10 MG tablet Take 1 tablet (10 mg total) by mouth daily. (Patient taking differently: Take 10 mg by mouth at bedtime. )   01/22/2014 at Unknown time  . clonazePAM (KLONOPIN) 0.5 MG tablet Take 1 tablet (0.5 mg total) by mouth 2 (two) times daily. (Patient taking differently: Take 0.5 mg by mouth at bedtime. ) 60 tablet 0 01/21/2014 at Unknown time  . cloNIDine (CATAPRES) 0.2 MG tablet Take 1 tablet (0.2 mg total) by mouth 3 (three) times daily.   01/22/2014 at Unknown time  . clopidogrel (PLAVIX) 75 MG tablet Take 1 tablet (75 mg total) by mouth daily. 30  tablet 0 01/22/2014 at Unknown time  . fentaNYL (DURAGESIC - DOSED MCG/HR) 50 MCG/HR Place 1 patch (50 mcg total) onto the skin every 3 (three) days. 5 patch 0 Past Week at Unknown time  . OXYGEN Inhale 5 L into the lungs continuous.   01/22/2014 at Unknown time  . simvastatin (ZOCOR) 40 MG tablet Take 40 mg by mouth daily at 6 PM.   01/21/2014 at Unknown time  . atorvastatin (LIPITOR) 80 MG tablet Take 1 tablet (80 mg total) by mouth daily. 30 tablet 0   . ipratropium-albuterol (DUONEB) 0.5-2.5 (3) MG/3ML SOLN Take 3 mLs by nebulization 4 (four) times daily. 360 mL  Past Week at Unknown time  . levofloxacin (LEVAQUIN) 750 MG tablet Take 1 tablet (750 mg total) by mouth daily. 4 tablet 0   . metoprolol tartrate (LOPRESSOR) 12.5 mg TABS tablet Take 0.5 tablets (12.5 mg total) by mouth 2 (two) times daily with a meal. 60 tablet 0    Assessment: 74 yo man recently hospitalized from 12/22-12/30 because of healthcare associated pneumonia.  He was readmitted 01/22/14 for weakness.  He will start broad spectrum antibiotics for PNA.    Goal of Therapy:  Vancomycin trough level 15-20 mcg/ml  Plan:  Vancomycin 1250 mg IV X 1 then 750 mg IV q12 hours Zosyn 3.375 gm IV q8 hours Monitor renal function, cultures and clinical course.  Thanks for allowing pharmacy to be a part of this patient's care.  Excell Seltzer, PharmD Clinical Pharmacist, [phone] 01/23/2014,1:42 PM

## 2014-01-23 NOTE — Progress Notes (Signed)
PROGRESS NOTE    Rick Church FYB:[id number] DOB: 03-Sep-1940 DOA: 01/22/2014 PCP: Antony Blackbird, MD  HPI/Brief narrative 74 year old male with history of hypertension, hyperlipidemia, panic attack, COPD, recently hospitalized 12/23-12/30 for HCAP and was discharged on home oxygen, completed course of levofloxacin, presented with worsening dyspnea, weakness, falls 2 at home. He states that home health nurse checked and his pulse rate was low. His niece advised him to come to the ED. He reports bradycardia and metoprolol was discontinued by PCP last week. Minimal dry cough. Although H&P stated slurred speech/difficulty speaking, patient denies this or any other strokelike symptoms. Work up in the ED demonstrates bradycardia with heart rate down to 39. Hypokalemia with potassium 3.0, slightly elevated troponin at 0.04, lactate 1.04, EKG showed prolonged QTc interval at 543, first degree AV block. Patient is admitted to inpatient for further evaluation and treatment.   Assessment/Plan:  1. Sinus bradycardia: Possibly secondary to medications. Metoprolol discontinued during recent PCP visit. Clonidine held since admission. Heart rates continue in the 40s on telemetry. Continue monitoring. Check TSH. 2. History of falls 2: Unclear etiology. Patient clearly denies LOC so not sure if sinus bradycardia was cause of fall. Check orthotic blood pressures. Continue monitoring on telemetry. CT head negative for acute findings. No focal deficits on exam. 3. Minimally elevated troponin: No reported chest pain. We'll check 2-D echo to evaluate LV function and if abnormal then consider cardiology consultation. 4. Hypokalemia: Replace and follow BMP. Check magnesium 5. Anemia: Follow CBCs. 6. Hypoglycemic blood sugar: On a labs 1/14. Not on hypoglycemics. Monitor CBGs closely. 7. History of panic attacks: Continue clonazepam and Celexa. 8. Essential hypertension: Mildly uncontrolled. Clonidine held. Metoprolol  discontinued OP. Started amlodipine. Monitor. 9. COPD/recent acute hypoxic respiratory failure: Chest x-ray suggests worsening right and new left base ASD.? HCAP. Will cover for pneumonia and titrate oxygen as tolerated. Trial of a dose of IV Lasix. 10. ?HCAP: IV vancomycin and Zosyn. 11. Hyperlipidemia: Continue statins. 12. Dysphagia: Patient failed bedside swallow screen by RN. Speech therapy consultation. 13. History of CVA: Continue Plavix. 14. Prolonged QTC: Replace potassium, magnesium as needed and follow EKG. Continue telemetry.   Code Status: Full Family Communication: None at bedside Disposition Plan: Home when medically stable   Consultants:  None  Procedures:  None  Antibiotics:  IV Zosyn  IV vancomycin   Subjective: States that he feels better. Denies dyspnea. Mild nonproductive cough. Feels stronger.  Objective: Filed Vitals:   01/23/14 0145 01/23/14 0149 01/23/14 0237 01/23/14 0935  BP:   170/59   Pulse:   53   Temp:   97.5 F (36.4 C)   TempSrc:   Oral   Resp:   20   Height:      Weight:      SpO2: 82% 93% 91% 65%    Intake/Output Summary (Last 24 hours) at 01/23/14 1234 Last data filed at 01/23/14 0859  Gross per 24 hour  Intake 624.67 ml  Output    500 ml  Net 124.67 ml   Filed Weights   01/22/14 1815 01/22/14 2333  Weight: 70.308 kg (155 lb) 74.481 kg (164 lb 3.2 oz)     Exam:  General exam: Pleasant elderly male lying comfortably propped up in bed. Respiratory system: Diminished breath sounds at the bases with scattered bibasal crackles. No increased work of breathing. Cardiovascular system: S1 & S2 heard, RRR. No JVD, murmurs, gallops, clicks. Trace bilateral ankle edema. Telemetry: Sinus bradycardia in the 40s Gastrointestinal system: Abdomen  is nondistended, soft and nontender. Normal bowel sounds heard. Central nervous system: Alert and oriented. No focal neurological deficits. Extremities: Symmetric 5 x 5 power.   Data  Reviewed: Basic Metabolic Panel:  Recent Labs Lab 01/22/14 1855 01/23/14 0755  NA 144 145  K 3.0* 3.4*  CL 106 105  CO2 28 27  GLUCOSE 92 90  BUN 9 10  CREATININE 0.85 0.81  CALCIUM 8.1* 8.0*   Liver Function Tests:  Recent Labs Lab 01/22/14 1855  AST 16  ALT 9  ALKPHOS 113  BILITOT 0.4  PROT 5.3*  ALBUMIN 2.3*   No results for input(s): LIPASE, AMYLASE in the last 168 hours. No results for input(s): AMMONIA in the last 168 hours. CBC:  Recent Labs Lab 01/22/14 1855 01/23/14 0755  WBC 7.8 8.0  NEUTROABS 5.7  --   HGB 10.5* 9.3*  HCT 32.9* 28.7*  MCV 90.6 90.5  PLT 269 246   Cardiac Enzymes:  Recent Labs Lab 01/22/14 1855 01/23/14 0113 01/23/14 0755  TROPONINI 0.04* 0.04* 0.04*   BNP (last 3 results)  Recent Labs  11/17/13 0400 11/24/13 0245  PROBNP 1388.0* 534.8*   CBG:  Recent Labs Lab 01/22/14 1859 01/23/14 0641 01/23/14 0710  GLUCAP 86 66* 104*    No results found for this or any previous visit (from the past 240 hour(s)).       Studies: Ct Head Wo Contrast  01/23/2014   CLINICAL DATA:  Status post fall, with slurred speech. Initial encounter.  EXAM: CT HEAD WITHOUT CONTRAST  TECHNIQUE: Contiguous axial images were obtained from the base of the skull through the vertex without intravenous contrast.  COMPARISON:  CT of the head performed 11/14/2013  FINDINGS: There is no evidence of acute infarction, mass lesion, or intra- or extra-axial hemorrhage on CT.  Prominence of the sulci suggest mild cortical volume loss. Mild cerebellar atrophy is noted. A chronic lacunar infarct is seen at the right pons. Mild periventricular white matter change likely reflects small vessel ischemic microangiopathy.  The brainstem and fourth ventricle are within normal limits. The basal ganglia are unremarkable in appearance. The cerebral hemispheres demonstrate grossly normal gray-white differentiation. No mass effect or midline shift is seen.  There is no  evidence of fracture; visualized osseous structures are unremarkable in appearance. There is incomplete fusion of the posterior arch of C1. The orbits are within normal limits. A mucus retention cyst or polyp is noted at the left maxillary sinus. The remaining paranasal sinuses and mastoid air cells are well-aerated. No significant soft tissue abnormalities are seen.  IMPRESSION: 1. No acute intracranial pathology seen on CT. 2. Mild cortical volume loss and scattered small vessel ischemic microangiopathy. 3. 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 4. Mucus retention cyst or polyp at the left maxillary sinus.   Electronically Signed   By: Garald Balding M.D.   On: 01/23/2014 04:13   Dg Chest Port 1 View  01/22/2014   CLINICAL DATA:  Weakness. Dizziness and shortness of breath for 1 day. Initial encounter.  EXAM: PORTABLE CHEST - 1 VIEW  COMPARISON:  12/31/2013  FINDINGS: Two frontal radiographs. Lower cervical spine fixation. Numerous leads and wires project over the chest. Cardiomegaly accentuated by AP portable technique. Small right pleural effusion is new or increased. There is likely a small layering left pleural effusion. No pneumothorax. Worsened right base airspace disease. New left base airspace disease.  IMPRESSION: Worsened right and developing left base airspace disease. This could represent atelectasis or infection.  New or increased small right pleural effusion. Suspected trace left pleural fluid.   Electronically Signed   By: Abigail Miyamoto M.D.   On: 01/22/2014 19:09        Scheduled Meds: . amLODipine  5 mg Oral Daily  . antiseptic oral rinse  7 mL Mouth Rinse BID  . budesonide  0.5 mg Nebulization BID  . citalopram  10 mg Oral Daily  . clonazePAM  0.5 mg Oral BID  . clopidogrel  75 mg Oral Daily  . fentaNYL  50 mcg Transdermal Q72H  . heparin  5,000 Units Subcutaneous 3 times per day  . ipratropium-albuterol  3 mL Nebulization TID  . simvastatin  40 mg Oral q1800  . sodium  chloride  3 mL Intravenous Q12H   Continuous Infusions: . sodium chloride 50 mL/hr at 01/22/14 2352    Principal Problem:   Fall Active Problems:   COPD with emphysema   Elevated troponin   Bradycardia   HLD (hyperlipidemia)   HTN (hypertension)   Panic attack   Weakness   Hypokalemia    Time spent: 45 minutes.    Vernell Leep, MD, FACP, FHM. [facility] Pager [phone]  If 7PM-7AM, please contact night-coverage [email or web address] Password [email or web address] 01/23/2014, 12:34 PM    LOS: 1 day

## 2014-01-23 NOTE — Evaluation (Signed)
Clinical/Bedside Swallow Evaluation Patient Details  Name: Rick Church MRN: [id number] Date of Birth: 12/15/1940  Today's Date: 01/23/2014 Time: 1215-1233 SLP Time Calculation (min) (ACUTE ONLY): 18 min  Past Medical History:  Past Medical History  Diagnosis Date  . High cholesterol   . Hypertension   . Panic attack   . Acute respiratory distress syndrome (ARDS)   . Pneumonia   . COPD (chronic obstructive pulmonary disease)   . Complication of anesthesia   . PONV (postoperative nausea and vomiting)   . History of hiatal hernia   . Headache   . Arthritis    Past Surgical History:  Past Surgical History  Procedure Laterality Date  . Foot neuroma surgery Left 09/29/2101  . Back surgery    . Tracheostomy      feinstein  . Left heart catheterization with coronary angiogram N/A 01/06/2014    Procedure: LEFT HEART CATHETERIZATION WITH CORONARY ANGIOGRAM;  Surgeon: Peter M Martinique, MD;  Location: [facility] CATH LAB;  Service: Cardiovascular;  Laterality: N/A;  . Tracheostomy closure  01/2014   HPI:  74 y.o. male with past medical history of hypertension, hyperlipidemia, panic attack, COPD, who presents with fall and bradycardia. Patient reports that in the past 2 weeks he has very mild slurred speech, and mild difficult speaking.  MRI no acute intracranial pathology seen on CT, mild cortical volume loss and scattered small vessel ischemic microangiopathy, chronic lacunar infarct at the right pon.Out patient MBS 12/27/13 revealed decreased laryngeal elevation/closure allowing trace aspiration of thin liquid *just below vocal folds x1 with reflexive subtle throat clear. Laryngeal penetration was worse with straw boluses but adequately cleared 90% with cued throat clear/cough. Residuals present. CXR worsened right and developing left base airspace disease. This could represent atelectasis or infection.   Assessment / Plan / Recommendation Clinical Impression  Pt did not demonstrate s/s  aspiration during bedside, however pt's with COPD are at a higher risk for silent aspiration due to shorter apneic period. Pt's CXR revealing possible infiltrates. SLP reviewed strategies recommended after MBS 01/07/14 (alternate solids/liquids, no straws, clear throat) with clinical reasoning and importance of consistent use. Recommend he continue regular texture and thin liquids, no straws, pills whole in applesauce. SLP will follow and if silent aspiration is suspected may recommend MBS.    Aspiration Risk  Moderate    Diet Recommendation Regular;Thin liquid   Liquid Administration via: Cup;No straw Medication Administration: Whole meds with puree Supervision: Patient able to self feed;Intermittent supervision to cue for compensatory strategies Compensations: Slow rate;Small sips/bites;Clear throat intermittently;Follow solids with liquid Postural Changes and/or Swallow Maneuvers: Seated upright 90 degrees;Upright 30-60 min after meal    Other  Recommendations Oral Care Recommendations: Oral care BID   Follow Up Recommendations   (TBD)    Frequency and Duration min 2x/week  2 weeks   Pertinent Vitals/Pain none         Swallow Study Prior Functional Status  Type of Home: House Available Help at Discharge: Family       Oral/Motor/Sensory Function Overall Oral Motor/Sensory Function: Appears within functional limits for tasks assessed   Ice Chips Ice chips: Not tested   Thin Liquid Thin Liquid: Within functional limits Presentation: Cup Oral Phase Impairments:  (none)    Nectar Thick Nectar Thick Liquid: Not tested   Honey Thick Honey Thick Liquid: Not tested   Puree Puree: Within functional limits   Solid   GO    Solid: Within functional limits  Houston Siren 01/23/2014,1:48 PM  Orbie Pyo Colvin Caroli.Ed [employer] [phone]

## 2014-01-23 NOTE — Progress Notes (Signed)
Called per floor RN at [place] for patient complaining of  SOB and low 02 sats on 6 LNC. Advised to place pt on NRB while RRT RN en route. Upon my arrival to bedside at 0150 pt found resting quietly in bed denies pain, denies SOB on NRB 02 sats 96-100% RR 15-18. Lung sound diminished at bases, crackles heard in left upper lob and base. Per RN lung sounds unchanged from her initial assessement. COPD pt on home 02 4-5 L [place]. Weaned to 55% VM with 02 sats 94-96%. Pt left resting in bed appears comfortable RR 18. RN awaiting call back from [facility] to update and request 02 parameters to monitor for tonight.

## 2014-01-23 NOTE — Progress Notes (Signed)
Patient called RN stating he felt short of breath.  Repositioned patient in bed, patient stated, "that feels a little better".  Oxygen saturation 82% on 6L via nasal cannula.  Rapid response RN called, patient placed on non-rebreather.  Oxygen saturation 89-94% on non-rebreather.  Patient's lung sounds unchanged bilaterally.  NP on call notified of patient's condition.  Patient weaned to venturi mask with Rapid Response RN present.  Patient placed on continuous pulse ox.  Saturations 89%-96% on venturi mask.  Order placed by Baltazar Najjar, NP to maintain saturation at or above 89%.  Will continue to monitor.

## 2014-01-23 NOTE — Progress Notes (Signed)
Dr. Blaine Hamper ordered pacemaker to bedside.  Clarified order; per Dr. Blaine Hamper, as long as patient's HR remains stable (currently in 63s), disregard order.  Dr. Blaine Hamper ordered CBGs q4 while patient NPO.  Order updated by this RN.  Will continue to monitor.

## 2014-01-23 NOTE — Evaluation (Signed)
Physical Therapy Evaluation Patient Details Name: Rick Church MRN: [id number] DOB: [birth year] Today's Date: 01/23/2014   History of Present Illness  Pt adm with fall and bradycardia. Pt with recent hospitalizations for PNA. This fall pt with extended hospitalization for VDRF, trached and placed in SNF. PMH - COPD, HTN  Clinical Impression  Pt admitted with above diagnosis. Pt currently with functional limitations due to the deficits listed below (see PT Problem List).  Pt will benefit from skilled PT to increase their independence and safety with mobility to allow discharge to the venue listed below.  If pt's medical condition allows feel pt could manage at home with intermittent assist of family.     Follow Up Recommendations Home health PT (if pt's medical status allows.)    Equipment Recommendations  None recommended by PT    Recommendations for Other Services       Precautions / Restrictions Precautions Precautions: Fall Precaution Comments: watch sats      Mobility  Bed Mobility Overal bed mobility: Modified Independent             General bed mobility comments: Pt uses rocking momentum to bring trunk up.  Transfers Overall transfer level: Needs assistance Equipment used: Rolling walker (2 wheeled) Transfers: Sit to/from Stand Sit to Stand: Min assist;Min guard         General transfer comment: Assist to bring hips up from low toilet.  Ambulation/Gait Ambulation/Gait assistance: Min guard Ambulation Distance (Feet): 20 Feet Assistive device: Rolling walker (2 wheeled);None Gait Pattern/deviations: Step-through pattern;Decreased step length - right;Decreased step length - left     General Gait Details: Pt slightly unsteady but no loss of balance.  Stairs            Wheelchair Mobility    Modified Rankin (Stroke Patients Only)       Balance Overall balance assessment: Needs assistance;History of Falls Sitting-balance support: No upper  extremity supported;Feet supported Sitting balance-Leahy Scale: Good     Standing balance support: No upper extremity supported;During functional activity Standing balance-Leahy Scale: Fair                               Pertinent Vitals/Pain Pain Assessment: No/denies pain    Home Living Family/patient expects to be discharged to:: Private residence Living Arrangements: Other relatives (brother and sister in [place]) Available Help at Discharge: Family Type of Home: House Home Access: Stairs to enter Entrance Stairs-Rails: Right;Left;Can reach both Entrance Stairs-Number of Steps: 2 Home Layout: One level Home Equipment: Environmental consultant - 2 wheels;Cane - single point;Shower seat;Walker - 4 wheels      Prior Function Level of Independence: Independent with assistive device(s);Needs assistance   Gait / Transfers Assistance Needed: Uses cane or walker for assistance           Hand Dominance   Dominant Hand: Right    Extremity/Trunk Assessment   Upper Extremity Assessment: Defer to OT evaluation           Lower Extremity Assessment: Generalized weakness         Communication   Communication: No difficulties  Cognition Arousal/Alertness: Awake/alert Behavior During Therapy: WFL for tasks assessed/performed Overall Cognitive Status: Within Functional Limits for tasks assessed                      General Comments      Exercises        Assessment/Plan  PT Assessment Patient needs continued PT services  PT Diagnosis Difficulty walking;Generalized weakness   PT Problem List Decreased strength;Decreased activity tolerance;Decreased balance;Decreased mobility;Cardiopulmonary status limiting activity  PT Treatment Interventions DME instruction;Gait training;Functional mobility training;Therapeutic activities;Therapeutic exercise;Balance training;Patient/family education   PT Goals (Current goals can be found in the Care Plan section) Acute Rehab  PT Goals Patient Stated Goal: Return home PT Goal Formulation: With patient Time For Goal Achievement: 01/30/14 Potential to Achieve Goals: Good    Frequency Min 3X/week   Barriers to discharge Decreased caregiver support      Co-evaluation               End of Session Equipment Utilized During Treatment: Oxygen Activity Tolerance: Patient limited by fatigue Patient left: in chair;with call bell/phone within reach;with chair alarm set Nurse Communication: Mobility status         Time: 6546-5035 PT Time Calculation (min) (ACUTE ONLY): 37 min   Charges:   PT Evaluation $Initial PT Evaluation Tier I: 1 Procedure PT Treatments $Gait Training: 23-37 mins   PT G Codes:        Hamsa Laurich 02/18/14, 12:11 PM  [employer] PT [phone]

## 2014-01-23 NOTE — Progress Notes (Signed)
Hypoglycemic Event  CBG: 66  Treatment: D50 IV 25 mL  Symptoms: None  Follow-up CBG: Time: 0710 CBG Result: 104  Possible Reasons for Event: Unknown  Comments/MD notified: Dr. Oleta Mouse notified.    Rick Church C  Remember to initiate Hypoglycemia Order Set & complete

## 2014-01-23 NOTE — Progress Notes (Signed)
Echocardiogram 2D Echocardiogram has been performed.  Joelene Millin 01/23/2014, 2:20 PM

## 2014-01-23 NOTE — Care Management Note (Signed)
    Page 1 of 1   01/26/2014     8:08:03 AM CARE MANAGEMENT NOTE 01/26/2014  Patient:  Rick Church, Rick Church   Account Number:  [id number]  Date Initiated:  01/23/2014  Documentation initiated by:  AMERSON,JULIE  Subjective/Objective Assessment:   Pt adm on 1/01/12/14 with SOB, weakness, fall x 2.  PTA, pt resided home alone and is independent.  He is on chronic home oxygen, and is active with [facility] for [facility] and HHPT.  Pt states he has family that assists him as needed.     Action/Plan:   PT recommending HHHPT at discharge.  Will need resumption of [place] care orders prior to dc home.  Will cont to follow for additional home needs.   Anticipated DC Date:  01/25/2014   Anticipated DC Plan:  [place]  CM consult      Choice offered to / List presented to:          [facility] arranged  [place].   Status of service:  Completed, signed off Medicare Important Message given?   (If response is "NO", the following Medicare IM given date fields will be blank) Date Medicare IM given:   Medicare IM given by:   Date Additional Medicare IM given:   Additional Medicare IM given by:    Discharge Disposition:  GROUP HOME  Per UR Regulation:  Reviewed for med. necessity/level of care/duration of stay  If discussed at [place] of Stay Meetings, dates discussed:    Comments:  01/25/14/ 16:00 CM met with pt in room to offer choice of home health agency.  Pt chooses [facility] to render HHPT. Address and contact information verified with pt.  Referral called to [facility] to render HHPT. No other CM needs were communicated. Mariane Masters, BSN, CM [phone].

## 2014-01-24 ENCOUNTER — Inpatient Hospital Stay (HOSPITAL_COMMUNITY): Payer: Medicare Other

## 2014-01-24 ENCOUNTER — Other Ambulatory Visit: Payer: Self-pay

## 2014-01-24 LAB — GLUCOSE, CAPILLARY
GLUCOSE-CAPILLARY: 100 mg/dL — AB (ref 70–99)
GLUCOSE-CAPILLARY: 116 mg/dL — AB (ref 70–99)
Glucose-Capillary: 75 mg/dL (ref 70–99)
Glucose-Capillary: 93 mg/dL (ref 70–99)
Glucose-Capillary: 93 mg/dL (ref 70–99)
Glucose-Capillary: 98 mg/dL (ref 70–99)

## 2014-01-24 LAB — CBC
HEMATOCRIT: 30.9 % — AB (ref 39.0–52.0)
HEMOGLOBIN: 9.6 g/dL — AB (ref 13.0–17.0)
MCH: 28.9 pg (ref 26.0–34.0)
MCHC: 31.1 g/dL (ref 30.0–36.0)
MCV: 93.1 fL (ref 78.0–100.0)
Platelets: 257 10*3/uL (ref 150–400)
RBC: 3.32 MIL/uL — ABNORMAL LOW (ref 4.22–5.81)
RDW: 17.2 % — ABNORMAL HIGH (ref 11.5–15.5)
WBC: 8.5 10*3/uL (ref 4.0–10.5)

## 2014-01-24 LAB — BASIC METABOLIC PANEL
Anion gap: 6 (ref 5–15)
BUN: 10 mg/dL (ref 6–23)
CALCIUM: 7.9 mg/dL — AB (ref 8.4–10.5)
CO2: 30 mmol/L (ref 19–32)
Chloride: 107 mEq/L (ref 96–112)
Creatinine, Ser: 0.94 mg/dL (ref 0.50–1.35)
GFR calc Af Amer: >90 mL/min (ref >90)
GFR calc non Af Amer: 82 mL/min — ABNORMAL LOW (ref >90)
Glucose, Bld: 82 mg/dL (ref 70–99)
Potassium: 3.6 mmol/L (ref 3.5–5.1)
Sodium: 143 mmol/L (ref 135–145)

## 2014-01-24 MED ORDER — ONDANSETRON HCL 4 MG/2ML IJ SOLN
4.0000 mg | Freq: Three times a day (TID) | INTRAMUSCULAR | Status: DC | PRN
  Administered 2014-01-24: 4 mg via INTRAVENOUS
  Filled 2014-01-24: qty 2

## 2014-01-24 MED ORDER — POTASSIUM CHLORIDE CRYS ER 20 MEQ PO TBCR
40.0000 meq | EXTENDED_RELEASE_TABLET | Freq: Once | ORAL | Status: AC
  Administered 2014-01-24: 40 meq via ORAL
  Filled 2014-01-24: qty 2

## 2014-01-24 MED ORDER — TRAMADOL HCL 50 MG PO TABS
50.0000 mg | ORAL_TABLET | Freq: Four times a day (QID) | ORAL | Status: DC | PRN
  Administered 2014-01-24 – 2014-01-25 (×3): 50 mg via ORAL
  Filled 2014-01-24 (×3): qty 1

## 2014-01-24 MED ORDER — MAGNESIUM SULFATE 2 GM/50ML IV SOLN
2.0000 g | Freq: Once | INTRAVENOUS | Status: AC
  Administered 2014-01-24: 2 g via INTRAVENOUS
  Filled 2014-01-24: qty 50

## 2014-01-24 NOTE — Progress Notes (Signed)
Physical Therapy Treatment Patient Details Name: Rick Church MRN: [id number] DOB: [birth year] Today's Date: 02-11-14    History of Present Illness Pt adm with fall and bradycardia. Pt with recent hospitalizations for PNA. This fall pt with extended hospitalization for VDRF, trached and placed in SNF. PMH - COPD, HTN    PT Comments    Pt making good progress with mobility.  Follow Up Recommendations  Home health PT     Equipment Recommendations  None recommended by PT    Recommendations for Other Services       Precautions / Restrictions Precautions Precautions: Fall    Mobility  Bed Mobility Overal bed mobility: Modified Independent Bed Mobility: Supine to Sit;Sit to Supine     Supine to sit: Modified independent (Device/Increase time) Sit to supine: Modified independent (Device/Increase time)      Transfers Overall transfer level: Modified independent Equipment used: Rolling walker (2 wheeled) Transfers: Sit to/from Stand Sit to Stand: Modified independent (Device/Increase time)            Ambulation/Gait Ambulation/Gait assistance: Supervision Ambulation Distance (Feet): 325 Feet Assistive device: Rolling walker (2 wheeled) Gait Pattern/deviations: Step-through pattern;Decreased step length - right;Decreased step length - left   Gait velocity interpretation: at or above normal speed for age/gender General Gait Details: SaO2 monitor with inconsistent readings when pt moving. When pausing SaO2 readings between 86-92 on 4L. Pt conversant throughout walk with dyspnea 2/4.   Stairs            Wheelchair Mobility    Modified Rankin (Stroke Patients Only)       Balance   Sitting-balance support: No upper extremity supported;Feet supported Sitting balance-Leahy Scale: Good     Standing balance support: No upper extremity supported;During functional activity Standing balance-Leahy Scale: Good                      Cognition  Arousal/Alertness: Awake/alert Behavior During Therapy: WFL for tasks assessed/performed Overall Cognitive Status: Within Functional Limits for tasks assessed                      Exercises      General Comments        Pertinent Vitals/Pain Faces Pain Scale: Hurts a little bit Pain Location: rt knee Pain Descriptors / Indicators: Sore Pain Intervention(s): Limited activity within patient's tolerance    Home Living                      Prior Function            PT Goals (current goals can now be found in the care plan section) Progress towards PT goals: Progressing toward goals    Frequency  Min 3X/week    PT Plan Current plan remains appropriate    Co-evaluation             End of Session Equipment Utilized During Treatment: Oxygen Activity Tolerance: Patient tolerated treatment well Patient left: in bed;with call bell/phone within reach;with family/visitor present (transporter present to transport to MBS.)     Time: 1517-6160 PT Time Calculation (min) (ACUTE ONLY): 15 min  Charges:  $Gait Training: 8-22 mins                    G Codes:      Gearline Spilman 2014-02-11, 1:48 PM  [employer] PT [phone]

## 2014-01-24 NOTE — Progress Notes (Signed)
OT Cancellation Note  Patient Details Name: Rick Church MRN: [id number] DOB: 22-Mar-1940   Cancelled Treatment:    Reason Eval/Treat Not Completed: Patient declined, vomited this morning.  Not ready to get up.  Will continue to follow.  [name] 01/24/2014, 8:53 AM

## 2014-01-24 NOTE — Procedures (Addendum)
Objective Swallowing Evaluation: Modified Barium Swallowing Study  Patient Details  Name: Rick Church MRN: [id number] Date of Birth: Jul 29, 1940  Today's Date: 01/24/2014 Time: SLP Start Time (ACUTE ONLY): 1355-SLP Stop Time (ACUTE ONLY): 1415 SLP Time Calculation (min) (ACUTE ONLY): 20 min  Past Medical History:  Past Medical History  Diagnosis Date  . High cholesterol   . Hypertension   . Panic attack   . Acute respiratory distress syndrome (ARDS)   . Pneumonia   . COPD (chronic obstructive pulmonary disease)   . Complication of anesthesia   . PONV (postoperative nausea and vomiting)   . History of hiatal hernia   . Headache   . Arthritis    Past Surgical History:  Past Surgical History  Procedure Laterality Date  . Foot neuroma surgery Left 09/29/2101  . Back surgery    . Tracheostomy      feinstein  . Left heart catheterization with coronary angiogram N/A 01/06/2014    Procedure: LEFT HEART CATHETERIZATION WITH CORONARY ANGIOGRAM;  Surgeon: Peter M Martinique, MD;  Location: [facility] CATH LAB;  Service: Cardiovascular;  Laterality: N/A;  . Tracheostomy closure     HPI:  HPI: 74 y.o. male with past medical history of hypertension, hyperlipidemia, panic attack, COPD, who presents with fall and bradycardia. Patient reports that in the past 2 weeks he has very mild slurred speech, and mild difficult speaking.  MRI no acute intracranial pathology seen on CT, mild cortical volume loss and scattered small vessel ischemic microangiopathy, chronic lacunar infarct at the right pon.Out patient MBS 12/27/13 revealed decreased laryngeal elevation/closure allowing trace aspiration of thin liquid *just below vocal folds x1 with reflexive subtle throat clear. Laryngeal penetration was worse with straw boluses but adequately cleared 90% with cued throat clear/cough. Residuals present. CXR worsened right and developing left base airspace disease. This could represent atelectasis or infection. SLP  recommends MBS to fully assess pharyngeal phases swallow function.   No Data Recorded  Assessment / Plan / Recommendation CHL IP CLINICAL IMPRESSIONS 01/24/2014  Dysphagia Diagnosis Mild pharyngeal phase dysphagia  Clinical impression Overall, pt exhibited functional oral and pharyngeal phases with min/trace laryngeal penetration with barium during pill. Penetration likely due to decreased oral cohesion and containment of two varying consistencies simultaneously. Trace and flash penetration with majority of thin trials with complete clearance during the swallow. Recommend he continue regular texture and thin liquids. Educated pt re: recommendation for consuming pills whole in applesauce versus thin.       CHL IP TREATMENT RECOMMENDATION 01/24/2014  Treatment Plan Recommendations (No Data)     CHL IP DIET RECOMMENDATION 01/24/2014  Diet Recommendations Regular;Thin liquid  Liquid Administration via Cup;Straw  Medication Administration Whole meds with puree  Compensations Slow rate;Small sips/bites  Postural Changes and/or Swallow Maneuvers Seated upright 90 degrees;Upright 30-60 min after meal     CHL IP OTHER RECOMMENDATIONS 01/24/2014  Recommended Consults (None)  Oral Care Recommendations Oral care BID  Other Recommendations (None)     CHL IP FOLLOW UP RECOMMENDATIONS 01/24/2014  Follow up Recommendations None     CHL IP FREQUENCY AND DURATION 01/23/2014  Speech Therapy Frequency (ACUTE ONLY) min 2x/week  Treatment Duration 2 weeks     Pertinent Vitals/Pain none    SLP Swallow Goals No flowsheet data found.  No flowsheet data found.           CHL IP PHARYNGEAL PHASE 01/24/2014  Pharyngeal Phase Impaired  Pharyngeal - Pudding Teaspoon (None)  Penetration/Aspiration details (pudding teaspoon) (None)  Pharyngeal - Pudding Cup (None)  Penetration/Aspiration details (pudding cup) (None)  Pharyngeal - Honey Teaspoon (None)  Penetration/Aspiration details (honey teaspoon) (None)   Pharyngeal - Honey Cup (None)  Penetration/Aspiration details (honey cup) (None)  Pharyngeal - Honey Syringe (None)  Penetration/Aspiration details (honey syringe) (None)  Pharyngeal - Nectar Teaspoon (None)  Penetration/Aspiration details (nectar teaspoon) (None)  Pharyngeal - Nectar Cup (None)  Penetration/Aspiration details (nectar cup) (None)  Pharyngeal - Nectar Straw (None)  Penetration/Aspiration details (nectar straw) (None)  Pharyngeal - Nectar Syringe (None)  Penetration/Aspiration details (nectar syringe) (None)  Pharyngeal - Ice Chips (None)  Penetration/Aspiration details (ice chips) (None)  Pharyngeal - Thin Teaspoon (None)  Penetration/Aspiration details (thin teaspoon) (None)  Pharyngeal - Thin Cup Penetration/Aspiration during swallow  Penetration/Aspiration details (thin cup) Material enters airway, remains ABOVE vocal cords then ejected out  Pharyngeal - Thin Straw (None)  Penetration/Aspiration details (thin straw) (None)  Pharyngeal - Thin Syringe (None)  Penetration/Aspiration details (thin syringe') (None)  Pharyngeal - Puree (None)  Penetration/Aspiration details (puree) (None)  Pharyngeal - Mechanical Soft (None)  Penetration/Aspiration details (mechanical soft) (None)  Pharyngeal - Regular Pharyngeal residue - valleculae;Reduced tongue base retraction  Penetration/Aspiration details (regular) (None)  Pharyngeal - Multi-consistency (None)  Penetration/Aspiration details (multi-consistency) (None)  Pharyngeal - Pill Penetration/Aspiration during swallow  Penetration/Aspiration details (pill) Material enters airway, remains ABOVE vocal cords and not ejected out  Pharyngeal Comment (None)                Houston Siren 01/24/2014, 3:05 PM   Orbie Pyo Roland Lipke M.Ed [employer] [phone]

## 2014-01-24 NOTE — Progress Notes (Signed)
Speech Language Pathology Treatment: Dysphagia  Patient Details Name: Rick Church MRN: [id number] DOB: [birth year] Today's Date: 01/24/2014 Time: 0905-0920 SLP Time Calculation (min) (ACUTE ONLY): 15 min  Assessment / Plan / Recommendation Clinical Impression  Pt complained of nausea last night after eating sub from [employer]. Continues to exhibit evidence of possible pharyngeal dysphagia present following thin and solid. Verbal and clinical reasoning given for no talking immediately after bites/sips. Given clinical observations, history COPD recommend MBS (scheduled for 1300 today).   HPI HPI: 74 y.o. male with past medical history of hypertension, hyperlipidemia, panic attack, COPD, who presents with fall and bradycardia. Patient reports that in the past 2 weeks he has very mild slurred speech, and mild difficult speaking.  MRI no acute intracranial pathology seen on CT, mild cortical volume loss and scattered small vessel ischemic microangiopathy, chronic lacunar infarct at the right pon.Out patient MBS 12/27/13 revealed decreased laryngeal elevation/closure allowing trace aspiration of thin liquid *just below vocal folds x1 with reflexive subtle throat clear. Laryngeal penetration was worse with straw boluses but adequately cleared 90% with cued throat clear/cough. Residuals present. CXR worsened right and developing left base airspace disease. This could represent atelectasis or infection.   Pertinent Vitals Pain Assessment: No/denies pain  SLP Plan  MBS;Continue with current plan of care    Recommendations Diet recommendations: Regular;Thin liquid Liquids provided via: Cup;No straw Medication Administration: Whole meds with puree Supervision: Patient able to self feed;Intermittent supervision to cue for compensatory strategies Compensations: Slow rate;Small sips/bites;Clear throat intermittently;Follow solids with liquid Postural Changes and/or Swallow Maneuvers: Seated upright 90  degrees;Upright 30-60 min after meal              Oral Care Recommendations: Oral care BID Follow up Recommendations:  (TBD) Plan: MBS;Continue with current plan of care    GO     Houston Siren 01/24/2014, 9:26 AM   Orbie Pyo Colvin Caroli.Ed [employer] [phone]

## 2014-01-24 NOTE — Progress Notes (Addendum)
PROGRESS NOTE    Rick Church INO:[id number] DOB: 11/22/1940 DOA: 01/22/2014 PCP: Antony Blackbird, MD  HPI/Brief narrative 74 year old male with history of hypertension, hyperlipidemia, panic attack, COPD, recently hospitalized 12/23-12/30 for HCAP and was discharged on home oxygen, completed course of levofloxacin, presented with worsening dyspnea, weakness, falls 2 at home. He states that home health nurse checked and his pulse rate was low. His niece advised him to come to the ED. He reports bradycardia and metoprolol was discontinued by PCP last week. Minimal dry cough. Although H&P stated slurred speech/difficulty speaking, patient denies this or any other strokelike symptoms. Work up in the ED demonstrates bradycardia with heart rate down to 39. Hypokalemia with potassium 3.0, slightly elevated troponin at 0.04, lactate 1.04, EKG showed prolonged QTc interval at 543, first degree AV block. Patient is admitted to inpatient for further evaluation and treatment.   Assessment/Plan:  1. Sinus bradycardia: Possibly secondary to medications. Metoprolol discontinued during recent PCP visit. Clonidine held since admission. Heart rates continue in the 50s on telemetry. Continue monitoring. TSH 1.309. 2. History of falls 2: Unclear etiology. Patient clearly denies LOC so not sure if sinus bradycardia was cause of fall. Check orthotic blood pressures-negative. Continue monitoring on telemetry. CT head negative for acute findings. No focal deficits on exam. 3. Minimally elevated troponin: No reported chest pain. 2-D echo: LVEF 60-65 percent, normal wall motion and no regional wall motion abnormalities, grade 1 diastolic dysfunction mild AS. 4. Hypokalemia: Replace and follow BMP. Check magnesium 1.7 5. Anemia: Stable 6. Hypoglycemic blood sugar: On a labs 1/14. Not on hypoglycemics. Monitor CBGs closely. No further episodes. 7. History of panic attacks: Continue clonazepam and Celexa. 8. Essential  hypertension: controlled. Clonidine held. Metoprolol discontinued OP. Started amlodipine. Monitor. 9. COPD/recent acute hypoxic respiratory failure: Chest x-ray suggests worsening right and new left base ASD.? HCAP. Will cover for pneumonia and titrate oxygen as tolerated. S/p Trial of a dose of IV Lasix. Follow chest x-ray in a.m. 10. ?HCAP: IV vancomycin and Zosyn. Will transition to oral Abx in AM. ? Bactrim 11. Hyperlipidemia: Continue statins. 12. Dysphagia: As per speech therapy evaluation, regular consistency diet and thin liquids. 13. History of CVA: Continue Plavix. 14. Prolonged QTC: Replace potassium, magnesium as needed and follow EKG. Continue telemetry. EKG 1/14 had QTC of 521 ms.   Code Status: Full Family Communication: None at bedside Disposition Plan: Home when medically stable   Consultants:  None  Procedures:  None  Antibiotics:  IV Zosyn-DC'd  IV vancomycin -DC'd  Oral Ceftin  Subjective: Denies complaints.   Objective: Filed Vitals:   01/24/14 1522 01/24/14 1542 01/24/14 1543 01/24/14 1545  BP:  131/71    Pulse:  63 64 68  Temp:  97.9 F (36.6 C)    TempSrc:  Oral    Resp:  20    Height:      Weight:      SpO2: 96%       Intake/Output Summary (Last 24 hours) at 01/24/14 1553 Last data filed at 01/24/14 1240  Gross per 24 hour  Intake   1550 ml  Output   2550 ml  Net  -1000 ml   Filed Weights   01/22/14 1815 01/22/14 2333 01/24/14 0650  Weight: 70.308 kg (155 lb) 74.481 kg (164 lb 3.2 oz) 74.526 kg (164 lb 4.8 oz)     Exam:  General exam: Pleasant elderly male lying comfortably propped up in bed. Respiratory system: Diminished breath sounds at the bases  with scattered bibasal crackles. No increased work of breathing. Cardiovascular system: S1 & S2 heard, RRR. No JVD, murmurs, gallops, clicks. No pedal edema. Telemetry: Sinus bradycardia mostly in the 50s-sinus rhythm in the 60s.  Gastrointestinal system: Abdomen is nondistended,  soft and nontender. Normal bowel sounds heard. Central nervous system: Alert and oriented. No focal neurological deficits. Extremities: Symmetric 5 x 5 power.   Data Reviewed: Basic Metabolic Panel:  Recent Labs Lab 01/22/14 1855 01/23/14 0755 01/23/14 1317 01/24/14 0502  NA 144 145  --  143  K 3.0* 3.4*  --  3.6  CL 106 105  --  107  CO2 28 27  --  30  GLUCOSE 92 90  --  82  BUN 9 10  --  10  CREATININE 0.85 0.81  --  0.94  CALCIUM 8.1* 8.0*  --  7.9*  MG  --   --  1.7  --    Liver Function Tests:  Recent Labs Lab 01/22/14 1855  AST 16  ALT 9  ALKPHOS 113  BILITOT 0.4  PROT 5.3*  ALBUMIN 2.3*   No results for input(s): LIPASE, AMYLASE in the last 168 hours. No results for input(s): AMMONIA in the last 168 hours. CBC:  Recent Labs Lab 01/22/14 1855 01/23/14 0755 01/24/14 0502  WBC 7.8 8.0 8.5  NEUTROABS 5.7  --   --   HGB 10.5* 9.3* 9.6*  HCT 32.9* 28.7* 30.9*  MCV 90.6 90.5 93.1  PLT 269 246 257   Cardiac Enzymes:  Recent Labs Lab 01/22/14 1855 01/23/14 0113 01/23/14 0755 01/23/14 1235  TROPONINI 0.04* 0.04* 0.04* 0.03   BNP (last 3 results)  Recent Labs  11/17/13 0400 11/24/13 0245  PROBNP 1388.0* 534.8*   CBG:  Recent Labs Lab 01/23/14 2241 01/24/14 0051 01/24/14 0530 01/24/14 0833 01/24/14 1234  GLUCAP 110* 98 100* 75 93    No results found for this or any previous visit (from the past 240 hour(s)).       Studies: Ct Head Wo Contrast  01/23/2014   CLINICAL DATA:  Status post fall, with slurred speech. Initial encounter.  EXAM: CT HEAD WITHOUT CONTRAST  TECHNIQUE: Contiguous axial images were obtained from the base of the skull through the vertex without intravenous contrast.  COMPARISON:  CT of the head performed 11/14/2013  FINDINGS: There is no evidence of acute infarction, mass lesion, or intra- or extra-axial hemorrhage on CT.  Prominence of the sulci suggest mild cortical volume loss. Mild cerebellar atrophy is noted.  A chronic lacunar infarct is seen at the right pons. Mild periventricular white matter change likely reflects small vessel ischemic microangiopathy.  The brainstem and fourth ventricle are within normal limits. The basal ganglia are unremarkable in appearance. The cerebral hemispheres demonstrate grossly normal gray-white differentiation. No mass effect or midline shift is seen.  There is no evidence of fracture; visualized osseous structures are unremarkable in appearance. There is incomplete fusion of the posterior arch of C1. The orbits are within normal limits. A mucus retention cyst or polyp is noted at the left maxillary sinus. The remaining paranasal sinuses and mastoid air cells are well-aerated. No significant soft tissue abnormalities are seen.  IMPRESSION: 1. No acute intracranial pathology seen on CT. 2. Mild cortical volume loss and scattered small vessel ischemic microangiopathy. 3. 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 4. Mucus retention cyst or polyp at the left maxillary sinus.   Electronically Signed   By: Garald Balding M.D.   On:  01/23/2014 04:13   Dg Chest Port 1 View  01/22/2014   CLINICAL DATA:  Weakness. Dizziness and shortness of breath for 1 day. Initial encounter.  EXAM: PORTABLE CHEST - 1 VIEW  COMPARISON:  12/31/2013  FINDINGS: Two frontal radiographs. Lower cervical spine fixation. Numerous leads and wires project over the chest. Cardiomegaly accentuated by AP portable technique. Small right pleural effusion is new or increased. There is likely a small layering left pleural effusion. No pneumothorax. Worsened right base airspace disease. New left base airspace disease.  IMPRESSION: Worsened right and developing left base airspace disease. This could represent atelectasis or infection.  New or increased small right pleural effusion. Suspected trace left pleural fluid.   Electronically Signed   By: Abigail Miyamoto M.D.   On: 01/22/2014 19:09   Dg Swallowing Func-speech  Pathology  01/24/2014   Orbie Pyo [place], CCC-SLP     01/24/2014  3:09 PM  Objective Swallowing Evaluation: Modified Barium Swallowing Study   Patient Details  Name: SAHARSH STERLING MRN: [id number] Date of Birth: Apr 11, 1940  Today's Date: 01/24/2014 Time: SLP Start Time (ACUTE ONLY): 1355-SLP Stop Time (ACUTE  ONLY): 1415 SLP Time Calculation (min) (ACUTE ONLY): 20 min  Past Medical History:  Past Medical History  Diagnosis Date  . High cholesterol   . Hypertension   . Panic attack   . Acute respiratory distress syndrome (ARDS)   . Pneumonia   . COPD (chronic obstructive pulmonary disease)   . Complication of anesthesia   . PONV (postoperative nausea and vomiting)   . History of hiatal hernia   . Headache   . Arthritis    Past Surgical History:  Past Surgical History  Procedure Laterality Date  . Foot neuroma surgery Left 09/29/2101  . Back surgery    . Tracheostomy      feinstein  . Left heart catheterization with coronary angiogram N/A  01/06/2014    Procedure: LEFT HEART CATHETERIZATION WITH CORONARY ANGIOGRAM;   Surgeon: Peter M Martinique, MD;  Location: [facility] CATH LAB;  Service:  Cardiovascular;  Laterality: N/A;  . Tracheostomy closure     HPI:  HPI: 74 y.o. male with past medical history of hypertension,  hyperlipidemia, panic attack, COPD, who presents with fall and  bradycardia. Patient reports that in the past 2 weeks he has very  mild slurred speech, and mild difficult speaking.  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 Laryngeal penetration was worse with straw boluses  but adequately cleared 90% with cued throat clear/cough.  Residuals present. CXR worsened right and developing left base  airspace disease.  This could represent atelectasis or infection.  SLP recommends MBS to fully assess pharyngeal phases swallow  function.   No Data Recorded  Assessment / Plan / Recommendation CHL IP CLINICAL IMPRESSIONS 01/24/2014  Dysphagia Diagnosis Mild pharyngeal phase dysphagia Clinical  impression Overall, pt exhibited functional oral and pharyngeal  phases with min/trace laryngeal penetration with barium during  pill. Penetration likely due to decreased oral cohesion and  containment of two varying consistencies simultaneously. Trace  and flash penetration with majority of thin trials with complete  clearance during the swallow. Recommend he continue regular  texture and thin liquids. Educated pt re: recommendation for  consuming pills whole in applesauce versus thin.       CHL IP TREATMENT RECOMMENDATION 01/24/2014  Treatment Plan Recommendations (No Data)     CHL IP DIET RECOMMENDATION 01/24/2014  Diet Recommendations Regular;Thin liquid  Liquid Administration via Cup;Straw  Medication Administration Whole meds with puree  Compensations Slow rate;Small sips/bites  Postural Changes and/or Swallow Maneuvers Seated upright 90  degrees;Upright 30-60 min after meal     CHL IP OTHER RECOMMENDATIONS 01/24/2014  Recommended Consults (None)  Oral Care Recommendations Oral care BID Other Recommendations  (None)     CHL IP FOLLOW UP RECOMMENDATIONS 01/24/2014  Follow up Recommendations None     CHL IP FREQUENCY AND DURATION 01/23/2014  Speech Therapy Frequency (ACUTE ONLY) min 2x/week  Treatment Duration 2 weeks     Pertinent Vitals/Pain none    SLP Swallow Goals No flowsheet data found.  No flowsheet data found.           CHL IP PHARYNGEAL PHASE 01/24/2014  Pharyngeal Phase Impaired  Pharyngeal - Pudding Teaspoon (None)  Penetration/Aspiration details (pudding teaspoon) (None)  Pharyngeal - Pudding Cup (None)  Penetration/Aspiration details (pudding cup) (None)  Pharyngeal - Honey Teaspoon (None)  Penetration/Aspiration details (honey  teaspoon) (None)  Pharyngeal - Honey Cup (None)  Penetration/Aspiration details (honey cup) (None)  Pharyngeal - Honey Syringe (None)  Penetration/Aspiration details (honey syringe) (None)  Pharyngeal - Nectar Teaspoon (None)  Penetration/Aspiration details (nectar teaspoon) (None)  Pharyngeal - Nectar Cup (None)  Penetration/Aspiration details (nectar cup) (None)  Pharyngeal - Nectar Straw (None)  Penetration/Aspiration details (nectar straw) (None)  Pharyngeal - Nectar Syringe (None)  Penetration/Aspiration details (nectar syringe) (None)  Pharyngeal - Ice Chips (None)  Penetration/Aspiration details (ice chips) (None)  Pharyngeal - Thin Teaspoon (None)  Penetration/Aspiration details (thin teaspoon) (None) Pharyngeal  - Thin Cup Penetration/Aspiration during swallow  Penetration/Aspiration details (thin cup) Material enters airway,  remains ABOVE vocal cords then ejected out  Pharyngeal - Thin Straw (None)  Penetration/Aspiration details (thin straw) (None)  Pharyngeal - Thin Syringe (None)  Penetration/Aspiration details (thin syringe') (None)  Pharyngeal - Puree (None)  Penetration/Aspiration details (puree) (None)  Pharyngeal - Mechanical Soft (None)  Penetration/Aspiration details (mechanical soft) (None)  Pharyngeal - Regular Pharyngeal residue - valleculae;Reduced  tongue base retraction  Penetration/Aspiration details (regular) (None)  Pharyngeal - Multi-consistency (None)  Penetration/Aspiration details (multi-consistency) (None)  Pharyngeal - Pill Penetration/Aspiration during swallow  Penetration/Aspiration details (pill) Material enters airway,  remains ABOVE vocal cords and not ejected out  Pharyngeal Comment (None)                Houston Siren 01/24/2014, 3:05 PM   Orbie Pyo Litaker M.Ed CCC-SLP Pager [phone]           Scheduled Meds: . amLODipine  5 mg Oral Daily  . antiseptic oral rinse  7 mL Mouth Rinse BID  . budesonide  0.5 mg Nebulization BID  . citalopram  10 mg Oral Daily   . clonazePAM  0.5 mg Oral BID  . clopidogrel  75 mg Oral Daily  . fentaNYL  50 mcg Transdermal Q72H  . heparin  5,000 Units Subcutaneous 3 times per day  . ipratropium-albuterol  3 mL Nebulization TID  . piperacillin-tazobactam (ZOSYN)  IV  3.375 g Intravenous Q8H  . simvastatin  40 mg Oral q1800  . sodium chloride  3 mL Intravenous Q12H  . vancomycin  750 mg Intravenous Q12H   Continuous Infusions:    Principal Problem:   Fall Active Problems:  COPD with emphysema   Elevated troponin   Bradycardia   HLD (hyperlipidemia)   HTN (hypertension)   Panic attack   Weakness   Hypokalemia    Time spent: 25 minutes.    Vernell Leep, MD, FACP, FHM. [facility] Pager [phone]  If 7PM-7AM, please contact night-coverage [email or web address] Password [email or web address] 01/24/2014, 3:53 PM    LOS: 2 days

## 2014-01-24 NOTE — Progress Notes (Signed)
EKG performed. Nurse informed MD, Dr. Algis Liming of pt's QTC 469.  Angeline Slim I 01/24/2014 4:45 PM

## 2014-01-25 ENCOUNTER — Inpatient Hospital Stay (HOSPITAL_COMMUNITY): Payer: Medicare Other

## 2014-01-25 DIAGNOSIS — M25561 Pain in right knee: Secondary | ICD-10-CM

## 2014-01-25 DIAGNOSIS — J439 Emphysema, unspecified: Secondary | ICD-10-CM

## 2014-01-25 LAB — GLUCOSE, CAPILLARY
GLUCOSE-CAPILLARY: 85 mg/dL (ref 70–99)
Glucose-Capillary: 83 mg/dL (ref 70–99)
Glucose-Capillary: 88 mg/dL (ref 70–99)

## 2014-01-25 MED ORDER — ALUM & MAG HYDROXIDE-SIMETH 200-200-20 MG/5ML PO SUSP
30.0000 mL | Freq: Four times a day (QID) | ORAL | Status: DC | PRN
  Administered 2014-01-25: 30 mL via ORAL
  Filled 2014-01-25: qty 30

## 2014-01-25 MED ORDER — NAPROXEN 500 MG PO TABS: 500.0000 mg | ORAL_TABLET | Freq: Two times a day (BID) | ORAL | Status: DC | PRN

## 2014-01-25 MED ORDER — NAPROXEN 500 MG PO TABS
500.0000 mg | ORAL_TABLET | Freq: Once | ORAL | Status: AC
  Administered 2014-01-25: 500 mg via ORAL
  Filled 2014-01-25: qty 1

## 2014-01-25 MED ORDER — PANTOPRAZOLE SODIUM 40 MG PO TBEC: 40.0000 mg | DELAYED_RELEASE_TABLET | Freq: Every day | ORAL | Status: AC

## 2014-01-25 MED ORDER — CITALOPRAM HYDROBROMIDE 10 MG PO TABS: 10.0000 mg | ORAL_TABLET | Freq: Every day | ORAL | Status: DC

## 2014-01-25 MED ORDER — LIDOCAINE 5 % EX PTCH
1.0000 | MEDICATED_PATCH | CUTANEOUS | Status: DC
  Administered 2014-01-25: 1 via TRANSDERMAL
  Filled 2014-01-25 (×3): qty 1

## 2014-01-25 MED ORDER — CLONAZEPAM 0.5 MG PO TABS
0.5000 mg | ORAL_TABLET | Freq: Every day | ORAL | Status: DC
Start: 2014-01-25 — End: 2014-06-20

## 2014-01-25 MED ORDER — CEFUROXIME AXETIL 500 MG PO TABS: 500.0000 mg | ORAL_TABLET | Freq: Two times a day (BID) | ORAL | Status: DC

## 2014-01-25 MED ORDER — AMLODIPINE BESYLATE 5 MG PO TABS: 5.0000 mg | ORAL_TABLET | Freq: Every day | ORAL | Status: DC

## 2014-01-25 MED ORDER — FUROSEMIDE 20 MG PO TABS: 20.0000 mg | ORAL_TABLET | Freq: Every day | ORAL | Status: DC

## 2014-01-25 MED ORDER — PANTOPRAZOLE SODIUM 40 MG PO TBEC
40.0000 mg | DELAYED_RELEASE_TABLET | Freq: Every day | ORAL | Status: DC
  Administered 2014-01-25: 40 mg via ORAL

## 2014-01-25 MED ORDER — FUROSEMIDE 40 MG PO TABS
40.0000 mg | ORAL_TABLET | Freq: Every day | ORAL | Status: DC
  Filled 2014-01-25: qty 1

## 2014-01-25 NOTE — Discharge Instructions (Signed)
Bradycardia °Bradycardia is a term for a heart rate (pulse) that, in adults, is slower than 60 beats per minute. A normal rate is 60 to 100 beats per minute. A heart rate below 60 beats per minute may be normal for some adults with healthy hearts. If the rate is too slow, the heart may have trouble pumping the volume of blood the body needs. If the heart rate gets too low, blood flow to the brain may be decreased and may make you feel lightheaded, dizzy, or faint. °The heart has a natural pacemaker in the top of the heart called the SA node (sinoatrial or sinus node). This pacemaker sends out regular electrical signals to the muscle of the heart, telling the heart muscle when to beat (contract). The electrical signal travels from the upper parts of the heart (atria) through the AV node (atrioventricular node), to the lower chambers of the heart (ventricles). The ventricles squeeze, pumping the blood from your heart to your lungs and to the rest of your body. °CAUSES  °· Problem with the heart's electrical system. °· Problem with the heart's natural pacemaker. °· Heart disease, damage, or infection. °· Medications. °· Problems with minerals and salts (electrolytes). °SYMPTOMS  °· Fainting (syncope). °· Fatigue and weakness. °· Shortness of breath (dyspnea). °· Chest pain (angina). °· Drowsiness. °· Confusion. °DIAGNOSIS  °· An electrocardiogram (ECG) can help your caregiver determine the type of slow heart rate you have. °· If the cause is not seen on an ECG, you may need to wear a heart monitor that records your heart rhythm for several hours or days. °· Blood tests. °TREATMENT  °· Electrolyte supplements. °· Medications. °· Withholding medication which is causing a slow heart rate. °· Pacemaker placement. °SEEK IMMEDIATE MEDICAL CARE IF:  °· You feel lightheaded or faint. °· You develop an irregular heart rate. °· You feel chest pain or have trouble breathing. °MAKE SURE YOU:  °· Understand these  instructions. °· Will watch your condition. °· Will get help right away if you are not doing well or get worse. °Document Released: 09/18/2001 Document Revised: 03/21/2011 Document Reviewed: 04/03/2013 °[employer]® Patient Information ©2015 [employer]. This information is not intended to replace advice given to you by your health care provider. Make sure you discuss any questions you have with your health care provider. ° °

## 2014-01-25 NOTE — Progress Notes (Signed)
Patient started to get SOB and tachy from R knee pain. Patient was crying and placed in char, he cannot even stand. MD lynch was paged and she ordered naproxen. Pharmacist jonathan was called to send medication as soon as possible. Still awaiting medication to be sent. Will continue to monitor patient.

## 2014-01-25 NOTE — Progress Notes (Signed)
Pt and Niece given  Discharge instructions, IV and tele removed. Questions answered. Taken out via wheelchair.

## 2014-01-25 NOTE — Progress Notes (Signed)
Patient was still very tachy and shaking in a great amount of pain, naproxen was sent from pharmacy at 0500 and given. MD lynch aware and lidocaine patch was ordered at 0452, send urgent note to pharmacy still awaiting lidocaine patch from pharmacy. Will continue to monitor patient.

## 2014-01-25 NOTE — Discharge Summary (Signed)
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 effusion. No pneumothorax. Worsened right base airspace disease. New left base airspace disease.  IMPRESSION: Worsened right and developing left base airspace disease. This could represent atelectasis or infection.  New or increased small right pleural effusion. Suspected trace left pleural fluid.   Electronically Signed   By: Abigail Miyamoto M.D.   On: 01/22/2014 19:09   Dg Knee Complete 4 Views Right  01/25/2014   CLINICAL DATA:  Right anterior and posterior knee pain for 1 week; pt states he has had intermittent right knee pain for 15 yrs and has been receiving steroid shots but no surgery; no known injury  EXAM: RIGHT KNEE - COMPLETE 4+ VIEW  COMPARISON:  Right knee MR dated 02/08/2008.  FINDINGS: Medial and lateral meniscal calcifications. Moderate sized effusion. Diffuse soft tissue swelling, most pronounced anteriorly. Atheromatous arterial calcifications.  IMPRESSION: 1. Diffuse soft tissue swelling with a moderate-sized effusion. 2. Chondrocalcinosis.   Electronically  Signed   By: Enrique Sack M.D.   On: 01/25/2014 12:13   Dg Swallowing Func-speech Pathology  01/24/2014   Orbie Pyo [place], CCC-SLP     01/24/2014  3:09 PM  Objective Swallowing Evaluation: Modified Barium Swallowing Study   Patient Details  Name: SHAYDEN BOBIER MRN: [id number] Date of Birth: [birth year]  Today's Date: 01/24/2014  Time: SLP Start Time (ACUTE ONLY): 1355-SLP Stop Time (ACUTE  ONLY): 1415 SLP Time Calculation (min) (ACUTE ONLY): 20 min  Past Medical History:  Past Medical History  Diagnosis Date  . High cholesterol   . Hypertension   . Panic attack   . Acute respiratory distress syndrome (ARDS)   . Pneumonia   . COPD (chronic obstructive pulmonary disease)   . Complication of anesthesia   . PONV (postoperative nausea and vomiting)   . History of hiatal hernia   . Headache   . Arthritis    Past Surgical History:  Past Surgical History  Procedure Laterality Date  . Foot neuroma surgery Left 09/29/2101  . Back surgery    . Tracheostomy      feinstein  . Left heart catheterization with coronary angiogram N/A  01/06/2014    Procedure: LEFT HEART CATHETERIZATION WITH CORONARY ANGIOGRAM;   Surgeon: Peter M Martinique, MD;  Location: [facility] CATH LAB;  Service:  Cardiovascular;  Laterality: N/A;  . Tracheostomy closure     HPI:  HPI: 74 y.o. male with past medical history of hypertension,  hyperlipidemia, panic attack, COPD, who presents with fall and  bradycardia. Patient reports that in the past 2 weeks he has very  mild slurred speech, and mild difficult speaking.  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 Laryngeal penetration was worse with straw boluses  but adequately cleared 90% with cued throat clear/cough.   Residuals present. CXR worsened right and developing left base  airspace disease. This could represent atelectasis or infection.  SLP recommends MBS to fully assess pharyngeal phases swallow  function.   No Data Recorded  Assessment / Plan / Recommendation CHL IP CLINICAL IMPRESSIONS 01/24/2014  Dysphagia Diagnosis Mild pharyngeal phase dysphagia Clinical  impression Overall, pt exhibited functional oral and pharyngeal  phases with min/trace laryngeal penetration with barium during  pill. Penetration likely due to decreased oral cohesion and  containment of two varying consistencies simultaneously. Trace  and flash penetration with majority of thin trials with complete  clearance during the swallow. Recommend he continue regular  texture and thin liquids. Educated pt re: recommendation for  consuming pills whole in applesauce versus thin.       CHL IP TREATMENT RECOMMENDATION 01/24/2014  Treatment Plan Recommendations (No Data)     CHL IP DIET RECOMMENDATION 01/24/2014  Diet Recommendations Regular;Thin liquid  Liquid Administration via Cup;Straw  Medication Administration Whole meds with puree  Compensations Slow rate;Small sips/bites  Postural Changes and/or Swallow Maneuvers Seated upright 90  degrees;Upright 30-60 min after meal     CHL IP OTHER RECOMMENDATIONS 01/24/2014  Recommended Consults (None)  Oral Care Recommendations Oral care BID Other Recommendations  (None)     CHL IP FOLLOW UP RECOMMENDATIONS 01/24/2014  Follow up Recommendations None     CHL IP FREQUENCY AND DURATION 01/23/2014  Speech Therapy Frequency (ACUTE ONLY) min 2x/week  Treatment Duration 2 weeks     Pertinent Vitals/Pain none    SLP Swallow Goals No flowsheet data found.  No flowsheet data found.           CHL IP PHARYNGEAL PHASE 01/24/2014  Pharyngeal Phase Impaired  Pharyngeal - Pudding Teaspoon (None)  Penetration/Aspiration details (pudding teaspoon) (None)  Pharyngeal - Pudding Cup (None)  Penetration/Aspiration details (pudding cup)  (None)  Pharyngeal - Honey Teaspoon (None)  Penetration/Aspiration details (honey teaspoon) (None)  Pharyngeal - Honey Cup (None)  Penetration/Aspiration details (honey cup) (None)  Pharyngeal - Honey Syringe (None)  Penetration/Aspiration details (honey syringe) (None)  Pharyngeal - Nectar Teaspoon (None)  Penetration/Aspiration details (nectar teaspoon) (None)  Pharyngeal - Nectar Cup (None)  Penetration/Aspiration details (nectar cup) (None)  Pharyngeal - Nectar Straw (None)  Penetration/Aspiration details (nectar straw) (None)  Pharyngeal - Nectar Syringe (None)  Penetration/Aspiration details (nectar syringe) (None)  Pharyngeal - Ice Chips (None)  Penetration/Aspiration details (ice chips) (None)  Pharyngeal - Thin Teaspoon (None)  Penetration/Aspiration details (thin teaspoon) (None) Pharyngeal  - Thin Cup Penetration/Aspiration during swallow  Penetration/Aspiration details (thin cup) Material enters airway,  remains ABOVE vocal cords then ejected out  Pharyngeal - Thin Straw (None)  Penetration/Aspiration details (thin straw) (None)  Pharyngeal - Thin Syringe (None)  Penetration/Aspiration details (thin syringe') (None)  Pharyngeal - Puree (None)  Penetration/Aspiration details (puree) (None)  Pharyngeal - Mechanical Soft (None)  Penetration/Aspiration details (mechanical soft) (None)  Pharyngeal - Regular Pharyngeal residue - valleculae;Reduced  tongue base retraction  Penetration/Aspiration details (regular) (None)  Pharyngeal - Multi-consistency (None)  Penetration/Aspiration details (multi-consistency) (None)  Pharyngeal - Pill Penetration/Aspiration during swallow  Penetration/Aspiration details (pill) Material enters airway,  remains ABOVE vocal cords and not ejected out  Pharyngeal Comment (None)                Houston Siren 01/24/2014, 3:05 PM   Orbie Pyo Litaker M.Ed CCC-SLP Pager [phone]       Microbiology: No results found for this or any previous visit (from the past 240 hour(s)).    Labs: Basic Metabolic Panel:  Recent Labs Lab 01/22/14 1855 01/23/14 0755 01/23/14 1317 01/24/14 0502  NA 144 145  --  143  K 3.0* 3.4*  --  3.6  CL 106 105  --  107  CO2 28 27  --  30  GLUCOSE 92 90  --  82  BUN 9 10  --  10  CREATININE 0.85 0.81  --  0.94  CALCIUM 8.1* 8.0*  --  7.9*  MG  --   --  1.7  --    Liver Function Tests:  Recent Labs Lab 01/22/14 1855  AST 16  ALT 9  ALKPHOS 113  BILITOT 0.4  PROT 5.3*  ALBUMIN 2.3*   No results for input(s): LIPASE, AMYLASE in the last 168 hours. No results for input(s): AMMONIA in the last 168 hours. CBC:  Recent Labs Lab 01/22/14 1855 01/23/14 0755 01/24/14 0502  WBC 7.8 8.0 8.5  NEUTROABS 5.7  --   --   HGB 10.5* 9.3* 9.6*  HCT 32.9* 28.7* 30.9*  MCV 90.6 90.5 93.1  PLT 269 246 257   Cardiac Enzymes:  Recent Labs Lab 01/22/14 1855 01/23/14 0113 01/23/14 0755 01/23/14 1235  TROPONINI 0.04* 0.04* 0.04* 0.03   BNP: BNP (last 3 results)  Recent Labs  11/17/13 0400 11/24/13 0245  PROBNP 1388.0* 534.8*   CBG:  Recent Labs Lab 01/24/14 1623 01/24/14 2003 01/25/14 0117 01/25/14 0403 01/25/14 0810  GLUCAP 116* 93 85 83 88    Discussed at length with patient's brother on 01/25/14    Signed:  Vernell Leep, MD, FACP, FHM. [facility] Pager [phone]  If 7PM-7AM, please contact night-coverage [email or web address] Password [facility] 01/25/2014, 2:24 PM

## 2014-01-27 LAB — GLUCOSE, CAPILLARY: GLUCOSE-CAPILLARY: 105 mg/dL — AB (ref 70–99)

## 2014-02-07 ENCOUNTER — Encounter: Payer: Self-pay | Admitting: Cardiology

## 2014-02-07 DIAGNOSIS — R9430 Abnormal result of cardiovascular function study, unspecified: Secondary | ICD-10-CM | POA: Insufficient documentation

## 2014-02-07 DIAGNOSIS — I2510 Atherosclerotic heart disease of native coronary artery without angina pectoris: Secondary | ICD-10-CM | POA: Insufficient documentation

## 2014-02-07 DIAGNOSIS — I350 Nonrheumatic aortic (valve) stenosis: Secondary | ICD-10-CM | POA: Insufficient documentation

## 2014-02-07 DIAGNOSIS — I5032 Chronic diastolic (congestive) heart failure: Secondary | ICD-10-CM | POA: Insufficient documentation

## 2014-02-10 ENCOUNTER — Encounter: Payer: Self-pay | Admitting: Cardiology

## 2014-02-10 ENCOUNTER — Ambulatory Visit: Payer: Medicare Other | Admitting: Cardiology

## 2014-02-10 ENCOUNTER — Ambulatory Visit (INDEPENDENT_AMBULATORY_CARE_PROVIDER_SITE_OTHER): Payer: Medicare Other | Admitting: Cardiology

## 2014-02-10 VITALS — BP 150/74 | HR 68 | Ht 67.0 in | Wt 151.0 lb

## 2014-02-10 DIAGNOSIS — R531 Weakness: Secondary | ICD-10-CM

## 2014-02-10 DIAGNOSIS — I350 Nonrheumatic aortic (valve) stenosis: Secondary | ICD-10-CM

## 2014-02-10 DIAGNOSIS — I5032 Chronic diastolic (congestive) heart failure: Secondary | ICD-10-CM

## 2014-02-10 DIAGNOSIS — I2510 Atherosclerotic heart disease of native coronary artery without angina pectoris: Secondary | ICD-10-CM

## 2014-02-10 NOTE — Assessment & Plan Note (Signed)
He has only mild coronary disease as documented in the Cath Lab. He says he cannot take aspirin. When he is feeling better in general it would be appropriate to re-address the issue of aspirin or possibly Plavix alone as he does have mild coronary disease. He will also be appropriate for him to be on a statin if he can tolerate it when he feels better.

## 2014-02-10 NOTE — Assessment & Plan Note (Signed)
The patient has significant lung disease. His recent illnesses have represented severe pulmonary decompensation. I explained to him that on a relative basis his cardiac status is quite stable. He needs careful follow-up attention to his lung disease.

## 2014-02-10 NOTE — Patient Instructions (Signed)
Your physician recommends that you continue on your current medications as directed. Please refer to the Current Medication list given to you today.  Your physician recommends that you schedule a follow-up appointment in: as needed  

## 2014-02-10 NOTE — Assessment & Plan Note (Signed)
The patient does have mild aortic stenosis by echo and catheterization. No further workup is needed at this time. In the future he will need a follow-up echo in one to 2 years.

## 2014-02-10 NOTE — Assessment & Plan Note (Signed)
He actually only had one episode of diastolic CHF. He is stable at this time. No further workup is needed.

## 2014-02-10 NOTE — Progress Notes (Signed)
Cardiology Office Note   Date:  02/10/2014   ID:  Rick Church, DOB 1940/02/15, MRN [id number]  PCP:  Antony Blackbird, MD  Cardiologist:  Dola Argyle, MD   Chief Complaint  Patient presents with  . Appointment    Follow-up hospitalization for respiratory failure and demand cardiac ischemia      History of Present Illness: Rick Church is a 74 y.o. male who presents for post hospital cardiology follow-up from recent hospitalizations. The patient has had multiple hospitalizations over the past months. During one of them he had severe respiratory failure with sepsis. While he was very sick he had slight troponin elevation. Eventually this was called them and ischemia. As part of the evaluation it was appropriate during that hospitalization to proceed with catheterization. He had only mild coronary disease. He had good left ventricular function. It was felt that his troponin elevation was from demand ischemia and further cardiac workup was not needed. At one point during the hospitalization he had mild CHF that was felt to be diastolic CHF related to his volume status.  Later hospitalizations involved other problems that were not cardiac. He is now here for cardiology follow-up.  Today the patient admits that he has stopped all of his medicines other than his blood pressure meds because of feeling poorly. He is tearful about his overall medical status. However he is not having any significant chest pain.  As part of today's evaluation I have reviewed extensive records including multiple hospitalization discharge summaries. I have reviewed echo reports and cardiology consultations.    Past Medical History  Diagnosis Date  . High cholesterol   . Hypertension   . Panic attack   . Acute respiratory distress syndrome (ARDS)   . Pneumonia   . COPD (chronic obstructive pulmonary disease)   . Complication of anesthesia   . PONV (postoperative nausea and vomiting)   . History of hiatal  hernia   . Headache   . Arthritis   . Ejection fraction     Past Surgical History  Procedure Laterality Date  . Foot neuroma surgery Left 09/29/2101  . Back surgery    . Tracheostomy      feinstein  . Left heart catheterization with coronary angiogram N/A 01/06/2014    Procedure: LEFT HEART CATHETERIZATION WITH CORONARY ANGIOGRAM;  Surgeon: Peter M Martinique, MD;  Location: [facility] CATH LAB;  Service: Cardiovascular;  Laterality: N/A;  . Tracheostomy closure      Patient Active Problem List   Diagnosis Date Noted  . Aortic stenosis 02/07/2014  . CAD (coronary artery disease), native coronary artery 02/07/2014  . Chronic diastolic CHF (congestive heart failure) 02/07/2014  . Ejection fraction   . Bradycardia 01/22/2014  . HLD (hyperlipidemia) 01/22/2014  . HTN (hypertension) 01/22/2014  . Fall 01/22/2014  . Acute encephalopathy 01/22/2014  . Weakness 01/22/2014  . Hypokalemia 01/22/2014  . Panic attack   . SOB (shortness of breath)   . Protein-calorie malnutrition, moderate 12/05/2013  . Hypernatremia 12/05/2013  . 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 ARDS (adult respiratory distress syndrome) 11/17/2013  . Legionella pneumonia 11/17/2013  . 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 Elevated troponin 11/15/2013  . Hypoxia 11/14/2013  . Severe sepsis 11/14/2013  . Pneumonia 11/14/2013      Current Outpatient Prescriptions  Medication Sig Dispense Refill  . amLODipine (NORVASC) 5 MG tablet Take 1 tablet (5 mg total) by mouth daily. 30 tablet 0  . budesonide (PULMICORT) 0.5 MG/2ML nebulizer solution Take 2 mLs (  0.5 mg total) by nebulization 2 (two) times daily. (Patient not taking: Reported on 02/10/2014)  12  . butalbital-acetaminophen-caffeine (FIORICET, ESGIC) 50-325-40 MG per tablet Take 1 tablet by mouth daily as needed. migraines  1  . cefUROXime (CEFTIN) 500 MG tablet Take 1 tablet (500 mg total) by mouth 2 (two) times daily with a meal. (Patient not taking: Reported on 02/10/2014) 8  tablet 0  . citalopram (CELEXA) 10 MG tablet Take 1 tablet (10 mg total) by mouth at bedtime. (Patient not taking: Reported on 02/10/2014)    . clonazePAM (KLONOPIN) 0.5 MG tablet Take 1 tablet (0.5 mg total) by mouth at bedtime. (Patient not taking: Reported on 02/10/2014)    . clopidogrel (PLAVIX) 75 MG tablet Take 1 tablet (75 mg total) by mouth daily. (Patient not taking: Reported on 02/10/2014) 30 tablet 0  . fentaNYL (DURAGESIC - DOSED MCG/HR) 50 MCG/HR Place 1 patch (50 mcg total) onto the skin every 3 (three) days. (Patient not taking: Reported on 02/10/2014) 5 patch 0  . furosemide (LASIX) 20 MG tablet Take 1 tablet (20 mg total) by mouth daily. (Patient not taking: Reported on 02/10/2014) 15 tablet 0  . ipratropium-albuterol (DUONEB) 0.5-2.5 (3) MG/3ML SOLN Take 3 mLs by nebulization 4 (four) times daily. (Patient not taking: Reported on 02/10/2014) 360 mL   . naproxen (NAPROSYN) 500 MG tablet Take 1 tablet (500 mg total) by mouth 2 (two) times daily as needed (knee pain). (Patient not taking: Reported on 02/10/2014) 10 tablet 0  . OXYGEN Inhale 5 L into the lungs continuous.    . pantoprazole (PROTONIX) 40 MG tablet Take 1 tablet (40 mg total) by mouth daily. (Patient not taking: Reported on 02/10/2014) 30 tablet 0  . simvastatin (ZOCOR) 40 MG tablet Take 40 mg by mouth daily at 6 PM.     No current facility-administered medications for this visit.    Allergies:   Atorvastatin; Codeine; Indocin; Penicillins; and Asa    Social History:  The patient  reports that he quit smoking about 3 months ago. His smoking use included Cigarettes. He started smoking about 57 years ago. He smoked 0.00 packs per day. He has never used smokeless tobacco. He reports that he does not drink alcohol or use illicit drugs.   Family History:  The patient's family history includes Brain cancer in his mother; Cervical cancer in his sister; Hypertension in his brother and mother.    ROS:  Please see the history of present  illness. Today the patient denies fever, chills, headache, sweats, rash, change in vision, change in hearing, nausea or vomiting, urinary symptoms. All other systems are reviewed and are negative.       PHYSICAL EXAM: VS:  BP 150/74 mmHg  Pulse 68  Ht 5\' 7"  (1.702 m)  Wt 151 lb (68.493 kg)  BMI 23.64 kg/m2 , Patient is here with her brother. He's using portable O2. He is oriented to person time and place. Affect is normal. Head is atraumatic. He is intermittently tearful. Lungs reveal scattered rhonchi. There is no respiratory distress while wearing oxygen. Cardiac exam reveals an S1 and S2. Abdomen is soft. There is no peripheral edema. He has ecchymoses related to multiple hospitalizations. There is no significant peripheral edema. There are no musculoskeletal deformities. Neurologic is grossly intact.   EKG:   EKG is not done today.   Recent Labs: 11/24/2013: Pro B Natriuretic peptide (BNP) 534.8* 01/22/2014: ALT 9 01/23/2014: B Natriuretic Peptide 598.0*; Magnesium 1.7; TSH 1.309 01/24/2014: BUN  10; Creatinine 0.94; Hemoglobin 9.6*; Platelets 257; Potassium 3.6; Sodium 143    Lipid Panel    Component Value Date/Time   TRIG 409* 11/20/2013 0400      Wt Readings from Last 3 Encounters:  02/10/14 151 lb (68.493 kg)  01/25/14 164 lb 9.6 oz (74.662 kg)  01/08/14 155 lb 3.3 oz (70.4 kg)      Current medicines are reviewed. I have reviewed his medicines with him. He has stopped all of his other medicines other than blood pressure meds. At this point I believe that this is reasonable for him.     ASSESSMENT AND PLAN:

## 2014-02-10 NOTE — Assessment & Plan Note (Signed)
The patient is weak after his multiple hospitalizations. He is tearful today. I was able to give him good information about the fact that his cardiac status is stable.  As part of today's evaluation I spent greater than 25 minutes with his total care. More than half of this time was with direct discussion with the patient and his brother about all aspects of his cardiology care.

## 2014-02-18 ENCOUNTER — Encounter: Payer: Self-pay | Admitting: Pulmonary Disease

## 2014-02-18 ENCOUNTER — Ambulatory Visit (INDEPENDENT_AMBULATORY_CARE_PROVIDER_SITE_OTHER): Payer: Medicare Other | Admitting: Pulmonary Disease

## 2014-02-18 VITALS — BP 144/82 | HR 66 | Ht 66.0 in | Wt 154.0 lb

## 2014-02-18 DIAGNOSIS — F410 Panic disorder [episodic paroxysmal anxiety] without agoraphobia: Secondary | ICD-10-CM

## 2014-02-18 DIAGNOSIS — J439 Emphysema, unspecified: Secondary | ICD-10-CM

## 2014-02-18 DIAGNOSIS — R0602 Shortness of breath: Secondary | ICD-10-CM

## 2014-02-18 MED ORDER — CITALOPRAM HYDROBROMIDE 10 MG PO TABS: 10.0000 mg | ORAL_TABLET | Freq: Every day | ORAL | Status: DC

## 2014-02-18 NOTE — Patient Instructions (Addendum)
You have COPD - lung capacity is at 35% Trial of Spiriva respimat once daily Refills on celexa # 15 Pulm rehab referral

## 2014-02-18 NOTE — Assessment & Plan Note (Signed)
Trial of Spiriva respimat once daily Pulm rehab referral

## 2014-02-18 NOTE — Assessment & Plan Note (Signed)
Tearful today Refills on celexa # 15  Until he contacts his PCP

## 2014-02-18 NOTE — Progress Notes (Signed)
   Subjective:    Patient ID: Rick Church, male    DOB: 1940/08/09, 74 y.o.   MRN: [id number]  HPI  74 year old smoker presents to establish for pulmonary care for COPD He had prolonged admission 11/5-12/7/14 for legionella PNA which progressed to ARDS and eventually tracheostomy. Discharged to SNF then to home 12/21. Readmitted  12/22 - 12/30 for HCAP troponin elevation >> cath nonobstructive CAD He is accompanied by his brother, his wife passed away 6 months ago and he is tearful during the visit He smoked more than 50 pack years and has now cut down to half pack per day. He has oxygen set up by at home but only uses this as needed. He is able to get around by himself and not using his cane as much. He stopped using all his pulmonary medicines-Pulmicort and DuoNeb's are listed. He is able to walk around the house, does get dyspnea on exertion on walking in the store His immunizations are up-to-date Labs show mild anemia Spirometry showed severe airway obstruction with FEV1 36%-1.07, FVC of 103% and ratio of 27. There was flattening of the expiratory loop.  Past Medical History  Diagnosis Date  . High cholesterol   . Hypertension   . Panic attack   . Acute respiratory distress syndrome (ARDS)   . Pneumonia   . COPD (chronic obstructive pulmonary disease)   . Complication of anesthesia   . PONV (postoperative nausea and vomiting)   . History of hiatal hernia   . Headache   . Arthritis   . Ejection fraction    Past Surgical History  Procedure Laterality Date  . Foot neuroma surgery Left 09/29/2101  . Back surgery    . Tracheostomy      feinstein  . Left heart catheterization with coronary angiogram N/A 01/06/2014    Procedure: LEFT HEART CATHETERIZATION WITH CORONARY ANGIOGRAM;  Surgeon: Peter M Martinique, MD;  Location: [facility] CATH LAB;  Service: Cardiovascular;  Laterality: N/A;  . Tracheostomy closure        Review of Systems neg for any significant sore throat, dysphagia,  itching, sneezing, nasal congestion or excess/ purulent secretions, fever, chills, sweats, unintended wt loss, pleuritic or exertional cp, hempoptysis, orthopnea pnd or change in chronic leg swelling. Also denies presyncope, palpitations, heartburn, abdominal pain, nausea, vomiting, diarrhea or change in bowel or urinary habits, dysuria,hematuria, rash, arthralgias, visual complaints, headache, numbness weakness or ataxia.     Objective:   Physical Exam  Gen. Pleasant, well-nourished, in no distress, normal affect ENT - no lesions, no post nasal drip Neck: No JVD, no thyromegaly, no carotid bruits Lungs: no use of accessory muscles, no dullness to percussion, clear without rales or rhonchi  Cardiovascular: Rhythm regular, heart sounds  normal, no murmurs or gallops, no peripheral edema Abdomen: soft and non-tender, no hepatosplenomegaly, BS normal. Musculoskeletal: No deformities, no cyanosis or clubbing Neuro:  alert, non focal        Assessment & Plan:

## 2014-02-21 ENCOUNTER — Other Ambulatory Visit: Payer: Self-pay | Admitting: Pulmonary Disease

## 2014-04-06 ENCOUNTER — Encounter: Payer: Self-pay | Admitting: Internal Medicine

## 2014-04-06 DIAGNOSIS — R339 Retention of urine, unspecified: Secondary | ICD-10-CM | POA: Insufficient documentation

## 2014-04-11 ENCOUNTER — Other Ambulatory Visit: Payer: Self-pay | Admitting: Pulmonary Disease

## 2014-04-11 ENCOUNTER — Other Ambulatory Visit: Payer: Self-pay | Admitting: Podiatrist

## 2014-04-14 NOTE — Telephone Encounter (Signed)
Patient needs to be seen before any further refills 

## 2014-06-23 ENCOUNTER — Ambulatory Visit: Payer: Medicare Other | Admitting: Neurology

## 2014-06-26 ENCOUNTER — Encounter: Payer: Self-pay | Admitting: Neurology

## 2014-07-07 ENCOUNTER — Other Ambulatory Visit: Payer: Self-pay

## 2014-07-09 ENCOUNTER — Ambulatory Visit (INDEPENDENT_AMBULATORY_CARE_PROVIDER_SITE_OTHER): Payer: Medicare Other | Admitting: Neurology

## 2014-07-09 ENCOUNTER — Encounter: Payer: Self-pay | Admitting: Neurology

## 2014-07-09 VITALS — BP 173/77 | HR 60 | Ht 66.0 in | Wt 162.0 lb

## 2014-07-09 DIAGNOSIS — G43009 Migraine without aura, not intractable, without status migrainosus: Secondary | ICD-10-CM

## 2014-07-09 MED ORDER — DICLOFENAC POTASSIUM(MIGRAINE) 50 MG PO PACK: PACK | ORAL | Status: DC

## 2014-07-09 MED ORDER — NORTRIPTYLINE HCL 10 MG PO CAPS: ORAL_CAPSULE | ORAL | Status: DC

## 2014-07-09 NOTE — Progress Notes (Signed)
PATIENT: Rick Church DOB: [birth year]  Chief Complaint  Patient presents with  . Migraine    Says he has a history of migraines but recently has noticed the severity of pain worsening.  His headaches have typically responded well to Fioricet but it has not been helpful recently.  Reports his pain being so severe with one episode, he tried taking 6 tablets of Fiorcet at one time.  He sometimes experiences blurred vision and nausea.  Rick Church  Rick  Rick Church is a 74 years old right-handed male, seen referred by her primary care physician Dr. Antony Church for severe headaches  I have reviewed most recent office visit from Dr. 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  I have personally reviewed CAT scan of the brain No focal acute intracranial abnormality identified. Chronic diffuse atrophy. Chronic bilateral periventricular white matter small vessel ischemic change.  CT of cervical spine: No acute fracture or dislocation in the cervical spine. Postsurgical changes of cervical spine.  He reported a history of migraine since 1961, his typical migraine are severe pounding headache with associated light noise sensitivity, Fioricet as needed usually helpful,   Since his hospital admission in November 2015, he has prolonged more frequent headaches, in  June 2016, he had 3 severe episodes, right retro-orbital area, headache is so severe, he takes up to 6 tablets of Fioricet without helping,   Previously has tried NSAIDs, tramadol, without helping his headaches,   REVIEW OF SYSTEMS: Full 14 system review of systems performed and notable only for fatigue, as above   ALLERGIES: Allergies  Allergen Reactions  . Atorvastatin Other (See Comments)    leg myalgias  . Codeine Nausea And Vomiting  . Indocin [Indomethacin] Nausea Only and Other (See Comments)    dizzy  . Penicillins Hives    Tolerated a dose of cefepime 12/31/13  . Buspirone     intolerance  . Hytrin [Terazosin]     Intolerance   . Neurontin [Gabapentin]     Intolerance   . Oxycodone Nausea Only  . Restoril [Temazepam]     intolerant  . Toprol Xl [Metoprolol Tartrate]     Intolerant   . Zestril [Lisinopril]     intolerant  . Asa [Aspirin] Itching and Rash    HOME MEDICATIONS: Current Outpatient Prescriptions  Medication Sig Dispense Refill  . amLODipine (NORVASC) 5 MG tablet TK 1 T PO QD TO LOWER BLOOD PRESSURE  4  . budesonide (PULMICORT) 0.5 MG/2ML nebulizer solution Take 2 mLs (0.5 mg total) by nebulization 2 (two) times daily.  12  . butalbital-acetaminophen-caffeine (FIORICET, ESGIC) 50-325-40 MG per tablet Take 1 tablet by mouth daily as needed. migraines  1  . citalopram (CELEXA) 10 MG tablet Take 1 tablet (10 mg total) by mouth at bedtime. 15 tablet 0  . clonazePAM (KLONOPIN) 2 MG tablet Take 2 mg by mouth 2 (two) times daily as needed for anxiety.    Marland Kitchen  ibuprofen (ADVIL,MOTRIN) 200 MG tablet Take 200 mg by mouth every 6 (six) hours as needed.    Marland Kitchen LYRICA 75 MG capsule TAKE ONE CAPSULE BY MOUTH AT BEDTIME 30 capsule 0  . meclizine (ANTIVERT) 25 MG tablet Take 25 mg by mouth 3 (three) times daily as needed for dizziness.    . Multiple Vitamin (MULTIVITAMIN) capsule Take 1 capsule by mouth daily.    . pantoprazole (PROTONIX) 40 MG tablet Take 1 tablet (40 mg  total) by mouth daily. 30 tablet 0  . Potassium Gluconate 2 MEQ TABS Take by mouth.    . simvastatin (ZOCOR) 40 MG tablet Take 40 mg by mouth daily at 6 PM.     No current facility-administered medications for this visit.    PAST MEDICAL HISTORY: Past Medical History  Diagnosis Date  . High cholesterol   . Hypertension   . Panic attack   . Acute respiratory distress syndrome (ARDS)   . Pneumonia   . COPD (chronic obstructive pulmonary disease)   . Complication of anesthesia   . PONV (postoperative nausea and vomiting)   . History of hiatal hernia   . Headache   . Arthritis   . Ejection fraction   . Spondylitis   . GERD (gastroesophageal reflux disease)   . Anxiety   . Neck pain   . Migraine   . Legionnaire's disease     PAST SURGICAL HISTORY: Past Surgical History  Procedure Laterality Date  . Foot neuroma surgery Left 09/29/2101  . Back surgery    . Tracheostomy      feinstein  . Left heart catheterization with coronary angiogram Rick Church/A 01/06/2014    Procedure: LEFT HEART CATHETERIZATION WITH CORONARY ANGIOGRAM;  Surgeon: Rick M Martinique, MD;  Location: [facility] CATH LAB;  Service: Cardiovascular;  Laterality: Rick Church/A;  . Tracheostomy closure    . Neck surgery    . Rotator cuff repair Left   . Shoulder surgery Right 1998, 2002  . Carpal tunnel release Bilateral   . Tonsillectomy      FAMILY HISTORY: Family History  Problem Relation Age of Onset  . Hypertension Mother   . Brain cancer Mother   . Lung cancer Mother   . Hypertension Brother   . Cervical cancer Sister   . Hypertension Father   . Cerebral aneurysm Father     SOCIAL HISTORY:  History   Social History  . Marital Status: Married    Spouse Name: Rick Church/A  . Number of Children: 0  . Years of Education: GED   Occupational History  . Disabled    Social History Main Topics  . Smoking status: Current Some Day Smoker -- 1.50 packs/day    Types: Cigarettes    Start date: 11/14/1956    Last Attempt to Quit:  11/08/2013  . Smokeless tobacco: Never Used     Comment: smokes 4 cigarettes daily  . Alcohol Use: No  . Drug Use: No  . Sexual Activity: Not on file   Other Topics Concern  . Not on file   Social History Narrative   Lives at home alone.   Right-handed.   Drinks approximately 1 liter of Dr. Malachi Bonds per day.     PHYSICAL EXAM   Filed Vitals:   07/09/14 1309  BP: 173/77  Pulse: 60  Height: 5\' 6"  (1.676 m)  Weight: 162 lb (73.483 kg)    Not recorded      Body mass index is 26.16 kg/(m^2).  PHYSICAL EXAMNIATION:  Gen: NAD,  conversant, well nourised, obese, well groomed                     Cardiovascular: Regular rate rhythm, no peripheral edema, warm, nontender. Eyes: Conjunctivae clear without exudates or hemorrhage Neck: Supple, no carotid bruise. Pulmonary: Clear to auscultation bilaterally   NEUROLOGICAL EXAM:  MENTAL STATUS: Speech:    Speech is normal; fluent and spontaneous with normal comprehension.  Cognition:    The patient is oriented to person, place, and time;     recent and remote memory intact;     language fluent;     normal attention, concentration,     fund of knowledge.  CRANIAL NERVES: CN II: Visual fields are full to confrontation. Fundoscopic exam is normal with sharp discs, pupils were equal round reactive to light.  CN III, IV, VI: extraocular movement are normal. No ptosis. CN V: Facial sensation is intact to pinprick in all 3 divisions bilaterally. Corneal responses are intact.  CN VII: Face is symmetric with normal eye closure and smile. CN VIII: Hearing is normal to rubbing fingers CN IX, X: Palate elevates symmetrically. Phonation is normal. CN XI: Head turning and shoulder shrug are intact CN XII: Tongue is midline with normal movements and no atrophy.  MOTOR: There is no pronator drift of out-stretched arms. Muscle bulk and tone are normal. Muscle strength is normal.  REFLEXES: Reflexes are 2+ and symmetric at the biceps,  triceps, knees, and ankles. Plantar responses are flexor.  SENSORY: Light touch, pinprick, position sense, and vibration sense are intact in fingers and toes.  COORDINATION: Rapid alternating movements and fine finger movements are intact. There is no dysmetria on finger-to-nose and heel-knee-shin. There are no abnormal or extraneous movements.   GAIT/STANCE: Posture is normal. Gait is steady with normal steps, base, arm swing, and turning. Heel and toe walking are normal. Tandem gait is normal.  Romberg is absent.   DIAGNOSTIC DATA (LABS, IMAGING, TESTING) - I reviewed patient records, labs, notes, testing and imaging myself where available.  Lab Results  Component Value Date   WBC 8.5 01/24/2014   HGB 9.6* 01/24/2014   HCT 30.9* 01/24/2014   MCV 93.1 01/24/2014   PLT 257 01/24/2014      Component Value Date/Time   NA 143 01/24/2014 0502   K 3.6 01/24/2014 0502   CL 107 01/24/2014 0502   CO2 30 01/24/2014 0502   GLUCOSE 82 01/24/2014 0502   BUN 10 01/24/2014 0502   CREATININE 0.94 01/24/2014 0502   CALCIUM 7.9* 01/24/2014 0502   PROT 5.3* 01/22/2014 1855   ALBUMIN 2.3* 01/22/2014 1855   AST 16 01/22/2014 1855   ALT 9 01/22/2014 1855   ALKPHOS 113 01/22/2014 1855   BILITOT 0.4 01/22/2014 1855   GFRNONAA 82* 01/24/2014 0502   GFRAA >90 01/24/2014 0502   Lab Results  Component Value Date   TRIG 409* 11/20/2013   No results found for: HGBA1C No results found for: VITAMINB12 Lab Results  Component Value Date   TSH 1.309 01/23/2014      ASSESSMENT AND PLAN  Rick Church is a 74 y.o. male   Migraine headaches:   Nortriptyline 10 mg, titrating to 20 mg every day for preventive medications  Cambia as needed for abortive treatment    Marcial Pacas, M.D. Ph.D.  [facility] [address], [place], [place] [address] Ph: [phone] Fax: [phone]

## 2014-07-10 ENCOUNTER — Telehealth: Payer: Self-pay | Admitting: Neurology

## 2014-07-10 NOTE — Telephone Encounter (Signed)
Patient is calling because yesterday he was given samples of Diclofenac Potassium (CAMBIA) 50 MG PACK. A Rx was also called to [facility] on [address] but the pharmacist said insurance will not pay for the powder form but will pay for the pill. Please call [facility]. Thank you.

## 2014-07-15 NOTE — Telephone Encounter (Signed)
We were able to get ins to approve the request for coverage on Cambia effective until 01/10/2015 Ref # [id number].  Both the patient and pharmacy have been notified.

## 2014-07-25 ENCOUNTER — Telehealth: Payer: Self-pay | Admitting: Cardiology

## 2014-07-25 NOTE — Telephone Encounter (Signed)
Received a release form and [employer] authorization to obtain and disclose information sent to [place] (without payment).  07/25/14  ltd

## 2014-08-22 ENCOUNTER — Telehealth: Payer: Self-pay | Admitting: Internal Medicine

## 2014-08-22 NOTE — Telephone Encounter (Signed)
Walk in pt form-Medical History form-dropped off gave to [facility]

## 2014-08-25 NOTE — Telephone Encounter (Signed)
Form given to [place], RN (Dr. Priscille Kluver nurse) to address with Dr. Ron Parker.   Dr. Caryl Comes cannot fill this form out as he has never seen the patient.

## 2014-09-05 ENCOUNTER — Telehealth: Payer: Self-pay | Admitting: Cardiology

## 2014-09-05 NOTE — Telephone Encounter (Signed)
Patient dropped off [employer] papers to be completed, Per Dr.Katz he will not complete he printed out records I called and left patient message  To call back so he can pick packet up.

## 2014-09-16 ENCOUNTER — Ambulatory Visit: Payer: Self-pay | Admitting: Neurology

## 2014-09-30 ENCOUNTER — Telehealth: Payer: Self-pay | Admitting: Cardiology

## 2014-09-30 NOTE — Telephone Encounter (Signed)
PATIENT HAS NOT COME TO PICK UP [place] PAPERS, THESE HAVE BEEN PLACED IN PURPLE FOLDER FOR FUTURE PICK UP  09/30/14/KM

## 2014-11-18 ENCOUNTER — Telehealth: Payer: Self-pay | Admitting: *Deleted

## 2014-11-18 ENCOUNTER — Institutional Professional Consult (permissible substitution): Payer: Medicare Other | Admitting: Neurology

## 2014-11-18 NOTE — Telephone Encounter (Signed)
No showed follow up appointment. 

## 2014-11-19 ENCOUNTER — Encounter: Payer: Self-pay | Admitting: Neurology

## 2014-11-25 ENCOUNTER — Encounter: Payer: Self-pay | Admitting: Neurology

## 2014-11-25 ENCOUNTER — Ambulatory Visit (INDEPENDENT_AMBULATORY_CARE_PROVIDER_SITE_OTHER): Payer: Medicare Other | Admitting: Neurology

## 2014-11-25 VITALS — BP 164/71 | HR 58 | Ht 66.0 in | Wt 168.0 lb

## 2014-11-25 DIAGNOSIS — M4806 Spinal stenosis, lumbar region: Secondary | ICD-10-CM

## 2014-11-25 DIAGNOSIS — E538 Deficiency of other specified B group vitamins: Secondary | ICD-10-CM | POA: Diagnosis not present

## 2014-11-25 DIAGNOSIS — M48061 Spinal stenosis, lumbar region without neurogenic claudication: Secondary | ICD-10-CM | POA: Insufficient documentation

## 2014-11-25 DIAGNOSIS — R202 Paresthesia of skin: Secondary | ICD-10-CM | POA: Diagnosis not present

## 2014-11-25 MED ORDER — PREGABALIN 75 MG PO CAPS: 75.0000 mg | ORAL_CAPSULE | Freq: Every day | ORAL | Status: DC

## 2014-11-25 NOTE — Progress Notes (Signed)
Chief Complaint  Patient presents with  . Peripheral Neuropathy    Reports painful, burning and tingling in his bilateral feet.  He is currently taking Lyrica 75mg , between 2-5 times daily.  . Migraine    Feels migraines are stable.  He only gets one about every two months that respond well to [facility].      PATIENT: Rick Church DOB: [birth year]  Chief Complaint  Patient presents with  . Peripheral Neuropathy    Reports painful, burning and tingling in his bilateral feet.  He is currently taking Lyrica 75mg , between 2-5 times daily.  . Migraine    Feels migraines are stable.  He only gets one about every two months that respond well to [facility].    HISTORICAL  Rick Church is a 74 years old right-handed male, seen in refer by her primary care physician Dr. Antony Blackbird for severe headaches in June 2016.  I have reviewed most recent office visit from Dr. 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  I have personally reviewed CAT scan of the brain: No focal acute intracranial abnormality identified. Chronic diffuse atrophy. Chronic bilateral periventricular white matter small vessel ischemic change.  CT of cervical spine: No acute fracture or dislocation in the cervical spine. Postsurgical changes of cervical spine.  He reported a history of migraine since 1961, his  typical migraine are severe pounding headache with associated light noise sensitivity, Fioricet as needed usually helpful,   Since his hospital admission in November 2015, he has prolonged more frequent headaches, in June 2016, he had 3 severe episodes, right retro-orbital area, headache is so severe, he takes up to 6 tablets of Fioricet without helping,   Previously has tried NSAIDs, tramadol, without helping his headaches,  UPDATE Nov 25 2014: He has taken nortriptyline 20mg  qhs, migraine has much improved, he is taking [place] as needed, which has been helpful  He also complains subacute onset bilateral plantar feet paresthesia since September 2016, numbness tingling, burning sensation, gradually getting worse, going up to his ankle level now, he denies bilateral hands upper extremity paresthesia, he has midline low back pain, no shooting pain, he had a history of cervical, lumbar decompression surgery in the past, he denies bowel and bladder incontinence, gait difficulty due to low back pain, lower extremity paresthesia.  He is now taking Lyrica 75 mg which has been helpful, I have reviewed laboratory evaluation from [place], A1c was 5.8, vitamin B12 was 267    REVIEW OF SYSTEMS: Full 14 system review of systems performed and notable only for fatigue, ringing ears, cough, insomnia, back pain, walking difficulty, bruise easily, headaches  ALLERGIES: Allergies  Allergen Reactions  . Atorvastatin Other (See Comments)    leg myalgias  . Codeine Nausea And Vomiting  . Indocin [Indomethacin] Nausea Only and Other (See Comments)    dizzy  . Penicillins Hives    Tolerated a dose of cefepime 12/31/13  . Buspirone     intolerance  .  Hytrin [Terazosin]     Intolerance   . Neurontin [Gabapentin]     Intolerance   . Oxycodone Nausea Only  . Restoril [Temazepam]     intolerant  . Toprol Xl [Metoprolol Tartrate]     Intolerant   . Zestril [Lisinopril]     intolerant  . Asa  [Aspirin] Itching and Rash    HOME MEDICATIONS: Current Outpatient Prescriptions  Medication Sig Dispense Refill  . amLODipine (NORVASC) 5 MG tablet TK 1 T PO QD TO LOWER BLOOD PRESSURE  4  . budesonide (PULMICORT) 0.5 MG/2ML nebulizer solution Take 2 mLs (0.5 mg total) by nebulization 2 (two) times daily.  12  . butalbital-acetaminophen-caffeine (FIORICET, ESGIC) 50-325-40 MG per tablet Take 1 tablet by mouth daily as needed. migraines  1  . citalopram (CELEXA) 10 MG tablet Take 1 tablet (10 mg total) by mouth at bedtime. 15 tablet 0  . clonazePAM (KLONOPIN) 2 MG tablet Take 2 mg by mouth 2 (two) times daily as needed for anxiety.    . Diclofenac Potassium (CAMBIA) 50 MG PACK As needed for severe migraine 9 each 5  . ibuprofen (ADVIL,MOTRIN) 200 MG tablet Take 200 mg by mouth every 6 (six) hours as needed.    Marland Kitchen losartan (COZAAR) 50 MG tablet TK 1 T PO QD TO LOWER BLOOD PRESSURE  3  . LYRICA 75 MG capsule TAKE ONE CAPSULE BY MOUTH AT BEDTIME 30 capsule 0  . meclizine (ANTIVERT) 25 MG tablet Take 25 mg by mouth 3 (three) times daily as needed for dizziness.    . Multiple Vitamin (MULTIVITAMIN) capsule Take 1 capsule by mouth daily.    . nortriptyline (PAMELOR) 10 MG capsule One po qhs xone week, then 2 tabs po qhs 60 capsule 6  . pantoprazole (PROTONIX) 40 MG tablet Take 1 tablet (40 mg total) by mouth daily. 30 tablet 0  . Potassium Gluconate 2 MEQ TABS Take by mouth.    . simvastatin (ZOCOR) 40 MG tablet Take 40 mg by mouth daily at 6 PM.     No current facility-administered medications for this visit.    PAST MEDICAL HISTORY: Past Medical History  Diagnosis Date  . High cholesterol   . Hypertension   . Panic attack   . Acute respiratory distress syndrome (ARDS) (HCC)   . Pneumonia   . COPD (chronic obstructive pulmonary disease) ([place])   . Complication of anesthesia   . PONV (postoperative nausea and vomiting)   . History of hiatal hernia   . Headache   . Arthritis   .  Ejection fraction   . Spondylitis ([place])   . GERD (gastroesophageal reflux disease)   . Anxiety   . Neck pain   . Migraine   . Legionnaire's disease ([place])     PAST SURGICAL HISTORY: Past Surgical History  Procedure Laterality Date  . Foot neuroma surgery Left 09/29/2101  . Back surgery    . Tracheostomy      feinstein  . Left heart catheterization with coronary angiogram N/A 01/06/2014    Procedure: LEFT HEART CATHETERIZATION WITH CORONARY ANGIOGRAM;  Surgeon: Peter M Martinique, MD;  Location: [facility] CATH LAB;  Service: Cardiovascular;  Laterality: N/A;  . Tracheostomy closure    . Neck surgery    . Rotator cuff repair Left   . Shoulder surgery Right 1998, 2002  . Carpal tunnel release Bilateral   . Tonsillectomy      FAMILY HISTORY: Family History  Problem Relation Age of Onset  . Hypertension  Mother   . Brain cancer Mother   . Lung cancer Mother   . Hypertension Brother   . Cervical cancer Sister   . Hypertension Father   . Cerebral aneurysm Father     SOCIAL HISTORY:  Social History   Social History  . Marital Status: Married    Spouse Name: N/A  . Number of Children: 0  . Years of Education: GED   Occupational History  . Disabled    Social History Main Topics  . Smoking status: Current Some Day Smoker -- 1.50 packs/day    Types: Cigarettes    Start date: 11/14/1956    Last Attempt to Quit: 11/08/2013  . Smokeless tobacco: Never Used     Comment: smokes 4 cigarettes daily  . Alcohol Use: No  . Drug Use: No  . Sexual Activity: Not on file   Other Topics Concern  . Not on file   Social History Narrative   Lives at home alone.   Right-handed.   Drinks approximately 1 liter of Dr. Malachi Bonds per day.     PHYSICAL EXAM   Filed Vitals:   11/25/14 0804  BP: 164/71  Pulse: 58  Height: 5\' 6"  (1.676 m)  Weight: 168 lb (76.204 kg)    Not recorded      Body mass index is 27.13 kg/(m^2).  PHYSICAL EXAMNIATION:  Gen: NAD, conversant, well nourised,  obese, well groomed                     Cardiovascular: Regular rate rhythm, no peripheral edema, warm, nontender. Eyes: Conjunctivae clear without exudates or hemorrhage Neck: Supple, no carotid bruise. Pulmonary: Clear to auscultation bilaterally   NEUROLOGICAL EXAM:  MENTAL STATUS: Speech:    Speech is normal; fluent and spontaneous with normal comprehension.  Cognition:    The patient is oriented to person, place, and time;     recent and remote memory intact;     language fluent;     normal attention, concentration,     fund of knowledge.  CRANIAL NERVES: CN II: Visual fields are full to confrontation. Fundoscopic exam is normal with sharp discs, pupils were equal round reactive to light.  CN III, IV, VI: extraocular movement are normal. No ptosis. CN V: Facial sensation is intact to pinprick in all 3 divisions bilaterally. Corneal responses are intact.  CN VII: Face is symmetric with normal eye closure and smile. CN VIII: Hearing is normal to rubbing fingers CN IX, X: Palate elevates symmetrically. Phonation is normal. CN XI: Head turning and shoulder shrug are intact CN XII: Tongue is midline with normal movements and no atrophy.  MOTOR: There is no pronator drift of out-stretched arms. Muscle bulk and tone are normal. Muscle strength is normal.  REFLEXES: Reflexes are 1 and symmetric at the biceps, triceps, knees, and absent at ankles. Plantar responses are flexor.  SENSORY: Length dependent decreased to light touch, pinprick and vibratory sensation at ankle level   COORDINATION: Rapid alternating movements and fine finger movements are intact. There is no dysmetria on finger-to-nose and heel-knee-shin. There are no abnormal or extraneous movements.   GAIT/STANCE: Need to push up to get up from seated position, bending forward, mildly antalgic  DIAGNOSTIC DATA (LABS, IMAGING, TESTING) - I reviewed patient records, labs, notes, testing and imaging myself where  available.   ASSESSMENT AND PLAN  RAIFE LORENC is a 74 y.o. male   Migraine headaches:   Nortriptyline 10 mg, titrating to 20 mg  every day for preventive medications  Cambia as needed for abortive treatment  Bilateral lower extremity paresthesia  Differentiation diagnosis including peripheral neuropathy, versus lumbar radiculopathy  Laboratory evaluations  EMG nerve conduction study  Refilled his Lyrica 75 mg 4 tablets each day Gait difficulty:  Multifactorial, low back pain, feet paresthesia,   Marcial Pacas, M.D. Ph.D.  [facility] [address] [place], [place] [address] Ph: [phone] Fax: [phone]

## 2014-11-26 ENCOUNTER — Telehealth: Payer: Self-pay | Admitting: *Deleted

## 2014-11-26 NOTE — Telephone Encounter (Signed)
Attempted call again - left another message requesting a call back.

## 2014-11-26 NOTE — Telephone Encounter (Signed)
Left message for a return call

## 2014-11-26 NOTE — Telephone Encounter (Signed)
-----   Message from Marcial Pacas, MD sent at 11/26/2014 11:07 AM EST ----- Please call patient for mild elevated A1c 6.2, indicating elevated glucose level around 131 over the past few month, rest of the laboratory evaluation is normal. He should address the issue of elevated glucose with his primary care at next follow-up visit

## 2014-11-27 NOTE — Telephone Encounter (Signed)
I have spoken with Rick Church this morning and per Dr. [name], advised labwork done in our office was normal, with exception of mildly elevated hgb a1c.  He verbalized understanding of same, sts. has already addressed this with his pcp/fim

## 2014-11-27 NOTE — Telephone Encounter (Signed)
Patient returned Michelle's call from yesterday.

## 2014-11-28 LAB — TSH: TSH: 0.999 u[IU]/mL (ref 0.450–4.500)

## 2014-11-28 LAB — HEMOGLOBIN A1C
ESTIMATED AVERAGE GLUCOSE: 131 mg/dL
Hgb A1c MFr Bld: 6.2 % — ABNORMAL HIGH (ref 4.8–5.6)

## 2014-11-28 LAB — C-REACTIVE PROTEIN: CRP: 3.2 mg/L (ref 0.0–4.9)

## 2014-11-28 LAB — METHYLMALONIC ACID, SERUM: Methylmalonic Acid: 125 nmol/L (ref 0–378)

## 2014-11-28 LAB — FOLATE: FOLATE: 7.3 ng/mL (ref >3.0)

## 2014-11-28 LAB — ANA: Anti Nuclear Antibody(ANA): NEGATIVE

## 2014-11-28 LAB — VITAMIN B12: VITAMIN B 12: 691 pg/mL (ref 211–946)

## 2014-11-28 LAB — SEDIMENTATION RATE: Sed Rate: 2 mm/hr (ref 0–30)

## 2015-01-26 ENCOUNTER — Encounter: Payer: Self-pay | Admitting: Neurology

## 2015-03-19 ENCOUNTER — Other Ambulatory Visit: Payer: Self-pay | Admitting: Neurology

## 2015-04-03 IMAGING — CR DG CHEST 1V PORT
2 series · 2 of 2 positions shown · non-contrast
Comparison: 12/31/2013

CLINICAL DATA: Weakness. Dizziness and shortness of breath for 1
day. Initial encounter.

EXAM:
PORTABLE CHEST - 1 VIEW

[AP (1 of 2)]
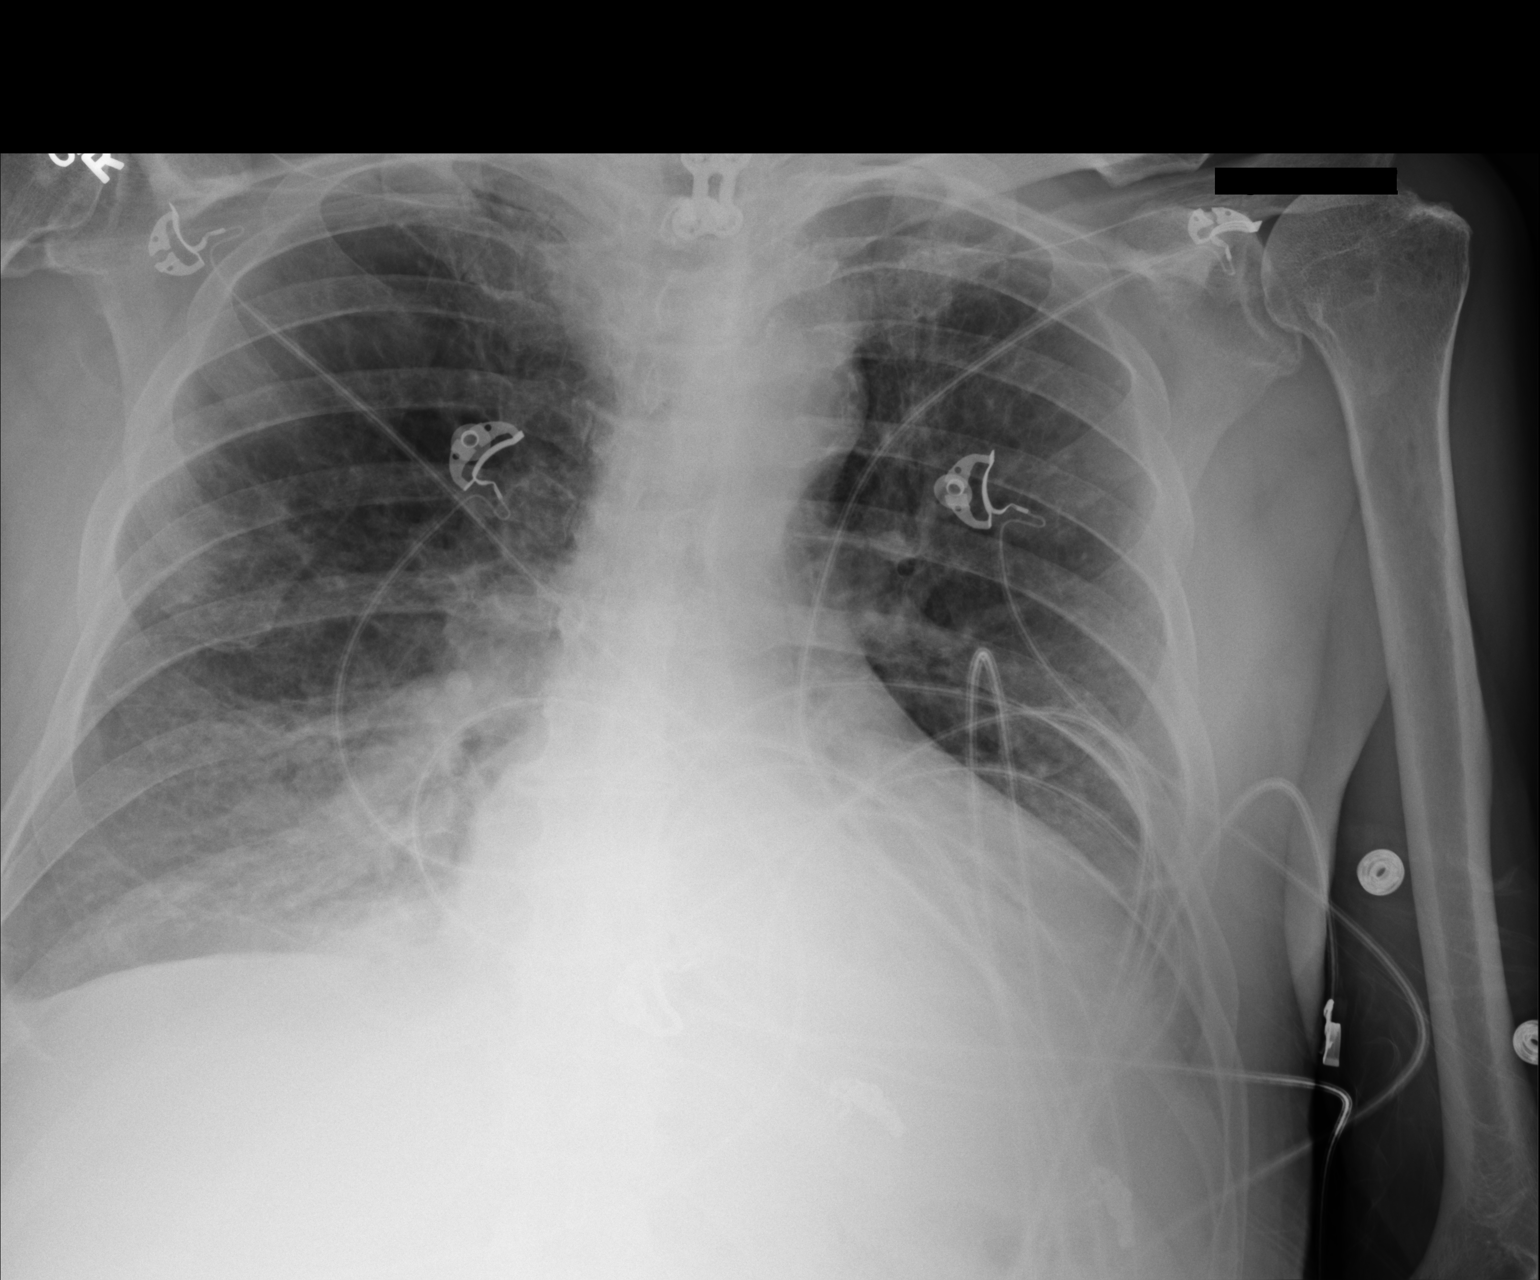

[AP (2 of 2)]
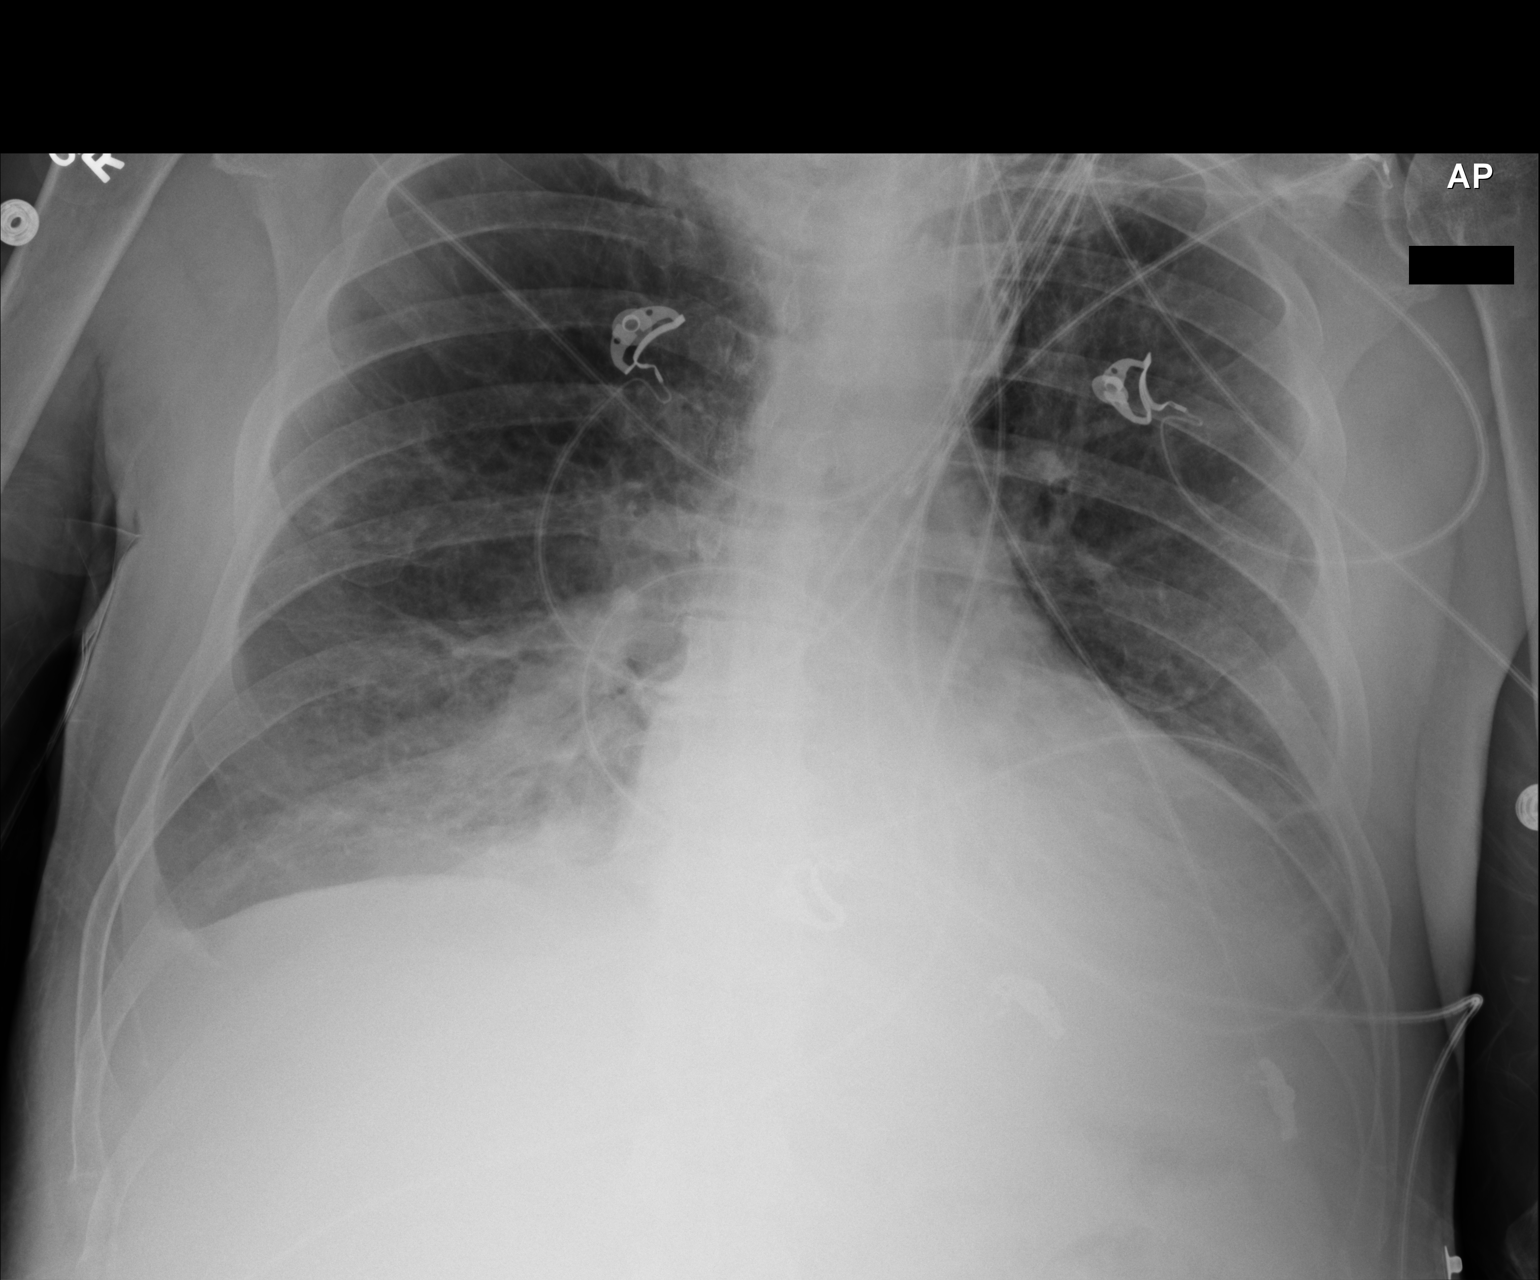

[2 of 2 positions shown; findings below may reference images not displayed]

FINDINGS: Two frontal radiographs. Lower cervical spine fixation. Numerous
leads and wires project over the chest. Cardiomegaly accentuated by
AP portable technique. Small right pleural effusion is new or
increased. There is likely a small layering left pleural effusion.
No pneumothorax. Worsened right base airspace disease. New left base
airspace disease.
IMPRESSION: Worsened right and developing left base airspace disease. This could
represent atelectasis or infection.

New or increased small right pleural effusion. Suspected trace left
pleural fluid.

## 2015-04-05 IMAGING — RF DG SWALLOWING FUNCTION - NRPT [facility]
1 series · 18 of 24 positions shown · non-contrast
Comparison: none

[Series 1: run · 19 acquisitions, 18 frames shown]
[im 1/19]
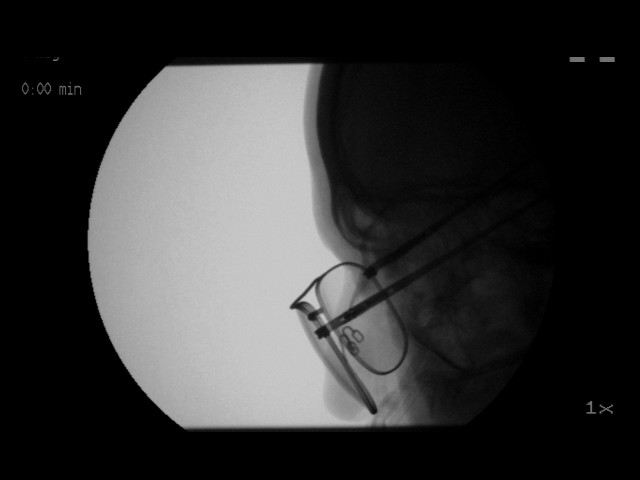
[im 2/19]
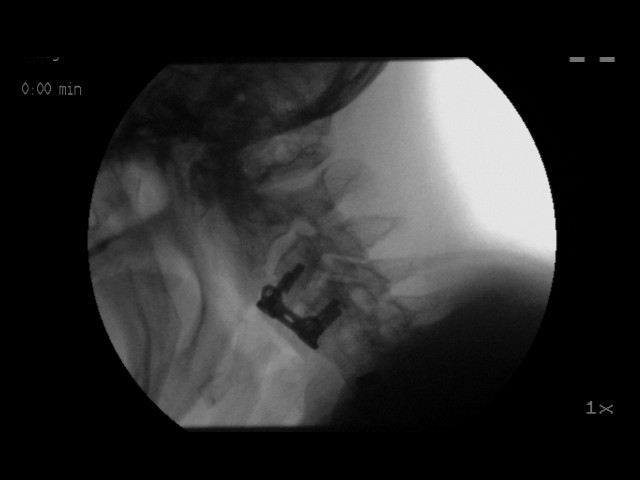
[im 3/19]
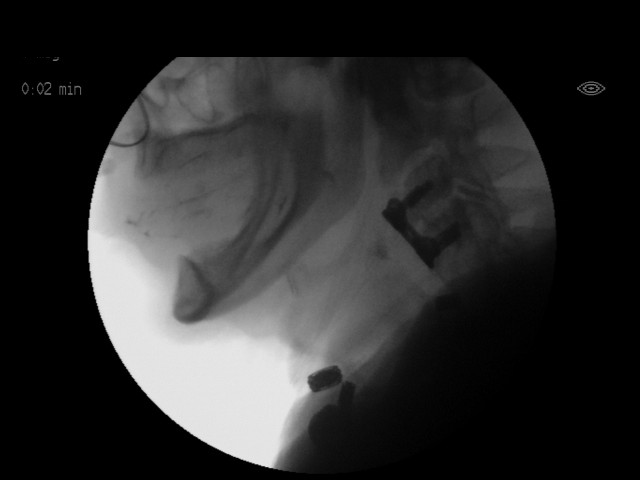
[im 4/19]
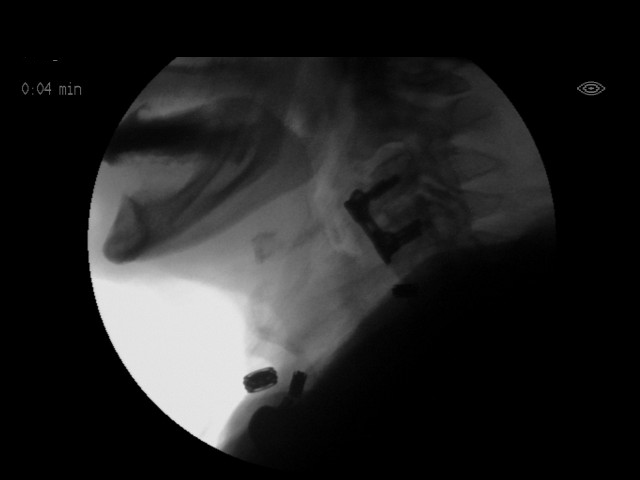
[im 5/19]
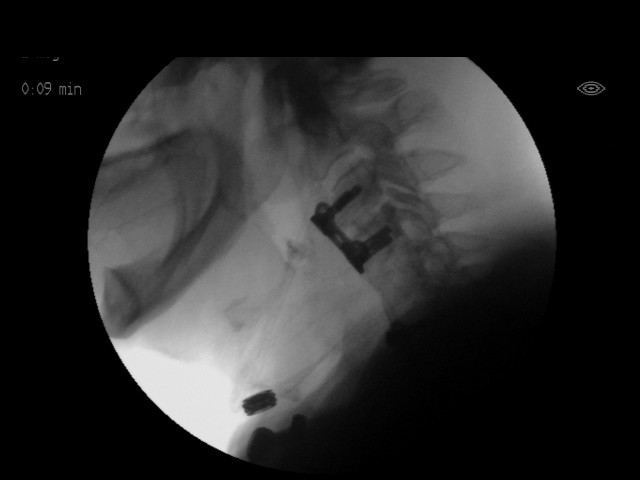
[im 6/19]
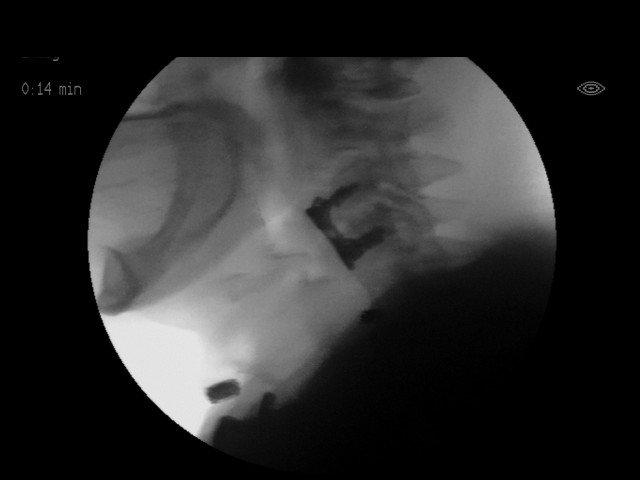
[im 7/19]
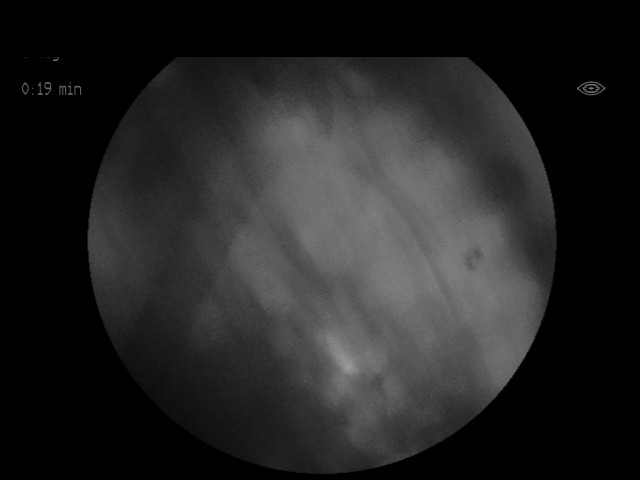
[im 9/19]
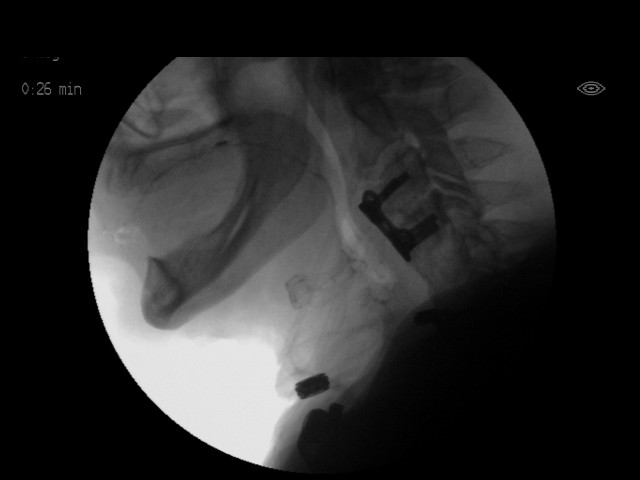
[im 10/19]
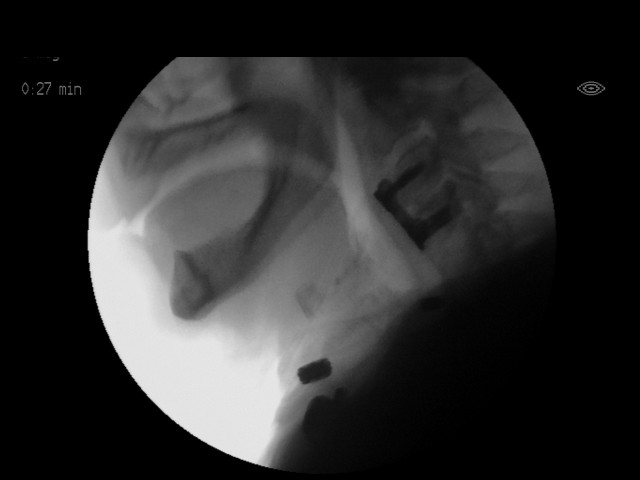
[im 11/19]
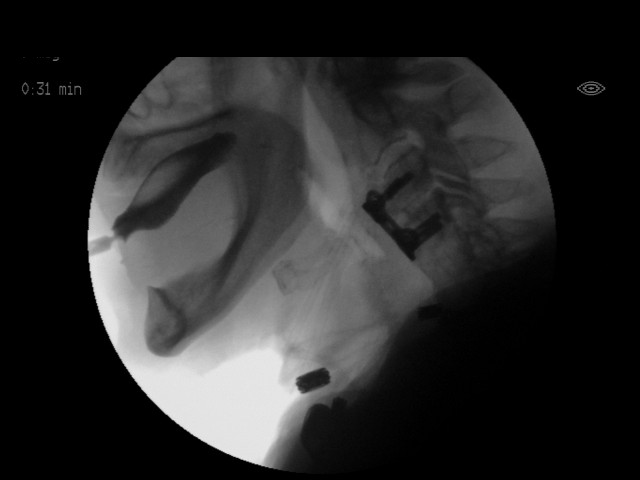
[im 12/19]
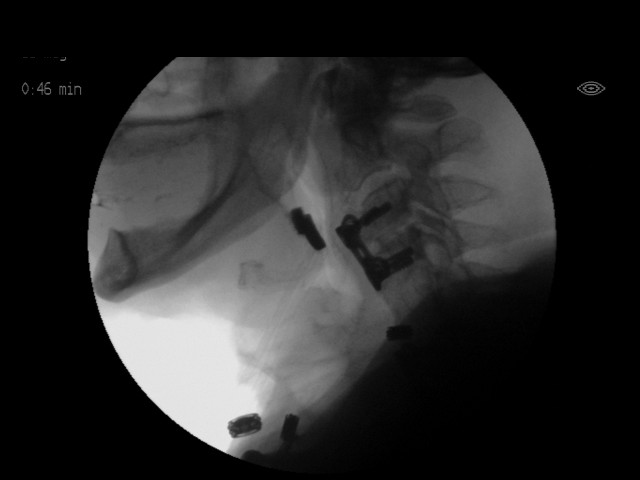
[im 13/19]
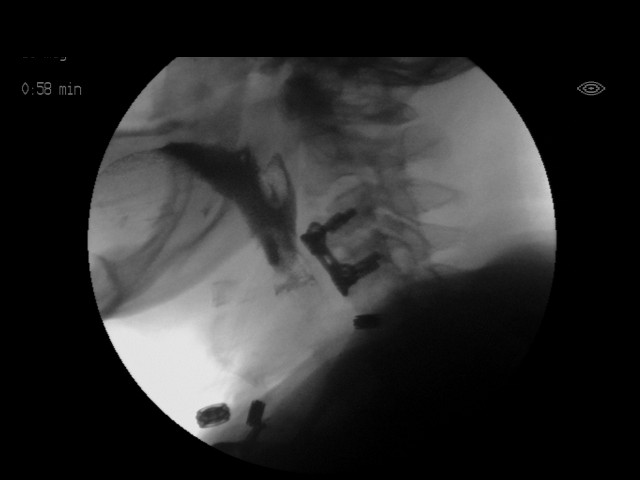
[im 14/19]
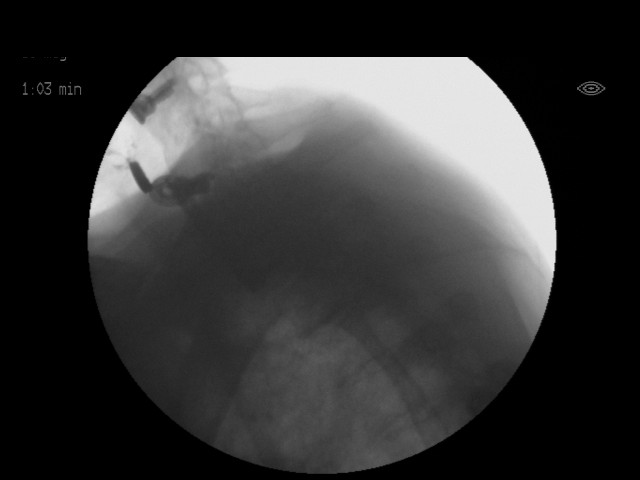
[im 15/19]
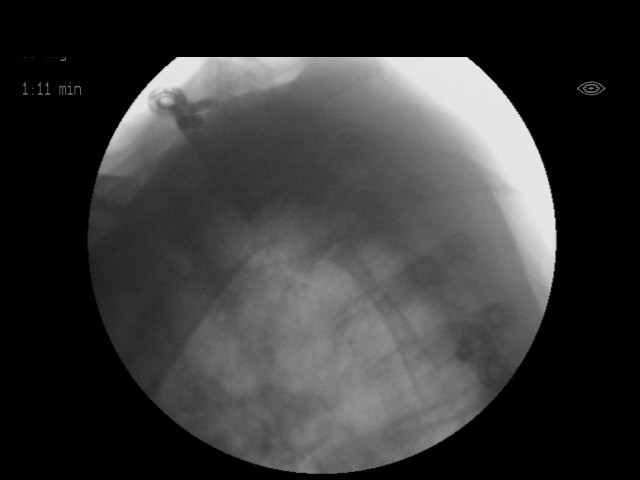
[im 16/19]
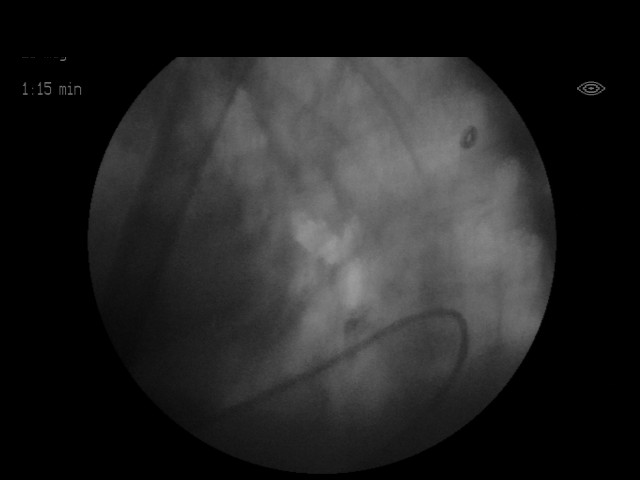
[im 17/19]
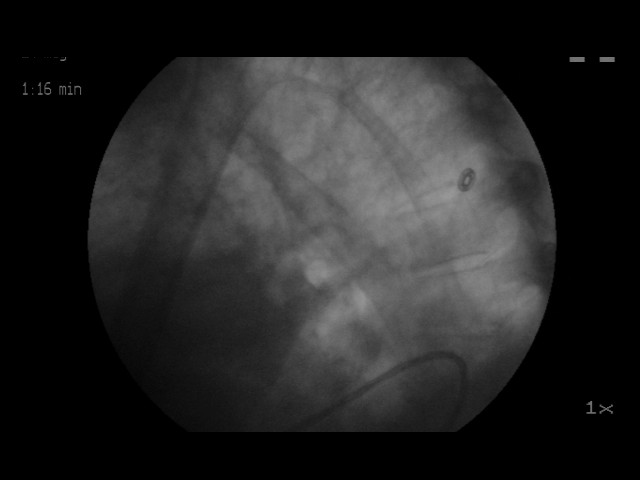
[im 19/19]
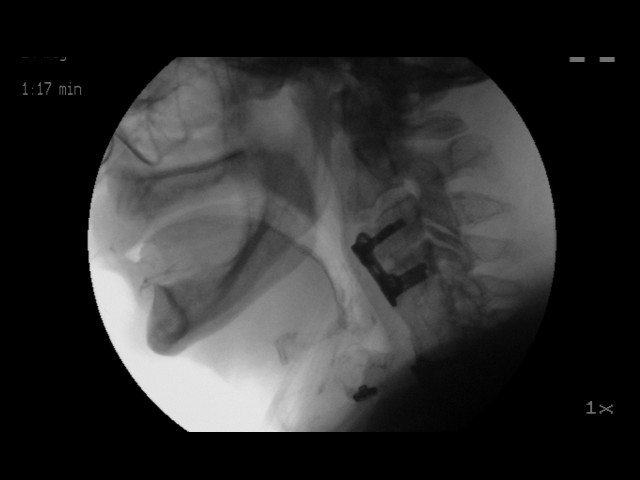
[im 19/19]
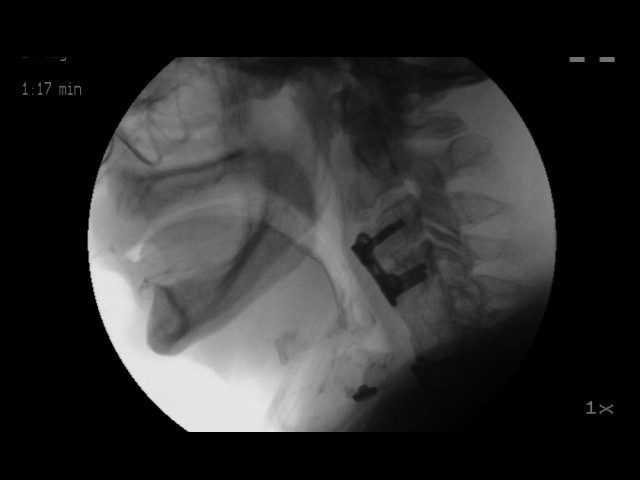

[18 of 24 positions shown; findings below may reference images not displayed]

Canned report from images found in remote index.

Refer to host system for actual result text.

## 2015-04-06 IMAGING — CR DG KNEE COMPLETE 4+V*R*
5 series · 5 of 5 positions shown · non-contrast
Comparison: Right knee MR dated 02/08/2008.

CLINICAL DATA: Right anterior and posterior knee pain for 1 week;
pt states he has had intermittent right knee pain for 15 yrs and has
been receiving steroid shots but no surgery; no known injury

EXAM:
RIGHT KNEE - COMPLETE 4+ VIEW

[knee ap]
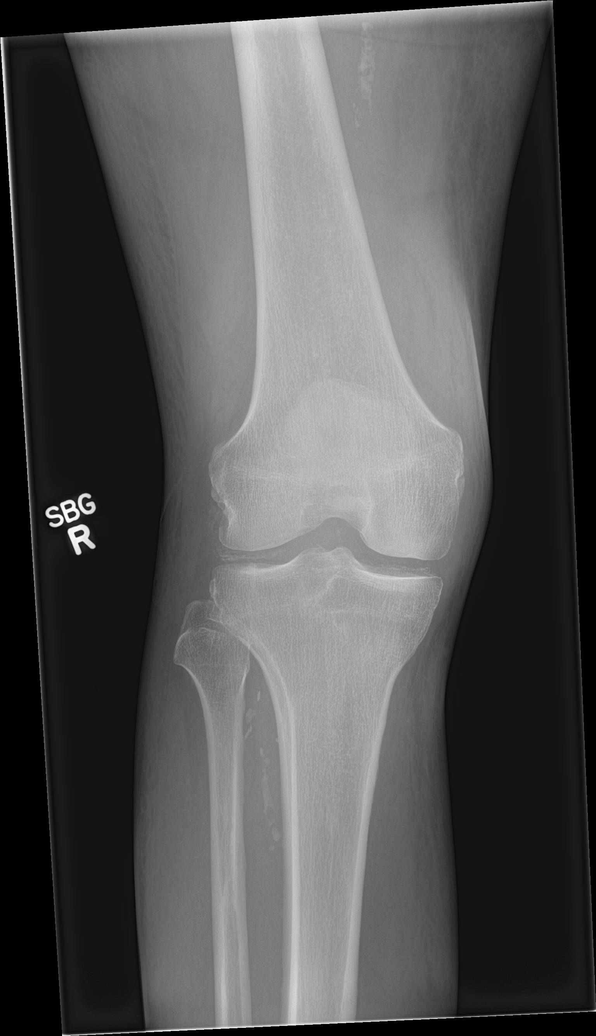

[knee obl (1 of 2)]
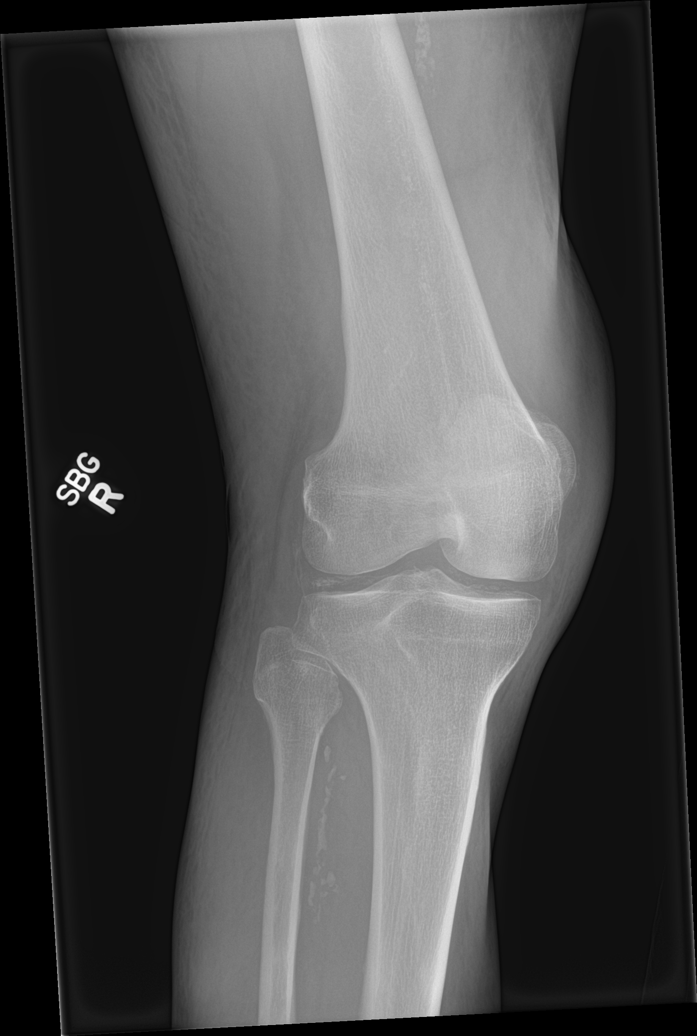

[knee obl (2 of 2)]
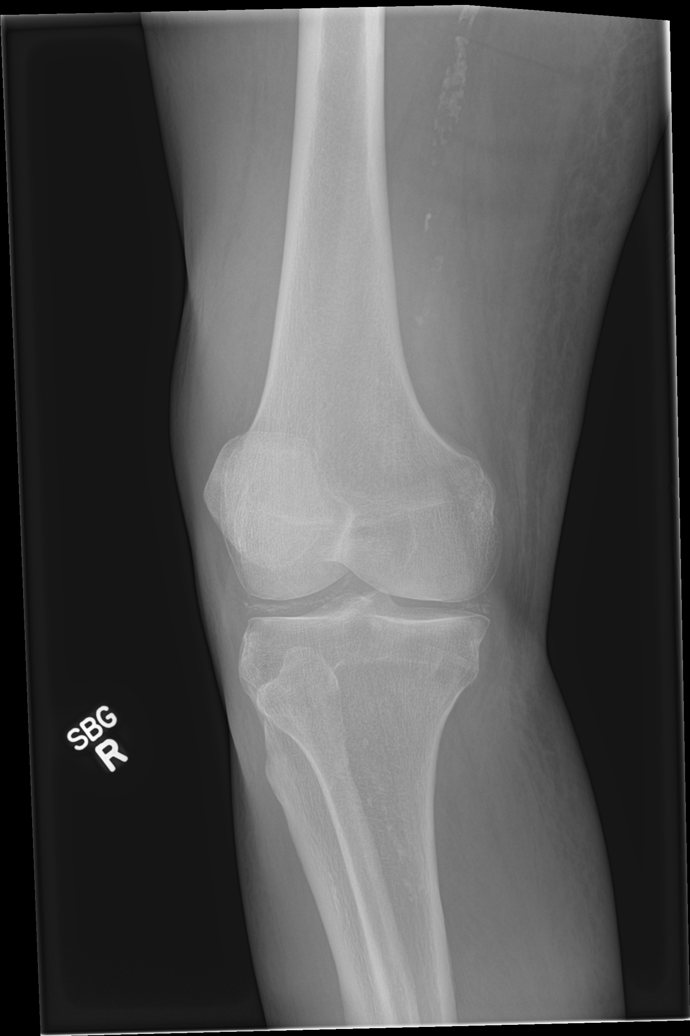

[knee lat (1 of 2)]
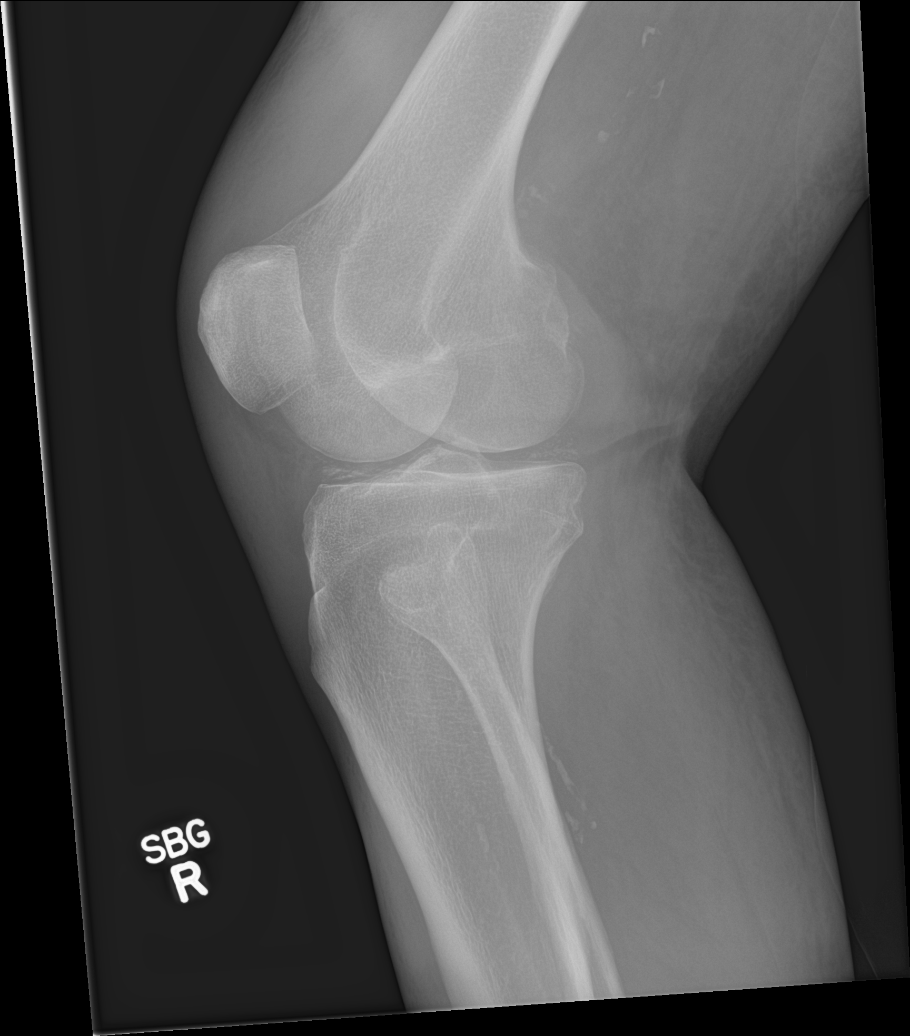

[knee lat (2 of 2)]
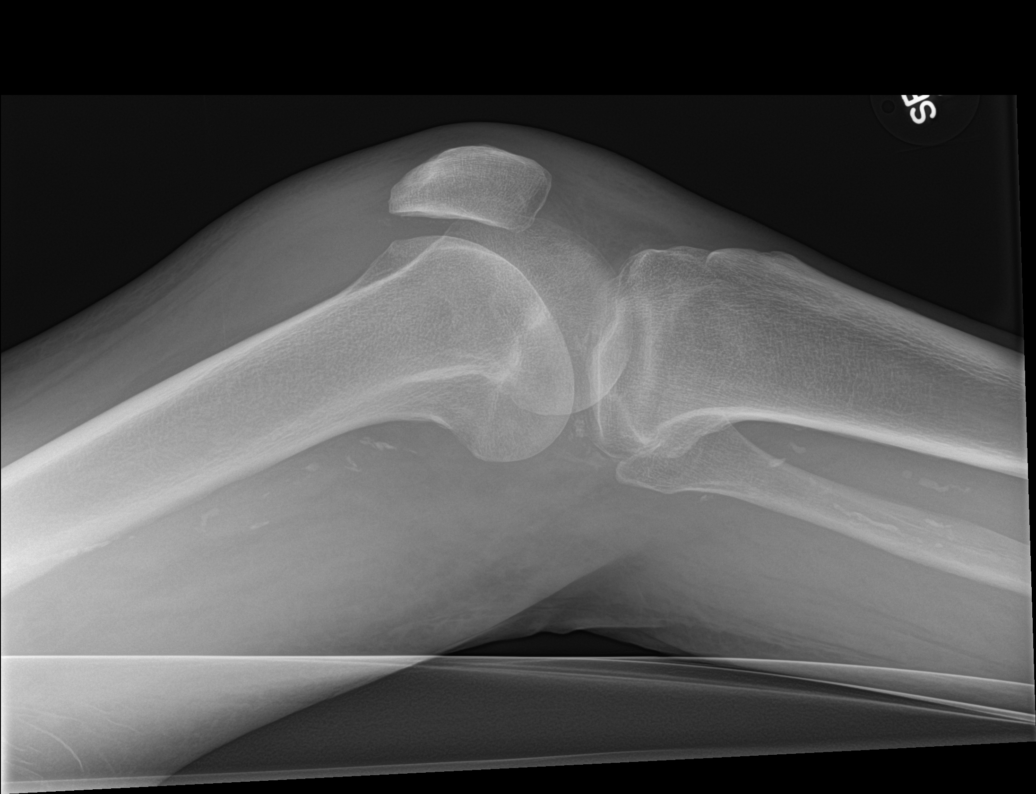

[5 of 5 positions shown; findings below may reference images not displayed]

FINDINGS: Medial and lateral meniscal calcifications. Moderate sized effusion.
Diffuse soft tissue swelling, most pronounced anteriorly.
Atheromatous arterial calcifications.
IMPRESSION: 1. Diffuse soft tissue swelling with a moderate-sized effusion.
2. Chondrocalcinosis.

## 2015-05-19 ENCOUNTER — Telehealth: Payer: Self-pay | Admitting: Neurology

## 2015-05-19 NOTE — Telephone Encounter (Signed)
Patient reports new onset of bil numbness in all fingers and back of the arms.  An appointment was made for 06/30/15 with Dr [name]. Advised that the nurse would call if there was any other questions.

## 2015-05-19 NOTE — Telephone Encounter (Signed)
Returned call to patient - left message letting him know to call back, if he would like to have his appt moved to an earlier date.

## 2015-06-30 ENCOUNTER — Ambulatory Visit (INDEPENDENT_AMBULATORY_CARE_PROVIDER_SITE_OTHER): Payer: Medicare Other | Admitting: Neurology

## 2015-06-30 ENCOUNTER — Encounter: Payer: Self-pay | Admitting: Neurology

## 2015-06-30 VITALS — BP 144/75 | HR 64 | Ht 66.0 in | Wt 173.0 lb

## 2015-06-30 DIAGNOSIS — I2510 Atherosclerotic heart disease of native coronary artery without angina pectoris: Secondary | ICD-10-CM

## 2015-06-30 DIAGNOSIS — M542 Cervicalgia: Secondary | ICD-10-CM | POA: Insufficient documentation

## 2015-06-30 DIAGNOSIS — E538 Deficiency of other specified B group vitamins: Secondary | ICD-10-CM

## 2015-06-30 DIAGNOSIS — R202 Paresthesia of skin: Secondary | ICD-10-CM | POA: Diagnosis not present

## 2015-06-30 MED ORDER — PREGABALIN 75 MG PO CAPS: 75.0000 mg | ORAL_CAPSULE | Freq: Four times a day (QID) | ORAL | Status: DC

## 2015-06-30 NOTE — Progress Notes (Signed)
Chief Complaint  Patient presents with  . Peripheral Neuropathy    States the intermittent burning and tingling in his bilateral feet is much better.  . Migraines    Reports only having three severe migraines since last seen in 11/2014.  They respond will to [place].  . Numbness    He is concerned about the development of numbness in his fingertips since March.  . Neck Pain    Reports neck pain radiating into his shoulders, along with burning sensations.      PATIENT: Rick Church DOB: 1940/05/15  Chief Complaint  Patient presents with  . Peripheral Neuropathy    States the intermittent burning and tingling in his bilateral feet is much better.  . Migraines    Reports only having three severe migraines since last seen in 11/2014.  They respond will to [place].  . Numbness    He is concerned about the development of numbness in his fingertips since March.  . Neck Pain    Reports neck pain radiating into his shoulders, along with burning sensations.    HISTORICAL  Rick Church is a 75 years old right-handed male, seen in refer by her primary care physician Dr. Antony Blackbird for severe headaches in June 2016.  I have reviewed most recent office visit from Dr. 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  I have personally reviewed CAT  scan of the brain: No focal acute intracranial abnormality identified. Chronic diffuse atrophy. Chronic bilateral periventricular white matter small vessel ischemic change.  CT of cervical spine: No acute fracture or dislocation in the cervical spine. Postsurgical changes of cervical spine.  He reported a history of migraine since 1961, his typical migraine are severe pounding headache with associated light noise sensitivity, Fioricet as needed usually helpful,   Since his hospital admission in November 2015, he has prolonged more frequent headaches, in June 2016, he had 3 severe episodes, right retro-orbital area, headache is so severe, he takes up to 6 tablets of Fioricet without helping,   Previously has tried NSAIDs, tramadol, without helping his headaches,  UPDATE Nov 25 2014: He has taken nortriptyline 20mg  qhs, migraine has much improved, he is taking [place] as needed, which has been helpful  He also complains subacute onset bilateral plantar feet paresthesia since September 2016, numbness tingling, burning sensation, gradually getting worse, going up to his ankle level now, he denies bilateral hands upper extremity paresthesia, he has midline low back pain, no shooting pain, he had a history of cervical, lumbar decompression surgery in the past, he denies bowel and bladder incontinence, gait difficulty due to low back pain, lower extremity paresthesia.  He is now taking Lyrica 75 mg which has been helpful, I have reviewed laboratory evaluation from [place], A1c was 5.8, vitamin B12 was 267  UPDATE June 30 2015: He only has 4 migraines since Nov 2016, Fioricet prn has been very helpful, clonazepam prn has been helpful.  He is now complaining of worsening bilateral feet paresthesia, he was given B12 shot, followed by B12 tabs which has helped his symptoms some  He also complains of worsening neck pain, radiating pain to bilateral shoulder, arm, and bilateral hands, he denies  significant no gait abnormality   We have reviewed cardiac catheter report in December 2015, non-obstructive CAD. Diffuse ectasia/aneurysmal disease. nomal LV function. ild aortic stenosis with gradient less than 10 mm Hg by pullback.    REVIEW OF SYSTEMS: Full 14 system review of systems performed and notable only for fatigue, hearing loss, ringing ears, drooling, insomnia, joint pain, back pain, achy muscles, muscle cramps, walking difficulty, neck pain, stiffness, warm, bruise easily, dizziness, headaches, numbness, agitation, behavior problem, confusion decreased concentration, depression anxiety  ALLERGIES: Allergies  Allergen Reactions  . Atorvastatin Other (See Comments)    leg myalgias  . Codeine Nausea And Vomiting  . Indocin [Indomethacin] Nausea Only and Other (See Comments)    dizzy  . Penicillins Hives    Tolerated a dose of cefepime 12/31/13  . Buspirone     intolerance  . Hytrin [Terazosin]     Intolerance   . Neurontin [Gabapentin]     Intolerance   . Oxycodone Nausea Only  . Restoril [Temazepam]     intolerant  . Toprol Xl [Metoprolol Tartrate]     Intolerant   . Zestril [Lisinopril]     intolerant  . Asa [Aspirin] Itching and Rash    HOME MEDICATIONS: Current Outpatient Prescriptions  Medication Sig Dispense Refill  . amLODipine (NORVASC) 5 MG tablet TK 1 T PO QD TO LOWER BLOOD PRESSURE  4  . budesonide (PULMICORT) 0.5 MG/2ML nebulizer solution Take 2 mLs (0.5 mg total) by nebulization 2 (two) times daily.  12  . butalbital-acetaminophen-caffeine (FIORICET, ESGIC) 50-325-40 MG per tablet Take 1 tablet by mouth daily as needed. migraines  1  . citalopram (CELEXA) 40 MG tablet TK 1 T PO QD  3  . clonazePAM (KLONOPIN) 2 MG tablet Take 2 mg by mouth 2 (two) times daily as needed for anxiety.    . Diclofenac Potassium (CAMBIA) 50 MG PACK As needed for severe migraine 9 each 5  . ibuprofen (ADVIL,MOTRIN) 200 MG tablet Take 200 mg by mouth every 6 (six) hours  as needed.    Marland Kitchen losartan (COZAAR) 50 MG tablet TK 1 T PO QD TO LOWER BLOOD PRESSURE  3  . meclizine (ANTIVERT) 25 MG tablet Take 25 mg by mouth 3 (three) times daily as needed for dizziness.    . Multiple Vitamin (MULTIVITAMIN) capsule Take 1 capsule by mouth daily.    . nortriptyline (PAMELOR) 10 MG capsule TAKE 1 CAPSULE BY MOUTH AT BEDTIME FOR ONE WEEK, THEN INCREASE TO 2 CAPSULES BY MOUTH AT BEDTIME THEREAFTER 60 capsule 11  . pantoprazole (PROTONIX) 40 MG tablet Take 1 tablet (40 mg total) by mouth daily. 30 tablet 0  . Potassium Gluconate 2 MEQ TABS Take by mouth.    . pregabalin (LYRICA) 75 MG capsule Take 1 capsule (75 mg total) by mouth at bedtime. 4 times  A day 120 capsule 5  . simvastatin (ZOCOR) 40 MG tablet Take 40 mg by mouth daily at 6 PM.    . traZODone (DESYREL) 100 MG tablet TK 1 T PO QD HS  6  . valsartan (DIOVAN) 160 MG tablet TK 1 T PO QD  3   No current facility-administered medications for this visit.    PAST MEDICAL HISTORY: Past Medical  History  Diagnosis Date  . High cholesterol   . Hypertension   . Panic attack   . Acute respiratory distress syndrome (ARDS) ([facility])   . Pneumonia   . COPD (chronic obstructive pulmonary disease) ([place])   . Complication of anesthesia   . PONV (postoperative nausea and vomiting)   . History of hiatal hernia   . Headache   . Arthritis   . Ejection fraction   . 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 GERD (gastroesophageal reflux disease)   . Anxiety   . Neck pain   . Migraine   . Legionnaire's disease ([place])     PAST SURGICAL HISTORY: Past Surgical History  Procedure Laterality Date  . Foot neuroma surgery Left 09/29/2101  . Back surgery    . Tracheostomy      feinstein  . Left heart catheterization with coronary angiogram N/A 01/06/2014    Procedure: LEFT HEART CATHETERIZATION WITH CORONARY ANGIOGRAM;  Surgeon: Peter M Martinique, MD;  Location[facility] C[facility] LAB;  Service: Cardiovascular;  Laterality: N/A;  . Tracheostomy closure    . Neck  surgery    . Rotator cuff repair Left   . Shoulder surgery Right 1998, 2002  . Carpal tunnel release Bilateral   . Tonsillectomy      FAMILY HISTORY: Family History  Problem Relation Age of Onset  . Hypertension Mother   . Brain cancer Mother   . Lung cancer Mother   . Hypertension Brother   . Cervical cancer Sister   . Hypertension Father   . Cerebral aneurysm Father     SOCIAL HISTORY:  Social History   Social History  . Marital Status: Married    Spouse Name: N/A  . Number of Children: 0  . Years of Education: GED   Occupational History  . Disabled    Social History Main Topics  . Smoking status: Current Some Day Smoker -- 1.50 packs/day    Types: Cigarettes    Start date: 11/14/1956    Last Attempt to Quit: 11/08/2013  . Smokeless tobacco: Never Used     Comment: smokes 4 cigarettes daily  . Alcohol Use: No  . Drug Use: No  . Sexual Activity: Not on file   Other Topics Concern  . Not on file   Social History Narrative   Lives at home alone.   Right-handed.   Drinks approximately 1 liter of Dr. Malachi Bonds per day.     PHYSICAL EXAM   Filed Vitals:   06/30/15 1418  BP: 144/75  Pulse: 64  Height: 5\' 6"  (1.676 m)  Weight: 173 lb (78.472 kg)    Not recorded      Body mass index is 27.94 kg/(m^2).  PHYSICAL EXAMNIATION:  Gen: NAD, conversant, well nourised, obese, well groomed                     Cardiovascular: Regular rate rhythm, no peripheral edema, warm, nontender. Eyes: Conjunctivae clear without exudates or hemorrhage Neck: Supple, no carotid bruise. Pulmonary: Clear to auscultation bilaterally   NEUROLOGICAL EXAM:  MENTAL STATUS: Speech:    Speech is normal; fluent and spontaneous with normal comprehension.  Cognition:    The patient is oriented to person, place, and time;     recent and remote memory intact;     language fluent;     normal attention, concentration,     fund of knowledge.  CRANIAL NERVES: CN II: Visual fields  are full to confrontation. Fundoscopic exam is normal with  sharp discs, pupils were equal round reactive to light.  CN III, IV, VI: extraocular movement are normal. No ptosis. CN V: Facial sensation is intact to pinprick in all 3 divisions bilaterally. Corneal responses are intact.  CN VII: Face is symmetric with normal eye closure and smile. CN VIII: Hearing is normal to rubbing fingers CN IX, X: Palate elevates symmetrically. Phonation is normal. CN XI: Head turning and shoulder shrug are intact CN XII: Tongue is midline with normal movements and no atrophy.  MOTOR: There is no pronator drift of out-stretched arms. Muscle bulk and tone are normal. Muscle strength is normal.  REFLEXES: Reflexes are 1 and symmetric at the biceps, triceps, knees, and absent at ankles. Plantar responses are flexor.  SENSORY: Length dependent decreased to light touch, pinprick and vibratory sensation at ankle level   COORDINATION: Rapid alternating movements and fine finger movements are intact. There is no dysmetria on finger-to-nose and heel-knee-shin. There are no abnormal or extraneous movements.   GAIT/STANCE: Need to push up to get up from seated position, bending forward, mildly antalgic  DIAGNOSTIC DATA (LABS, IMAGING, TESTING) - I reviewed patient records, labs, notes, testing and imaging myself where available.   ASSESSMENT AND PLAN  Rick Church is a 75 y.o. male   Migraine headaches:   Nortriptyline 10 mg, titrating to 20 mg every day for preventive medications  Fioricet as needed has been very helpful  Bilateral hands paresthesia, neck pain,  History of cervical decompression surgery, differentiation diagnosis includes cervical radiculopathy, bilateral hands neuropathy,  EMG nerve conduction study  MRI of cervical spine   Marcial Pacas, M.D. Ph.D.  [facility] [address], [place], [place] [address] Ph: [phone] Fax: [phone]

## 2015-07-27 ENCOUNTER — Other Ambulatory Visit: Payer: Self-pay | Admitting: Neurology

## 2015-07-27 ENCOUNTER — Encounter: Payer: Medicare Other | Admitting: Neurology

## 2015-07-27 ENCOUNTER — Telehealth: Payer: Self-pay | Admitting: *Deleted
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
## 2015-07-28 ENCOUNTER — Encounter (HOSPITAL_COMMUNITY): Payer: Self-pay | Admitting: *Deleted

## 2015-07-28 ENCOUNTER — Emergency Department (HOSPITAL_COMMUNITY)
Admission: EM | Admit: 2015-07-28 | Discharge: 2015-07-28 | Disposition: A | Discharge status: Home / Self Care | Payer: No Typology Code available for payment source | Attending: Emergency Medicine | Admitting: Emergency Medicine

## 2015-07-28 ENCOUNTER — Emergency Department (HOSPITAL_COMMUNITY): Payer: No Typology Code available for payment source

## 2015-07-28 DIAGNOSIS — I714 Abdominal aortic aneurysm, without rupture, unspecified: Secondary | ICD-10-CM

## 2015-07-28 DIAGNOSIS — Y939 Activity, unspecified: Secondary | ICD-10-CM | POA: Insufficient documentation

## 2015-07-28 DIAGNOSIS — I10 Essential (primary) hypertension: Secondary | ICD-10-CM | POA: Insufficient documentation

## 2015-07-28 DIAGNOSIS — Z7982 Long term (current) use of aspirin: Secondary | ICD-10-CM | POA: Diagnosis not present

## 2015-07-28 DIAGNOSIS — Y999 Unspecified external cause status: Secondary | ICD-10-CM | POA: Diagnosis not present

## 2015-07-28 DIAGNOSIS — J449 Chronic obstructive pulmonary disease, unspecified: Secondary | ICD-10-CM | POA: Diagnosis not present

## 2015-07-28 DIAGNOSIS — R51 Headache: Secondary | ICD-10-CM | POA: Diagnosis not present

## 2015-07-28 DIAGNOSIS — F17210 Nicotine dependence, cigarettes, uncomplicated: Secondary | ICD-10-CM | POA: Diagnosis not present

## 2015-07-28 DIAGNOSIS — Y92410 Unspecified street and highway as the place of occurrence of the external cause: Secondary | ICD-10-CM | POA: Insufficient documentation

## 2015-07-28 DIAGNOSIS — M5441 Lumbago with sciatica, right side: Secondary | ICD-10-CM | POA: Diagnosis not present

## 2015-07-28 DIAGNOSIS — M545 Low back pain: Secondary | ICD-10-CM | POA: Diagnosis present
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
## 2015-07-28 NOTE — ED Notes (Signed)
MD in room to speak with patient about results from his x-rays and CT scans and patient very upset and yelling/cursing because "I can't find my damn cain" This RN asked the patient if he brought his cain with him when the EMS picked him up and he said to this RN "Are you calling me a liar too?" This RN reinforced that I am trying to fix the problem. The patient stated "The Dr just came in and told me they didn't find anything so there ain't no damn reason in me being here so now I want you to get me the f--- out of here. I just don't understand why I'm still having double vision" Patient provided with wheelchair and d/c papers and while escorting patient out to the waiting room to wait for his ride the patient demanded to be given a cain by the hospital and this RN was in the process of getting the patient a cain to use until his transportation gets here and the patient got out of the wheelchair and walked outside. ..... The patient was able to ambulate on his own with a limp which is the patient's norm and this RN attempted to assist the patient to the bench outside of the waiting room and the pt yelled "get your f---ing hands off of me I don't care if I fall I just want to get the f--- out of here" Pt ambulated to the bus stop to wait for his ride successfully. Pt alert and oriented x 4. EMS contacted about pt's cain.
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
## 2015-07-29 ENCOUNTER — Telehealth: Payer: Self-pay | Admitting: *Deleted

## 2015-07-29 NOTE — Telephone Encounter (Signed)
[place] approved by [employer] [phone]) through 01/10/16 - pt [id number].

## 2015-08-07 ENCOUNTER — Ambulatory Visit (INDEPENDENT_AMBULATORY_CARE_PROVIDER_SITE_OTHER): Payer: Medicare Other | Admitting: Neurology

## 2015-08-07 ENCOUNTER — Encounter: Payer: Self-pay | Admitting: Neurology

## 2015-08-07 VITALS — BP 178/75 | HR 63 | Ht 67.0 in | Wt 172.8 lb

## 2015-08-07 DIAGNOSIS — F0781 Postconcussional syndrome: Secondary | ICD-10-CM | POA: Diagnosis not present

## 2015-08-07 MED ORDER — NORTRIPTYLINE HCL 10 MG PO CAPS: 30.0000 mg | ORAL_CAPSULE | Freq: Every day | ORAL | 11 refills | Status: DC
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
## 2015-08-07 NOTE — Patient Instructions (Signed)
Remember to drink plenty of fluid, eat healthy meals and do not skip any meals. Try to eat protein with a every meal and eat a healthy snack such as fruit or nuts in between meals. Try to keep a regular sleep-wake schedule and try to exercise daily, particularly in the form of walking, 20-30 minutes a day, if you can.   As far as your medications are concerned, I would like to suggest: Increase Nortriptyline to 30mg  at night. Klonopin and meclizine can help with dizziness.    Follow up with Dr. [name] in 2 months  Our phone number is [phone]. We also have an after hours call service for urgent matters and there is a physician on-call for urgent questions. For any emergencies you know to call 911 or go to the nearest emergency room   Concussion, Adult A concussion, or closed-head injury, is a brain injury caused by a direct blow to the head or by a quick and sudden movement (jolt) of the head or neck. Concussions are usually not life-threatening. Even so, the effects of a concussion can be serious. If you have had a concussion before, you are more likely to experience concussion-like symptoms after a direct blow to the head.  CAUSES  Direct blow to the head, such as from running into another player during a soccer game, being hit in a fight, or hitting your head on a hard surface.  A jolt of the head or neck that causes the brain to move back and forth inside the skull, such as in a car crash. SIGNS AND SYMPTOMS The signs of a concussion can be hard to notice. Early on, they may be missed by you, family members, and health care providers. You may look fine but act or feel differently. Symptoms are usually temporary, but they may last for days, weeks, or even longer. Some symptoms may appear right away while others may not show up for hours or days. Every head injury is different. Symptoms include:  Mild to moderate headaches that will not go away.  A feeling of pressure inside your  head.  Having more trouble than usual:  Learning or remembering things you have heard.  Answering questions.  Paying attention or concentrating.  Organizing daily tasks.  Making decisions and solving problems.  Slowness in thinking, acting or reacting, speaking, or reading.  Getting lost or being easily confused.  Feeling tired all the time or lacking energy (fatigued).  Feeling drowsy.  Sleep disturbances.  Sleeping more than usual.  Sleeping less than usual.  Trouble falling asleep.  Trouble sleeping (insomnia).  Loss of balance or feeling lightheaded or dizzy.  Nausea or vomiting.  Numbness or tingling.  Increased sensitivity to:  Sounds.  Lights.  Distractions.  Vision problems or eyes that tire easily.  Diminished sense of taste or smell.  Ringing in the ears.  Mood changes such as feeling sad or anxious.  Becoming easily irritated or angry for little or no reason.  Lack of motivation.  Seeing or hearing things other people do not see or hear (hallucinations). DIAGNOSIS Your health care provider can usually diagnose a concussion based on a description of your injury and symptoms. He or she will ask whether you passed out (lost consciousness) and whether you are having trouble remembering events that happened right before and during your injury. Your evaluation might include:  A brain scan to look for signs of injury to the brain. Even if the test shows no injury, you may  still have a concussion.  Blood tests to be sure other problems are not present. TREATMENT  Concussions are usually treated in an emergency department, in urgent care, or at a clinic. You may need to stay in the hospital overnight for further treatment.  Tell your health care provider if you are taking any medicines, including prescription medicines, over-the-counter medicines, and natural remedies. Some medicines, such as blood thinners (anticoagulants) and aspirin, may  increase the chance of complications. Also tell your health care provider whether you have had alcohol or are taking illegal drugs. This information may affect treatment.  Your health care provider will send you home with important instructions to follow.  How fast you will recover from a concussion depends on many factors. These factors include how severe your concussion is, what part of your brain was injured, your age, and how healthy you were before the concussion.  Most people with mild injuries recover fully. Recovery can take time. In general, recovery is slower in older persons. Also, persons who have had a concussion in the past or have other medical problems may find that it takes longer to recover from their current injury. HOME CARE INSTRUCTIONS General Instructions  Carefully follow the directions your health care provider gave you.  Only take over-the-counter or prescription medicines for pain, discomfort, or fever as directed by your health care provider.  Take only those medicines that your health care provider has approved.  Do not drink alcohol until your health care provider says you are well enough to do so. Alcohol and certain other drugs may slow your recovery and can put you at risk of further injury.  If it is harder than usual to remember things, write them down.  If you are easily distracted, try to do one thing at a time. For example, do not try to watch TV while fixing dinner.  Talk with family members or close friends when making important decisions.  Keep all follow-up appointments. Repeated evaluation of your symptoms is recommended for your recovery.  Watch your symptoms and tell others to do the same. Complications sometimes occur after a concussion. Older adults with a brain injury may have a higher risk of serious complications, such as a blood clot on the brain.  Tell your teachers, school nurse, school counselor, coach, athletic trainer, or work  Freight forwarder about your injury, symptoms, and restrictions. Tell them about what you can or cannot do. They should watch for:  Increased problems with attention or concentration.  Increased difficulty remembering or learning new information.  Increased time needed to complete tasks or assignments.  Increased irritability or decreased ability to cope with stress.  Increased symptoms.  Rest. Rest helps the brain to heal. Make sure you:  Get plenty of sleep at night. Avoid staying up late at night.  Keep the same bedtime hours on weekends and weekdays.  Rest during the day. Take daytime naps or rest breaks when you feel tired.  Limit activities that require a lot of thought or concentration. These include:  Doing homework or job-related work.  Watching TV.  Working on the computer.  Avoid any situation where there is potential for another head injury (football, hockey, soccer, basketball, martial arts, downhill snow sports and horseback riding). Your condition will get worse every time you experience a concussion. You should avoid these activities until you are evaluated by the appropriate follow-up health care providers. Returning To Your Regular Activities You will need to return to your normal activities slowly,  not all at once. You must give your body and brain enough time for recovery.  Do not return to sports or other athletic activities until your health care provider tells you it is safe to do so.  Ask your health care provider when you can drive, ride a bicycle, or operate heavy machinery. Your ability to react may be slower after a brain injury. Never do these activities if you are dizzy.  Ask your health care provider about when you can return to work or school. Preventing Another Concussion It is very important to avoid another brain injury, especially before you have recovered. In rare cases, another injury can lead to permanent brain damage, brain swelling, or death. The  risk of this is greatest during the first 7-10 days after a head injury. Avoid injuries by:  Wearing a seat belt when riding in a car.  Drinking alcohol only in moderation.  Wearing a helmet when biking, skiing, skateboarding, skating, or doing similar activities.  Avoiding activities that could lead to a second concussion, such as contact or recreational sports, until your health care provider says it is okay.  Taking safety measures in your home.  Remove clutter and tripping hazards from floors and stairways.  Use grab bars in bathrooms and handrails by stairs.  Place non-slip mats on floors and in bathtubs.  Improve lighting in dim areas. SEEK MEDICAL CARE IF:  You have increased problems paying attention or concentrating.  You have increased difficulty remembering or learning new information.  You need more time to complete tasks or assignments than before.  You have increased irritability or decreased ability to cope with stress.  You have more symptoms than before. Seek medical care if you have any of the following symptoms for more than 2 weeks after your injury:  Lasting (chronic) headaches.  Dizziness or balance problems.  Nausea.  Vision problems.  Increased sensitivity to noise or light.  Depression or mood swings.  Anxiety or irritability.  Memory problems.  Difficulty concentrating or paying attention.  Sleep problems.  Feeling tired all the time. SEEK IMMEDIATE MEDICAL CARE IF:  You have severe or worsening headaches. These may be a sign of a blood clot in the brain.  You have weakness (even if only in one hand, leg, or part of the face).  You have numbness.  You have decreased coordination.  You vomit repeatedly.  You have increased sleepiness.  One pupil is larger than the other.  You have convulsions.  You have slurred speech.  You have increased confusion. This may be a sign of a blood clot in the brain.  You have increased  restlessness, agitation, or irritability.  You are unable to recognize people or places.  You have neck pain.  It is difficult to wake you up.  You have unusual behavior changes.  You lose consciousness. MAKE SURE YOU:  Understand these instructions.  Will watch your condition.  Will get help right away if you are not doing well or get worse.   This information is not intended to replace advice given to you by your health care provider. Make sure you discuss any questions you have with your health care provider.   Document Released: 03/19/2003 Document Revised: 01/17/2014 Document Reviewed: 07/19/2012 Elsevier Interactive Patient Education [employer].

## 2015-08-12 ENCOUNTER — Telehealth: Payer: Self-pay | Admitting: Neurology

## 2015-08-12 DIAGNOSIS — Z9181 History of falling: Secondary | ICD-10-CM

## 2015-08-12 DIAGNOSIS — F0781 Postconcussional syndrome: Secondary | ICD-10-CM

## 2015-08-12 NOTE — Telephone Encounter (Signed)
Called and spoke to pt. Advised we placed referral to PT as requested. He should hear within a week or two to scheduled appt. If he does not, he is to call me back. I want to make sure he gets an appointment.  He verbalized understanding.

## 2015-08-18 ENCOUNTER — Encounter: Payer: Self-pay | Admitting: Physical Therapy

## 2015-08-18 ENCOUNTER — Ambulatory Visit: Payer: Medicare Other | Attending: Neurology | Admitting: Physical Therapy

## 2015-08-18 VITALS — BP 163/75 | HR 58

## 2015-08-18 DIAGNOSIS — R2681 Unsteadiness on feet: Secondary | ICD-10-CM | POA: Diagnosis present

## 2015-08-18 DIAGNOSIS — R42 Dizziness and giddiness: Secondary | ICD-10-CM | POA: Diagnosis present

## 2015-08-18 DIAGNOSIS — R2689 Other abnormalities of gait and mobility: Secondary | ICD-10-CM | POA: Insufficient documentation
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
## 2015-09-07 ENCOUNTER — Ambulatory Visit: Payer: Medicare Other | Admitting: Physical Therapy

## 2015-09-07 ENCOUNTER — Telehealth: Payer: Self-pay

## 2015-09-07 VITALS — BP 156/81 | HR 57

## 2015-09-07 DIAGNOSIS — R2681 Unsteadiness on feet: Secondary | ICD-10-CM

## 2015-09-07 DIAGNOSIS — R2689 Other abnormalities of gait and mobility: Secondary | ICD-10-CM

## 2015-09-07 DIAGNOSIS — R42 Dizziness and giddiness: Secondary | ICD-10-CM

## 2015-09-07 NOTE — Telephone Encounter (Addendum)
Rick Church from front desk contacted [name] and work in Marine scientist for patient. He was brought from rehab in wheelchair because of color change and pain while doing rehab. The work in [place] took patient to exam room for Dr.Sethi to evaluate him. Dr .[name] and Dr. [name] saw patient in June and July for neurological issues. [name] explain to patient we can call the ED. The pt refused the ED or ambulance service. Vital signs were taken. Pts vital signs were 152/79, heart rate 60, and O2 was 95, RR was 24. In the next 5 to 6 minutes pts blood pressure 138/79 , HR is 59, and 99 percent oxygen level. Pt stated he felt he nausea when the episode happen in rehab. Pt stated he would go to  [facility] urgent care to be seen. Pt stated he had valuables in his trunk,and he did not want to leave the car.
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
## 2015-09-07 NOTE — Telephone Encounter (Signed)
Rn call Kennyth Lose at [facility] about patients episode at neurorehab. Kennyth Lose is Dr. Antony Blackbird assistance. Rn explain pt was seen by neurorehab and brought in a wheelchair to the front desk. Rn stated pts vital signs were taken and he was look at by Dr. Leonie Man the work in [place]. Shelly Bombard explain pt refuse the ambulance. Pt also refuse for a friend to come get his car and take him the ED or PCP. Rn gave [place] vital signs of the patient. Rn stated pt was going to be seen at the [facility] urgent clinic once leaving [facility]. Kennyth Lose will let pts PCP aware of what was going on. Rn also explain pt did not have an appt with [facility] but was seen in the last two months.
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
## 2015-09-09 ENCOUNTER — Telehealth: Payer: Self-pay | Admitting: Physical Therapy

## 2015-09-09 ENCOUNTER — Other Ambulatory Visit: Payer: Self-pay | Admitting: Neurology

## 2015-09-09 ENCOUNTER — Ambulatory Visit: Payer: Medicare Other | Admitting: Physical Therapy

## 2015-09-09 DIAGNOSIS — R2681 Unsteadiness on feet: Secondary | ICD-10-CM

## 2015-09-09 DIAGNOSIS — R42 Dizziness and giddiness: Secondary | ICD-10-CM

## 2015-09-09 DIAGNOSIS — R2689 Other abnormalities of gait and mobility: Secondary | ICD-10-CM

## 2015-09-09 DIAGNOSIS — F0781 Postconcussional syndrome: Secondary | ICD-10-CM

## 2015-09-09 NOTE — Telephone Encounter (Signed)
Thank you Benjie Karvonen! This is Dr. Rhea Belton patient, I only saw him because Dr. [name] was out of the office. I will order OT. I think he should have a follow up with Dr. [name] and I will ask her nurse to schedule that to discuss all of this with him. Thank you SO much!

## 2015-09-09 NOTE — Telephone Encounter (Signed)
Dr. [name], I've been seeing Rick Church for vestibular PT at outpatient neuro. I sent the following in-basket on 08/18/15, but I'm not sure if you received it:  "PT evaluation was completed for Rick Church today. The patient was very unsteady on his feet. He was staggering, holding onto walls while ambulating into/out of the clinic. He seemed agitated with increased visual and auditory stimuli.   I'm most concerned with the patient's poor judgement and lack of awareness. He is driving despite having diplopia and significant dizziness with head movement. He told me he sometimes has to crawl to get around his yard.   Do you think this patient would be an appropriate referral for Dr. Naaman Plummer? I know a lot of my patients who've sustained TBI's see Dr. Naaman Plummer for medication management. I'm not sure if this is an appropriate suggestion, but let me know what you think.   Also, I think the patient may benefit from an OT evaluation to further assess/address visual impairments and cognition. If you agree, please place an order for OT."  The patient's first PT visit since initial evaluation was on 8/28. During this visit, the patient's gait instability was worse than on evaluation and the patient threatened to commit suicide after leaving PT. Pt refused to use ambulance to go to ED (as patient not safe to drive) and refused to go to any urgent care. I contacted his primary care provide, Dr. Antony Blackbird, who was able to help me convince the patient to see a provider at [name]. Dr. Leonie Man saw the patient and also recommended pt take ambulance to ED, but pt declined.  When patient returned to PT today, I discussed discontinuing PT, as patient's symptoms do not appear to be originating from the vestibular system and his presentation no longer consistent with concussion. When DC from PT was discussed, patient again eluded to plan to commit suicide after leaving PT. I was unable to convince the patient to seek medical attention to  address suicide risk or for further medical workup.  I'm planning to discharge the patient, as PT no longer seems appropriate.  The patient has fallen multiple times since you evaluated him, and I know he has sustained head trauma in said falls. I feel as though the patient needs further workup to rule out another origin of dizziness.  Please let me know you received this message. I'd appreciate any insight/suggestions. Thanks in advance for your help. Billie Ruddy, PT, [facility] [address] [place], [place], [address] Phone: [phone]   Fax:  [phone] 09/09/15, 1:19 PM
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
## 2015-09-09 NOTE — Telephone Encounter (Signed)
He has a pending appt for NCV/EMG on 09/18/15.

## 2015-09-11 ENCOUNTER — Encounter: Payer: Medicare Other | Admitting: Neurology

## 2015-09-11 ENCOUNTER — Telehealth: Payer: Self-pay | Admitting: Physical Therapy

## 2015-09-11 NOTE — Telephone Encounter (Signed)
Left voicemail explaining that this PT has reached out the physician who referred patient to physical therapy, Dr. [name], about my concerns with worsening symptoms. I let her know that I'm recommending further medical testing and explained my rationale. Dr. [name] forwarded my message to Dr. [name], the patient's primary provider at [facility]. Notified patient that if Dr. Rhea Belton nurse doesn't contact him before his appointment on 9/8, that pt should plan to speak with the physician about dizziness and other symptoms. This PT explained on the voicemail that because vestibular rehab is not appropriate at this time, remaining PT appointments will be cancelled.  Encouraged the patient to contact outpatient neuro rehab with any further questions or concerns.  Billie Ruddy, PT, DPT [facility] [address] [place], [place], [address] Phone: [phone]   Fax:  [phone] 09/11/15, 8:25 AM

## 2015-09-15 ENCOUNTER — Encounter: Payer: Medicare Other | Admitting: Rehabilitative and Restorative Service Providers"

## 2015-09-18 ENCOUNTER — Encounter: Payer: Medicare Other | Admitting: Rehabilitative and Restorative Service Providers"

## 2015-09-18 ENCOUNTER — Ambulatory Visit (INDEPENDENT_AMBULATORY_CARE_PROVIDER_SITE_OTHER): Payer: Self-pay

## 2015-09-18 ENCOUNTER — Ambulatory Visit (INDEPENDENT_AMBULATORY_CARE_PROVIDER_SITE_OTHER): Payer: Medicare Other | Admitting: Neurology

## 2015-09-18 DIAGNOSIS — M4806 Spinal stenosis, lumbar region: Secondary | ICD-10-CM

## 2015-09-18 DIAGNOSIS — R269 Unspecified abnormalities of gait and mobility: Secondary | ICD-10-CM | POA: Diagnosis not present

## 2015-09-18 DIAGNOSIS — M542 Cervicalgia: Secondary | ICD-10-CM

## 2015-09-18 DIAGNOSIS — E538 Deficiency of other specified B group vitamins: Secondary | ICD-10-CM

## 2015-09-18 DIAGNOSIS — R42 Dizziness and giddiness: Secondary | ICD-10-CM | POA: Diagnosis not present

## 2015-09-18 DIAGNOSIS — R519 Headache, unspecified: Secondary | ICD-10-CM

## 2015-09-18 DIAGNOSIS — R202 Paresthesia of skin: Secondary | ICD-10-CM

## 2015-09-18 DIAGNOSIS — Z0289 Encounter for other administrative examinations: Secondary | ICD-10-CM

## 2015-09-18 DIAGNOSIS — M48061 Spinal stenosis, lumbar region without neurogenic claudication: Secondary | ICD-10-CM

## 2015-09-18 DIAGNOSIS — R51 Headache: Secondary | ICD-10-CM

## 2015-09-18 DIAGNOSIS — I2510 Atherosclerotic heart disease of native coronary artery without angina pectoris: Secondary | ICD-10-CM
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
## 2015-09-22 ENCOUNTER — Encounter: Payer: Medicare Other | Admitting: Rehabilitative and Restorative Service Providers"

## 2015-09-23 ENCOUNTER — Encounter: Payer: Self-pay | Admitting: Physical Therapy

## 2015-09-23 NOTE — Therapy (Signed)
[facility] [address] [place], [place], [address] Phone: [phone]   Fax:  [phone]  Patient Details  Name: Rick Church MRN: [id number] Date of Birth: Aug 13, 1940 Referring Provider:  Sarina Ill, MD Encounter Date: 10-10-15  PHYSICAL THERAPY DISCHARGE SUMMARY  Visits from Start of Care: 3  Current functional level related to goals / functional outcomes: LTG's not formally assessed due to change in symptoms/presentation, suggesting pt should be seen by a physician for further medical workup.      PT Long Term Goals - 08/18/15 1932      PT LONG TERM GOAL #1   Title Pt will perform home exercises with mod I using paper handout to maximize functional gain made in PT.  (Target date: 09/15/15)     PT LONG TERM GOAL #2   Title Pt will verbalize understanding of 2 strategies for management of symptoms during functional mobility.  (09/15/15)     PT LONG TERM GOAL #3   Title Pt will tolerate crowded therapy gym x30 minutes to indicate increased pt tolerance to visual/auditory stimuli.  (09/15/15)     PT LONG TERM GOAL #4   Title Pt will ambulate 500' over level, indoor and unlevel, outdoor surfaces with intermittent functional head turns with mod I using LRAD to indicate increased safety with mobility.  (09/15/15)     PT LONG TERM GOAL #5   Title Pt will participate in all above activities with no more than 2-point increase (on 10-point scale) in symptoms to indicate increased pt tolerance to functional activities.  (09/15/15)        Remaining deficits: Gait instability; constant dizziness at rest which does not change with head/body movement, per patient.    Education / Equipment: PT provided extensive education on PT recommendations to seek medical attention for worsening symptoms. See physical therapy notes for details.   Plan: Patient agrees to discharge.  Patient goals were not met. Patient is being discharged  due to a change in medical status.  ?????           G-Codes - 10/10/2015 0844    Functional Assessment Tool Used Clinical judgement; requires min A for all ambulation over level, indoor surfaces   Functional Limitation Mobility: Walking and moving around   Mobility: Walking and Moving Around Goal Status [phone]) At least 20 percent but less than 40 percent impaired, limited or restricted   Mobility: Walking and Moving Around Discharge Status [phone]) At least 40 percent but less than 60 percent impaired, limited or restricted                                            Billie Ruddy, PT, DPT [facility] [address] [place], [place], [address] Phone: [phone]   Fax:  [phone] 10-Oct-2015, 8:44 AM

## 2015-09-25 ENCOUNTER — Encounter: Payer: Medicare Other | Admitting: Rehabilitative and Restorative Service Providers"

## 2015-09-29 ENCOUNTER — Telehealth: Payer: Self-pay | Admitting: Neurology

## 2015-09-29 NOTE — Telephone Encounter (Signed)
Spoke to patient's brother, Korrey Copps.  States patient's mood is better but he has not agreed to any further medical treatment.  Joe talks with him frequently and will continue to encourage him to see his PCP for further help.  His appointment at our office has been canceled.

## 2015-09-29 NOTE — Telephone Encounter (Signed)
Please call patient's brother,  extensive neurological evaluation reviewed, CT cervical spine, MRI of lumbar, EMG nerve conduction study showed no significant neurological condition,  Based on reviewing multiple previous office note, PT documentation, personal encounter on September 8th 2017, patient reported significant mood disturbance, showed unstable behavior during his clinic encounter,  He should continue seeking medical care for better depression control, no neurological follow up is needed at this point.

## 2015-10-05 ENCOUNTER — Encounter (HOSPITAL_COMMUNITY): Payer: Self-pay | Admitting: Emergency Medicine

## 2015-10-05 ENCOUNTER — Emergency Department (HOSPITAL_COMMUNITY): Payer: Medicare Other

## 2015-10-05 ENCOUNTER — Telehealth: Payer: Self-pay | Admitting: Neurology

## 2015-10-05 ENCOUNTER — Observation Stay (HOSPITAL_COMMUNITY): Payer: Medicare Other

## 2015-10-05 ENCOUNTER — Observation Stay (HOSPITAL_COMMUNITY)
Admission: EM | Admit: 2015-10-05 | Discharge: 2015-10-08 | Disposition: A | Discharge status: Home / Self Care | Payer: Medicare Other | Attending: Internal Medicine | Admitting: Internal Medicine

## 2015-10-05 DIAGNOSIS — Z79899 Other long term (current) drug therapy: Secondary | ICD-10-CM | POA: Insufficient documentation

## 2015-10-05 DIAGNOSIS — I119 Hypertensive heart disease without heart failure: Secondary | ICD-10-CM | POA: Diagnosis not present

## 2015-10-05 DIAGNOSIS — R51 Headache: Secondary | ICD-10-CM | POA: Diagnosis not present

## 2015-10-05 DIAGNOSIS — R42 Dizziness and giddiness: Secondary | ICD-10-CM | POA: Diagnosis not present

## 2015-10-05 DIAGNOSIS — M1990 Unspecified osteoarthritis, unspecified site: Secondary | ICD-10-CM | POA: Insufficient documentation

## 2015-10-05 DIAGNOSIS — R001 Bradycardia, unspecified: Secondary | ICD-10-CM | POA: Diagnosis present

## 2015-10-05 DIAGNOSIS — Z8249 Family history of ischemic heart disease and other diseases of the circulatory system: Secondary | ICD-10-CM | POA: Insufficient documentation

## 2015-10-05 DIAGNOSIS — R519 Headache, unspecified: Secondary | ICD-10-CM | POA: Diagnosis present

## 2015-10-05 DIAGNOSIS — F419 Anxiety disorder, unspecified: Secondary | ICD-10-CM | POA: Insufficient documentation

## 2015-10-05 DIAGNOSIS — Z880 Allergy status to penicillin: Secondary | ICD-10-CM | POA: Insufficient documentation

## 2015-10-05 DIAGNOSIS — Z885 Allergy status to narcotic agent status: Secondary | ICD-10-CM | POA: Insufficient documentation

## 2015-10-05 DIAGNOSIS — R918 Other nonspecific abnormal finding of lung field: Secondary | ICD-10-CM | POA: Insufficient documentation

## 2015-10-05 DIAGNOSIS — I350 Nonrheumatic aortic (valve) stenosis: Secondary | ICD-10-CM | POA: Diagnosis not present

## 2015-10-05 DIAGNOSIS — E7800 Pure hypercholesterolemia, unspecified: Secondary | ICD-10-CM | POA: Insufficient documentation

## 2015-10-05 DIAGNOSIS — Z791 Long term (current) use of non-steroidal anti-inflammatories (NSAID): Secondary | ICD-10-CM | POA: Diagnosis not present

## 2015-10-05 DIAGNOSIS — Z87891 Personal history of nicotine dependence: Secondary | ICD-10-CM | POA: Diagnosis not present

## 2015-10-05 DIAGNOSIS — Z7982 Long term (current) use of aspirin: Secondary | ICD-10-CM | POA: Diagnosis not present

## 2015-10-05 DIAGNOSIS — Z91048 Other nonmedicinal substance allergy status: Secondary | ICD-10-CM | POA: Insufficient documentation

## 2015-10-05 DIAGNOSIS — M4806 Spinal stenosis, lumbar region: Secondary | ICD-10-CM | POA: Diagnosis not present

## 2015-10-05 DIAGNOSIS — I10 Essential (primary) hypertension: Secondary | ICD-10-CM | POA: Diagnosis present

## 2015-10-05 DIAGNOSIS — K449 Diaphragmatic hernia without obstruction or gangrene: Secondary | ICD-10-CM | POA: Diagnosis not present

## 2015-10-05 DIAGNOSIS — F919 Conduct disorder, unspecified: Secondary | ICD-10-CM | POA: Insufficient documentation

## 2015-10-05 DIAGNOSIS — J449 Chronic obstructive pulmonary disease, unspecified: Secondary | ICD-10-CM | POA: Insufficient documentation

## 2015-10-05 DIAGNOSIS — E785 Hyperlipidemia, unspecified: Secondary | ICD-10-CM | POA: Diagnosis not present

## 2015-10-05 DIAGNOSIS — Z8049 Family history of malignant neoplasm of other genital organs: Secondary | ICD-10-CM | POA: Insufficient documentation

## 2015-10-05 DIAGNOSIS — G43909 Migraine, unspecified, not intractable, without status migrainosus: Secondary | ICD-10-CM | POA: Diagnosis not present

## 2015-10-05 DIAGNOSIS — J439 Emphysema, unspecified: Secondary | ICD-10-CM | POA: Diagnosis present

## 2015-10-05 DIAGNOSIS — I5032 Chronic diastolic (congestive) heart failure: Secondary | ICD-10-CM | POA: Diagnosis present

## 2015-10-05 DIAGNOSIS — I2510 Atherosclerotic heart disease of native coronary artery without angina pectoris: Secondary | ICD-10-CM | POA: Diagnosis present

## 2015-10-05 DIAGNOSIS — E440 Moderate protein-calorie malnutrition: Secondary | ICD-10-CM | POA: Diagnosis present

## 2015-10-05 DIAGNOSIS — R55 Syncope and collapse: Secondary | ICD-10-CM | POA: Diagnosis not present

## 2015-10-05 DIAGNOSIS — Z801 Family history of malignant neoplasm of trachea, bronchus and lung: Secondary | ICD-10-CM | POA: Insufficient documentation

## 2015-10-05 DIAGNOSIS — K219 Gastro-esophageal reflux disease without esophagitis: Secondary | ICD-10-CM | POA: Diagnosis not present

## 2015-10-05 DIAGNOSIS — R45851 Suicidal ideations: Secondary | ICD-10-CM

## 2015-10-05 DIAGNOSIS — Z87820 Personal history of traumatic brain injury: Secondary | ICD-10-CM | POA: Insufficient documentation

## 2015-10-05 DIAGNOSIS — Z808 Family history of malignant neoplasm of other organs or systems: Secondary | ICD-10-CM | POA: Insufficient documentation

## 2015-10-05 LAB — CBC WITH DIFFERENTIAL/PLATELET
BASOS ABS: 0 10*3/uL (ref 0.0–0.1)
Basophils Relative: 0 %
EOS ABS: 0.2 10*3/uL (ref 0.0–0.7)
Eosinophils Relative: 2 %
HCT: 43.5 % (ref 39.0–52.0)
HEMOGLOBIN: 14.1 g/dL (ref 13.0–17.0)
LYMPHS PCT: 38 %
Lymphs Abs: 4.5 10*3/uL — ABNORMAL HIGH (ref 0.7–4.0)
MCH: 30.4 pg (ref 26.0–34.0)
MCHC: 32.4 g/dL (ref 30.0–36.0)
MCV: 93.8 fL (ref 78.0–100.0)
MONO ABS: 0.8 10*3/uL (ref 0.1–1.0)
Monocytes Relative: 7 %
NEUTROS PCT: 53 %
Neutro Abs: 6.3 10*3/uL (ref 1.7–7.7)
PLATELETS: 186 10*3/uL (ref 150–400)
RBC: 4.64 MIL/uL (ref 4.22–5.81)
RDW: 15 % (ref 11.5–15.5)
WBC: 11.8 10*3/uL — AB (ref 4.0–10.5)

## 2015-10-05 LAB — BASIC METABOLIC PANEL
Anion gap: 10 (ref 5–15)
BUN: 18 mg/dL (ref 6–20)
CHLORIDE: 107 mmol/L (ref 101–111)
CO2: 23 mmol/L (ref 22–32)
Calcium: 9.2 mg/dL (ref 8.9–10.3)
Creatinine, Ser: 1.12 mg/dL (ref 0.61–1.24)
Glucose, Bld: 74 mg/dL (ref 65–99)
POTASSIUM: 3.7 mmol/L (ref 3.5–5.1)
SODIUM: 140 mmol/L (ref 135–145)

## 2015-10-05 LAB — URINALYSIS, ROUTINE W REFLEX MICROSCOPIC
Bilirubin Urine: NEGATIVE
GLUCOSE, UA: NEGATIVE mg/dL
HGB URINE DIPSTICK: NEGATIVE
KETONES UR: NEGATIVE mg/dL
LEUKOCYTES UA: NEGATIVE
Nitrite: NEGATIVE
PROTEIN: NEGATIVE mg/dL
Specific Gravity, Urine: 1.01 (ref 1.005–1.030)
pH: 6.5 (ref 5.0–8.0)

## 2015-10-05 LAB — BRAIN NATRIURETIC PEPTIDE: B NATRIURETIC PEPTIDE 5: 53.8 pg/mL (ref 0.0–100.0)

## 2015-10-05 LAB — TROPONIN I: Troponin I: 0.03 ng/mL (ref <0.03)

## 2015-10-05 MED ORDER — NORTRIPTYLINE HCL 10 MG PO CAPS
30.0000 mg | ORAL_CAPSULE | Freq: Every day | ORAL | Status: DC
  Filled 2015-10-05: qty 3

## 2015-10-05 MED ORDER — HALOPERIDOL LACTATE 5 MG/ML IJ SOLN: 5.0000 mg | Freq: Once | INTRAMUSCULAR | Status: DC

## 2015-10-05 MED ORDER — IRBESARTAN 75 MG PO TABS: 37.5000 mg | ORAL_TABLET | Freq: Every day | ORAL | Status: DC

## 2015-10-05 MED ORDER — CLONAZEPAM 1 MG PO TABS
2.0000 mg | ORAL_TABLET | Freq: Two times a day (BID) | ORAL | Status: DC | PRN
  Administered 2015-10-06 – 2015-10-07 (×2): 2 mg via ORAL
  Filled 2015-10-05 (×2): qty 2

## 2015-10-05 MED ORDER — LORAZEPAM 2 MG/ML IJ SOLN
INTRAMUSCULAR | Status: AC
  Administered 2015-10-05: 2 mg
  Filled 2015-10-05: qty 1

## 2015-10-05 MED ORDER — BUTALBITAL-APAP-CAFFEINE 50-325-40 MG PO TABS
1.0000 | ORAL_TABLET | ORAL | Status: DC | PRN
  Filled 2015-10-05: qty 1

## 2015-10-05 MED ORDER — SODIUM CHLORIDE 0.9 % IV SOLN: INTRAVENOUS | Status: DC

## 2015-10-05 MED ORDER — IBUPROFEN 200 MG PO TABS: 200.0000 mg | ORAL_TABLET | Freq: Four times a day (QID) | ORAL | Status: DC | PRN

## 2015-10-05 MED ORDER — TRAZODONE HCL 100 MG PO TABS
100.0000 mg | ORAL_TABLET | Freq: Every day | ORAL | Status: DC
Start: 2015-10-05 — End: 2015-10-08
  Administered 2015-10-06 – 2015-10-07 (×2): 100 mg via ORAL
  Filled 2015-10-05 (×3): qty 1

## 2015-10-05 MED ORDER — ONDANSETRON HCL 4 MG PO TABS
4.0000 mg | ORAL_TABLET | Freq: Four times a day (QID) | ORAL | Status: DC | PRN
  Administered 2015-10-06: 4 mg via ORAL
  Filled 2015-10-05: qty 1

## 2015-10-05 MED ORDER — SIMVASTATIN 40 MG PO TABS
40.0000 mg | ORAL_TABLET | Freq: Every day | ORAL | Status: DC
  Administered 2015-10-06 – 2015-10-07 (×2): 40 mg via ORAL
  Filled 2015-10-05 (×2): qty 1

## 2015-10-05 MED ORDER — ASPIRIN 81 MG PO CHEW
81.0000 mg | CHEWABLE_TABLET | Freq: Every day | ORAL | Status: DC
Start: 2015-10-06 — End: 2015-10-08
  Administered 2015-10-08: 81 mg via ORAL
  Filled 2015-10-05 (×2): qty 1

## 2015-10-05 MED ORDER — ALUM & MAG HYDROXIDE-SIMETH 200-200-20 MG/5ML PO SUSP
30.0000 mL | Freq: Four times a day (QID) | ORAL | Status: DC | PRN
  Administered 2015-10-07 – 2015-10-08 (×3): 30 mL via ORAL
  Filled 2015-10-05 (×3): qty 30

## 2015-10-05 MED ORDER — ENOXAPARIN SODIUM 40 MG/0.4ML ~~LOC~~ SOLN
40.0000 mg | SUBCUTANEOUS | Status: DC
  Filled 2015-10-05 (×2): qty 0.4

## 2015-10-05 MED ORDER — PREGABALIN 75 MG PO CAPS
75.0000 mg | ORAL_CAPSULE | Freq: Four times a day (QID) | ORAL | Status: DC
  Filled 2015-10-05: qty 1

## 2015-10-05 MED ORDER — SODIUM CHLORIDE 0.9% FLUSH: 3.0000 mL | Freq: Two times a day (BID) | INTRAVENOUS | Status: DC

## 2015-10-05 MED ORDER — ONDANSETRON HCL 4 MG/2ML IJ SOLN: 4.0000 mg | Freq: Four times a day (QID) | INTRAMUSCULAR | Status: DC | PRN

## 2015-10-05 MED ORDER — LOSARTAN POTASSIUM 50 MG PO TABS
100.0000 mg | ORAL_TABLET | Freq: Every day | ORAL | Status: DC
  Administered 2015-10-08: 100 mg via ORAL
  Filled 2015-10-05 (×2): qty 2

## 2015-10-05 MED ORDER — LOSARTAN POTASSIUM 50 MG PO TABS: 50.0000 mg | ORAL_TABLET | Freq: Every day | ORAL | Status: DC

## 2015-10-05 MED ORDER — IPRATROPIUM-ALBUTEROL 0.5-2.5 (3) MG/3ML IN SOLN
3.0000 mL | Freq: Once | RESPIRATORY_TRACT | Status: AC
  Administered 2015-10-05: 3 mL via RESPIRATORY_TRACT
  Filled 2015-10-05: qty 3

## 2015-10-05 MED ORDER — CITALOPRAM HYDROBROMIDE 40 MG PO TABS: 40.0000 mg | ORAL_TABLET | Freq: Every day | ORAL | Status: DC

## 2015-10-05 MED ORDER — PANTOPRAZOLE SODIUM 40 MG PO TBEC
40.0000 mg | DELAYED_RELEASE_TABLET | Freq: Every day | ORAL | Status: DC
  Administered 2015-10-08: 40 mg via ORAL
  Filled 2015-10-05 (×2): qty 1

## 2015-10-05 MED ORDER — HALOPERIDOL LACTATE 5 MG/ML IJ SOLN
INTRAMUSCULAR | Status: AC
  Administered 2015-10-05: 16:00:00
  Filled 2015-10-05: qty 1

## 2015-10-05 MED ORDER — IPRATROPIUM BROMIDE 0.02 % IN SOLN: 0.5000 mg | Freq: Once | RESPIRATORY_TRACT | Status: DC

## 2015-10-05 MED ORDER — BUDESONIDE 0.5 MG/2ML IN SUSP
0.5000 mg | Freq: Two times a day (BID) | RESPIRATORY_TRACT | Status: DC
  Filled 2015-10-05 (×3): qty 2

## 2015-10-05 MED ORDER — MECLIZINE HCL 25 MG PO TABS: 25.0000 mg | ORAL_TABLET | Freq: Three times a day (TID) | ORAL | Status: DC | PRN

## 2015-10-05 MED ORDER — ALBUTEROL (5 MG/ML) CONTINUOUS INHALATION SOLN: 10.0000 mg/h | INHALATION_SOLUTION | Freq: Once | RESPIRATORY_TRACT | Status: DC

## 2015-10-05 NOTE — ED Notes (Signed)
Placed pt in maroon scrubs. Will have pt wanded. Pt's belongings placed at nurses station. ED staff sitting with pt.

## 2015-10-05 NOTE — ED Notes (Addendum)
Patient allowed me to restart EKG and Bp.  Patient allowed officer to be in room to collect urine sample.  Patient is now resting.
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
## 2015-10-05 NOTE — ED Notes (Addendum)
Patient became agitated again and becoming verbally aggressive again. Security at bedside. Agricultural consultant and EDP aware.

## 2015-10-05 NOTE — Telephone Encounter (Signed)
He was given the appropriate information to follow up with is PCP.
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
## 2015-10-05 NOTE — ED Notes (Signed)
Meal given
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
## 2015-10-05 NOTE — Telephone Encounter (Signed)
Called Dr. Siri Cole office - patient was seen today in her office.  He was transported to the hospital via ambulance.  Dr. [name] aware.

## 2015-10-05 NOTE — ED Notes (Addendum)
Patient is now resting complained of head pain from when he hit the concrete from his fall.

## 2015-10-05 NOTE — Progress Notes (Signed)
Received report from [place] in the ED.  Patient is suicidal and will need a sitter.  Informed charge nurse of this information.  Informed NT we needed to suicide proof his room. Will await his arrival.

## 2015-10-05 NOTE — ED Notes (Addendum)
Patient Brother Rick Church gave me his contact number. Number left in paper towel on bedside table. Patient remains clam while talking to officer.  Patient still refuses to urinate with someone in the room. Unable to get vitals because patient ripped off leads and BP cuff. Patient said he will allow me to attempt later if he is in a good mood.

## 2015-10-05 NOTE — ED Notes (Signed)
Rick Church, [name], [phone], nephew

## 2015-10-05 NOTE — Progress Notes (Signed)
Patient uncooperative with all services.  Threatening to leave against medical advice. I told him he could not do that, and he proceeded to walk out of his room, to the exit door.  Security was called.  By the time they arrived, he was in the elevator. He was finally coaxed back to his room.  He wanted to read the paper which showed he was IVC, and we complied.  He read it and said he would sit here all night long.  He was vulgar, using profanities, and very unhappy. Sitter is in the room.  At this point, he is refusing to go to bed, to take any medicine, to wear telemetry, have his labs drawn. The doctor did give orders for restraints, but we have not had to use restrictions. Continue to monitor.
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
## 2015-10-05 NOTE — Progress Notes (Signed)
Sent Page to [place], on call.  Patient is refusing bed alarm, refusing to let me put telemetry box on him, and is stating he wants to leave right now. Will continue to monitor and wait for a call back.

## 2015-10-05 NOTE — ED Notes (Signed)
Md at bedside

## 2015-10-05 NOTE — Telephone Encounter (Signed)
Pt called in complaining of being dizzy. After reviewing tele notes I was not sure if appt was needed or to suggest he go see pcp. When I mentioned that he go see pcp the call was dropped.

## 2015-10-06 ENCOUNTER — Encounter (HOSPITAL_COMMUNITY): Payer: Medicare Other

## 2015-10-06 ENCOUNTER — Other Ambulatory Visit (HOSPITAL_COMMUNITY): Payer: Medicare Other

## 2015-10-06 ENCOUNTER — Observation Stay (HOSPITAL_COMMUNITY): Payer: Medicare Other

## 2015-10-06 DIAGNOSIS — I5032 Chronic diastolic (congestive) heart failure: Secondary | ICD-10-CM | POA: Diagnosis not present

## 2015-10-06 DIAGNOSIS — F0781 Postconcussional syndrome: Secondary | ICD-10-CM

## 2015-10-06 DIAGNOSIS — R51 Headache: Secondary | ICD-10-CM

## 2015-10-06 DIAGNOSIS — J439 Emphysema, unspecified: Secondary | ICD-10-CM | POA: Diagnosis not present

## 2015-10-06 DIAGNOSIS — I10 Essential (primary) hypertension: Secondary | ICD-10-CM

## 2015-10-06 DIAGNOSIS — R45851 Suicidal ideations: Secondary | ICD-10-CM | POA: Diagnosis not present

## 2015-10-06 DIAGNOSIS — R55 Syncope and collapse: Secondary | ICD-10-CM | POA: Diagnosis not present

## 2015-10-06 DIAGNOSIS — R001 Bradycardia, unspecified: Secondary | ICD-10-CM | POA: Diagnosis not present

## 2015-10-06 LAB — CBC
HCT: 44.1 % (ref 39.0–52.0)
HEMOGLOBIN: 14 g/dL (ref 13.0–17.0)
MCH: 29.8 pg (ref 26.0–34.0)
MCHC: 31.7 g/dL (ref 30.0–36.0)
MCV: 93.8 fL (ref 78.0–100.0)
Platelets: 179 10*3/uL (ref 150–400)
RBC: 4.7 MIL/uL (ref 4.22–5.81)
RDW: 15 % (ref 11.5–15.5)
WBC: 9.4 10*3/uL (ref 4.0–10.5)

## 2015-10-06 LAB — BASIC METABOLIC PANEL
ANION GAP: 8 (ref 5–15)
BUN: 14 mg/dL (ref 6–20)
CALCIUM: 9.3 mg/dL (ref 8.9–10.3)
CO2: 29 mmol/L (ref 22–32)
Chloride: 106 mmol/L (ref 101–111)
Creatinine, Ser: 1.02 mg/dL (ref 0.61–1.24)
GLUCOSE: 128 mg/dL — AB (ref 65–99)
Potassium: 4.3 mmol/L (ref 3.5–5.1)
SODIUM: 143 mmol/L (ref 135–145)

## 2015-10-06 LAB — TROPONIN I: TROPONIN I: 0.03 ng/mL — AB (ref <0.03)

## 2015-10-06 LAB — TSH: TSH: 2.581 u[IU]/mL (ref 0.350–4.500)

## 2015-10-06 MED ORDER — LORAZEPAM 2 MG/ML IJ SOLN: 2.0000 mg | Freq: Once | INTRAMUSCULAR | Status: AC

## 2015-10-06 MED ORDER — CITALOPRAM HYDROBROMIDE 20 MG PO TABS
20.0000 mg | ORAL_TABLET | Freq: Every day | ORAL | Status: DC
  Filled 2015-10-06 (×2): qty 1

## 2015-10-06 MED ORDER — PREGABALIN 75 MG PO CAPS
75.0000 mg | ORAL_CAPSULE | Freq: Three times a day (TID) | ORAL | Status: DC
  Administered 2015-10-06 – 2015-10-08 (×3): 75 mg via ORAL
  Filled 2015-10-06 (×4): qty 1

## 2015-10-06 MED ORDER — LORAZEPAM 1 MG PO TABS
2.0000 mg | ORAL_TABLET | Freq: Once | ORAL | Status: AC
  Administered 2015-10-06: 2 mg via ORAL
  Filled 2015-10-06: qty 2

## 2015-10-06 MED ORDER — CARBAMAZEPINE 200 MG PO TABS
200.0000 mg | ORAL_TABLET | Freq: Two times a day (BID) | ORAL | Status: DC
  Administered 2015-10-06 – 2015-10-07 (×2): 200 mg via ORAL
  Filled 2015-10-06 (×4): qty 1

## 2015-10-06 NOTE — Progress Notes (Signed)
Patient in a better mood this am. Ate some graham cracker and peanut butter. Also let me take his vital signs. Currently with a sitter.  Safety maintained.

## 2015-10-06 NOTE — Progress Notes (Signed)
Tech attempted to take CBG, Pt takes the strip out of the CBG machine and destroys it before discarding strip into trash. Tech unable to get CBG.
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
## 2015-10-06 NOTE — Progress Notes (Signed)
Aide tried to obtain blood sugar this am, patient allowed her to stick his finger to obtain blood sample but then took his hand away and would not let her obtain sample. Blood sugar was not done.  Cormick Moss, Tivis Ringer, RN

## 2015-10-06 NOTE — Care Management Obs Status (Signed)
[facility] NOTIFICATION   Patient Details  Name: KEITHEN WOOLSON MRN: [id number] Date of Birth: 1940/05/20   Medicare Observation Status Notification Given:  No  Patient IVC'd.  Carles Collet, RN 10/06/2015, 10:41 AM
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
## 2015-10-06 NOTE — Progress Notes (Signed)
Patient wants to know where his meds are.  He is especially missing the klonopin. I called the pharmacy to find out if it was too late or not to give his meds. Pharmacy said go ahead and give him the meds, however klonopin was not ordered.  I told him I would page the MD to see about the klonopin, and he said, "forget it. I don't want any of them.  Give them to your dogs" Returned what I got out. Patient is angry, and wants to leave early in the am.

## 2015-10-06 NOTE — Progress Notes (Signed)
Security called to room, patient angry cussing yelling in the room and at nurses station. Patient threw food in the floor and sink states he is going to leave. MD notified awaiting a call back.  Glender Augusta, Tivis Ringer, RN

## 2015-10-06 NOTE — Progress Notes (Signed)
PT Cancellation Note  Patient Details Name: Rick Church MRN: [id number] DOB: 10-Feb-1940   Cancelled Treatment:    Reason Eval/Treat Not Completed: Patient declined, no reason specified.  Pt is refusing all interventions today. Pt has no trouble getting out of his room and coming up to the desk.  Will defer the evaluation today and reassess need in 9./27. 10/06/2015  Donnella Sham, PT [phone]  (pager)   Amarionna Arca, Tessie Fass 10/06/2015, 3:16 PM

## 2015-10-06 NOTE — Progress Notes (Signed)
Per medical director and CSW staff point of view, patient is not likely to get an inpatient psych bed due to refusal of medical testing and behaviors. CSW recommends that psych MD be consulted again to see if patient can be cleared to go home.   Rick Church LCSWA [phone]

## 2015-10-06 NOTE — Progress Notes (Signed)
Food and drink offered to Pt. Pt refused and asked for breakfast tray to be taken out of room.

## 2015-10-06 NOTE — Progress Notes (Signed)
Was the fall witnessed: yes  Patient condition before and after the fall: hostile and angry  Patient's reaction to the fall: patient states he is fine no pain or injuries noted  Name of the doctor that was notified including date and time: Dr.Hongali  Any interventions and vital signs: no and patient refused vitals   Dr Lindaann Pascal notified of fall no injuries noted, no new orders

## 2015-10-06 NOTE — Progress Notes (Signed)
Attempted carotid artery duplex, however patient refused. Please reorder if/when patient is willing to have study done.  10/06/2015 12:07 PM Maudry Mayhew, BS, RVT, RDCS, RDMS

## 2015-10-06 NOTE — Progress Notes (Signed)
Patient has refused all nursing care today, no assessment or vitals completed patient stated he would not take any medications from here.Patint came out into hallway yelling while he was taking his IV out, I finished removing his IV he had half way out. MD is aware of how patient is acting. Patient has also refused labs and echo to be done.   Blain Hunsucker, Tivis Ringer, RN
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
## 2015-10-06 NOTE — Care Management Note (Signed)
Case Management Note  Patient Details  Name: EMANUAL BIENAIME MRN: [id number] Date of Birth: 18-Oct-1940  Subjective/Objective:                 Independent patient in obs who lives at home sent from PCP Dr Fulp's office for syncope. Patient demonstrated aggressive hostility in ED. Patient is IVC'd. Has sitter.   Action/Plan:  Dispo plan pending psych consult. Per Dr Algis Liming psych has been notified of need for consult.  Expected Discharge Date:                  Expected Discharge Plan:   ([facility])  In-House Referral:  Clinical Social Work  Discharge planning Services  CM Consult  Post Acute Care Choice:  NA Choice offered to:  NA  DME Arranged:  N/A DME Agency:  NA  HH Arranged:  NA HH Agency:  NA  Status of Service:  In process, will continue to follow  If discussed at Long Length of Stay Meetings, dates discussed:    Additional Comments:  Carles Collet, RN 10/06/2015, 10:43 AM
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
## 2015-10-07 DIAGNOSIS — I5032 Chronic diastolic (congestive) heart failure: Secondary | ICD-10-CM | POA: Diagnosis not present

## 2015-10-07 DIAGNOSIS — J438 Other emphysema: Secondary | ICD-10-CM

## 2015-10-07 DIAGNOSIS — R55 Syncope and collapse: Secondary | ICD-10-CM | POA: Diagnosis not present

## 2015-10-07 DIAGNOSIS — R001 Bradycardia, unspecified: Secondary | ICD-10-CM | POA: Diagnosis not present

## 2015-10-07 DIAGNOSIS — I2510 Atherosclerotic heart disease of native coronary artery without angina pectoris: Secondary | ICD-10-CM | POA: Diagnosis not present

## 2015-10-07 LAB — HEMOGLOBIN A1C
HEMOGLOBIN A1C: 5.5 % (ref 4.8–5.6)
MEAN PLASMA GLUCOSE: 111 mg/dL

## 2015-10-07 MED ORDER — IBUPROFEN 400 MG PO TABS: 400.0000 mg | ORAL_TABLET | Freq: Four times a day (QID) | ORAL | Status: DC | PRN

## 2015-10-07 MED ORDER — CITALOPRAM HYDROBROMIDE 20 MG PO TABS: 20.0000 mg | ORAL_TABLET | Freq: Every day | ORAL | 0 refills | Status: DC

## 2015-10-07 MED ORDER — CARBAMAZEPINE 200 MG PO TABS: 200.0000 mg | ORAL_TABLET | Freq: Two times a day (BID) | ORAL | 0 refills | Status: DC

## 2015-10-07 MED ORDER — PREGABALIN 75 MG PO CAPS: 75.0000 mg | ORAL_CAPSULE | Freq: Three times a day (TID) | ORAL | 0 refills | Status: DC

## 2015-10-07 MED ORDER — LOSARTAN POTASSIUM 100 MG PO TABS: 100.0000 mg | ORAL_TABLET | Freq: Every day | ORAL | 0 refills | Status: DC

## 2015-10-07 NOTE — Progress Notes (Signed)
PT Cancellation Note  Patient Details Name: Rick Church MRN: [id number] DOB: 10-26-1940   Cancelled Treatment:    Reason Eval/Treat Not Completed: Patient declined, no reason specified, did not want to work wit PT unless he could walk out of this hospital.    [place], [place] 10/07/2015, 1:46 PM
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
## 2015-10-07 NOTE — Progress Notes (Addendum)
Patient medically stable for discharge. Discharge order written. Awaiting bed placement at a psychiatric hospital at this time. Patient remains IVC'd at this time. Last psych note within 24 hours.  Addendum 10/08/15 Patient cleared by psych, Dr Tat stated he will rescind IVC and patient can DC to home, self care, today.

## 2015-10-07 NOTE — Progress Notes (Signed)
Patient just requested something for a migraine. When I offered Fioracet, he declined, stating that it worked when it was just a regular migraine.  He states this is a migraine that hurts in his eyeball and he takes cambia for it.  I phoned pharmacy.  They do have it in pill form, so I Paged Baltazar Najjar, NP requesting it.  Waiting to hear back.

## 2015-10-08 DIAGNOSIS — R001 Bradycardia, unspecified: Secondary | ICD-10-CM | POA: Diagnosis not present

## 2015-10-08 DIAGNOSIS — F0781 Postconcussional syndrome: Secondary | ICD-10-CM | POA: Diagnosis not present

## 2015-10-08 DIAGNOSIS — R45851 Suicidal ideations: Secondary | ICD-10-CM | POA: Diagnosis not present

## 2015-10-08 DIAGNOSIS — J438 Other emphysema: Secondary | ICD-10-CM | POA: Diagnosis not present

## 2015-10-08 DIAGNOSIS — I2510 Atherosclerotic heart disease of native coronary artery without angina pectoris: Secondary | ICD-10-CM | POA: Diagnosis not present

## 2015-10-08 DIAGNOSIS — R55 Syncope and collapse: Secondary | ICD-10-CM | POA: Diagnosis not present

## 2015-10-08 MED ORDER — LOSARTAN POTASSIUM 100 MG PO TABS: 100.0000 mg | ORAL_TABLET | Freq: Every day | ORAL | 1 refills | Status: DC

## 2015-10-08 MED ORDER — CARBAMAZEPINE 200 MG PO TABS: 200.0000 mg | ORAL_TABLET | Freq: Two times a day (BID) | ORAL | 1 refills | Status: DC

## 2015-10-08 MED ORDER — PREGABALIN 75 MG PO CAPS: 75.0000 mg | ORAL_CAPSULE | Freq: Three times a day (TID) | ORAL | 0 refills | Status: DC

## 2015-10-08 MED ORDER — CITALOPRAM HYDROBROMIDE 20 MG PO TABS: 20.0000 mg | ORAL_TABLET | Freq: Every day | ORAL | 1 refills | Status: DC

## 2015-10-08 NOTE — Progress Notes (Signed)
CSW faxed notice of commitment change to magistrate to rescind IVC.  CSW signing off.  Rick Church LCSWA [phone]
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
## 2015-10-08 NOTE — Progress Notes (Signed)
PT Cancellation Note  Patient Details Name: Rick Church MRN: [id number] DOB: 1940/05/10   Cancelled Treatment:    Reason Eval/Treat Not Completed: PT screened, no needs identified, will sign off.  Dr. Carles Collet agrees cancel the evaluation on [facility] due to pt refusal and discharge imminent. 10/08/2015  Donnella Sham, [place] [facility] [phone]  (pager)   Dyshawn Cangelosi, Tessie Fass 10/08/2015, 3:02 PM

## 2015-10-08 NOTE — Progress Notes (Signed)
Reviewed d/c with patient and family. No iv in patient arm. Escorted out via wheelchair.   Gilles Trimpe, Mervin Kung RN

## 2015-10-12 ENCOUNTER — Encounter: Payer: Self-pay | Admitting: *Deleted

## 2015-10-12 ENCOUNTER — Ambulatory Visit: Payer: Medicare Other | Admitting: Adult Health

## 2015-10-12 ENCOUNTER — Telehealth: Payer: Self-pay | Admitting: Neurology

## 2015-10-12 NOTE — Telephone Encounter (Signed)
See other note

## 2015-10-12 NOTE — Telephone Encounter (Signed)
Please call his brother after 10:40, I have asked his brother to try to get in touch with him.

## 2015-10-12 NOTE — Telephone Encounter (Signed)
I have talked with his brother, relayed that at current stage, he should focus on the treatment to stabilize his mood,, his headache has been a chronic issues, we had extensive evaluation in the past, including CAT scan, showed no significant abnormality,  He is already on polypharmacy treatment, further medication treatment likely will have have side effect of moderating affecting his mood,  I do not think follow-up visit with my office is needed at this point, his brother voiced understanding,  I tried to call patient, failed to reach him, left a message.

## 2015-10-12 NOTE — Telephone Encounter (Signed)
Dr. [name] has mailed patient a letter of dismissal.

## 2015-10-12 NOTE — Telephone Encounter (Addendum)
I have spoken with patient's brother, Wille Glaser.  He has reached out to the patient and he is aware his appt has been canceled.  He needs to follow up with his PCP and psychiatry.  Per Dr. [name], he is being discharged from our practice.  He needs to have his mood disorder managed.

## 2015-10-12 NOTE — Telephone Encounter (Signed)
Attempted to reach again - left message requesting a return call.

## 2015-10-12 NOTE — Telephone Encounter (Signed)
Please call his brother after 10:40, I have asked his brother to try to get in touch with him

## 2015-10-27 ENCOUNTER — Encounter: Payer: Medicare Other | Admitting: Physical Therapy

## 2015-10-29 ENCOUNTER — Encounter: Payer: Medicare Other | Admitting: Physical Therapy

## 2015-11-02 ENCOUNTER — Ambulatory Visit: Payer: Medicare Other | Admitting: Adult Health

## 2016-01-05 ENCOUNTER — Other Ambulatory Visit: Payer: Self-pay | Admitting: Family Medicine

## 2016-01-05 DIAGNOSIS — R110 Nausea: Secondary | ICD-10-CM

## 2016-01-06 ENCOUNTER — Ambulatory Visit
Admission: RE | Admit: 2016-01-06 | Discharge: 2016-01-06 | Disposition: A | Discharge status: Home / Self Care | Payer: Medicare Other | Source: Ambulatory Visit | Attending: Family Medicine | Admitting: Family Medicine

## 2016-01-06 DIAGNOSIS — R110 Nausea: Secondary | ICD-10-CM

## 2016-02-05 ENCOUNTER — Telehealth: Payer: Self-pay | Admitting: Cardiology
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
## 2016-02-09 ENCOUNTER — Ambulatory Visit (INDEPENDENT_AMBULATORY_CARE_PROVIDER_SITE_OTHER): Payer: Medicare Other | Admitting: Nurse Practitioner

## 2016-02-09 ENCOUNTER — Encounter: Payer: Self-pay | Admitting: Nurse Practitioner

## 2016-02-09 ENCOUNTER — Telehealth (HOSPITAL_COMMUNITY): Payer: Self-pay | Admitting: *Deleted

## 2016-02-09 VITALS — BP 170/100 | HR 52 | Ht 68.0 in | Wt 174.4 lb

## 2016-02-09 DIAGNOSIS — I2510 Atherosclerotic heart disease of native coronary artery without angina pectoris: Secondary | ICD-10-CM

## 2016-02-09 DIAGNOSIS — I350 Nonrheumatic aortic (valve) stenosis: Secondary | ICD-10-CM | POA: Diagnosis not present

## 2016-02-09 DIAGNOSIS — I5032 Chronic diastolic (congestive) heart failure: Secondary | ICD-10-CM

## 2016-02-09 DIAGNOSIS — R55 Syncope and collapse: Secondary | ICD-10-CM

## 2016-02-09 DIAGNOSIS — Z01818 Encounter for other preprocedural examination: Secondary | ICD-10-CM

## 2016-02-09 NOTE — Telephone Encounter (Signed)
Patient given detailed instructions per Myocardial Perfusion Study Information Sheet for the test on 02/10/16 at 7:45. Patient notified to arrive 15 minutes early and that it is imperative to arrive on time for appointment to keep from having the test rescheduled.  If you need to cancel or reschedule your appointment, please call the office within 24 hours of your appointment. Failure to do so may result in a cancellation of your appointment, and a $50 no show fee. Patient verbalized understanding.Rick Church

## 2016-02-09 NOTE — Patient Instructions (Addendum)
We will be checking the following labs today - NONE   Medication Instructions:    Continue with your current medicines.     Testing/Procedures To Be Arranged:  Echocardiogram  Lexiscan Myoview  Follow-Up:   See Dr. Meda Coffee in 4 months tentatively - will see how your studies turn out.     Other Special Instructions:   N/A    If you need a refill on your cardiac medications before your next appointment, please call your pharmacy.   Call the [place] office at [phone] if you have any questions, problems or concerns.

## 2016-02-10 ENCOUNTER — Encounter (HOSPITAL_COMMUNITY): Payer: Self-pay

## 2016-02-10 ENCOUNTER — Telehealth: Payer: Self-pay | Admitting: Nurse Practitioner

## 2016-02-10 ENCOUNTER — Ambulatory Visit (HOSPITAL_COMMUNITY): Payer: Medicare Other

## 2016-02-10 ENCOUNTER — Encounter: Payer: Self-pay | Admitting: Nurse Practitioner

## 2016-02-10 ENCOUNTER — Encounter (HOSPITAL_COMMUNITY): Payer: Self-pay | Admitting: *Deleted

## 2016-02-10 NOTE — Progress Notes (Signed)
Rick Church presented to [facility] [place], Nuclear Medicine Department for MPI Study. Upon technologist bringing patient back into the department, he presented as angry, uncooperative and was verbally abusive toward staff using foul language. He was asked not to use the foul language, the technologist continued to try and calm the patient with no success. Technologist contacted supervisor who then met with patient along with the technologist. He continued to exhibit the above behaviors. It was explained to Rick Church that we would not be able to perform his test today. Remer Macho, NP notified, along with office manager Lillia Pauls.

## 2016-02-10 NOTE — Telephone Encounter (Signed)
I attempt to call the patient to inform him that we must cancel his echocardiogram that is scheduled at our office tomorrow, 02/11/16.   The patient did not answer.   I left a voicemail stating that this echo appointment was being canceled due to nature of his visit to our office this morning. I informed the patient that his surgeon's office has been notified that we are canceling his appointment. I suggested that the patient contact his surgeon's office for next steps.

## 2016-02-11 ENCOUNTER — Other Ambulatory Visit (HOSPITAL_COMMUNITY): Payer: Self-pay

## 2016-02-11 NOTE — Telephone Encounter (Signed)
[name] returned my call around 4pm yesterday. [name] was emotionally apologetic and regretful for his behavior towards our staff. He asked that I pass his apology along to the team that he interacted with.   I thanked [name] for his apology and confirmed that I would it with those that were involved in his. I went on to confirm that we can no treat him as a patient and [name] was accepting and understanding. He stated that he had spoken with his surgeon's office already and they are going to help him find another cardiologist for his pre-op clearance.  I'm sending the original signed dismissal letter to Rick Church with our HIM team for next with officially dismissing the patient from all CVD practices.

## 2016-02-16 ENCOUNTER — Telehealth: Payer: Self-pay | Admitting: Nurse Practitioner

## 2016-02-16 NOTE — Telephone Encounter (Signed)
Patient dismissed from [facility] by Truitt Merle NP , effective February 10, 2016. Dismissal letter sent out by certified / registered mail.  DAJ

## 2016-03-02 NOTE — Telephone Encounter (Signed)
Received signed domestic return receipt verifying delivery of certified letter on February 23, 2016. Article number [id number] [place]

## 2016-03-02 NOTE — Telephone Encounter (Signed)
Noted! Thank you

## 2016-03-31 ENCOUNTER — Ambulatory Visit: Payer: Self-pay | Admitting: General Surgery

## 2016-04-20 ENCOUNTER — Ambulatory Visit: Payer: Self-pay | Admitting: General Surgery

## 2016-05-11 ENCOUNTER — Encounter: Payer: Self-pay | Admitting: Neurology

## 2016-05-17 ENCOUNTER — Encounter (HOSPITAL_COMMUNITY): Payer: Self-pay

## 2016-05-17 ENCOUNTER — Encounter (HOSPITAL_COMMUNITY): Payer: Self-pay | Admitting: Vascular Surgery

## 2016-05-17 ENCOUNTER — Inpatient Hospital Stay (HOSPITAL_COMMUNITY)
Admission: RE | Admit: 2016-05-17 | Discharge: 2016-05-17 | Disposition: A | Discharge status: Home / Self Care | Payer: Medicare Other | Source: Ambulatory Visit

## 2016-05-17 HISTORY — DX: Non-ST elevation (NSTEMI) myocardial infarction: I21.4

## 2016-05-17 HISTORY — DX: Nonrheumatic aortic (valve) stenosis: I35.0

## 2016-05-17 NOTE — Progress Notes (Signed)
Pt. Was no show for PAT appointment. Called and left message for pt at 11:15am.Pt. Later returned call approx. 1:50pm. Apologized for not coming ,stated he thought PAT appointment was 05/18/16. Reported he has been sick all week long, not eating much and hurting everywhere: chest,abdomen, back and elbows. Stated it was more in his abdomen than chest. Discussed with pt. To call his primary medical doctor. Pt. States it Eloise Levels,  at [place] or call 911 . He stated he would call her and follow her instructions.
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
## 2016-05-17 NOTE — Pre-Procedure Instructions (Signed)
Rick Church  05/17/2016      [facility] [place], [place] - [address] [place] [address] Phone: [phone] Fax: [phone]  [facility] [place], [place] - [address] AT [facility] OF [address] [employer] [place] [address] Phone: [phone] Fax: [phone]    Your procedure is scheduled on Wed. May 16  Report to [facility] Admitting at 10:45 A.M.  Call this number if you have problems the morning of surgery:  [phone]   Remember:  Do not eat food or drink liquids after midnight on Tues. May 15   Take these medicines the morning of surgery with A SIP OF WATER : amlodipine, fioricet if needed,vcitalopram(celexa), clonazepam(klonopin) meclizine(antivert) if needed, pantoprazole(protonix), pregabalin (lyrica)             1 week prior to surgery stop aspirin, advil, motrin aleve, ibuprofen, BC Powders, Goody's, vitamins/herbal medicines and cambia   Do not wear jewelry.  Do not wear lotions, powders, or perfumes, or deoderant.  Do not shave 48 hours prior to surgery.  Men may shave face and neck.  Do not bring valuables to the hospital.  [facility] is not responsible for any belongings or valuables.  Contacts, dentures or bridgework may not be worn into surgery.  Leave your suitcase in the car.  After surgery it may be brought to your room.  For patients admitted to the hospital, discharge time will be determined by your treatment team.  Patients discharged the day of surgery will not be allowed to drive home.    Special instructions:   [place]- Preparing For Surgery  Before surgery, you can play an important role. Because skin is not sterile, your skin needs to be as free of germs as possible. You can reduce the number of germs on your skin by washing with CHG (chlorahexidine gluconate) Soap before surgery.  CHG is an antiseptic  cleaner which kills germs and bonds with the skin to continue killing germs even after washing.  Please do not use if you have an allergy to CHG or antibacterial soaps. If your skin becomes reddened/irritated stop using the CHG.  Do not shave (including legs and underarms) for at least 48 hours prior to first CHG shower. It is OK to shave your face.  Please follow these instructions carefully.   1. Shower the NIGHT BEFORE SURGERY and the MORNING OF SURGERY with CHG.   2. If you chose to wash your hair, wash your hair first as usual with your normal shampoo.  3. After you shampoo, rinse your hair and body thoroughly to remove the shampoo.  4. Use CHG as you would any other liquid soap. You can apply CHG directly to the skin and wash gently with a scrungie or a clean washcloth.   5. Apply the CHG Soap to your body ONLY FROM THE NECK DOWN.  Do not use on open wounds or open sores. Avoid contact with your eyes, ears, mouth and genitals (private parts). Wash genitals (private parts) with your normal soap.  6. Wash thoroughly, paying special attention to the area where your surgery will be performed.  7. Thoroughly rinse your body with warm water from the neck down.  8. DO NOT shower/wash with your normal soap after using and rinsing off the CHG Soap.  9. Pat yourself dry with a  CLEAN TOWEL.   10. Wear CLEAN PAJAMAS   11. Place CLEAN SHEETS on your bed the night of your first shower and DO NOT SLEEP WITH PETS.    Day of Surgery: Do not apply any deodorants/lotions. Please wear clean clothes to the hospital/surgery center.      Please read over the following fact sheets that you were given. Coughing and Deep Breathing and Surgical Site Infection Prevention

## 2016-05-25 ENCOUNTER — Encounter (HOSPITAL_COMMUNITY): Admission: RE | Payer: Self-pay | Source: Ambulatory Visit

## 2016-05-25 ENCOUNTER — Ambulatory Visit (HOSPITAL_COMMUNITY): Admission: RE | Admit: 2016-05-25 | Payer: Medicare Other | Source: Ambulatory Visit | Admitting: General Surgery

## 2016-05-25 SURGERY — LAPAROSCOPIC CHOLECYSTECTOMY WITH INTRAOPERATIVE CHOLANGIOGRAM
Anesthesia: General

## 2016-06-06 DIAGNOSIS — R9439 Abnormal result of other cardiovascular function study: Secondary | ICD-10-CM | POA: Diagnosis present
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
## 2016-06-07 ENCOUNTER — Encounter (HOSPITAL_COMMUNITY)
Admission: RE | Disposition: A | Discharge status: Home / Self Care | Payer: Self-pay | Source: Ambulatory Visit | Attending: Cardiology

## 2016-06-07 ENCOUNTER — Encounter (HOSPITAL_COMMUNITY): Payer: Self-pay | Admitting: Cardiology

## 2016-06-07 ENCOUNTER — Ambulatory Visit (HOSPITAL_COMMUNITY)
Admission: RE | Admit: 2016-06-07 | Discharge: 2016-06-07 | Disposition: A | Discharge status: Home / Self Care | Payer: Medicare Other | Source: Ambulatory Visit | Attending: Cardiology | Admitting: Cardiology

## 2016-06-07 DIAGNOSIS — I4510 Unspecified right bundle-branch block: Secondary | ICD-10-CM | POA: Diagnosis not present

## 2016-06-07 DIAGNOSIS — I110 Hypertensive heart disease with heart failure: Secondary | ICD-10-CM | POA: Diagnosis not present

## 2016-06-07 DIAGNOSIS — J449 Chronic obstructive pulmonary disease, unspecified: Secondary | ICD-10-CM | POA: Insufficient documentation

## 2016-06-07 DIAGNOSIS — Z880 Allergy status to penicillin: Secondary | ICD-10-CM | POA: Diagnosis not present

## 2016-06-07 DIAGNOSIS — E785 Hyperlipidemia, unspecified: Secondary | ICD-10-CM | POA: Insufficient documentation

## 2016-06-07 DIAGNOSIS — I714 Abdominal aortic aneurysm, without rupture: Secondary | ICD-10-CM | POA: Insufficient documentation

## 2016-06-07 DIAGNOSIS — Z7982 Long term (current) use of aspirin: Secondary | ICD-10-CM | POA: Diagnosis not present

## 2016-06-07 DIAGNOSIS — I5032 Chronic diastolic (congestive) heart failure: Secondary | ICD-10-CM | POA: Diagnosis not present

## 2016-06-07 DIAGNOSIS — I444 Left anterior fascicular block: Secondary | ICD-10-CM | POA: Diagnosis not present

## 2016-06-07 DIAGNOSIS — I2582 Chronic total occlusion of coronary artery: Secondary | ICD-10-CM | POA: Diagnosis not present

## 2016-06-07 DIAGNOSIS — K811 Chronic cholecystitis: Secondary | ICD-10-CM | POA: Diagnosis not present

## 2016-06-07 DIAGNOSIS — K219 Gastro-esophageal reflux disease without esophagitis: Secondary | ICD-10-CM | POA: Diagnosis not present

## 2016-06-07 DIAGNOSIS — F17210 Nicotine dependence, cigarettes, uncomplicated: Secondary | ICD-10-CM | POA: Insufficient documentation

## 2016-06-07 DIAGNOSIS — R9439 Abnormal result of other cardiovascular function study: Secondary | ICD-10-CM | POA: Diagnosis present

## 2016-06-07 DIAGNOSIS — I2510 Atherosclerotic heart disease of native coronary artery without angina pectoris: Secondary | ICD-10-CM | POA: Insufficient documentation

## 2016-06-07 HISTORY — PX: LEFT HEART CATH AND CORONARY ANGIOGRAPHY: CATH118249

## 2016-06-07 SURGERY — LEFT HEART CATH AND CORONARY ANGIOGRAPHY
Anesthesia: LOCAL

## 2016-06-07 MED ORDER — SODIUM CHLORIDE 0.9 % WEIGHT BASED INFUSION
3.0000 mL/kg/h | INTRAVENOUS | Status: DC
  Administered 2016-06-07: 3 mL/kg/h via INTRAVENOUS

## 2016-06-07 MED ORDER — HEPARIN (PORCINE) IN NACL 2-0.9 UNIT/ML-% IJ SOLN
INTRAMUSCULAR | Status: AC | PRN
  Administered 2016-06-07: 1000 mL

## 2016-06-07 MED ORDER — IOPAMIDOL (ISOVUE-370) INJECTION 76%
INTRAVENOUS | Status: AC
  Filled 2016-06-07: qty 100

## 2016-06-07 MED ORDER — SODIUM CHLORIDE 0.9 % WEIGHT BASED INFUSION: 1.0000 mL/kg/h | INTRAVENOUS | Status: DC

## 2016-06-07 MED ORDER — CLONAZEPAM 2 MG PO TABS: 2.0000 mg | ORAL_TABLET | Freq: Two times a day (BID) | ORAL | 0 refills | Status: DC | PRN

## 2016-06-07 MED ORDER — VERAPAMIL HCL 2.5 MG/ML IV SOLN
INTRA_ARTERIAL | Status: DC | PRN
  Administered 2016-06-07: 15 mL via INTRA_ARTERIAL

## 2016-06-07 MED ORDER — SODIUM CHLORIDE 0.9 % IV SOLN
250.0000 mL | INTRAVENOUS | Status: DC | PRN
  Administered 2016-06-07: 250 mL/h via INTRAVENOUS

## 2016-06-07 MED ORDER — ASPIRIN 81 MG PO CHEW
81.0000 mg | CHEWABLE_TABLET | ORAL | Status: AC
  Administered 2016-06-07: 81 mg via ORAL

## 2016-06-07 MED ORDER — LIDOCAINE HCL (PF) 1 % IJ SOLN
INTRAMUSCULAR | Status: AC
  Filled 2016-06-07: qty 30

## 2016-06-07 MED ORDER — SODIUM CHLORIDE 0.9% FLUSH: 3.0000 mL | INTRAVENOUS | Status: DC | PRN

## 2016-06-07 MED ORDER — NITROGLYCERIN 1 MG/10 ML FOR IR/CATH LAB
INTRA_ARTERIAL | Status: DC | PRN
  Administered 2016-06-07: 200 ug

## 2016-06-07 MED ORDER — VERAPAMIL HCL 2.5 MG/ML IV SOLN
INTRAVENOUS | Status: AC
  Filled 2016-06-07: qty 2

## 2016-06-07 MED ORDER — IOPAMIDOL (ISOVUE-370) INJECTION 76%
INTRAVENOUS | Status: DC | PRN
  Administered 2016-06-07: 75 mL via INTRA_ARTERIAL

## 2016-06-07 MED ORDER — HEPARIN (PORCINE) IN NACL 2-0.9 UNIT/ML-% IJ SOLN
INTRAMUSCULAR | Status: AC
  Filled 2016-06-07: qty 1000

## 2016-06-07 MED ORDER — FENTANYL CITRATE (PF) 100 MCG/2ML IJ SOLN
INTRAMUSCULAR | Status: DC | PRN
  Administered 2016-06-07: 50 ug via INTRAVENOUS

## 2016-06-07 MED ORDER — HEPARIN SODIUM (PORCINE) 1000 UNIT/ML IJ SOLN
INTRAMUSCULAR | Status: DC | PRN
  Administered 2016-06-07: 4000 [IU] via INTRAVENOUS

## 2016-06-07 MED ORDER — HEPARIN SODIUM (PORCINE) 1000 UNIT/ML IJ SOLN
INTRAMUSCULAR | Status: AC
  Filled 2016-06-07: qty 1

## 2016-06-07 MED ORDER — MIDAZOLAM HCL 2 MG/2ML IJ SOLN
INTRAMUSCULAR | Status: AC
  Filled 2016-06-07: qty 2

## 2016-06-07 MED ORDER — LIDOCAINE HCL (PF) 1 % IJ SOLN
INTRAMUSCULAR | Status: DC | PRN
  Administered 2016-06-07: 2 mL

## 2016-06-07 MED ORDER — CLONAZEPAM 2 MG PO TABS: 2.0000 mg | ORAL_TABLET | Freq: Two times a day (BID) | ORAL | 0 refills | Status: AC | PRN

## 2016-06-07 MED ORDER — SODIUM CHLORIDE 0.9% FLUSH: 3.0000 mL | Freq: Two times a day (BID) | INTRAVENOUS | Status: DC

## 2016-06-07 MED ORDER — FENTANYL CITRATE (PF) 100 MCG/2ML IJ SOLN
INTRAMUSCULAR | Status: AC
  Filled 2016-06-07: qty 2

## 2016-06-07 MED ORDER — MIDAZOLAM HCL 2 MG/2ML IJ SOLN
INTRAMUSCULAR | Status: DC | PRN
  Administered 2016-06-07: 2 mg via INTRAVENOUS

## 2016-06-07 MED ORDER — NITROGLYCERIN 1 MG/10 ML FOR IR/CATH LAB
INTRA_ARTERIAL | Status: AC
  Filled 2016-06-07: qty 10

## 2016-06-07 MED ORDER — ASPIRIN 81 MG PO CHEW
CHEWABLE_TABLET | ORAL | Status: AC
  Administered 2016-06-07: 81 mg via ORAL
  Filled 2016-06-07: qty 1

## 2016-06-07 SURGICAL SUPPLY — 9 items
CATH OPTITORQUE TIG 4.0 5F (CATHETERS) ×1 IMPLANT
DEVICE RAD COMP TR BAND LRG (VASCULAR PRODUCTS) ×2 IMPLANT
GLIDESHEATH SLEND A-KIT 6F 20G (SHEATH) ×2 IMPLANT
GUIDEWIRE INQWIRE 1.5J.035X260 (WIRE) IMPLANT
INQWIRE 1.5J .035X260CM (WIRE) ×2
KIT HEART LEFT (KITS) ×2 IMPLANT
PACK CARDIAC CATHETERIZATION (CUSTOM PROCEDURE TRAY) ×2 IMPLANT
TRANSDUCER W/STOPCOCK (MISCELLANEOUS) ×2 IMPLANT
TUBING CIL FLEX 10 FLL-RA (TUBING) ×2 IMPLANT

## 2016-06-07 NOTE — Discharge Instructions (Signed)
Radial Site Care °Refer to this sheet in the next few weeks. These instructions provide you with information about caring for yourself after your procedure. Your health care provider may also give you more specific instructions. Your treatment has been planned according to current medical practices, but problems sometimes occur. Call your health care provider if you have any problems or questions after your procedure. °What can I expect after the procedure? °After your procedure, it is typical to have the following: °· Bruising at the radial site that usually fades within 1-2 weeks. °· Blood collecting in the tissue (hematoma) that may be painful to the touch. It should usually decrease in size and tenderness within 1-2 weeks. °Follow these instructions at home: °· Take medicines only as directed by your health care provider. °· You may shower 24-48 hours after the procedure or as directed by your health care provider. Remove the bandage (dressing) and gently wash the site with plain soap and water. Pat the area dry with a clean towel. Do not rub the site, because this may cause bleeding. °· Do not take baths, swim, or use a hot tub until your health care provider approves. °· Check your insertion site every day for redness, swelling, or drainage. °· Do not apply powder or lotion to the site. °· Do not flex or bend the affected arm for 24 hours or as directed by your health care provider. °· Do not push or pull heavy objects with the affected arm for 24 hours or as directed by your health care provider. °· Do not lift over 10 lb (4.5 kg) for 5 days after your procedure or as directed by your health care provider. °· Ask your health care provider when it is okay to: °¨ Return to work or school. °¨ Resume usual physical activities or sports. °¨ Resume sexual activity. °· Do not drive home if you are discharged the same day as the procedure. Have someone else drive you. °· You may drive 24 hours after the procedure  unless otherwise instructed by your health care provider. °· Do not operate machinery or power tools for 24 hours after the procedure. °· If your procedure was done as an outpatient procedure, which means that you went home the same day as your procedure, a responsible adult should be with you for the first 24 hours after you arrive home. °· Keep all follow-up visits as directed by your health care provider. This is important. °Contact a health care provider if: °· You have a fever. °· You have chills. °· You have increased bleeding from the radial site. Hold pressure on the site. °Get help right away if: °· You have unusual pain at the radial site. °· You have redness, warmth, or swelling at the radial site. °· You have drainage (other than a small amount of blood on the dressing) from the radial site. °· The radial site is bleeding, and the bleeding does not stop after 30 minutes of holding steady pressure on the site. °· Your arm or hand becomes pale, cool, tingly, or numb. °This information is not intended to replace advice given to you by your health care provider. Make sure you discuss any questions you have with your health care provider. °Document Released: 01/29/2010 Document Revised: 06/04/2015 Document Reviewed: 07/15/2013 °Elsevier Interactive Patient Education © 2017 [employer] ° °

## 2016-06-07 NOTE — Interval H&P Note (Signed)
History and Physical Interval Note:  06/07/2016 10:56 AM  Rick Church  has presented today for surgery, with the diagnosis of abnormal myoview, sob  The various methods of treatment have been discussed with the patient and family. After consideration of risks, benefits and other options for treatment, the patient has consented to  Procedure(s): Left Heart Cath and Coronary Angiography (N/A) and possible angioplasty as a surgical intervention .  The patient's history has been reviewed, patient examined, no change in status, stable for surgery.  I have reviewed the patient's chart and labs.  Questions were answered to the patient's satisfaction.     Adrian Prows

## 2016-06-07 NOTE — Progress Notes (Addendum)
Called Dr Einar Gip to ask him about medication reconciliation. It  had stop losartan and client states not on losartan and per Dr Einar Gip client advised to continue same medications and client notified and voiced understanding

## 2016-06-07 NOTE — Interval H&P Note (Signed)
History and Physical Interval Note:  06/07/2016 10:56 AM  Rick Church  has presented today for surgery, with the diagnosis of abnormal myoview, sob  The various methods of treatment have been discussed with the patient and family. After consideration of risks, benefits and other options for treatment, the patient has consented to  Procedure(s): Left Heart Cath and Coronary Angiography (N/A) and possible PCI  as a surgical intervention .  The patient's history has been reviewed, patient examined, no change in status, stable for surgery.  I have reviewed the patient's chart and labs.  Questions were answered to the patient's satisfaction.    Ischemic Symptoms? CCS II (Slight limitation of ordinary activity) Anti-ischemic Medical Therapy? Minimal Therapy (1 class of medications) Non-invasive Test Results? High-risk stress test findings: cardiac mortality >3%/yr Prior CABG? No Previous CABG   Patient Information:   1-2V CAD, no prox LAD  A (7)  Indication: 18; Score: 7   Patient Information:   CTO of 1 vessel, no other CAD  U (5)  Indication: 28; Score: 5   Patient Information:   1V CAD with prox LAD  A (8)  Indication: 34; Score: 8   Patient Information:   2V-CAD with prox LAD  A (8)  Indication: 40; Score: 8   Patient Information:   3V-CAD without LMCA  A (8)  Indication: 46; Score: 8   Patient Information:   3V-CAD without LMCA With Abnormal LV systolic function  A (9)  Indication: 48; Score: 9   Patient Information:   LMCA-CAD  A (9)  Indication: 49; Score: 9   Patient Information:   2V-CAD with prox LAD PCI  A (7)  Indication: 62; Score: 7   Patient Information:   2V-CAD with prox LAD CABG  A (8)  Indication: 62; Score: 8   Patient Information:   3V-CAD without LMCA With Low CAD burden(i.e., 3 focal stenoses, low SYNTAX score) PCI  A (7)  Indication: 63; Score: 7   Patient Information:   3V-CAD without LMCA With Low CAD  burden(i.e., 3 focal stenoses, low SYNTAX score) CABG  A (9)  Indication: 63; Score: 9   Patient Information:   3V-CAD without LMCA E06c - Intermediate-high CAD burden (i.e., multiple diffuse lesions, presence of CTO, or high SYNTAX score) PCI  U (4)  Indication: 64; Score: 4   Patient Information:   3V-CAD without LMCA E06c - Intermediate-high CAD burden (i.e., multiple diffuse lesions, presence of CTO, or high SYNTAX score) CABG  A (9)  Indication: 64; Score: 9   Patient Information:   LMCA-CAD With Isolated LMCA stenosis  PCI  U (6)  Indication: 65; Score: 6   Patient Information:   LMCA-CAD With Isolated LMCA stenosis  CABG  A (9)  Indication: 65; Score: 9   Patient Information:   LMCA-CAD Additional CAD, low CAD burden (i.e., 1- to 2-vessel additional involvement, low SYNTAX score) PCI  U (5)  Indication: 66; Score: 5   Patient Information:   LMCA-CAD Additional CAD, low CAD burden (i.e., 1- to 2-vessel additional involvement, low SYNTAX score) CABG  A (9)  Indication: 66; Score: 9   Patient Information:   LMCA-CAD Additional CAD, intermediate-high CAD burden (i.e., 3-vessel involvement, presence of CTO, or high SYNTAX score) PCI  I (3)  Indication: 67; Score: 3   Patient Information:   LMCA-CAD Additional CAD, intermediate-high CAD burden (i.e., 3-vessel involvement, presence of CTO, or high SYNTAX score) CABG  A (9)  Indication: 67; Score: 9  Adrian Prows

## 2016-07-05 ENCOUNTER — Inpatient Hospital Stay (HOSPITAL_COMMUNITY)
Admission: RE | Admit: 2016-07-05 | Discharge: 2016-07-05 | Disposition: A | Discharge status: Home / Self Care | Payer: Medicare Other | Source: Ambulatory Visit

## 2016-07-05 NOTE — Pre-Procedure Instructions (Signed)
Rick Church  07/05/2016      [facility] [place], [place] - [address] AT [address] [place] [address] Phone: [phone] Fax: [phone]  [facility] [place], [place] - [address] AT [facility] [address] [place] [address] Phone: [phone] Fax: [phone]    Your procedure is scheduled on 07/11/16.  Report to [facility] Admitting at 530 A.M.  Call this number if you have problems the morning of surgery:  [phone]   Remember:  Do not eat food or drink liquids after midnight.  Take these medicines the morning of surgery with A SIP OF WATER     Pantoprazole(protonix),fioricet if needed, clonazepam if needed, lyrica if needed  STOP all herbel meds, nsaids (aleve,naproxen,advil,ibuprofen)  prior to surgery starting today including all vitamins/supplements, aspirin, cambia   Do not wear jewelry, make-up or nail polish.  Do not wear lotions, powders, or perfumes, or deoderant.  Do not shave 48 hours prior to surgery.  Men may shave face and neck.  Do not bring valuables to the hospital.  [facility] is not responsible for any belongings or valuables.  Contacts, dentures or bridgework may not be worn into surgery.  Leave your suitcase in the car.  After surgery it may be brought to your room.  For patients admitted to the hospital, discharge time will be determined by your treatment team.  Patients discharged the day of surgery will not be allowed to drive home.   Special instructions:   Special Instructions: [facility] - Preparing for Surgery  Before surgery, you can play an important role.  Because skin is not sterile, your skin needs to be as free of germs as possible.  You can reduce the number of germs on you skin by washing with CHG (chlorahexidine gluconate) soap before surgery.  CHG is an antiseptic cleaner which kills germs and  bonds with the skin to continue killing germs even after washing.  Please DO NOT use if you have an allergy to CHG or antibacterial soaps.  If your skin becomes reddened/irritated stop using the CHG and inform your nurse when you arrive at Short Stay.  Do not shave (including legs and underarms) for at least 48 hours prior to the first CHG shower.  You may shave your face.  Please follow these instructions carefully:   1.  Shower with CHG Soap the night before surgery and the morning of Surgery.  2.  If you choose to wash your hair, wash your hair first as usual with your normal shampoo.  3.  After you shampoo, rinse your hair and body thoroughly to remove the Shampoo.  4.  Use CHG as you would any other liquid soap.  You can apply chg directly  to the skin and wash gently with scrungie or a clean washcloth.  5.  Apply the CHG Soap to your body ONLY FROM THE NECK DOWN.  Do not use on open wounds or open sores.  Avoid contact with your eyes ears, mouth and genitals (private parts).  Wash genitals (private parts)       with your normal soap.  6.  Wash thoroughly, paying special attention to the area where your surgery will be performed.  7.  Thoroughly rinse your body with warm water from the neck down.  8.  DO NOT shower/wash with your  normal soap after using and rinsing off the CHG Soap.  9.  Pat yourself dry with a clean towel.            10.  Wear clean pajamas.            11.  Place clean sheets on your bed the night of your first shower and do not sleep with pets.  Day of Surgery  Do not apply any lotions/deodorants the morning of surgery.  Please wear clean clothes to the hospital/surgery center.  Please read over the  fact sheets that you were given.

## 2016-07-10 MED ORDER — CIPROFLOXACIN IN D5W 400 MG/200ML IV SOLN
400.0000 mg | INTRAVENOUS | Status: DC
  Filled 2016-07-10: qty 200

## 2016-07-10 NOTE — Anesthesia Preprocedure Evaluation (Addendum)
Anesthesia Evaluation  Patient identified by MRN, date of birth, ID band Patient awake    Reviewed: Allergy & Precautions, NPO status , Patient's Chart, lab work & pertinent test results  History of Anesthesia Complications (+) PONV and history of anesthetic complications  Airway Mallampati: I  TM Distance: >3 FB Neck ROM: Full    Dental  (+) Edentulous Upper, Edentulous Lower, Dental Advisory Given   Pulmonary COPD, Current Smoker,    Pulmonary exam normal        Cardiovascular hypertension, + CAD and + Past MI  negative cardio ROS Normal cardiovascular exam+ Valvular Problems/Murmurs AS   Coronary angiogram 06/07/2016: Normal LVEF, 55-60%. Severely ectatic proximal RCA with ulceration and contrast hang-up, which cleared with intracoronary nitroglycerin. Slow flow is evident in the right coronary artery, abnormal stress test probably due to endothelial dysfunction in the RCA distribution. PL branch has a 50% stenosis. Severely ectatic proximal circumflex with slow flow. A small tiny OM branch is occluded chronically. Mild diffuse disease. Long segment proximal to mid LAD diffuse luminal irregularity with mild ectasia, apical LAD shows a 70-80% stenosis..   Neuro/Psych PSYCHIATRIC DISORDERS Anxiety negative neurological ROS     GI/Hepatic Neg liver ROS, hiatal hernia, GERD  ,  Endo/Other  negative endocrine ROS  Renal/GU negative Renal ROS     Musculoskeletal negative musculoskeletal ROS (+)   Abdominal   Peds  Hematology negative hematology ROS (+)   Anesthesia Other Findings Day of surgery medications reviewed with the patient.  Reproductive/Obstetrics                            Anesthesia Physical Anesthesia Plan  ASA: III  Anesthesia Plan: General   Post-op Pain Management:    Induction: Intravenous  PONV Risk Score and Plan: 3 and Ondansetron, Dexamethasone and Scopolamine patch -  Pre-op  Airway Management Planned: Oral ETT  Additional Equipment:   Intra-op Plan:   Post-operative Plan: Extubation in OR  Informed Consent: I have reviewed the patients History and Physical, chart, labs and discussed the procedure including the risks, benefits and alternatives for the proposed anesthesia with the patient or authorized representative who has indicated his/her understanding and acceptance.   Dental advisory given  Plan Discussed with: CRNA, Anesthesiologist and Surgeon  Anesthesia Plan Comments:        Anesthesia Quick Evaluation

## 2016-07-11 ENCOUNTER — Encounter (HOSPITAL_COMMUNITY): Payer: Self-pay | Admitting: General Practice

## 2016-07-11 ENCOUNTER — Ambulatory Visit (HOSPITAL_COMMUNITY): Payer: Medicare Other | Admitting: Anesthesiology

## 2016-07-11 ENCOUNTER — Encounter (HOSPITAL_COMMUNITY)
Admission: RE | Disposition: A | Discharge status: Home / Self Care | Payer: Self-pay | Source: Ambulatory Visit | Attending: General Surgery

## 2016-07-11 ENCOUNTER — Observation Stay (HOSPITAL_COMMUNITY)
Admission: RE | Admit: 2016-07-11 | Discharge: 2016-07-12 | Disposition: A | Discharge status: Home / Self Care | Payer: Medicare Other | Source: Ambulatory Visit | Attending: General Surgery | Admitting: General Surgery

## 2016-07-11 DIAGNOSIS — E7800 Pure hypercholesterolemia, unspecified: Secondary | ICD-10-CM | POA: Insufficient documentation

## 2016-07-11 DIAGNOSIS — Z886 Allergy status to analgesic agent status: Secondary | ICD-10-CM | POA: Insufficient documentation

## 2016-07-11 DIAGNOSIS — Z419 Encounter for procedure for purposes other than remedying health state, unspecified: Secondary | ICD-10-CM

## 2016-07-11 DIAGNOSIS — K8010 Calculus of gallbladder with chronic cholecystitis without obstruction: Secondary | ICD-10-CM | POA: Diagnosis not present

## 2016-07-11 DIAGNOSIS — Z7982 Long term (current) use of aspirin: Secondary | ICD-10-CM | POA: Diagnosis not present

## 2016-07-11 DIAGNOSIS — F17210 Nicotine dependence, cigarettes, uncomplicated: Secondary | ICD-10-CM | POA: Diagnosis not present

## 2016-07-11 DIAGNOSIS — I10 Essential (primary) hypertension: Secondary | ICD-10-CM | POA: Diagnosis not present

## 2016-07-11 DIAGNOSIS — F419 Anxiety disorder, unspecified: Secondary | ICD-10-CM | POA: Insufficient documentation

## 2016-07-11 DIAGNOSIS — J449 Chronic obstructive pulmonary disease, unspecified: Secondary | ICD-10-CM | POA: Insufficient documentation

## 2016-07-11 DIAGNOSIS — I2510 Atherosclerotic heart disease of native coronary artery without angina pectoris: Secondary | ICD-10-CM | POA: Diagnosis not present

## 2016-07-11 DIAGNOSIS — Z880 Allergy status to penicillin: Secondary | ICD-10-CM | POA: Insufficient documentation

## 2016-07-11 DIAGNOSIS — Z9049 Acquired absence of other specified parts of digestive tract: Secondary | ICD-10-CM | POA: Diagnosis not present

## 2016-07-11 DIAGNOSIS — Z79899 Other long term (current) drug therapy: Secondary | ICD-10-CM | POA: Diagnosis not present

## 2016-07-11 DIAGNOSIS — K811 Chronic cholecystitis: Secondary | ICD-10-CM | POA: Diagnosis present

## 2016-07-11 HISTORY — PX: CHOLECYSTECTOMY: SHX55

## 2016-07-11 LAB — BASIC METABOLIC PANEL
ANION GAP: 7 (ref 5–15)
BUN: 24 mg/dL — ABNORMAL HIGH (ref 6–20)
CALCIUM: 8.5 mg/dL — AB (ref 8.9–10.3)
CO2: 22 mmol/L (ref 22–32)
Chloride: 110 mmol/L (ref 101–111)
Creatinine, Ser: 1.19 mg/dL (ref 0.61–1.24)
GFR, EST NON AFRICAN AMERICAN: 58 mL/min — AB (ref >60)
Glucose, Bld: 94 mg/dL (ref 65–99)
Potassium: 3.2 mmol/L — ABNORMAL LOW (ref 3.5–5.1)
Sodium: 139 mmol/L (ref 135–145)

## 2016-07-11 LAB — CBC
HCT: 38.8 % — ABNORMAL LOW (ref 39.0–52.0)
HEMATOCRIT: 39.6 % (ref 39.0–52.0)
HEMOGLOBIN: 12.9 g/dL — AB (ref 13.0–17.0)
HEMOGLOBIN: 12.9 g/dL — AB (ref 13.0–17.0)
MCH: 30.1 pg (ref 26.0–34.0)
MCH: 30 pg (ref 26.0–34.0)
MCHC: 33.2 g/dL (ref 30.0–36.0)
MCHC: 32.6 g/dL (ref 30.0–36.0)
MCV: 90.4 fL (ref 78.0–100.0)
MCV: 92.1 fL (ref 78.0–100.0)
Platelets: 181 10*3/uL (ref 150–400)
Platelets: 181 10*3/uL (ref 150–400)
RBC: 4.29 MIL/uL (ref 4.22–5.81)
RBC: 4.3 MIL/uL (ref 4.22–5.81)
RDW: 15.7 % — ABNORMAL HIGH (ref 11.5–15.5)
RDW: 15.8 % — AB (ref 11.5–15.5)
WBC: 9.4 10*3/uL (ref 4.0–10.5)
WBC: 15.3 10*3/uL — ABNORMAL HIGH (ref 4.0–10.5)

## 2016-07-11 LAB — CREATININE, SERUM
CREATININE: 1.09 mg/dL (ref 0.61–1.24)
GFR calc Af Amer: >60 mL/min (ref >60)
GFR calc non Af Amer: >60 mL/min (ref >60)

## 2016-07-11 SURGERY — LAPAROSCOPIC CHOLECYSTECTOMY
Anesthesia: General | Site: Abdomen

## 2016-07-11 MED ORDER — HYDROMORPHONE HCL 1 MG/ML IJ SOLN
INTRAMUSCULAR | Status: AC
  Filled 2016-07-11: qty 0.5

## 2016-07-11 MED ORDER — ROCURONIUM BROMIDE 10 MG/ML (PF) SYRINGE
PREFILLED_SYRINGE | INTRAVENOUS | Status: AC
  Filled 2016-07-11: qty 5

## 2016-07-11 MED ORDER — BUTALBITAL-APAP-CAFFEINE 50-325-40 MG PO TABS
1.0000 | ORAL_TABLET | Freq: Four times a day (QID) | ORAL | Status: DC | PRN
  Administered 2016-07-11: 2 via ORAL
  Filled 2016-07-11: qty 2

## 2016-07-11 MED ORDER — FENTANYL CITRATE (PF) 250 MCG/5ML IJ SOLN
INTRAMUSCULAR | Status: AC
  Filled 2016-07-11: qty 5

## 2016-07-11 MED ORDER — SCOPOLAMINE 1 MG/3DAYS TD PT72
1.0000 | MEDICATED_PATCH | TRANSDERMAL | Status: DC
  Administered 2016-07-11: 1.5 mg via TRANSDERMAL
  Filled 2016-07-11: qty 1

## 2016-07-11 MED ORDER — PROPOFOL 10 MG/ML IV BOLUS
INTRAVENOUS | Status: DC | PRN
  Administered 2016-07-11: 50 mg via INTRAVENOUS
  Administered 2016-07-11: 100 mg via INTRAVENOUS

## 2016-07-11 MED ORDER — SODIUM CHLORIDE 0.9 % IR SOLN
Status: DC | PRN
  Administered 2016-07-11: 1000 mL

## 2016-07-11 MED ORDER — TRIAMTERENE-HCTZ 37.5-25 MG PO TABS
0.5000 | ORAL_TABLET | Freq: Every day | ORAL | Status: DC
  Filled 2016-07-11: qty 0.5

## 2016-07-11 MED ORDER — TRAZODONE HCL 100 MG PO TABS
100.0000 mg | ORAL_TABLET | Freq: Every day | ORAL | Status: DC
  Filled 2016-07-11: qty 1

## 2016-07-11 MED ORDER — DIPHENHYDRAMINE HCL 12.5 MG/5ML PO ELIX: 12.5000 mg | ORAL_SOLUTION | Freq: Four times a day (QID) | ORAL | Status: DC | PRN

## 2016-07-11 MED ORDER — MECLIZINE HCL 25 MG PO TABS
25.0000 mg | ORAL_TABLET | Freq: Three times a day (TID) | ORAL | Status: DC | PRN
  Filled 2016-07-11: qty 1

## 2016-07-11 MED ORDER — PROMETHAZINE HCL 25 MG/ML IJ SOLN: 6.2500 mg | INTRAMUSCULAR | Status: DC | PRN

## 2016-07-11 MED ORDER — MORPHINE SULFATE (PF) 2 MG/ML IV SOLN: 2.0000 mg | INTRAVENOUS | Status: DC | PRN

## 2016-07-11 MED ORDER — LIDOCAINE HCL (CARDIAC) 20 MG/ML IV SOLN
INTRAVENOUS | Status: DC | PRN
  Administered 2016-07-11: 60 mg via INTRAVENOUS

## 2016-07-11 MED ORDER — 0.9 % SODIUM CHLORIDE (POUR BTL) OPTIME
TOPICAL | Status: DC | PRN
  Administered 2016-07-11: 1000 mL

## 2016-07-11 MED ORDER — EPHEDRINE 5 MG/ML INJ
INTRAVENOUS | Status: AC
  Filled 2016-07-11: qty 10

## 2016-07-11 MED ORDER — ONDANSETRON HCL 4 MG/2ML IJ SOLN
INTRAMUSCULAR | Status: AC
  Filled 2016-07-11: qty 2

## 2016-07-11 MED ORDER — CIPROFLOXACIN IN D5W 400 MG/200ML IV SOLN
INTRAVENOUS | Status: DC | PRN
  Administered 2016-07-11: 400 mg via INTRAVENOUS

## 2016-07-11 MED ORDER — HYDROCODONE-ACETAMINOPHEN 5-325 MG PO TABS: 1.0000 | ORAL_TABLET | ORAL | Status: DC | PRN

## 2016-07-11 MED ORDER — CITALOPRAM HYDROBROMIDE 20 MG PO TABS
40.0000 mg | ORAL_TABLET | Freq: Every day | ORAL | Status: DC
Start: 2016-07-11 — End: 2016-07-12
  Administered 2016-07-11: 40 mg via ORAL
  Filled 2016-07-11: qty 2

## 2016-07-11 MED ORDER — FENTANYL CITRATE (PF) 100 MCG/2ML IJ SOLN
INTRAMUSCULAR | Status: DC | PRN
  Administered 2016-07-11 (×2): 50 ug via INTRAVENOUS
  Administered 2016-07-11: 25 ug via INTRAVENOUS
  Administered 2016-07-11: 50 ug via INTRAVENOUS

## 2016-07-11 MED ORDER — PROMETHAZINE HCL 25 MG PO TABS: 12.5000 mg | ORAL_TABLET | ORAL | Status: DC | PRN

## 2016-07-11 MED ORDER — ACETAMINOPHEN 500 MG PO TABS: 500.0000 mg | ORAL_TABLET | Freq: Four times a day (QID) | ORAL | Status: DC | PRN

## 2016-07-11 MED ORDER — HYDRALAZINE HCL 20 MG/ML IJ SOLN: 10.0000 mg | INTRAMUSCULAR | Status: DC | PRN

## 2016-07-11 MED ORDER — DEXAMETHASONE SODIUM PHOSPHATE 10 MG/ML IJ SOLN
INTRAMUSCULAR | Status: AC
  Filled 2016-07-11: qty 1

## 2016-07-11 MED ORDER — PREGABALIN 75 MG PO CAPS: 75.0000 mg | ORAL_CAPSULE | Freq: Three times a day (TID) | ORAL | Status: DC | PRN

## 2016-07-11 MED ORDER — ONDANSETRON HCL 4 MG/2ML IJ SOLN
INTRAMUSCULAR | Status: DC | PRN
  Administered 2016-07-11: 4 mg via INTRAVENOUS

## 2016-07-11 MED ORDER — DEXTROSE 5 % IV SOLN
INTRAVENOUS | Status: DC | PRN
  Administered 2016-07-11: 40 ug/min via INTRAVENOUS

## 2016-07-11 MED ORDER — PROPOFOL 10 MG/ML IV BOLUS
INTRAVENOUS | Status: AC
  Filled 2016-07-11: qty 20

## 2016-07-11 MED ORDER — POTASSIUM CHLORIDE 2 MEQ/ML IV SOLN
INTRAVENOUS | Status: DC
  Administered 2016-07-11: 13:00:00 via INTRAVENOUS
  Filled 2016-07-11 (×2): qty 1000

## 2016-07-11 MED ORDER — ROCURONIUM BROMIDE 100 MG/10ML IV SOLN
INTRAVENOUS | Status: DC | PRN
  Administered 2016-07-11 (×2): 10 mg via INTRAVENOUS
  Administered 2016-07-11: 50 mg via INTRAVENOUS

## 2016-07-11 MED ORDER — CLONAZEPAM 1 MG PO TABS
2.0000 mg | ORAL_TABLET | Freq: Two times a day (BID) | ORAL | Status: DC | PRN
  Administered 2016-07-11: 2 mg via ORAL
  Filled 2016-07-11: qty 2

## 2016-07-11 MED ORDER — DEXAMETHASONE SODIUM PHOSPHATE 10 MG/ML IJ SOLN
INTRAMUSCULAR | Status: DC | PRN
  Administered 2016-07-11: 10 mg via INTRAVENOUS

## 2016-07-11 MED ORDER — BUPIVACAINE-EPINEPHRINE 0.25% -1:200000 IJ SOLN
INTRAMUSCULAR | Status: DC | PRN
  Administered 2016-07-11: 20 mL

## 2016-07-11 MED ORDER — HYDROMORPHONE HCL 1 MG/ML IJ SOLN
0.2500 mg | INTRAMUSCULAR | Status: DC | PRN
  Administered 2016-07-11 (×2): 0.5 mg via INTRAVENOUS

## 2016-07-11 MED ORDER — CHLORHEXIDINE GLUCONATE CLOTH 2 % EX PADS: 6.0000 | MEDICATED_PAD | Freq: Once | CUTANEOUS | Status: DC

## 2016-07-11 MED ORDER — PANTOPRAZOLE SODIUM 40 MG IV SOLR
40.0000 mg | Freq: Every day | INTRAVENOUS | Status: DC
  Filled 2016-07-11: qty 40

## 2016-07-11 MED ORDER — SUGAMMADEX SODIUM 200 MG/2ML IV SOLN
INTRAVENOUS | Status: DC | PRN
  Administered 2016-07-11: 150 mg via INTRAVENOUS

## 2016-07-11 MED ORDER — DIPHENHYDRAMINE HCL 50 MG/ML IJ SOLN: 12.5000 mg | Freq: Four times a day (QID) | INTRAMUSCULAR | Status: DC | PRN

## 2016-07-11 MED ORDER — ONDANSETRON HCL 4 MG/2ML IJ SOLN: 4.0000 mg | Freq: Four times a day (QID) | INTRAMUSCULAR | Status: DC | PRN

## 2016-07-11 MED ORDER — AMLODIPINE BESYLATE 10 MG PO TABS
10.0000 mg | ORAL_TABLET | Freq: Every day | ORAL | Status: DC
  Administered 2016-07-11: 10 mg via ORAL
  Filled 2016-07-11: qty 1

## 2016-07-11 MED ORDER — ENOXAPARIN SODIUM 40 MG/0.4ML ~~LOC~~ SOLN
40.0000 mg | SUBCUTANEOUS | Status: DC
  Filled 2016-07-11: qty 0.4

## 2016-07-11 MED ORDER — CLONAZEPAM 2 MG PO TABS: 2.0000 mg | ORAL_TABLET | Freq: Two times a day (BID) | ORAL | Status: DC | PRN

## 2016-07-11 MED ORDER — LACTATED RINGERS IV SOLN
INTRAVENOUS | Status: DC | PRN
  Administered 2016-07-11: 07:00:00 via INTRAVENOUS

## 2016-07-11 MED ORDER — SODIUM CHLORIDE 0.9 % IV SOLN
INTRAVENOUS | Status: DC | PRN
  Administered 2016-07-11: .1 mL

## 2016-07-11 MED ORDER — MORPHINE SULFATE (PF) 4 MG/ML IV SOLN
2.0000 mg | INTRAVENOUS | Status: DC | PRN
  Administered 2016-07-11 – 2016-07-12 (×2): 4 mg via INTRAVENOUS
  Filled 2016-07-11 (×2): qty 1

## 2016-07-11 MED ORDER — IOPAMIDOL (ISOVUE-300) INJECTION 61%
INTRAVENOUS | Status: AC
  Filled 2016-07-11: qty 50

## 2016-07-11 MED ORDER — SUGAMMADEX SODIUM 200 MG/2ML IV SOLN
INTRAVENOUS | Status: AC
  Filled 2016-07-11: qty 2

## 2016-07-11 MED ORDER — ONDANSETRON 4 MG PO TBDP: 4.0000 mg | ORAL_TABLET | Freq: Four times a day (QID) | ORAL | Status: DC | PRN

## 2016-07-11 MED ORDER — METHOCARBAMOL 500 MG PO TABS: 500.0000 mg | ORAL_TABLET | Freq: Four times a day (QID) | ORAL | Status: DC | PRN

## 2016-07-11 MED ORDER — BUPIVACAINE-EPINEPHRINE (PF) 0.25% -1:200000 IJ SOLN
INTRAMUSCULAR | Status: AC
  Filled 2016-07-11: qty 30

## 2016-07-11 SURGICAL SUPPLY — 45 items
ADH SKN CLS APL DERMABOND .7 (GAUZE/BANDAGES/DRESSINGS) ×2
APPLIER CLIP 5 13 M/L LIGAMAX5 (MISCELLANEOUS) ×3
APR CLP MED LRG 5 ANG JAW (MISCELLANEOUS) ×2
BAG SPEC RTRVL 10 TROC 200 (ENDOMECHANICALS) ×2
BLADE CLIPPER SURG (BLADE) IMPLANT
CANISTER SUCT 3000ML PPV (MISCELLANEOUS) ×3 IMPLANT
CHLORAPREP W/TINT 26ML (MISCELLANEOUS) ×3 IMPLANT
CLIP APPLIE 5 13 M/L LIGAMAX5 (MISCELLANEOUS) ×2 IMPLANT
COVER MAYO STAND STRL (DRAPES) ×3 IMPLANT
COVER SURGICAL LIGHT HANDLE (MISCELLANEOUS) ×3 IMPLANT
DERMABOND ADVANCED (GAUZE/BANDAGES/DRESSINGS) ×1
DERMABOND ADVANCED .7 DNX12 (GAUZE/BANDAGES/DRESSINGS) ×2 IMPLANT
DRAPE C-ARM 42X72 X-RAY (DRAPES) ×3 IMPLANT
ELECT REM PT RETURN 9FT ADLT (ELECTROSURGICAL) ×3
ELECTRODE REM PT RTRN 9FT ADLT (ELECTROSURGICAL) ×2 IMPLANT
FILTER SMOKE EVAC LAPAROSHD (FILTER) ×2 IMPLANT
GLOVE BIO SURGEON STRL SZ8 (GLOVE) ×3 IMPLANT
GLOVE BIOGEL PI IND STRL 8 (GLOVE) ×2 IMPLANT
GLOVE BIOGEL PI INDICATOR 8 (GLOVE) ×1
GOWN STRL REUS W/ TWL LRG LVL3 (GOWN DISPOSABLE) ×4 IMPLANT
GOWN STRL REUS W/ TWL XL LVL3 (GOWN DISPOSABLE) ×2 IMPLANT
GOWN STRL REUS W/TWL LRG LVL3 (GOWN DISPOSABLE) ×6
GOWN STRL REUS W/TWL XL LVL3 (GOWN DISPOSABLE) ×3
KIT BASIN OR (CUSTOM PROCEDURE TRAY) ×3 IMPLANT
KIT ROOM TURNOVER OR (KITS) ×3 IMPLANT
L-HOOK LAP DISP 36CM (ELECTROSURGICAL) ×3
LHOOK LAP DISP 36CM (ELECTROSURGICAL) ×2 IMPLANT
NEEDLE 22X1 1/2 (OR ONLY) (NEEDLE) ×3 IMPLANT
NS IRRIG 1000ML POUR BTL (IV SOLUTION) ×3 IMPLANT
PAD ARMBOARD 7.5X6 YLW CONV (MISCELLANEOUS) ×3 IMPLANT
PENCIL BUTTON HOLSTER BLD 10FT (ELECTRODE) ×3 IMPLANT
POUCH RETRIEVAL ECOSAC 10 (ENDOMECHANICALS) ×2 IMPLANT
POUCH RETRIEVAL ECOSAC 10MM (ENDOMECHANICALS) ×1
SCISSORS LAP 5X35 DISP (ENDOMECHANICALS) ×3 IMPLANT
SET CHOLANGIOGRAPH 5 50 .035 (SET/KITS/TRAYS/PACK) ×3 IMPLANT
SET IRRIG TUBING LAPAROSCOPIC (IRRIGATION / IRRIGATOR) ×5 IMPLANT
SLEEVE ENDOPATH XCEL 5M (ENDOMECHANICALS) ×6 IMPLANT
SPECIMEN JAR SMALL (MISCELLANEOUS) ×3 IMPLANT
SUT VIC AB 4-0 PS2 27 (SUTURE) ×3 IMPLANT
TOWEL OR 17X24 6PK STRL BLUE (TOWEL DISPOSABLE) ×3 IMPLANT
TOWEL OR 17X26 10 PK STRL BLUE (TOWEL DISPOSABLE) ×3 IMPLANT
TRAY LAPAROSCOPIC MC (CUSTOM PROCEDURE TRAY) ×3 IMPLANT
TROCAR XCEL BLUNT TIP 100MML (ENDOMECHANICALS) ×3 IMPLANT
TROCAR XCEL NON-BLD 5MMX100MML (ENDOMECHANICALS) ×3 IMPLANT
TUBING INSUFFLATION (TUBING) ×3 IMPLANT

## 2016-07-11 NOTE — Anesthesia Procedure Notes (Signed)
Procedure Name: Intubation Date/Time: 07/11/2016 7:31 AM Performed by: Izora Gala Pre-anesthesia Checklist: Patient identified, Emergency Drugs available, Suction available and Patient being monitored Patient Re-evaluated:Patient Re-evaluated prior to inductionOxygen Delivery Method: Circle system utilized Preoxygenation: Pre-oxygenation with 100% oxygen Intubation Type: IV induction Ventilation: Mask ventilation without difficulty and Oral airway inserted - appropriate to patient size Laryngoscope Size: Miller and 3 Grade View: Grade I Tube type: Oral Tube size: 7.5 mm Number of attempts: 1 Airway Equipment and Method: Stylet Placement Confirmation: ETT inserted through vocal cords under direct vision and positive ETCO2 Secured at: 22 cm Tube secured with: Tape Dental Injury: Teeth and Oropharynx as per pre-operative assessment

## 2016-07-11 NOTE — Progress Notes (Signed)
Pt refused to stay on unit after charge nurse told pt not to go off the floor.

## 2016-07-11 NOTE — Progress Notes (Addendum)
Patient approached this Probation officer at the desk saying "Are you my nurse? Why cant I not go down to the cafeteria with this damn pole? " patient was very upset cussing regarding going to the cafeteria.Explained to the patient about the policy but patient stated no one can hold him down and that he will try to go no matter what. Md paged and explained to the patient but patient continue to cuss this writer saying until when will he wait,2-3 hours? I have explained that I need an order for him to go off unit but if he insist I have no choice but to document his behavior, Md call with new orders.

## 2016-07-11 NOTE — Op Note (Signed)
07/11/2016  8:30 AM  PATIENT:  Rick Church  76 y.o. male  PRE-OPERATIVE DIAGNOSIS:  symptomatic cholelithiasis  POST-OPERATIVE DIAGNOSIS:  Chronic cholecystitis  PROCEDURE:  Procedure(s): LAPAROSCOPIC CHOLECYSTECTOMY  SURGEON:  Surgeon(s): Georganna Skeans, MD  ASSISTANTS: none   ANESTHESIA:   local and general  EBL:  No intake/output data recorded.  BLOOD ADMINISTERED:none  DRAINS: none   SPECIMEN:  Excision  DISPOSITION OF SPECIMEN:  PATHOLOGY  COUNTS:  YES  DICTATION: .Dragon Dictation Findings: Evidence of chronic cholecystitis, unable to do a cholangiogram despite several attempts  Procedure in detail: [place] presents for cholecystectomy. He was identified in the preop holding area. Informed consent was obtained. He received intravenous antibiotics. He was brought to the operating room and general endotracheal anesthesia was administered by the anesthesia staff. His abdomen was prepped and draped in a sterile fashion. Time out procedure was performed.The infraumbilical region was infiltrated with local. Infraumbilical incision was made. Subcutaneous tissues were dissected down revealing the anterior fascia. This was divided sharply along the midline. Peritoneal cavity was entered under direct vision without complication. A 0 Vicryl pursestring was placed around the fascial opening. Hassan trocar was inserted into the abdomen. The abdomen was insufflated with carbon dioxide in standard fashion. Under direct vision a 5 mm epigastric and 5 mm right lateral ports 2 were placed. Local was used at each port site. Laparoscopic exploration revealed multiple omental adhesions to the dome and body of the gallbladder. These were gently removed and the dome the gallbladder was retracted superior medially. Some further filmy adhesions were removed off the infundibulum. The infundibulum was retracted inferior laterally. Dissection began laterally and progressed medially identifying the  cystic duct. Dissection continued until a critical view was obtained between the cystic duct, the liver, in the gallbladder. A clip was placed on the infundibular cystic duct junction. A small nick was made in the cystic duct and a cholangiocatheter was inserted. There seemed to be a valve in the cystic duct and I could not get the cholangiogram catheter to seat with a clip despite multiple attempts. The cystic duct was milked and there were no retained stones in it. 3 clips were placed on the cystic duct proximally and it was divided. Further dissection revealed both a small anterior and a larger posterior branch of the cystic artery. These were clipped twice proximally and divided distally with cautery. The other was taken off the liver bed using cautery achieving good hemostasis along the way. The gallbladder was placed in a bag and removed from the abdomen via the infraumbilical port site. Liver bed was cauterized to get excellent hemostasis. Clips remain in good position. The area was copiously irrigated and irrigation fluid returned clear. The liver bed was then dry. Ports were removed under direct vision. Pneumoperitoneum was released. Infraumbilical fascia was closed by tying the pursestring. All 4 wounds were irrigated and the skin of each was closed with running 4 Vicryl subcuticular followed by Dermabond. All counts were correct. He tolerated procedure well without apparent complications was taken recovery in stable condition.  PATIENT DISPOSITION:  PACU - hemodynamically stable.   Delay start of Pharmacological VTE agent (>24hrs) due to surgical blood loss or risk of bleeding:  no  Georganna Skeans, MD, MPH, FACS Pager: [phone]  7/2/20188:30 AM

## 2016-07-11 NOTE — Interval H&P Note (Signed)
History and Physical Interval Note:  07/11/2016 7:13 AM  Rick Church  has presented today for surgery, with the diagnosis of symptomatic cholelithiasis  The various methods of treatment have been discussed with the patient and family. After consideration of risks, benefits and other options for treatment, the patient has consented to  Procedure(s): LAPAROSCOPIC CHOLECYSTECTOMY WITH INTRAOPERATIVE CHOLANGIOGRAM (N/A) as a surgical intervention .  The patient's history has been reviewed, patient examined, no change in status, stable for surgery.  I have reviewed the patient's chart and labs.  Questions were answered to the patient's satisfaction.     [name] E

## 2016-07-11 NOTE — Progress Notes (Signed)
Patient again went off unit.  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

## 2016-07-11 NOTE — H&P (Signed)
Rick Church is an 76 y.o. male.   Chief Complaint: N/V, gallstones HPI: Rick Church presents for laparoscopic cholecystectomy. He underwent pre-op cardiac W/U including catheterization. He was cleared by Dr. Einar Gip.  Past Medical History:  Diagnosis Date  . Acute respiratory distress syndrome (ARDS) (HCC)   . Anxiety   . Aortic stenosis    mild AS 01/2014 echo  . Arthritis   . Complication of anesthesia   . COPD (chronic obstructive pulmonary disease) ([place])   . Ejection fraction   . GERD (gastroesophageal reflux disease)   . Headache   . High cholesterol   . History of hiatal hernia   . Hypertension   . Legionnaire's disease ([place])   . Migraine   . Neck pain   . NSTEMI (non-ST elevated myocardial infarction) ([place])    12/2013; cath non-obstructive CAD  . Panic attack   . Pneumonia   . PONV (postoperative nausea and vomiting)   . 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 BACK SURGERY    . CARPAL TUNNEL RELEASE Bilateral   . FOOT NEUROMA SURGERY Left 09/29/2101  . LEFT HEART CATH AND CORONARY ANGIOGRAPHY N/A 06/07/2016   Procedure: Left Heart Cath and Coronary Angiography;  Surgeon: Adrian Prows, MD;  Location: [facility] CV LAB;  Service: Cardiovascular;  Laterality: N/A;  . LEFT HEART CATHETERIZATION WITH CORONARY ANGIOGRAM N/A 01/06/2014   Procedure: LEFT HEART CATHETERIZATION WITH CORONARY ANGIOGRAM;  Surgeon: Peter M Martinique, MD;  Location: [facility] CATH LAB;  Service: Cardiovascular;  Laterality: N/A;  . NECK SURGERY    . ROTATOR CUFF REPAIR Left   . SHOULDER SURGERY Right 1998, 2002  . TONSILLECTOMY    . TRACHEOSTOMY     feinstein  . TRACHEOSTOMY CLOSURE      Family History  Problem Relation Age of Onset  . Hypertension Mother   . Brain cancer Mother   . Lung cancer Mother   . Hypertension Brother   . Cervical cancer Sister   . Hypertension Father   . Cerebral aneurysm Father    Social History:  reports that he has been smoking  Cigarettes.  He started smoking about 59 years ago. He has been smoking about 1.50 packs per day. He has never used smokeless tobacco. He reports that he does not drink alcohol or use drugs.  Allergies:  Allergies  Allergen Reactions  . Atorvastatin Other (See Comments)    leg myalgias  . Indocin [Indomethacin] Nausea Only and Other (See Comments)    dizzy  . Penicillins Hives    Tolerated a dose of CEFEPIME 12/31/13  . Buspirone     UNSPECIFIED INTOLERANCE  . Restoril [Temazepam]     UNSPECIFIED INTOLERANCE  . Toprol Xl [Metoprolol Tartrate]     UNSPECIFIED REACTION    . Asa [Aspirin] Itching and Rash  . Codeine Nausea And Vomiting  . Hytrin [Terazosin] Nausea And Vomiting       . Neurontin [Gabapentin] Nausea And Vomiting       . Oxycodone Nausea Only    "Deathly sick"  . Zestril [Lisinopril] Other (See Comments)    UNSPECIFIED SPECIFIC REACTION  Makes him feel really bad, doesn't feel like getting up in the morning     Medications Prior to Admission  Medication Sig Dispense Refill  . acetaminophen (TYLENOL) 500 MG tablet Take 500 mg by mouth every 6 (six) hours as needed (for pain.).    Marland Kitchen amLODipine (NORVASC) 10 MG tablet TAKE  5 MG BY MOUTH AT BEDTIME  1  . aspirin EC 81 MG tablet Take 81 mg by mouth every evening.    . butalbital-acetaminophen-caffeine (FIORICET, ESGIC) 50-325-40 MG per tablet Take 1-2 tablets by mouth every 6 (six) hours as needed for migraine. migraines  1  . CAMBIA 50 MG PACK TAKE AS NEEDED FOR SEVERE MIGRAINE 9 each 11  . citalopram (CELEXA) 40 MG tablet Take 40 mg by mouth at bedtime.    . clonazePAM (KLONOPIN) 2 MG tablet Take 1 tablet (2 mg total) by mouth 2 (two) times daily as needed for anxiety. 30 tablet 0  . doxycycline (VIBRA-TABS) 100 MG tablet Take 100 mg by mouth See admin instructions. Take 1 tablet every 36 hours after tick bite  0  . ibuprofen (ADVIL,MOTRIN) 200 MG tablet Take 400 mg by mouth every 6 (six) hours as needed.    .  meclizine (ANTIVERT) 25 MG tablet Take 25 mg by mouth 3 (three) times daily as needed for dizziness.    . Multiple Vitamin (MULTIVITAMIN WITH MINERALS) TABS tablet Take 1 tablet by mouth at bedtime.    . pantoprazole (PROTONIX) 40 MG tablet Take 1 tablet (40 mg total) by mouth daily. (Patient taking differently: Take 40 mg by mouth daily as needed (heartburn). ) 30 tablet 0  . pregabalin (LYRICA) 75 MG capsule Take 1 capsule (75 mg total) by mouth 3 (three) times daily. (Patient taking differently: Take 75 mg by mouth 3 (three) times daily as needed (for pain.). ) 90 capsule 0  . promethazine (PHENERGAN) 12.5 MG tablet TAKE 1 TABLET BY MOUTH EVERY 6 HOURS AS NEEDED FOR DIZZINESS OR SICKNESS  0  . simvastatin (ZOCOR) 80 MG tablet Take 40 mg by mouth at bedtime.    . traZODone (DESYREL) 100 MG tablet TAKE 100 MG TABLET BY MOUTH AT BEDTIME  6  . triamterene-hydrochlorothiazide (MAXZIDE-25) 37.5-25 MG tablet Take 0.5 tablets by mouth daily.     . valsartan (DIOVAN) 160 MG tablet take 160 mg tablet by mouth at bedtime  3  . ondansetron (ZOFRAN) 4 MG tablet Take 4 mg by mouth every 8 (eight) hours as needed for nausea.       No results found for this or any previous visit (from the past 48 hour(s)). No results found.  Review of Systems  Reason unable to perform ROS: other.    Blood pressure (!) 105/46, pulse (!) 49, temperature 98 F (36.7 C), temperature source Oral, resp. rate 18, height 5\' 7"  (1.702 m), weight 77.1 kg (170 lb), SpO2 95 %. Physical Exam  Constitutional: He is oriented to person, place, and time. He appears well-developed and well-nourished.  HENT:  Head: Normocephalic.  Right Ear: External ear normal.  Left Ear: External ear normal.  Mouth/Throat: Oropharynx is clear and moist.  Neck: Neck supple.  Cardiovascular: Normal rate and normal heart sounds.   Respiratory: Effort normal and breath sounds normal. No respiratory distress. He has no wheezes.  GI: Soft. He exhibits no  distension. There is no tenderness. There is no rebound and no guarding.  Neurological: He is alert and oriented to person, place, and time.  Skin: Skin is warm.  Psychiatric: He has a normal mood and affect.     Assessment/Plan Symptomatic cholelithiasis - for laparoscopic cholecystectomy, possible cholangiogram today. Procedure, risks, and benefits again discussed and he agrees. Plan 23h obs post-op.  Zenovia Jarred, MD 07/11/2016, 6:50 AM

## 2016-07-11 NOTE — Progress Notes (Addendum)
Patient returned to unit and to his room and requested for pain medications due to headache and abdominal pain, and klonopin.  Administered PRN pain medications Fioricet and Morphine Inj, and klonopin as ordered.  Will monitor.

## 2016-07-11 NOTE — Transfer of Care (Signed)
Immediate Anesthesia Transfer of Care Note  Patient: Rick Church  Procedure(s) Performed: Procedure(s): LAPAROSCOPIC CHOLECYSTECTOMY (N/A)  Patient Location: PACU  Anesthesia Type:General  Level of Consciousness: awake, sedated, patient cooperative and responds to stimulation  Airway & Oxygen Therapy: Patient Spontanous Breathing and Patient connected to face mask oxygen  Post-op Assessment: Report given to RN, Post -op Vital signs reviewed and stable and Patient moving all extremities  Post vital signs: Reviewed and stable  Last Vitals:  Vitals:   07/11/16 0624  BP: (!) 105/46  Pulse: (!) 49  Resp: 18  Temp: 36.7 C    Last Pain:  Vitals:   07/11/16 0624  TempSrc: Oral      Patients Stated Pain Goal: 2 (64/35/39 1225)  Complications: No apparent anesthesia complications

## 2016-07-11 NOTE — Progress Notes (Signed)
Patient returned to [facility] [address] bringing with him some burgers and soda.  Patient eating inside room, denies any pain.

## 2016-07-11 NOTE — Progress Notes (Addendum)
This RN was informed by Agricultural consultant that patient again went off unit despite advice not to go out.  Charge RN will inform security and monitor his whereabouts. Dr. Grandville Silos aware of patient's behavior and had an off unit privilege order.  Will monitor patient's whereabouts.

## 2016-07-11 NOTE — Anesthesia Postprocedure Evaluation (Signed)
Anesthesia Post Note  Patient: Rick Church  Procedure(s) Performed: Procedure(s) (LRB): LAPAROSCOPIC CHOLECYSTECTOMY (N/A)     Patient location during evaluation: PACU Anesthesia Type: General Level of consciousness: sedated Pain management: pain level controlled Vital Signs Assessment: post-procedure vital signs reviewed and stable Respiratory status: spontaneous breathing and respiratory function stable Cardiovascular status: stable Anesthetic complications: no    Last Vitals:  Vitals:   07/11/16 0925 07/11/16 0930  BP: (!) 127/55   Pulse: (!) 44 (!) 44  Resp: 11 10  Temp:  36.1 C    Last Pain:  Vitals:   07/11/16 0841  TempSrc:   PainSc: 6                  Chriss Redel DANIEL

## 2016-07-12 ENCOUNTER — Encounter (HOSPITAL_COMMUNITY): Payer: Self-pay | Admitting: General Surgery

## 2016-07-12 DIAGNOSIS — K8010 Calculus of gallbladder with chronic cholecystitis without obstruction: Secondary | ICD-10-CM | POA: Diagnosis not present

## 2016-07-12 LAB — CBC
HCT: 37.6 % — ABNORMAL LOW (ref 39.0–52.0)
Hemoglobin: 12.4 g/dL — ABNORMAL LOW (ref 13.0–17.0)
MCH: 30.5 pg (ref 26.0–34.0)
MCHC: 33 g/dL (ref 30.0–36.0)
MCV: 92.4 fL (ref 78.0–100.0)
PLATELETS: 179 10*3/uL (ref 150–400)
RBC: 4.07 MIL/uL — AB (ref 4.22–5.81)
RDW: 15.7 % — ABNORMAL HIGH (ref 11.5–15.5)
WBC: 15.1 10*3/uL — AB (ref 4.0–10.5)

## 2016-07-12 LAB — BASIC METABOLIC PANEL
ANION GAP: 7 (ref 5–15)
BUN: 23 mg/dL — AB (ref 6–20)
CHLORIDE: 106 mmol/L (ref 101–111)
CO2: 27 mmol/L (ref 22–32)
Calcium: 8.6 mg/dL — ABNORMAL LOW (ref 8.9–10.3)
Creatinine, Ser: 1.03 mg/dL (ref 0.61–1.24)
GFR calc Af Amer: >60 mL/min (ref >60)
GLUCOSE: 104 mg/dL — AB (ref 65–99)
POTASSIUM: 3.2 mmol/L — AB (ref 3.5–5.1)
Sodium: 140 mmol/L (ref 135–145)

## 2016-07-12 MED ORDER — TRAMADOL HCL 50 MG PO TABS
100.0000 mg | ORAL_TABLET | Freq: Four times a day (QID) | ORAL | Status: DC | PRN
  Administered 2016-07-12: 100 mg via ORAL
  Filled 2016-07-12: qty 2

## 2016-07-12 MED ORDER — TRAMADOL HCL 50 MG PO TABS: 100.0000 mg | ORAL_TABLET | Freq: Four times a day (QID) | ORAL | 0 refills | Status: DC | PRN

## 2016-07-12 MED ORDER — POTASSIUM CHLORIDE CRYS ER 20 MEQ PO TBCR
20.0000 meq | EXTENDED_RELEASE_TABLET | Freq: Once | ORAL | Status: DC
  Filled 2016-07-12: qty 1

## 2016-07-12 NOTE — Progress Notes (Signed)
1 Day Post-Op   Subjective/Chief Complaint: Walked around the hospital quite a bit last night. Refuses Norco as he claims it causes N/V. Had morphine for pain last night.   Objective: Vital signs in last 24 hours: Temp:  [97 F (36.1 C)-98.8 F (37.1 C)] 98 F (36.7 C) (07/03 0640) Pulse Rate:  [44-55] 53 (07/03 0640) Resp:  [10-22] 16 (07/03 0640) BP: (109-130)/(46-71) 109/68 (07/03 0640) SpO2:  [91 %-99 %] 98 % (07/03 0640) Weight:  [78.3 kg (172 lb 9.6 oz)] 78.3 kg (172 lb 9.6 oz) (07/02 1008) Last BM Date: 07/10/16  Intake/Output from previous day: 07/02 0701 - 07/03 0700 In: 2336.8 [P.O.:1406; I.V.:805.8] Out: 0  Intake/Output this shift: No intake/output data recorded.  General appearance: cooperative Resp: clear to auscultation bilaterally Cardio: regular rate and rhythm GI: soft, incisions CDI  Lab Results:   Recent Labs  07/11/16 1044 07/12/16 0433  WBC 15.3* 15.1*  HGB 12.9* 12.4*  HCT 39.6 37.6*  PLT 181 179   BMET  Recent Labs  07/11/16 0634 07/11/16 1044 07/12/16 0433  NA 139  --  140  K 3.2*  --  3.2*  CL 110  --  106  CO2 22  --  27  GLUCOSE 94  --  104*  BUN 24*  --  23*  CREATININE 1.19 1.09 1.03  CALCIUM 8.5*  --  8.6*   PT/INR No results for input(s): LABPROT, INR in the last 72 hours. ABG No results for input(s): PHART, HCO3 in the last 72 hours.  Invalid input(s): PCO2, PO2  Studies/Results: No results found.  Anti-infectives: Anti-infectives    Start     Dose/Rate Route Frequency Ordered Stop   07/10/16 0804  ciprofloxacin (CIPRO) IVPB 400 mg  Status:  Discontinued     400 mg 200 mL/hr over 60 Minutes Intravenous On call to O.R. 07/10/16 0804 07/11/16 0559      Assessment/Plan: s/p Procedure(s): LAPAROSCOPIC CHOLECYSTECTOMY (N/A) POD#1 Try Ultram for pain Replace hypokalemia Hope to D/C later today  LOS: 0 days    Daly Whipkey E 07/12/2016

## 2016-07-12 NOTE — Progress Notes (Signed)
Patient is wheeled back to the unit by a CNA from [place].

## 2016-07-12 NOTE — Progress Notes (Signed)
Staff at [facility] reported that patient was spotted in their hallway and then patient was spotted in the hallway of [facility].  Will continue to monitor patient's whereabouts.

## 2016-07-12 NOTE — Progress Notes (Signed)
Patient asking about discharge, explained to the patient that per MD instruction, we need to try if the pain medicine will work for him before discharge . Patient start getting upset and irritated stating no matter what he will go home. Patient stated " I had walked out from this damn hospital before, I can do that again"Explained to the patient that we are not gonna hold him in the hospital and that we just want to be sure he would be alright upon discharge. Patient still continue to cuss stating he will walked out no matter what. Md overheard conversation with the patient. Discharge order given.

## 2016-07-12 NOTE — Discharge Summary (Signed)
Physician Discharge Summary  Patient ID: Rick Church MRN: [id number] DOB/AGE: 05-21-1940 76 y.o.  Admit date: 07/11/2016 Discharge date: 07/12/2016  Admission Diagnoses:chronic cholecystitis  Discharge Diagnoses: S/P laparoscopic cholecystectomy Active Problems:   Chronic cholecystitis   Discharged Condition: good  Hospital Course: He tolerated surgery well. Post-op, he did not want to take Norco so this was changed to Ultram. He frequently walked off the floor. Ready for D/C POD#1.  Consults: None  Significant Diagnostic Studies: none  Treatments: surgery: above  Discharge Exam: Blood pressure 109/68, pulse (!) 53, temperature 98 F (36.7 C), temperature source Oral, resp. rate 16, height 5\' 7"  (1.702 m), weight 78.3 kg (172 lb 9.6 oz), SpO2 98 %. see note  Disposition: 01-Home or Self Care   Allergies as of 07/12/2016      Reactions   Atorvastatin Other (See Comments)   leg myalgias   Indocin [indomethacin] Nausea Only, Other (See Comments)   dizzy   Penicillins Hives   Tolerated a dose of CEFEPIME 12/31/13   Buspirone    UNSPECIFIED INTOLERANCE   Restoril [temazepam]    UNSPECIFIED INTOLERANCE   Toprol Xl [metoprolol Tartrate]    UNSPECIFIED REACTION    Asa [aspirin] Itching, Rash   Codeine Nausea And Vomiting   Hytrin [terazosin] Nausea And Vomiting      Neurontin [gabapentin] Nausea And Vomiting      Oxycodone Nausea Only   "Deathly sick"   Zestril [lisinopril] Other (See Comments)   UNSPECIFIED SPECIFIC REACTION  Makes him feel really bad, doesn't feel like getting up in the morning       Medication List    TAKE these medications   acetaminophen 500 MG tablet Commonly known as:  TYLENOL Take 500 mg by mouth every 6 (six) hours as needed (for pain.).   amLODipine 10 MG tablet Commonly known as:  NORVASC TAKE 5 MG BY MOUTH AT BEDTIME   aspirin EC 81 MG tablet Take 81 mg by mouth every evening.   butalbital-acetaminophen-caffeine 50-325-40 MG  tablet Commonly known as:  FIORICET, ESGIC Take 1-2 tablets by mouth every 6 (six) hours as needed for migraine. migraines   CAMBIA 50 MG Pack Generic drug:  Diclofenac Potassium TAKE AS NEEDED FOR SEVERE MIGRAINE   citalopram 40 MG tablet Commonly known as:  CELEXA Take 40 mg by mouth at bedtime.   clonazePAM 2 MG tablet Commonly known as:  KLONOPIN Take 1 tablet (2 mg total) by mouth 2 (two) times daily as needed for anxiety.   doxycycline 100 MG tablet Commonly known as:  VIBRA-TABS Take 100 mg by mouth See admin instructions. Take 1 tablet every 36 hours after tick bite   ibuprofen 200 MG tablet Commonly known as:  ADVIL,MOTRIN Take 400 mg by mouth every 6 (six) hours as needed.   meclizine 25 MG tablet Commonly known as:  ANTIVERT Take 25 mg by mouth 3 (three) times daily as needed for dizziness.   multivitamin with minerals Tabs tablet Take 1 tablet by mouth at bedtime.   ondansetron 4 MG tablet Commonly known as:  ZOFRAN Take 4 mg by mouth every 8 (eight) hours as needed for nausea.   pantoprazole 40 MG tablet Commonly known as:  PROTONIX Take 1 tablet (40 mg total) by mouth daily. What changed:  when to take this  reasons to take this   pregabalin 75 MG capsule Commonly known as:  LYRICA Take 1 capsule (75 mg total) by mouth 3 (three) times daily. What changed:  when to take this  reasons to take this   promethazine 12.5 MG tablet Commonly known as:  PHENERGAN TAKE 1 TABLET BY MOUTH EVERY 6 HOURS AS NEEDED FOR DIZZINESS OR SICKNESS   simvastatin 80 MG tablet Commonly known as:  ZOCOR Take 40 mg by mouth at bedtime.   traMADol 50 MG tablet Commonly known as:  ULTRAM Take 2 tablets (100 mg total) by mouth every 6 (six) hours as needed for moderate pain or severe pain.   traZODone 100 MG tablet Commonly known as:  DESYREL TAKE 100 MG TABLET BY MOUTH AT BEDTIME   triamterene-hydrochlorothiazide 37.5-25 MG tablet Commonly known as:   MAXZIDE-25 Take 0.5 tablets by mouth daily.   valsartan 160 MG tablet Commonly known as:  DIOVAN take 160 mg tablet by mouth at bedtime      Follow-up Information    Georganna Skeans, MD Follow up.   Specialty:  General Surgery Why:  as scheduled Contact information: [place] [address] [phone]           Signed: Zenovia Jarred 07/12/2016, 7:43 AM

## 2016-07-12 NOTE — Progress Notes (Addendum)
Patient again went off unit and going to cafeteria per patient.  Will monitor whereabouts.  Charge RN was made aware.

## 2016-07-12 NOTE — Progress Notes (Signed)
Discharge home. Home discharge instruction given, no question verbalized. 

## 2016-07-19 ENCOUNTER — Encounter: Payer: Self-pay | Admitting: Neurology

## 2016-07-20 ENCOUNTER — Ambulatory Visit: Payer: Medicare Other | Admitting: Neurology

## 2016-08-11 ENCOUNTER — Other Ambulatory Visit: Payer: Self-pay | Admitting: Acute Care

## 2016-08-11 DIAGNOSIS — F17210 Nicotine dependence, cigarettes, uncomplicated: Secondary | ICD-10-CM

## 2016-08-11 DIAGNOSIS — Z122 Encounter for screening for malignant neoplasm of respiratory organs: Secondary | ICD-10-CM

## 2016-08-15 ENCOUNTER — Encounter: Payer: Medicare Other | Admitting: Acute Care

## 2016-08-15 ENCOUNTER — Ambulatory Visit: Payer: Medicare Other

## 2016-08-23 ENCOUNTER — Telehealth: Payer: Self-pay | Admitting: Acute Care

## 2016-08-24 NOTE — Telephone Encounter (Signed)
Patient is calling for lung screening appointment.

## 2016-08-24 NOTE — Telephone Encounter (Signed)
Patient is calling to reschedule shared decision appointment. [phone]

## 2016-08-24 NOTE — Telephone Encounter (Signed)
Routing to lung nodule pool.  

## 2016-08-26 ENCOUNTER — Telehealth: Payer: Self-pay | Admitting: Acute Care

## 2016-08-26 ENCOUNTER — Other Ambulatory Visit: Payer: Self-pay | Admitting: Physician Assistant

## 2016-08-26 DIAGNOSIS — R110 Nausea: Secondary | ICD-10-CM

## 2016-08-26 DIAGNOSIS — R1310 Dysphagia, unspecified: Secondary | ICD-10-CM

## 2016-08-26 NOTE — Telephone Encounter (Signed)
LMTC x 1  

## 2016-08-26 NOTE — Telephone Encounter (Signed)
Spoke with pt and rescheduled SDMV to 09/02/16 2:00 CT will be rescheduled Pt verbalized understanding Nothing further needed

## 2016-08-26 NOTE — Telephone Encounter (Signed)
Refer to phone message 08/23/16.  Will close this note.

## 2016-08-29 ENCOUNTER — Ambulatory Visit
Admission: RE | Admit: 2016-08-29 | Discharge: 2016-08-29 | Disposition: A | Discharge status: Home / Self Care | Payer: Medicare Other | Source: Ambulatory Visit | Attending: Physician Assistant | Admitting: Physician Assistant

## 2016-08-29 ENCOUNTER — Ambulatory Visit
Admission: RE | Admit: 2016-08-29 | Discharge: 2016-08-29 | Disposition: A | Discharge status: Home / Self Care | Payer: Medicare Other | Source: Ambulatory Visit | Attending: Family Medicine | Admitting: Family Medicine

## 2016-08-29 ENCOUNTER — Other Ambulatory Visit: Payer: Self-pay | Admitting: Family Medicine

## 2016-08-29 DIAGNOSIS — R110 Nausea: Secondary | ICD-10-CM

## 2016-08-29 DIAGNOSIS — J449 Chronic obstructive pulmonary disease, unspecified: Secondary | ICD-10-CM

## 2016-08-29 DIAGNOSIS — R1310 Dysphagia, unspecified: Secondary | ICD-10-CM

## 2016-09-01 ENCOUNTER — Telehealth: Payer: Self-pay | Admitting: Acute Care

## 2016-09-01 NOTE — Telephone Encounter (Signed)
Please refer to referral notes. Will close this message.

## 2016-09-02 ENCOUNTER — Inpatient Hospital Stay: Admission: RE | Admit: 2016-09-02 | Payer: Medicare Other | Source: Ambulatory Visit

## 2016-09-02 ENCOUNTER — Encounter: Payer: Medicare Other | Admitting: Acute Care

## 2016-09-04 ENCOUNTER — Emergency Department (HOSPITAL_COMMUNITY): Payer: Medicare Other

## 2016-09-04 ENCOUNTER — Emergency Department (HOSPITAL_COMMUNITY)
Admission: EM | Admit: 2016-09-04 | Discharge: 2016-09-04 | Disposition: A | Discharge status: Home / Self Care | Payer: Medicare Other | Attending: Emergency Medicine | Admitting: Emergency Medicine

## 2016-09-04 ENCOUNTER — Encounter (HOSPITAL_COMMUNITY): Payer: Self-pay | Admitting: Emergency Medicine

## 2016-09-04 DIAGNOSIS — Z5321 Procedure and treatment not carried out due to patient leaving prior to being seen by health care provider: Secondary | ICD-10-CM | POA: Diagnosis not present

## 2016-09-04 DIAGNOSIS — R42 Dizziness and giddiness: Secondary | ICD-10-CM | POA: Diagnosis not present

## 2016-09-04 LAB — HEPATIC FUNCTION PANEL
ALT: 20 U/L (ref 17–63)
AST: 16 U/L (ref 15–41)
Albumin: 3.5 g/dL (ref 3.5–5.0)
Alkaline Phosphatase: 99 U/L (ref 38–126)
TOTAL PROTEIN: 5.8 g/dL — AB (ref 6.5–8.1)
Total Bilirubin: 0.6 mg/dL (ref 0.3–1.2)

## 2016-09-04 LAB — CBC
HEMATOCRIT: 42.1 % (ref 39.0–52.0)
HEMOGLOBIN: 14 g/dL (ref 13.0–17.0)
MCH: 30.3 pg (ref 26.0–34.0)
MCHC: 33.3 g/dL (ref 30.0–36.0)
MCV: 91.1 fL (ref 78.0–100.0)
Platelets: 180 10*3/uL (ref 150–400)
RBC: 4.62 MIL/uL (ref 4.22–5.81)
RDW: 15.1 % (ref 11.5–15.5)
WBC: 10.5 10*3/uL (ref 4.0–10.5)

## 2016-09-04 LAB — BASIC METABOLIC PANEL
ANION GAP: 7 (ref 5–15)
BUN: 16 mg/dL (ref 6–20)
CALCIUM: 8.6 mg/dL — AB (ref 8.9–10.3)
CO2: 27 mmol/L (ref 22–32)
Chloride: 106 mmol/L (ref 101–111)
Creatinine, Ser: 1 mg/dL (ref 0.61–1.24)
GFR calc Af Amer: >60 mL/min (ref >60)
GFR calc non Af Amer: >60 mL/min (ref >60)
GLUCOSE: 84 mg/dL (ref 65–99)
POTASSIUM: 3.2 mmol/L — AB (ref 3.5–5.1)
Sodium: 140 mmol/L (ref 135–145)

## 2016-09-04 LAB — LIPASE, BLOOD: LIPASE: 30 U/L (ref 11–51)

## 2016-09-04 LAB — I-STAT TROPONIN, ED: Troponin i, poc: 0 ng/mL (ref 0.00–0.08)

## 2016-09-04 LAB — BRAIN NATRIURETIC PEPTIDE: B NATRIURETIC PEPTIDE 5: 142.9 pg/mL — AB (ref 0.0–100.0)

## 2016-09-04 NOTE — ED Notes (Signed)
Pt hx of vertigo, states hes been very dizzy lately. Pt had difficulty walking due to dizziness

## 2016-09-04 NOTE — ED Notes (Signed)
Pt to CT
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
## 2016-09-04 NOTE — ED Triage Notes (Addendum)
Pt reports new onset centralized chest pain, non radiating with shortness of breath since Friday. Also reports dizziness but the dizziness he reports is not new, states he sees a neurologist and was taught tricks for vertigo that "do not work", pt states he tried the trick last night and it made him nauseated.

## 2016-09-04 NOTE — ED Notes (Signed)
Unable to locate patient.

## 2016-10-06 ENCOUNTER — Ambulatory Visit (INDEPENDENT_AMBULATORY_CARE_PROVIDER_SITE_OTHER): Payer: Medicare Other | Admitting: Neurology

## 2016-10-06 ENCOUNTER — Encounter: Payer: Self-pay | Admitting: Neurology

## 2016-10-06 VITALS — BP 138/68 | HR 58

## 2016-10-06 DIAGNOSIS — F17200 Nicotine dependence, unspecified, uncomplicated: Secondary | ICD-10-CM | POA: Diagnosis not present

## 2016-10-06 DIAGNOSIS — G450 Vertebro-basilar artery syndrome: Secondary | ICD-10-CM | POA: Diagnosis not present

## 2016-10-06 DIAGNOSIS — R42 Dizziness and giddiness: Secondary | ICD-10-CM | POA: Diagnosis not present

## 2016-10-06 NOTE — Patient Instructions (Signed)
1.  We will check CTA of head to evaluate for any blood vessel blockage 2.  We discussed importance of quitting smoking

## 2016-10-06 NOTE — Progress Notes (Signed)
NEUROLOGY CONSULTATION NOTE  Rick Church MRN: [id number] DOB: 12-Aug-1940  Referring provider: Dr. Dorthy Cooler Primary care provider: Dr. Sheryn Bison  Reason for consult:  dizziness  HISTORY OF PRESENT ILLNESS: Rick Church is a 76 year old male with COPD, hypertension, CAD status post NSTEMI, arthritis and history of Legionnaire's pneumonia who presents for dizziness.  History supplemented by ED note.  Since January or February 2018, he has not been feeling well.  He reported onset of dizziness.  He has prior history of vertigo, but this was different.  He described this dizziness as feeling "drunk".  There was a sense of movement but not spinning.  It was not positional.  He also endorsed nausea and abdominal discomfort.  He was found to have gallstones.  After they were removed in early July, the dizziness and nausea became worse.  Prior to surgery, they were episodic, where he would have 3 or 4 days of no dizziness at a time.  After the surgery, it was constant.  He has had falls due to unsteadiness.  He sometimes noted sunspots and double vision.  He denied hearing loss but has chronic tinnitus.  He denied slurred speech, facial droop or unilateral numbness or weakness.  Meclizine was ineffective.  Zofran did not help with nausea.  He tried the Epley maneuver, which helped with his previous vertigo, but it was ineffective.  CT of head from 09/04/16 was personally reviewed and revealed signs of cerebrovascular disease but no acute findings or mass lesions.  He takes Lyrica once daily for neuralgia.  About 2 weeks ago, he started taking it 3 times daily (as originally prescribed) and the dizziness and nausea resolved.  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  He has significant stroke risk factors.  He has prior strokes.  He is a smoker.  He has hypertension and CAD.  He takes ASA 81mg  daily.  09/04/16 LABS:   CBC with WBC 10.5, HGB 14, HCT 42.1, PLT 180; BMP with Na 140, K 3.2, Cl 106, CO2 27, glucose 84, BUN 16 and Cr 1; hepatic panel with total bili 0.6, ALP 99, AST 16 and ALT 20.  PAST MEDICAL HISTORY: Past Medical History:  Diagnosis Date  . Acute respiratory distress syndrome (ARDS) (HCC)   . Anxiety   . Aortic stenosis    mild AS 01/2014 echo  . Arthritis   . Complication of anesthesia   . COPD (chronic obstructive pulmonary disease) ([place])   . Ejection fraction   . GERD (gastroesophageal reflux disease)   . Headache   . High cholesterol   . History of hiatal hernia   . Hypertension   . Legionnaire's disease ([place])   . Migraine   . Neck pain   . NSTEMI (non-ST elevated myocardial infarction) ([place])    12/2013; cath non-obstructive CAD  . Panic attack   . Pneumonia   . PONV (postoperative nausea and vomiting)   . 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 BACK SURGERY    . CARPAL TUNNEL RELEASE Bilateral   . CHOLECYSTECTOMY N/A 07/11/2016   Procedure: LAPAROSCOPIC CHOLECYSTECTOMY;  Surgeon: Georganna Skeans, MD;  Location: [facility];  Service: General;  Laterality: N/A;  . FOOT NEUROMA SURGERY Left 09/29/2101  . LEFT HEART CATH AND CORONARY ANGIOGRAPHY N/A 06/07/2016   Procedure: Left Heart Cath and Coronary  Angiography;  Surgeon: Adrian Prows, MD;  Location: [facility];  Service: Cardiovascular;  Laterality: N/A;  . LEFT HEART CATHETERIZATION WITH CORONARY ANGIOGRAM N/A 01/06/2014   Procedure: LEFT HEART CATHETERIZATION WITH CORONARY ANGIOGRAM;  Surgeon: Peter M Martinique, MD;  Location: [facility] CATH LAB;  Service: Cardiovascular;  Laterality: N/A;  . NECK SURGERY    . ROTATOR CUFF REPAIR Left   . SHOULDER SURGERY Right 1998, 2002  . TONSILLECTOMY    . TRACHEOSTOMY     feinstein  . TRACHEOSTOMY CLOSURE      MEDICATIONS: Current Outpatient Prescriptions on File Prior to Visit  Medication Sig Dispense Refill  . acetaminophen  (TYLENOL) 500 MG tablet Take 500 mg by mouth every 6 (six) hours as needed for moderate pain or headache.     Marland Kitchen amLODipine (NORVASC) 10 MG tablet Take 5 mg by mouth at bedtime  1  . aspirin EC 81 MG tablet Take 81 mg by mouth at bedtime.     . butalbital-acetaminophen-caffeine (FIORICET, ESGIC) 50-325-40 MG per tablet Take 1-2 tablets by mouth every 6 (six) hours as needed for migraine. migraines  1  . CAMBIA 50 MG PACK TAKE AS NEEDED FOR SEVERE MIGRAINE 9 each 11  . citalopram (CELEXA) 40 MG tablet Take 40 mg by mouth at bedtime.    . clonazePAM (KLONOPIN) 2 MG tablet Take 1 tablet (2 mg total) by mouth 2 (two) times daily as needed for anxiety. 30 tablet 0  . doxycycline (VIBRA-TABS) 100 MG tablet Take 100 mg by mouth See admin instructions. Take 100 mg by mouth every 36 hours as needed after tick bite  0  . ibuprofen (ADVIL,MOTRIN) 200 MG tablet Take 400 mg by mouth every 6 (six) hours as needed for headache or moderate pain.     Marland Kitchen irbesartan (AVAPRO) 300 MG tablet Take 300 mg by mouth at bedtime.   2  . meclizine (ANTIVERT) 25 MG tablet Take 25 mg by mouth 3 (three) times daily as needed for dizziness.    . Multiple Vitamin (MULTIVITAMIN WITH MINERALS) TABS tablet Take 1 tablet by mouth at bedtime.    . ondansetron (ZOFRAN) 4 MG tablet Take 4 mg by mouth every 8 (eight) hours as needed for nausea.     . pantoprazole (PROTONIX) 40 MG tablet Take 1 tablet (40 mg total) by mouth daily. (Patient taking differently: Take 40 mg by mouth daily as needed (heartburn). ) 30 tablet 0  . pregabalin (LYRICA) 75 MG capsule Take 1 capsule (75 mg total) by mouth 3 (three) times daily. (Patient taking differently: Take 75 mg by mouth 3 (three) times daily as needed (for pain.). ) 90 capsule 0  . promethazine (PHENERGAN) 12.5 MG tablet TAKE 25 MG BY MOUTH EVERY 6 HOURS AS NEEDED FOR DIZZINESS OR SICKNESS  0  . simvastatin (ZOCOR) 80 MG tablet Take 40 mg by mouth at bedtime.    . traMADol (ULTRAM) 50 MG tablet  Take 2 tablets (100 mg total) by mouth every 6 (six) hours as needed for moderate pain or severe pain. (Patient not taking: Reported on 09/04/2016) 30 tablet 0  . traZODone (DESYREL) 100 MG tablet TAKE 100 MG TABLET BY MOUTH AT BEDTIME  6   No current facility-administered medications on file prior to visit.     ALLERGIES: Allergies  Allergen Reactions  . Atorvastatin Other (See Comments)    leg myalgias  . Indocin [Indomethacin] Nausea Only and Other (See Comments)    dizzy  . Penicillins  Hives and Other (See Comments)    Tolerated a dose of CEFEPIME 12/31/13  . Buspirone Other (See Comments)    UNSPECIFIED INTOLERANCE  . Restoril [Temazepam] Other (See Comments)    UNSPECIFIED INTOLERANCE  . Toprol Xl [Metoprolol Tartrate] Other (See Comments)    UNSPECIFIED REACTION    . Asa [Aspirin] Itching and Rash  . Codeine Nausea And Vomiting  . Hytrin [Terazosin] Nausea And Vomiting       . Neurontin [Gabapentin] Nausea And Vomiting       . Oxycodone Nausea Only and Other (See Comments)    "Deathly sick"  . Zestril [Lisinopril] Other (See Comments)    UNSPECIFIED SPECIFIC REACTION  Makes him feel really bad, doesn't feel like getting up in the morning     FAMILY HISTORY: Family History  Problem Relation Age of Onset  . Hypertension Mother   . Brain cancer Mother   . Lung cancer Mother   . Hypertension Brother   . Cervical cancer Sister   . Hypertension Father   . Cerebral aneurysm Father     SOCIAL HISTORY: Social History   Social History  . Marital status: Widowed    Spouse name: N/A  . Number of children: 0  . Years of education: GED   Occupational History  . Disabled    Social History Main Topics  . Smoking status: Current Some Day Smoker    Packs/day: 1.50    Types: Cigarettes    Start date: 11/14/1956    Last attempt to quit: 11/08/2013  . Smokeless tobacco: Never Used     Comment: smokes 4 cigarettes daily  . Alcohol use No  . Drug use: No  . Sexual  activity: Not on file   Other Topics Concern  . Not on file   Social History Narrative   Lives at home alone.   Right-handed.   Drinks approximately 1 liter of Dr. Malachi Bonds per day.    REVIEW OF SYSTEMS: Constitutional: No fevers, chills, or sweats, no generalized fatigue, change in appetite Eyes: No visual changes, double vision, eye pain Ear, nose and throat: No hearing loss, ear pain, nasal congestion, sore throat Cardiovascular: No chest pain, palpitations Respiratory:  No shortness of breath at rest or with exertion, wheezes GastrointestinaI: No nausea, vomiting, diarrhea, abdominal pain, fecal incontinence Genitourinary:  No dysuria, urinary retention or frequency Musculoskeletal:  No neck pain, back pain Integumentary: No rash, pruritus, skin lesions Neurological: as above Psychiatric: No depression, insomnia, anxiety Endocrine: No palpitations, fatigue, diaphoresis, mood swings, change in appetite, change in weight, increased thirst Hematologic/Lymphatic:  No purpura, petechiae. Allergic/Immunologic: no itchy/runny eyes, nasal congestion, recent allergic reactions, rashes  PHYSICAL EXAM: Vitals:   10/06/16 0825 10/06/16 0828  BP: (!) 142/70 138/68  Pulse: 61 (!) 58  SpO2: 91% 94%   General: No acute distress.  Patient appears well-groomed.  Head:  Normocephalic/atraumatic Eyes:  fundi examined but not visualized Neck: supple, no paraspinal tenderness, full range of motion Back: No paraspinal tenderness Heart: regular rate and rhythm Lungs: Clear to auscultation bilaterally. Vascular: No carotid bruits. Neurological Exam: Mental status: alert and oriented to person, place, and time, recent and remote memory intact, fund of knowledge intact, attention and concentration intact, speech fluent and not dysarthric, language intact. Cranial nerves: CN I: not tested CN II: pupils equal, round and reactive to light, visual fields intact CN III, IV, VI:  full range of  motion, no nystagmus, no ptosis CN V: facial sensation intact CN VII: upper  and lower face symmetric CN VIII: hearing intact CN IX, X: gag intact, uvula midline CN XI: sternocleidomastoid and trapezius muscles intact CN XII: tongue midline Bulk & Tone: normal, no fasciculations. Motor:  5/5 throughout  Sensation: temperature and vibration sensation intact. Deep Tendon Reflexes:  2+ throughout, toes downgoing.  Finger to nose testing:  Without dysmetria.  Heel to shin:  Without dysmetria.  Gait:  Mildly wide-based.  Able to turn and tandem walk with mild stumbling. Romberg negative.  IMPRESSION: 1.  Dizziness.  Not classic BPPV, especially since he reports that it was constant and not positional.  He may have had double vision but no clear lateralizing symptoms to definitely suggest posterior circulation stroke, however he has cerebrovascular disease, so vertebrobasilar insufficiency should be evaluated. 2.  HTN 3.  Cigarette smoker  PLAN: 1.  Recommend CTA of head to evaluate the vertebrobasilar system.  Pending results, such as any significant stenosis, may consider switching from ASA 81mg  daily to Plavix (or ASA 325mg , since he says he easily bleeds, as demonstrated by lesions on his forearms). 2.  Otherwise, I would recommend vestibular rehabilitation. 3.  Blood pressure mildly elevated and should be re-evaluated by PCP 4. Smoking cessation instruction/counseling given:  counseled patient on the dangers of tobacco use, advised patient to stop smoking, and reviewed strategies to maximize success.  Thank you for allowing me to take part in the care of this patient.  Metta Clines, DO  CC:  Aura Dials, MD  [place] Dorthy Cooler, MD

## 2016-10-11 ENCOUNTER — Telehealth: Payer: Self-pay | Admitting: Neurology

## 2016-10-11 ENCOUNTER — Ambulatory Visit
Admission: RE | Admit: 2016-10-11 | Discharge: 2016-10-11 | Disposition: A | Discharge status: Home / Self Care | Payer: Medicare Other | Source: Ambulatory Visit | Attending: Neurology | Admitting: Neurology

## 2016-10-11 DIAGNOSIS — G450 Vertebro-basilar artery syndrome: Secondary | ICD-10-CM

## 2016-10-11 DIAGNOSIS — R42 Dizziness and giddiness: Secondary | ICD-10-CM

## 2016-10-11 MED ORDER — IOPAMIDOL (ISOVUE-370) INJECTION 76%
75.0000 mL | Freq: Once | INTRAVENOUS | Status: AC | PRN
  Administered 2016-10-11: 75 mL via INTRAVENOUS

## 2016-10-11 NOTE — Telephone Encounter (Signed)
Mychart message sent to patient.

## 2016-10-11 NOTE — Telephone Encounter (Signed)
-----   Message from Pieter Partridge, DO sent at 10/11/2016 12:45 PM EDT ----- CT does not show any cause for dizziness

## 2016-10-17 ENCOUNTER — Telehealth: Payer: Self-pay | Admitting: Neurology

## 2016-10-17 NOTE — Telephone Encounter (Signed)
Patient called regarding a CT Scan that he needs rescheduled. Please call. Thanks

## 2016-10-18 NOTE — Telephone Encounter (Signed)
LM for Pt to rtrn my call

## 2016-10-21 ENCOUNTER — Encounter: Payer: Self-pay | Admitting: Neurology

## 2016-10-21 ENCOUNTER — Ambulatory Visit (INDEPENDENT_AMBULATORY_CARE_PROVIDER_SITE_OTHER): Payer: Medicare Other | Admitting: Neurology

## 2016-10-21 VITALS — BP 114/70 | HR 50 | Ht 68.0 in | Wt 174.0 lb

## 2016-10-21 DIAGNOSIS — R51 Headache: Secondary | ICD-10-CM

## 2016-10-21 DIAGNOSIS — R42 Dizziness and giddiness: Secondary | ICD-10-CM | POA: Diagnosis not present

## 2016-10-21 DIAGNOSIS — R519 Headache, unspecified: Secondary | ICD-10-CM

## 2016-10-21 MED ORDER — BUTALBITAL-APAP-CAFFEINE 50-325-40 MG PO TABS: 1.0000 | ORAL_TABLET | Freq: Four times a day (QID) | ORAL | 2 refills | Status: DC | PRN

## 2016-10-21 MED ORDER — GABAPENTIN 300 MG PO CAPS: 300.0000 mg | ORAL_CAPSULE | Freq: Every day | ORAL | 2 refills | Status: DC

## 2016-10-21 NOTE — Progress Notes (Signed)
NEUROLOGY FOLLOW UP OFFICE NOTE  Rick Church [id number]  HISTORY OF PRESENT ILLNESS: Rick Church is a 76 year old male with COPD, hypertension, CAD status post NSTEMI, arthritis and history of Legionnaire's pneumonia who follows up for dizziness.  UPDATE: CTA of head was performed on 10/11/16, which was personally reviewed and revealed no significant intracranial stenosis or occlusion, including the vertebrobasilar system.    He reports ongoing dizziness.  The Lyrica works but he doesn't take it everyday because he wants to hold onto it since it is expensive.  He also talks about his longstanding chronic daily headaches.  He takes Fioricet.   HISTORY: Since January or February 2018, he has not been feeling well.  He reported onset of dizziness.  He has prior history of vertigo, but this was different.  He described this dizziness as feeling "drunk".  There was a sense of movement but not spinning.  It was not positional.  He also endorsed nausea and abdominal discomfort.  He was found to have gallstones.  After they were removed in early July, the dizziness and nausea became worse.  Prior to surgery, they were episodic, where he would have 3 or 4 days of no dizziness at a time.  After the surgery, it was constant.  He has had falls due to unsteadiness.  He sometimes noted sunspots and double vision.  He denied hearing loss but has chronic tinnitus.  He denied slurred speech, facial droop or unilateral numbness or weakness.  Meclizine was ineffective.  Zofran did not help with nausea.  He tried the Epley maneuver, which helped with his previous vertigo, but it was ineffective.  CT of head from 09/04/16 was personally reviewed and revealed signs of cerebrovascular disease but no acute findings or mass lesions.  He takes Lyrica once daily for neuralgia.  In September, he started taking it 3 times daily (as originally prescribed) and the dizziness and nausea resolved.     He has significant stroke  risk factors.  He has prior strokes.  He is a smoker.  He has hypertension and CAD.  He takes ASA 81mg  daily.  PAST MEDICAL HISTORY: Past Medical History:  Diagnosis Date  . Acute respiratory distress syndrome (ARDS) (HCC)   . Anxiety   . Aortic stenosis    mild AS 01/2014 echo  . Arthritis   . Complication of anesthesia   . COPD (chronic obstructive pulmonary disease) ([place])   . Ejection fraction   . GERD (gastroesophageal reflux disease)   . Headache   . High cholesterol   . History of hiatal hernia   . Hypertension   . Legionnaire's disease ([place])   . Migraine   . Neck pain   . NSTEMI (non-ST elevated myocardial infarction) ([place])    12/2013; cath non-obstructive CAD  . Panic attack   . Pneumonia   . PONV (postoperative nausea and vomiting)   . Spondylitis [facility])     MEDICATIONS: Current Outpatient Prescriptions on File Prior to Visit  Medication Sig Dispense Refill  . acetaminophen (TYLENOL) 500 MG tablet Take 500 mg by mouth every 6 (six) hours as needed for moderate pain or headache.     Marland Kitchen amLODipine (NORVASC) 10 MG tablet Take 5 mg by mouth at bedtime  1  . aspirin EC 81 MG tablet Take 81 mg by mouth at bedtime.     Marland Kitchen CAMBIA 50 MG PACK TAKE AS NEEDED FOR SEVERE MIGRAINE 9 each 11  . citalopram (CELEXA) 40  MG tablet Take 40 mg by mouth at bedtime.    . clonazePAM (KLONOPIN) 2 MG tablet Take 1 tablet (2 mg total) by mouth 2 (two) times daily as needed for anxiety. 30 tablet 0  . doxycycline (VIBRA-TABS) 100 MG tablet Take 100 mg by mouth See admin instructions. Take 100 mg by mouth every 36 hours as needed after tick bite  0  . ibuprofen (ADVIL,MOTRIN) 200 MG tablet Take 400 mg by mouth every 6 (six) hours as needed for headache or moderate pain.     Marland Kitchen irbesartan (AVAPRO) 300 MG tablet Take 300 mg by mouth at bedtime.   2  . meclizine (ANTIVERT) 25 MG tablet Take 25 mg by mouth 3 (three) times daily as needed for dizziness.    . Multiple Vitamin (MULTIVITAMIN WITH MINERALS)  TABS tablet Take 1 tablet by mouth at bedtime.    . ondansetron (ZOFRAN) 4 MG tablet Take 4 mg by mouth every 8 (eight) hours as needed for nausea.     . pantoprazole (PROTONIX) 40 MG tablet Take 1 tablet (40 mg total) by mouth daily. (Patient taking differently: Take 40 mg by mouth daily as needed (heartburn). ) 30 tablet 0  . pregabalin (LYRICA) 75 MG capsule Take 1 capsule (75 mg total) by mouth 3 (three) times daily. (Patient taking differently: Take 75 mg by mouth 3 (three) times daily as needed (for pain.). ) 90 capsule 0  . promethazine (PHENERGAN) 12.5 MG tablet TAKE 25 MG BY MOUTH EVERY 6 HOURS AS NEEDED FOR DIZZINESS OR SICKNESS  0  . simvastatin (ZOCOR) 80 MG tablet Take 40 mg by mouth at bedtime.    . traZODone (DESYREL) 100 MG tablet TAKE 100 MG TABLET BY MOUTH AT BEDTIME  6  . traMADol (ULTRAM) 50 MG tablet Take 2 tablets (100 mg total) by mouth every 6 (six) hours as needed for moderate pain or severe pain. (Patient not taking: Reported on 10/21/2016) 30 tablet 0   No current facility-administered medications on file prior to visit.     ALLERGIES: Allergies  Allergen Reactions  . Atorvastatin Other (See Comments)    leg myalgias  . Indocin [Indomethacin] Nausea Only and Other (See Comments)    dizzy  . Penicillins Hives and Other (See Comments)    Tolerated a dose of CEFEPIME 12/31/13  . Buspirone Other (See Comments)    UNSPECIFIED INTOLERANCE  . Restoril [Temazepam] Other (See Comments)    UNSPECIFIED INTOLERANCE  . Toprol Xl [Metoprolol Tartrate] Other (See Comments)    UNSPECIFIED REACTION    . Asa [Aspirin] Itching and Rash  . Codeine Nausea And Vomiting  . Hytrin [Terazosin] Nausea And Vomiting       . Neurontin [Gabapentin] Nausea And Vomiting       . Oxycodone Nausea Only and Other (See Comments)    "Deathly sick"  . Zestril [Lisinopril] Other (See Comments)    UNSPECIFIED SPECIFIC REACTION  Makes him feel really bad, doesn't feel like getting up in the  morning     FAMILY HISTORY: Family History  Problem Relation Age of Onset  . Hypertension Mother   . Brain cancer Mother   . Lung cancer Mother   . Hypertension Brother   . Cervical cancer Sister   . Hypertension Father   . Cerebral aneurysm Father     SOCIAL HISTORY: Social History   Social History  . Marital status: Widowed    Spouse name: N/A  . Number of children: 0  .  Years of education: GED   Occupational History  . Disabled    Social History Main Topics  . Smoking status: Current Some Day Smoker    Packs/day: 1.50    Types: Cigarettes    Start date: 11/14/1956    Last attempt to quit: 11/08/2013  . Smokeless tobacco: Never Used     Comment: smokes 4 cigarettes daily  . Alcohol use No  . Drug use: No  . Sexual activity: Not on file   Other Topics Concern  . Not on file   Social History Narrative   Lives at home alone.   Right-handed.   Drinks approximately 1 liter of Dr. Malachi Bonds per day.    REVIEW OF SYSTEMS: Constitutional: No fevers, chills, or sweats, no generalized fatigue, change in appetite Eyes: No visual changes, double vision, eye pain Ear, nose and throat: No hearing loss, ear pain, nasal congestion, sore throat Cardiovascular: No chest pain, palpitations Respiratory:  No shortness of breath at rest or with exertion, wheezes GastrointestinaI: No nausea, vomiting, diarrhea, abdominal pain, fecal incontinence Genitourinary:  No dysuria, urinary retention or frequency Musculoskeletal:  No neck pain, back pain Integumentary: No rash, pruritus, skin lesions Neurological: as above Psychiatric: No depression, insomnia, anxiety Endocrine: No palpitations, fatigue, diaphoresis, mood swings, change in appetite, change in weight, increased thirst Hematologic/Lymphatic:  No purpura, petechiae. Allergic/Immunologic: no itchy/runny eyes, nasal congestion, recent allergic reactions, rashes  PHYSICAL EXAM: Vitals:   10/21/16 1401  BP: 114/70  Pulse:  (!) 50   General: No acute distress.  Patient appears well-groomed.  Head:  Normocephalic/atraumatic  IMPRESSION: Subjective dizziness/vertigo.  Not classic for BPPV.  He does not demonstrate vertebrobasilar insufficiency.  It is not consistent with vestibular migraine.  Anxiety may be playing a role. Cigarette smoker  PLAN: 1.  For subjective dizziness and headaches, an SSRI or SNRI may be helpful.  Ideally, I would like to switch him from citalopram to venlafaxine.  However, that would require tapering off citalopram first.  He is concerned that his anxiety will increase as he is tapered off of the citalopram.  I then recommended that he take the Lyrica as prescribed, but he again is concerned about the cost.  I then recommended gabapentin 300mg  at bedtime.  He has a reported side effect of nausea and vomiting (which appears to be a side effect to multiple medications) but I asked if he would be willing to retry it again and he agreed but later changed his mind.  We then referred him to vestibular rehab. For acute migraine treatment, will prescribe Fioricet but limited to 8 tablets a month.  He will follow up in 3 months.  Smoking cessation recommended.  26 minutes spent face to face with patient, 100% spent discussing management.  Metta Clines, DO  CC: Aura Dials, MD

## 2016-10-21 NOTE — Patient Instructions (Addendum)
1.  Start gabapentin 300mg  at bedtime 2.  Take the butalbital-acetaminophen-caffeine as needed for migraine every 6 hours.  However, you are only allowed 8 tablets a month. 3.  Follow up in 3 months.

## 2016-10-25 ENCOUNTER — Telehealth: Payer: Self-pay | Admitting: Neurology

## 2016-10-25 NOTE — Telephone Encounter (Signed)
Patient wants to talk to someone about getting a medication called into the drug store. It is a medication that Dr Tomi Likens wanted him to try

## 2016-10-26 NOTE — Telephone Encounter (Signed)
Called and talked with Pt about which of the 3 medications discussed he had decided on, he said promethazine. Advsd him that was an anti-nausea medication and was not mentioned. Pt did mention he did not want to take gabapentin, has had problems with it before. I reminded him if he decides on the venlafaxine, he will need to taper off the citalopram. Pt said he understands that. I talked about Lyrica, he said that did not sound familiar and he was sure there was another medication you mentioned. Do you recall discussing any other medications?

## 2016-10-27 ENCOUNTER — Ambulatory Visit: Payer: Medicare Other | Admitting: Neurology

## 2016-10-27 NOTE — Telephone Encounter (Signed)
LM for Pt to rtrn my call

## 2016-10-27 NOTE — Telephone Encounter (Signed)
No, those were the options

## 2016-10-28 NOTE — Telephone Encounter (Signed)
Rcvd VM from Pt, attempted to reach Pt, Lm on his VM

## 2016-11-02 NOTE — Telephone Encounter (Signed)
Spoke with Pt, he wishes to continue Lyrica after much thought. He will need refills

## 2016-11-03 ENCOUNTER — Other Ambulatory Visit: Payer: Self-pay

## 2016-11-03 MED ORDER — PREGABALIN 75 MG PO CAPS: 75.0000 mg | ORAL_CAPSULE | Freq: Three times a day (TID) | ORAL | 3 refills | Status: DC | PRN

## 2016-11-03 NOTE — Telephone Encounter (Signed)
Okay to refill? 

## 2016-11-03 NOTE — Telephone Encounter (Signed)
Called in Lyrica 75 mg #90 3 refills, 1 TID-PRN for pain, LMOM at 1:02pm

## 2016-11-07 ENCOUNTER — Ambulatory Visit: Payer: Medicare Other | Attending: Neurology | Admitting: Rehabilitative and Restorative Service Providers"

## 2016-11-07 DIAGNOSIS — R42 Dizziness and giddiness: Secondary | ICD-10-CM | POA: Diagnosis not present

## 2016-11-07 DIAGNOSIS — R2689 Other abnormalities of gait and mobility: Secondary | ICD-10-CM | POA: Diagnosis present

## 2016-11-07 DIAGNOSIS — R2681 Unsteadiness on feet: Secondary | ICD-10-CM | POA: Diagnosis present

## 2016-11-07 NOTE — Patient Instructions (Signed)
Gaze Stabilization: Sitting    Keeping eyes on target on wall 3 feet away or held at arm's distance, and move head side to side for _30-60___ seconds.  Do __2-3__ sessions per day.  Copyright  VHI. All rights reserved.   Gaze Stabilization: Tip Card  1.Target must remain in focus, not blurry, and appear stationary while head is in motion. 2.Perform exercises with small head movements (45 to either side of midline). 3.Increase speed of head motion so long as target is in focus. 4.If you wear eyeglasses, be sure you can see target through lens (therapist will give specific instructions for bifocal / progressive lenses). 5.These exercises may provoke dizziness or nausea. Work through these symptoms. If too dizzy, slow head movement slightly. Rest between each exercise. 6.Exercises demand concentration; avoid distractions.  Copyright  VHI. All rights reserved.
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
## 2016-11-14 ENCOUNTER — Encounter: Payer: Self-pay | Admitting: Rehabilitative and Restorative Service Providers"

## 2016-11-14 ENCOUNTER — Ambulatory Visit: Payer: Medicare Other | Attending: Neurology | Admitting: Rehabilitative and Restorative Service Providers"

## 2016-11-14 DIAGNOSIS — R2689 Other abnormalities of gait and mobility: Secondary | ICD-10-CM | POA: Diagnosis present

## 2016-11-14 DIAGNOSIS — R42 Dizziness and giddiness: Secondary | ICD-10-CM | POA: Diagnosis not present

## 2016-11-14 DIAGNOSIS — R2681 Unsteadiness on feet: Secondary | ICD-10-CM | POA: Diagnosis present

## 2016-11-14 NOTE — Patient Instructions (Signed)
Gaze Stabilization - Tip Card  1.Target must remain in focus, not blurry, and appear stationary while head is in motion. 2.Perform exercises with small head movements (45 to either side of midline). 3.Increase speed of head motion so long as target is in focus. 4.If you wear eyeglasses, be sure you can see target through lens (therapist will give specific instructions for bifocal / progressive lenses). 5.These exercises may provoke dizziness or nausea. Work through these symptoms. If too dizzy, slow head movement slightly. Rest between each exercise. 6.Exercises demand concentration; avoid distractions. 7.For safety, perform standing exercises close to a counter, wall, corner, or next to someone.  Copyright  VHI. All rights reserved.   Gaze Stabilization - Standing Feet Apart   Feet shoulder width apart, keeping eyes on target on wall 3 feet away, tilt head down slightly and move head side to side for 30 seconds. Repeat while moving head up and down for 30 seconds. *Work up to tolerating 60 seconds, as able. Do 2-3 sessions per day.   Copyright  VHI. All rights reserved.   Feet Apart (Compliant Surface) Varied Arm Positions - Eyes Closed    Stand on compliant surface: __pillow__ with feet shoulder width apart and arms at your side. Close eyes and visualize upright position. Hold__30__ seconds. Repeat _3___ times per session. Do __2__ sessions per day.  Copyright  VHI. All rights reserved.
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
## 2016-11-23 ENCOUNTER — Ambulatory Visit: Payer: Medicare Other

## 2016-11-23 DIAGNOSIS — R2689 Other abnormalities of gait and mobility: Secondary | ICD-10-CM

## 2016-11-23 DIAGNOSIS — R42 Dizziness and giddiness: Secondary | ICD-10-CM | POA: Diagnosis not present

## 2016-11-23 DIAGNOSIS — R2681 Unsteadiness on feet: Secondary | ICD-10-CM

## 2016-11-23 NOTE — Patient Instructions (Addendum)
Gaze Stabilization - Tip Card  1.Target must remain in focus, not blurry, and appear stationary while head is in motion. 2.Perform exercises with small head movements (45 to either side of midline). 3.Increase speed of head motion so long as target is in focus. 4.If you wear eyeglasses, be sure you can see target through lens (therapist will give specific instructions for bifocal / progressive lenses). 5.These exercises may provoke dizziness or nausea. Work through these symptoms. If too dizzy, slow head movement slightly. Rest between each exercise. 6.Exercises demand concentration; avoid distractions. 7.For safety, perform standing exercises close to a counter, wall, corner, or next to someone.  Copyright  VHI. All rights reserved.   Gaze Stabilization - Standing Feet Apart   Feet shoulder width apart, keeping eyes on target on wall 3 feet away, tilt head down slightly and move head side to side for 30 seconds. Repeat while moving head up and down for 30 seconds. *Work up to tolerating 60 seconds, as able. Do 2-3 sessions per day.   Copyright  VHI. All rights reserved.   Feet Apart (Compliant Surface) Varied Arm Positions - Eyes Closed    Stand on compliant surface: __pillow__ with feet shoulder width apart and arms at your side. Close eyes and visualize upright position. Hold__30__ seconds. Repeat _3___ times per session. Do __2__ sessions per day.  Copyright  VHI. All rights reserved.   Feet Together (Compliant Surface) Head Motion - Eyes Open    With eyes open, standing on compliant surface: ___pillows_____, feet together and hands at your side, move head slowly: up and down 5 times and side to side 5 times. Repeat _3___ times per session. Do __1__ sessions per day.  Copyright  VHI. All rights reserved.
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
## 2016-11-28 ENCOUNTER — Ambulatory Visit: Payer: Medicare Other

## 2016-11-28 DIAGNOSIS — R42 Dizziness and giddiness: Secondary | ICD-10-CM

## 2016-11-28 DIAGNOSIS — R2689 Other abnormalities of gait and mobility: Secondary | ICD-10-CM

## 2016-11-28 DIAGNOSIS — R2681 Unsteadiness on feet: Secondary | ICD-10-CM

## 2016-11-28 NOTE — Patient Instructions (Addendum)
Gaze Stabilization - Tip Card  1.Target must remain in focus, not blurry, and appear stationary while head is in motion. 2.Perform exercises with small head movements (45 to either side of midline). 3.Increase speed of head motion so long as target is in focus. 4.If you wear eyeglasses, be sure you can see target through lens (therapist will give specific instructions for bifocal / progressive lenses). 5.These exercises may provoke dizziness or nausea. Work through these symptoms. If too dizzy, slow head movement slightly. Rest between each exercise. 6.Exercises demand concentration; avoid distractions. 7.For safety, perform standing exercises close to a counter, wall, corner, or next to someone.  Copyright  VHI. All rights reserved.  Gaze Stabilization - Standing Feet Apart   Feet shoulder width apart, keeping eyes on target on wall 3 feet away, tilt head down slightly and move head side to side for 30 seconds. Repeat while moving head up and down for 30 seconds. *Work up to tolerating 60 seconds, as able. Do 2-3 sessions per day.   Copyright  VHI. All rights reserved.  Feet Apart (Compliant Surface) Varied Arm Positions - Eyes Closed    Stand on compliant surface: __pillow__ with feet shoulder width apart and arms at your side. Close eyes and visualize upright position. Hold__30__ seconds. Repeat _3___ times per session. Do __2__ sessions per day.  Copyright  VHI. All rights reserved.  Feet Together (Compliant Surface) Head Motion - Eyes Open    With eyes open, standing on compliant surface: ___pillows_____, feet together and hands at your side, move head slowly: up and down 5 times and side to side 5 times. Repeat _3___ times per session. Do __1__ sessions per day.  Copyright  VHI. All rights reserved.    Feet Apart (Compliant Surface) Head Motion - Eyes Closed    Stand on compliant surface: ___pillows/cushions_____ with feet shoulder width apart. Close  eyes and move head slowly, up and down 5 times and side to side 5 times. Repeat __3__ times per session. Do __1__ sessions per day.  Copyright  VHI. All rights reserved.   Up / Down Head Motion    Hold onto counter. Walking on solid surface, move head and eyes toward ceiling for __2__ steps.  Then, move head and eyes toward floor for __2__ steps. Repeat __4__ times per session. Do _1___ sessions per day.   Copyright  VHI. All rights reserved.  Side to Side Head Motion    Perform holding onto counter. Walking on solid surface, turn head and eyes to left for __2__ steps.  Then, turn head and eyes to opposite side for __2__ steps. Repeat sequence __4__ times per session. Do _1___ sessions per day.   Copyright  VHI. All rights reserved.
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
## 2016-12-05 ENCOUNTER — Encounter: Payer: Self-pay | Admitting: Rehabilitative and Restorative Service Providers"

## 2016-12-05 ENCOUNTER — Ambulatory Visit: Payer: Medicare Other | Admitting: Rehabilitative and Restorative Service Providers"

## 2016-12-05 DIAGNOSIS — R2681 Unsteadiness on feet: Secondary | ICD-10-CM

## 2016-12-05 DIAGNOSIS — R2689 Other abnormalities of gait and mobility: Secondary | ICD-10-CM

## 2016-12-05 DIAGNOSIS — R42 Dizziness and giddiness: Secondary | ICD-10-CM | POA: Diagnosis not present
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
## 2016-12-09 ENCOUNTER — Telehealth: Payer: Self-pay | Admitting: Neurology

## 2016-12-09 ENCOUNTER — Ambulatory Visit: Payer: Medicare Other | Admitting: Rehabilitative and Restorative Service Providers"

## 2016-12-09 ENCOUNTER — Other Ambulatory Visit: Payer: Self-pay

## 2016-12-09 DIAGNOSIS — R42 Dizziness and giddiness: Secondary | ICD-10-CM

## 2016-12-09 NOTE — Telephone Encounter (Signed)
Angie called and needs to see if an order can be faxed over for this patient regarding his Vertigo. She said they would like to see him today. His Vertigo has gotten worse. Thanks

## 2016-12-09 NOTE — Progress Notes (Signed)
amb re 

## 2017-01-01 ENCOUNTER — Other Ambulatory Visit: Payer: Self-pay | Admitting: Neurology

## 2017-02-19 ENCOUNTER — Other Ambulatory Visit: Payer: Self-pay | Admitting: Neurology

## 2017-02-21 ENCOUNTER — Ambulatory Visit (INDEPENDENT_AMBULATORY_CARE_PROVIDER_SITE_OTHER): Payer: Medicare Other | Admitting: Neurology

## 2017-02-21 ENCOUNTER — Encounter: Payer: Self-pay | Admitting: Neurology

## 2017-02-21 VITALS — BP 126/62 | HR 54 | Ht 68.0 in | Wt 177.0 lb

## 2017-02-21 DIAGNOSIS — F17200 Nicotine dependence, unspecified, uncomplicated: Secondary | ICD-10-CM | POA: Diagnosis not present

## 2017-02-21 DIAGNOSIS — G43009 Migraine without aura, not intractable, without status migrainosus: Secondary | ICD-10-CM

## 2017-02-21 DIAGNOSIS — R42 Dizziness and giddiness: Secondary | ICD-10-CM | POA: Diagnosis not present

## 2017-02-21 NOTE — Progress Notes (Signed)
NEUROLOGY FOLLOW UP OFFICE NOTE  Rick Church [id number]  HISTORY OF PRESENT ILLNESS: Rick Church is a 77 year old male with COPD, hypertension, CAD status post NSTEMI, arthritis and history of Legionnaire's pneumonia who follows up for dizziness.   UPDATE: He undergone vestibular rehabilitation and started gabapentin 300mg  at bedtime.  Dizziness has resolved.  Migraines only occur twice a week now.  They respond quickly (within 30 minutes) to Fioricet    HISTORY: Since January or February 2018, he has not been feeling well.  He reported onset of dizziness.  He has prior history of vertigo, but this was different.  He described this dizziness as feeling "drunk".  There was a sense of movement but not spinning.  It was not positional.  He also endorsed nausea and abdominal discomfort.  He was found to have gallstones.  After they were removed in early July, the dizziness and nausea became worse.  Prior to surgery, they were episodic, where he would have 3 or 4 days of no dizziness at a time.  After the surgery, it was constant.  He has had falls due to unsteadiness.  He sometimes noted sunspots and double vision.  He denied hearing loss but has chronic tinnitus.  He denied slurred speech, facial droop or unilateral numbness or weakness.  Meclizine was ineffective.  Zofran did not help with nausea.  He tried the Epley maneuver, which helped with his previous vertigo, but it was ineffective.  CT of head from 09/04/16 was personally reviewed and revealed signs of cerebrovascular disease but no acute findings or mass lesions.  He takes Lyrica once daily for neuralgia.  In September 2018, he started taking it 3 times daily (as originally prescribed) and the dizziness and nausea resolved.    CTA of head was performed on 10/11/16, which was personally reviewed and revealed no significant intracranial stenosis or occlusion, including the vertebrobasilar system.     He has significant stroke risk  factors.  He has prior strokes.  He is a smoker.  He has hypertension and CAD.  He takes ASA 81mg  daily.  He also has longstanding history of migraines, described as severe and pounding, beginning from back of neck and involving the front of his head.  They would occur daily.  Stress is a trigger.  Fioricet helps relieve it.  PAST MEDICAL HISTORY: Past Medical History:  Diagnosis Date  . Acute respiratory distress syndrome (ARDS) (HCC)   . Anxiety   . Aortic stenosis    mild AS 01/2014 echo  . Arthritis   . Complication of anesthesia   . COPD (chronic obstructive pulmonary disease) ([place])   . Ejection fraction   . GERD (gastroesophageal reflux disease)   . Headache   . High cholesterol   . History of hiatal hernia   . Hypertension   . Legionnaire's disease ([place])   . Migraine   . Neck pain   . NSTEMI (non-ST elevated myocardial infarction) ([place])    12/2013; cath non-obstructive CAD  . Panic attack   . Pneumonia   . PONV (postoperative nausea and vomiting)   . Spondylitis ([place])     MEDICATIONS: Current Outpatient Medications on File Prior to Visit  Medication Sig Dispense Refill  . acetaminophen (TYLENOL) 500 MG tablet Take 500 mg by mouth every 6 (six) hours as needed for moderate pain or headache.     Marland Kitchen amLODipine (NORVASC) 10 MG tablet Take 5 mg by mouth at bedtime  1  .  aspirin EC 81 MG tablet Take 81 mg by mouth at bedtime.     . butalbital-acetaminophen-caffeine (FIORICET, ESGIC) 50-325-40 MG tablet TAKE ONE CAPSULE BY MOUTH EVERY 6 HOURS AS NEEDED FOR MIGRAINES. 8 tablet 0  . CAMBIA 50 MG PACK TAKE AS NEEDED FOR SEVERE MIGRAINE 9 each 11  . citalopram (CELEXA) 40 MG tablet Take 40 mg by mouth at bedtime.    . clonazePAM (KLONOPIN) 2 MG tablet Take 1 tablet (2 mg total) by mouth 2 (two) times daily as needed for anxiety. 30 tablet 0  . doxycycline (VIBRA-TABS) 100 MG tablet Take 100 mg by mouth See admin instructions. Take 100 mg by mouth every 36 hours as needed after tick  bite  0  . gabapentin (NEURONTIN) 300 MG capsule TAKE 1 CAPSULE(300 MG) BY MOUTH AT BEDTIME 30 capsule 0  . ibuprofen (ADVIL,MOTRIN) 200 MG tablet Take 400 mg by mouth every 6 (six) hours as needed for headache or moderate pain.     Marland Kitchen irbesartan (AVAPRO) 300 MG tablet Take 300 mg by mouth at bedtime.   2  . meclizine (ANTIVERT) 25 MG tablet Take 25 mg by mouth 3 (three) times daily as needed for dizziness.    . Multiple Vitamin (MULTIVITAMIN WITH MINERALS) TABS tablet Take 1 tablet by mouth at bedtime.    . ondansetron (ZOFRAN) 4 MG tablet Take 4 mg by mouth every 8 (eight) hours as needed for nausea.     . pantoprazole (PROTONIX) 40 MG tablet Take 1 tablet (40 mg total) by mouth daily. (Patient taking differently: Take 40 mg by mouth daily as needed (heartburn). ) 30 tablet 0  . pregabalin (LYRICA) 75 MG capsule Take 1 capsule (75 mg total) by mouth 3 (three) times daily as needed (for pain.). 90 capsule 3  . promethazine (PHENERGAN) 12.5 MG tablet TAKE 25 MG BY MOUTH EVERY 6 HOURS AS NEEDED FOR DIZZINESS OR SICKNESS  0  . simvastatin (ZOCOR) 80 MG tablet Take 40 mg by mouth at bedtime.    . traMADol (ULTRAM) 50 MG tablet Take 2 tablets (100 mg total) by mouth every 6 (six) hours as needed for moderate pain or severe pain. (Patient not taking: Reported on 10/21/2016) 30 tablet 0  . traZODone (DESYREL) 100 MG tablet TAKE 100 MG TABLET BY MOUTH AT BEDTIME  6   No current facility-administered medications on file prior to visit.     ALLERGIES: Allergies  Allergen Reactions  . Atorvastatin Other (See Comments)    leg myalgias  . Indocin [Indomethacin] Nausea Only and Other (See Comments)    dizzy  . Penicillins Hives and Other (See Comments)    Tolerated a dose of CEFEPIME 12/31/13  . Buspirone Other (See Comments)    UNSPECIFIED INTOLERANCE  . Restoril [Temazepam] Other (See Comments)    UNSPECIFIED INTOLERANCE  . Toprol Xl [Metoprolol Tartrate] Other (See Comments)    UNSPECIFIED  REACTION    . Asa [Aspirin] Itching and Rash  . Codeine Nausea And Vomiting  . Hytrin [Terazosin] Nausea And Vomiting       . Neurontin [Gabapentin] Nausea And Vomiting       . Oxycodone Nausea Only and Other (See Comments)    "Deathly sick"  . Zestril [Lisinopril] Other (See Comments)    UNSPECIFIED SPECIFIC REACTION  Makes him feel really bad, doesn't feel like getting up in the morning     FAMILY HISTORY: Family History  Problem Relation Age of Onset  . Hypertension Mother   .  Brain cancer Mother   . Lung cancer Mother   . Hypertension Brother   . Cervical cancer Sister   . Hypertension Father   . Cerebral aneurysm Father     SOCIAL HISTORY: Social History   Socioeconomic History  . Marital status: Widowed    Spouse name: Not on file  . Number of children: 0  . Years of education: GED  . Highest education level: Not on file  Social Needs  . Financial resource strain: Not on file  . Food insecurity - worry: Not on file  . Food insecurity - inability: Not on file  . Transportation needs - medical: Not on file  . Transportation needs - non-medical: Not on file  Occupational History  . Occupation: Disabled  Tobacco Use  . Smoking status: Current Some Day Smoker    Packs/day: 1.50    Types: Cigarettes    Start date: 11/14/1956    Last attempt to quit: 11/08/2013    Years since quitting: 3.2  . Smokeless tobacco: Never Used  . Tobacco comment: smokes 4 cigarettes daily  Substance and Sexual Activity  . Alcohol use: No    Alcohol/week: 0.0 oz  . Drug use: No  . Sexual activity: Not on file  Other Topics Concern  . Not on file  Social History Narrative   Lives at home alone.   Right-handed.   Drinks approximately 1 liter of Dr. Malachi Bonds per day.    REVIEW OF SYSTEMS: Constitutional: No fevers, chills, or sweats, no generalized fatigue, change in appetite Eyes: No visual changes, double vision, eye pain Ear, nose and throat: No hearing loss, ear pain,  nasal congestion, sore throat Cardiovascular: No chest pain, palpitations Respiratory:  No shortness of breath at rest or with exertion, wheezes GastrointestinaI: No nausea, vomiting, diarrhea, abdominal pain, fecal incontinence Genitourinary:  No dysuria, urinary retention or frequency Musculoskeletal:  No neck pain, back pain Integumentary: No rash, pruritus, skin lesions Neurological: as above Psychiatric: No depression, insomnia, anxiety Endocrine: No palpitations, fatigue, diaphoresis, mood swings, change in appetite, change in weight, increased thirst Hematologic/Lymphatic:  No purpura, petechiae. Allergic/Immunologic: no itchy/runny eyes, nasal congestion, recent allergic reactions, rashes  PHYSICAL EXAM: Vitals:   02/21/17 1105  BP: 126/62  Pulse: (!) 54  SpO2: (!) 88%   General: No acute distress.  Patient appears well-groomed.   Head:  Normocephalic/atraumatic Eyes:  Fundi examined but not visualized Neck: supple, no paraspinal tenderness, full range of motion Heart:  Regular rate and rhythm Lungs:  Clear to auscultation bilaterally Back: No paraspinal tenderness Neurological Exam: alert and oriented to person, place, and time. Attention span and concentration intact, recent and remote memory intact, fund of knowledge intact.  Speech fluent and not dysarthric, language intact.  CN II-XII intact. Bulk and tone normal, muscle strength 5/5 throughout.  Sensation to light touch  intact.  Deep tendon reflexes 2+ throughout.  Finger to nose testing intact.  Gait normal  IMPRESSION: Dizziness Migraines  PLAN: Continue gabapentin 300mg  at bedtime Fioricet limited to 8 tablets a month Smoking cessation Follow up in 6 months.  Metta Clines, DO  CC:  Aura Dials, MD

## 2017-02-21 NOTE — Patient Instructions (Signed)
Continue gabapentin 300mg  at bedtime Use butalbital for acute headaches Follow up in 6 months.

## 2017-03-16 ENCOUNTER — Other Ambulatory Visit: Payer: Self-pay | Admitting: Neurology

## 2017-04-13 ENCOUNTER — Other Ambulatory Visit: Payer: Self-pay | Admitting: Neurology

## 2017-05-03 ENCOUNTER — Other Ambulatory Visit: Payer: Self-pay

## 2017-05-03 MED ORDER — GABAPENTIN 300 MG PO CAPS: 300.0000 mg | ORAL_CAPSULE | Freq: Every day | ORAL | 3 refills | Status: DC

## 2017-05-04 ENCOUNTER — Encounter: Payer: Self-pay | Admitting: Neurology

## 2017-05-15 ENCOUNTER — Other Ambulatory Visit: Payer: Self-pay | Admitting: Neurology

## 2017-06-15 ENCOUNTER — Telehealth: Payer: Self-pay | Admitting: Neurology

## 2017-06-15 NOTE — Telephone Encounter (Signed)
Called Pt, his VM is not set up.

## 2017-06-15 NOTE — Telephone Encounter (Signed)
He uses the [facility] on [address] and [address]

## 2017-06-15 NOTE — Telephone Encounter (Signed)
Patient lost his medication that he just had refilled on the 31 the of May. The medication was lyrica . He called the pharmacy and they told him to call our office to speak to someone about this

## 2017-06-19 NOTE — Telephone Encounter (Signed)
Attempted to reach Pt again, went straight to message "VM box not set up"

## 2017-06-20 ENCOUNTER — Telehealth: Payer: Self-pay

## 2017-06-20 ENCOUNTER — Other Ambulatory Visit: Payer: Self-pay | Admitting: Neurology

## 2017-06-20 NOTE — Telephone Encounter (Signed)
Called and spoke with Pt, he found Rx, he forgot to call and let us know.

## 2017-06-20 NOTE — Telephone Encounter (Signed)
Pt LM on my VM, stating feels nauseous a few days a week. He would like something for nausea.

## 2017-07-03 ENCOUNTER — Other Ambulatory Visit: Payer: Self-pay | Admitting: Neurology

## 2017-07-04 ENCOUNTER — Other Ambulatory Visit: Payer: Self-pay

## 2017-07-04 NOTE — Telephone Encounter (Signed)
Rcvd call from Irwin at [facility]. Pt is on both Lyrica and gabapentin, she is questioning if he should be. I advised her he is taking the Lyrica TID PRN for pain, looks like he is taking 300 mg of gabapentin at bedtime.

## 2017-07-24 ENCOUNTER — Other Ambulatory Visit: Payer: Self-pay | Admitting: Neurology

## 2017-08-02 ENCOUNTER — Telehealth: Payer: Self-pay | Admitting: Neurology

## 2017-08-02 NOTE — Telephone Encounter (Signed)
Patient states that the gabapentin is making him sick please call he wants to know if he can try something different

## 2017-08-02 NOTE — Telephone Encounter (Signed)
Called and spoke with Pt, Per Dr Tomi Likens, stop gabapentin. Pt will call back on Monday to let us know how he is doing.

## 2017-08-18 NOTE — Progress Notes (Signed)
NEUROLOGY FOLLOW UP OFFICE NOTE  Rick Church [id number]  HISTORY OF PRESENT ILLNESS: Rick Church is a 77 year old male with COPD, HTN, CAD s/p NSTEMI, arthritis and history of Legionnaire's pneumonia who follows up for dizziness.    UPDATE: He had been doing well on gabapentin 300mg  in the morning.  However, 2 months ago he started getting nauseous again.  He started experiencing increased panic attacks.  He started treating the nausea with Zofran in addition to the clonazepam and Lyrica.  He feels fine.  Headaches have been mild, lasting a couple of hours and occurs 2 or 3 days a month.  He treats with Fioricet.   HISTORY: Since January or February 2018, he has not been feeling well.  He reported onset of dizziness.  He has prior history of vertigo, but this was different.  He described this dizziness as feeling "drunk".  There was a sense of movement but not spinning.  It was not positional.  He also endorsed nausea and abdominal discomfort.  He was found to have gallstones.  After they were removed in early July, the dizziness and nausea became worse.  Prior to surgery, they were episodic, where he would have 3 or 4 days of no dizziness at a time.  After the surgery, it was constant.  He has had falls due to unsteadiness.  He sometimes noted sunspots and double vision.  He denied hearing loss but has chronic tinnitus.  He denied slurred speech, facial droop or unilateral numbness or weakness.  Meclizine was ineffective.  Zofran did not help with nausea.  He tried the Epley maneuver, which helped with his previous vertigo, but it was ineffective.  CT of head from 09/04/16 was personally reviewed and revealed signs of cerebrovascular disease but no acute findings or mass lesions.  He takes Lyrica once daily for neuralgia.  In September 2018, he started taking it 3 times daily (as originally prescribed) and the dizziness and nausea resolved.  It subsequently returned.  He later was started on  gabapentin 300mg  at bedtime and underwent vestibular rehab and dizziness again resolved.    CTA of head was performed on 10/11/16, which was personally reviewed and revealed no significant intracranial stenosis or occlusion, including the vertebrobasilar system.    He has significant stroke risk factors.  He has prior strokes.  He is a smoker.  He has hypertension and CAD.  He takes ASA 81mg  daily.  He also has longstanding history of migraines since the early 1960s, described as severe and pounding, beginning from back of neck and involving the front of his head.  They would occur daily.  Stress is a trigger.  Fioricet helps relieve it.  Past medication includes nortriptyline.  Cambia was effective in the past.  PAST MEDICAL HISTORY: Past Medical History:  Diagnosis Date  . Acute respiratory distress syndrome (ARDS) ([facility])   . Anxiety   . Aortic stenosis    mild AS 01/2014 echo  . Arthritis   . Complication of anesthesia   . COPD (chronic obstructive pulmonary disease) ([place])   . Ejection fraction   . GERD (gastroesophageal reflux disease)   . Headache   . High cholesterol   . History of hiatal hernia   . Hypertension   . Legionnaire's disease ([place])   . Migraine   . Neck pain   . NSTEMI (non-ST elevated myocardial infarction) ([place])    12/2013; cath non-obstructive CAD  . Panic attack   . Pneumonia   .  PONV (postoperative nausea and vomiting)   . 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 acetaminophen (TYLENOL) 500 MG tablet Take 500 mg by mouth every 6 (six) hours as needed for moderate pain or headache.     Marland Kitchen amLODipine (NORVASC) 10 MG tablet Take 5 mg by mouth at bedtime  1  . aspirin EC 81 MG tablet Take 81 mg by mouth at bedtime.     . butalbital-acetaminophen-caffeine (FIORICET, ESGIC) 50-325-40 MG tablet TAKE 1 TABLET BY MOUTH EVERY 6 HOURS AS NEEDED FOR MIGRAINES 8 tablet 0  . CAMBIA 50  MG PACK TAKE AS NEEDED FOR SEVERE MIGRAINE 9 each 11  . citalopram (CELEXA) 40 MG tablet Take 40 mg by mouth at bedtime.    . clonazePAM (KLONOPIN) 2 MG tablet Take 1 tablet (2 mg total) by mouth 2 (two) times daily as needed for anxiety. 30 tablet 0  . doxycycline (VIBRA-TABS) 100 MG tablet Take 100 mg by mouth See admin instructions. Take 100 mg by mouth every 36 hours as needed after tick bite  0  . gabapentin (NEURONTIN) 300 MG capsule TAKE 1 CAPSULE(300 MG) BY MOUTH AT BEDTIME 30 capsule 0  . gabapentin (NEURONTIN) 300 MG capsule Take 1 capsule (300 mg total) by mouth at bedtime. 90 capsule 3  . gabapentin (NEURONTIN) 300 MG capsule TAKE 1 CAPSULE(300 MG) BY MOUTH AT BEDTIME 30 capsule 5  . ibuprofen (ADVIL,MOTRIN) 200 MG tablet Take 400 mg by mouth every 6 (six) hours as needed for headache or moderate pain.     Marland Kitchen irbesartan (AVAPRO) 300 MG tablet Take 300 mg by mouth at bedtime.   2  . LYRICA 75 MG capsule TAKE 1 CAPSULE BY MOUTH THREE TIMES DAILY AS NEEDED FOR PAIN 90 capsule 5  . meclizine (ANTIVERT) 25 MG tablet Take 25 mg by mouth 3 (three) times daily as needed for dizziness.    . Multiple Vitamin (MULTIVITAMIN WITH MINERALS) TABS tablet Take 1 tablet by mouth at bedtime.    . ondansetron (ZOFRAN) 4 MG tablet Take 4 mg by mouth every 8 (eight) hours as needed for nausea.     . pantoprazole (PROTONIX) 40 MG tablet Take 1 tablet (40 mg total) by mouth daily. (Patient taking differently: Take 40 mg by mouth daily as needed (heartburn). ) 30 tablet 0  . promethazine (PHENERGAN) 12.5 MG tablet TAKE 25 MG BY MOUTH EVERY 6 HOURS AS NEEDED FOR DIZZINESS OR SICKNESS  0  . simvastatin (ZOCOR) 80 MG tablet Take 40 mg by mouth at bedtime.    . traMADol (ULTRAM) 50 MG tablet Take 2 tablets (100 mg total) by mouth every 6 (six) hours as needed for moderate pain or severe pain. (Patient not taking: Reported on 10/21/2016) 30 tablet 0  . traZODone (DESYREL) 100 MG tablet TAKE 100 MG TABLET BY MOUTH AT  BEDTIME  6   No current facility-administered medications on file prior to visit.     ALLERGIES: Allergies  Allergen Reactions  . Atorvastatin Other (See Comments)    leg myalgias  . Indocin [Indomethacin] Nausea Only and Other (See Comments)    dizzy  . Penicillins Hives and Other (See Comments)    Tolerated a dose of CEFEPIME 12/31/13  . Buspirone Other (See Comments)    UNSPECIFIED INTOLERANCE  . Restoril [Temazepam] Other (See Comments)    UNSPECIFIED INTOLERANCE  . Toprol Xl [Metoprolol Tartrate] Other (See Comments)  UNSPECIFIED REACTION    . Asa [Aspirin] Itching and Rash  . Codeine Nausea And Vomiting  . Hytrin [Terazosin] Nausea And Vomiting       . Neurontin [Gabapentin] Nausea And Vomiting       . Oxycodone Nausea Only and Other (See Comments)    "Deathly sick"  . Zestril [Lisinopril] Other (See Comments)    UNSPECIFIED SPECIFIC REACTION  Makes him feel really bad, doesn't feel like getting up in the morning     FAMILY HISTORY: Family History  Problem Relation Age of Onset  . Hypertension Mother   . Brain cancer Mother   . Lung cancer Mother   . Hypertension Brother   . Cervical cancer Sister   . Hypertension Father   . Cerebral aneurysm Father     SOCIAL HISTORY: Social History   Socioeconomic History  . Marital status: Widowed    Spouse name: Not on file  . Number of children: 0  . Years of education: GED  . Highest education level: Not on file  Occupational History  . Occupation: Disabled  Social Needs  . Financial resource strain: Not on file  . Food insecurity:    Worry: Not on file    Inability: Not on file  . Transportation needs:    Medical: Not on file    Non-medical: Not on file  Tobacco Use  . Smoking status: Current Some Day Smoker    Packs/day: 1.50    Types: Cigarettes    Start date: 11/14/1956    Last attempt to quit: 11/08/2013    Years since quitting: 3.7  . Smokeless tobacco: Never Used  . Tobacco comment: smokes  4 cigarettes daily  Substance and Sexual Activity  . Alcohol use: No    Alcohol/week: 0.0 standard drinks  . Drug use: No  . Sexual activity: Not on file  Lifestyle  . Physical activity:    Days per week: Not on file    Minutes per session: Not on file  . Stress: Not on file  Relationships  . Social connections:    Talks on phone: Not on file    Gets together: Not on file    Attends religious service: Not on file    Active member of club or organization: Not on file    Attends meetings of clubs or organizations: Not on file    Relationship status: Not on file  . Intimate partner violence:    Fear of current or ex partner: Not on file    Emotionally abused: Not on file    Physically abused: Not on file    Forced sexual activity: Not on file  Other Topics Concern  . Not on file  Social History Narrative   Lives at home alone.   Right-handed.   Drinks approximately 1 liter of Dr. Malachi Bonds per day.    REVIEW OF SYSTEMS: Constitutional: No fevers, chills, or sweats, no generalized fatigue, change in appetite Eyes: No visual changes, double vision, eye pain Ear, nose and throat: No hearing loss, ear pain, nasal congestion, sore throat Cardiovascular: No chest pain, palpitations Respiratory:  No shortness of breath at rest or with exertion, wheezes GastrointestinaI: No nausea, vomiting, diarrhea, abdominal pain, fecal incontinence Genitourinary:  No dysuria, urinary retention or frequency Musculoskeletal:  No neck pain, back pain Integumentary: No rash, pruritus, skin lesions Neurological: as above Psychiatric: No depression, insomnia, anxiety Endocrine: No palpitations, fatigue, diaphoresis, mood swings, change in appetite, change in weight, increased thirst Hematologic/Lymphatic:  No  purpura, petechiae. Allergic/Immunologic: no itchy/runny eyes, nasal congestion, recent allergic reactions, rashes  PHYSICAL EXAM: Blood pressure 130/62, pulse (!) 58, height 5\' 8"  (1.727 m),  weight 177 lb (80.3 kg), SpO2 93 %. General: No acute distress.  Patient appears well-groomed.   Head:  Normocephalic/atraumatic Eyes:  Fundi examined but not visualized Neck: supple, no paraspinal tenderness, full range of motion Heart:  Regular rate and rhythm Lungs:  Clear to auscultation bilaterally Back: No paraspinal tenderness Neurological Exam: alert and oriented to person, place, and time. Attention span and concentration intact, recent and remote memory intact, fund of knowledge intact.  Speech fluent and not dysarthric, language intact.  CN II-XII intact. Bulk and tone normal, muscle strength 5/5 throughout.  Sensation to light touch  intact.  Deep tendon reflexes 2+ throughout.  Finger to nose testing intact.  Gait normal, Romberg negative.  IMPRESSION: Dizziness Migraine without aura, without status migrainosus, not intractable  PLAN: 1.  Continue gabapentin 300mg  daily 2.  Fioricet for abortive therapy 3.  Discussed risk of rebound headache 4.  Zofran for nausea.  5.  Follow up in 6 months.  Metta Clines, DO  CC:  Aura Dials, MD

## 2017-08-21 ENCOUNTER — Other Ambulatory Visit: Payer: Self-pay

## 2017-08-21 ENCOUNTER — Ambulatory Visit (INDEPENDENT_AMBULATORY_CARE_PROVIDER_SITE_OTHER): Payer: Medicare Other | Admitting: Neurology

## 2017-08-21 ENCOUNTER — Encounter

## 2017-08-21 ENCOUNTER — Encounter: Payer: Self-pay | Admitting: Neurology

## 2017-08-21 VITALS — BP 130/62 | HR 58 | Ht 68.0 in | Wt 177.0 lb

## 2017-08-21 DIAGNOSIS — R42 Dizziness and giddiness: Secondary | ICD-10-CM

## 2017-08-21 DIAGNOSIS — G43009 Migraine without aura, not intractable, without status migrainosus: Secondary | ICD-10-CM

## 2017-08-21 NOTE — Patient Instructions (Signed)
Continue gabapentin 300mg  daily Fioricet as needed.  Limit use

## 2017-08-29 ENCOUNTER — Other Ambulatory Visit: Payer: Self-pay | Admitting: Neurology

## 2017-09-28 ENCOUNTER — Other Ambulatory Visit: Payer: Self-pay | Admitting: Neurology

## 2017-11-07 DIAGNOSIS — R42 Dizziness and giddiness: Secondary | ICD-10-CM | POA: Insufficient documentation

## 2017-11-13 ENCOUNTER — Other Ambulatory Visit: Payer: Self-pay | Admitting: Gastroenterology

## 2017-12-12 ENCOUNTER — Other Ambulatory Visit: Payer: Self-pay

## 2017-12-12 ENCOUNTER — Encounter (HOSPITAL_COMMUNITY): Payer: Self-pay | Admitting: *Deleted

## 2017-12-13 ENCOUNTER — Observation Stay (HOSPITAL_COMMUNITY): Payer: Medicare Other

## 2017-12-13 ENCOUNTER — Encounter (HOSPITAL_COMMUNITY): Payer: Self-pay | Admitting: Anesthesiology

## 2017-12-13 ENCOUNTER — Encounter (HOSPITAL_COMMUNITY)
Admission: RE | Disposition: A | Discharge status: Home / Self Care | Payer: Self-pay | Source: Ambulatory Visit | Attending: Gastroenterology

## 2017-12-13 ENCOUNTER — Ambulatory Visit (HOSPITAL_COMMUNITY): Payer: Medicare Other | Admitting: Anesthesiology

## 2017-12-13 ENCOUNTER — Observation Stay (HOSPITAL_COMMUNITY)
Admission: RE | Admit: 2017-12-13 | Discharge: 2017-12-13 | Discharge status: Against Medical Advice | Payer: Medicare Other | Source: Ambulatory Visit | Attending: Internal Medicine | Admitting: Internal Medicine

## 2017-12-13 DIAGNOSIS — D123 Benign neoplasm of transverse colon: Secondary | ICD-10-CM | POA: Insufficient documentation

## 2017-12-13 DIAGNOSIS — F419 Anxiety disorder, unspecified: Secondary | ICD-10-CM | POA: Insufficient documentation

## 2017-12-13 DIAGNOSIS — Z89022 Acquired absence of left finger(s): Secondary | ICD-10-CM | POA: Insufficient documentation

## 2017-12-13 DIAGNOSIS — D120 Benign neoplasm of cecum: Secondary | ICD-10-CM | POA: Insufficient documentation

## 2017-12-13 DIAGNOSIS — J449 Chronic obstructive pulmonary disease, unspecified: Secondary | ICD-10-CM | POA: Diagnosis not present

## 2017-12-13 DIAGNOSIS — D122 Benign neoplasm of ascending colon: Principal | ICD-10-CM | POA: Insufficient documentation

## 2017-12-13 DIAGNOSIS — R05 Cough: Secondary | ICD-10-CM | POA: Insufficient documentation

## 2017-12-13 DIAGNOSIS — I10 Essential (primary) hypertension: Secondary | ICD-10-CM | POA: Diagnosis not present

## 2017-12-13 DIAGNOSIS — I110 Hypertensive heart disease with heart failure: Secondary | ICD-10-CM | POA: Diagnosis not present

## 2017-12-13 DIAGNOSIS — D124 Benign neoplasm of descending colon: Secondary | ICD-10-CM | POA: Insufficient documentation

## 2017-12-13 DIAGNOSIS — I2510 Atherosclerotic heart disease of native coronary artery without angina pectoris: Secondary | ICD-10-CM | POA: Diagnosis not present

## 2017-12-13 DIAGNOSIS — K621 Rectal polyp: Secondary | ICD-10-CM | POA: Insufficient documentation

## 2017-12-13 DIAGNOSIS — Z79899 Other long term (current) drug therapy: Secondary | ICD-10-CM | POA: Diagnosis not present

## 2017-12-13 DIAGNOSIS — Z791 Long term (current) use of non-steroidal anti-inflammatories (NSAID): Secondary | ICD-10-CM | POA: Insufficient documentation

## 2017-12-13 DIAGNOSIS — K635 Polyp of colon: Secondary | ICD-10-CM | POA: Diagnosis not present

## 2017-12-13 DIAGNOSIS — I252 Old myocardial infarction: Secondary | ICD-10-CM | POA: Insufficient documentation

## 2017-12-13 DIAGNOSIS — Z7982 Long term (current) use of aspirin: Secondary | ICD-10-CM | POA: Insufficient documentation

## 2017-12-13 DIAGNOSIS — I5030 Unspecified diastolic (congestive) heart failure: Secondary | ICD-10-CM | POA: Diagnosis not present

## 2017-12-13 DIAGNOSIS — F17210 Nicotine dependence, cigarettes, uncomplicated: Secondary | ICD-10-CM | POA: Diagnosis not present

## 2017-12-13 DIAGNOSIS — F329 Major depressive disorder, single episode, unspecified: Secondary | ICD-10-CM | POA: Insufficient documentation

## 2017-12-13 DIAGNOSIS — R1110 Vomiting, unspecified: Secondary | ICD-10-CM | POA: Diagnosis not present

## 2017-12-13 DIAGNOSIS — K5730 Diverticulosis of large intestine without perforation or abscess without bleeding: Secondary | ICD-10-CM | POA: Diagnosis not present

## 2017-12-13 DIAGNOSIS — R195 Other fecal abnormalities: Secondary | ICD-10-CM | POA: Diagnosis present

## 2017-12-13 DIAGNOSIS — T17908A Unspecified foreign body in respiratory tract, part unspecified causing other injury, initial encounter: Secondary | ICD-10-CM | POA: Diagnosis present

## 2017-12-13 DIAGNOSIS — K219 Gastro-esophageal reflux disease without esophagitis: Secondary | ICD-10-CM | POA: Insufficient documentation

## 2017-12-13 DIAGNOSIS — E7800 Pure hypercholesterolemia, unspecified: Secondary | ICD-10-CM | POA: Diagnosis not present

## 2017-12-13 DIAGNOSIS — I714 Abdominal aortic aneurysm, without rupture: Secondary | ICD-10-CM | POA: Insufficient documentation

## 2017-12-13 HISTORY — DX: Cerebral infarction, unspecified: I63.9

## 2017-12-13 HISTORY — PX: POLYPECTOMY: SHX5525

## 2017-12-13 HISTORY — PX: BIOPSY: SHX5522

## 2017-12-13 HISTORY — PX: COLONOSCOPY WITH PROPOFOL: SHX5780

## 2017-12-13 SURGERY — COLONOSCOPY WITH PROPOFOL
Anesthesia: Monitor Anesthesia Care

## 2017-12-13 MED ORDER — LIDOCAINE 2% (20 MG/ML) 5 ML SYRINGE
INTRAMUSCULAR | Status: DC | PRN
  Administered 2017-12-13: 60 mg via INTRAVENOUS

## 2017-12-13 MED ORDER — EPHEDRINE SULFATE-NACL 50-0.9 MG/10ML-% IV SOSY
PREFILLED_SYRINGE | INTRAVENOUS | Status: DC | PRN
  Administered 2017-12-13 (×2): 5 mg via INTRAVENOUS

## 2017-12-13 MED ORDER — PROPOFOL 10 MG/ML IV BOLUS
INTRAVENOUS | Status: DC | PRN
  Administered 2017-12-13 (×3): 20 mg via INTRAVENOUS

## 2017-12-13 MED ORDER — LACTATED RINGERS IV SOLN
INTRAVENOUS | Status: DC
  Administered 2017-12-13: 14:00:00 via INTRAVENOUS

## 2017-12-13 MED ORDER — PROPOFOL 10 MG/ML IV BOLUS
INTRAVENOUS | Status: AC
  Filled 2017-12-13: qty 40

## 2017-12-13 MED ORDER — LEVALBUTEROL HCL 0.63 MG/3ML IN NEBU
0.6300 mg | INHALATION_SOLUTION | Freq: Once | RESPIRATORY_TRACT | Status: DC
  Filled 2017-12-13: qty 3

## 2017-12-13 MED ORDER — PROPOFOL 500 MG/50ML IV EMUL
INTRAVENOUS | Status: DC | PRN
  Administered 2017-12-13: 120 ug/kg/min via INTRAVENOUS

## 2017-12-13 MED ORDER — LIDOCAINE HCL (CARDIAC) PF 100 MG/5ML IV SOSY
PREFILLED_SYRINGE | INTRAVENOUS | Status: DC | PRN
  Administered 2017-12-13: 80 mg via INTRAVENOUS

## 2017-12-13 MED ORDER — SODIUM CHLORIDE 0.9 % IV SOLN: INTRAVENOUS | Status: DC

## 2017-12-13 SURGICAL SUPPLY — 22 items

## 2017-12-13 NOTE — Anesthesia Procedure Notes (Signed)
Date/Time: 12/13/2017 1:56 PM Performed by: Sharlette Dense, CRNA Oxygen Delivery Method: Simple face mask

## 2017-12-13 NOTE — Progress Notes (Signed)
I received a call from PACU that patient left AMA.

## 2017-12-13 NOTE — Anesthesia Preprocedure Evaluation (Signed)
Anesthesia Evaluation  Patient identified by MRN, date of birth, ID band Patient awake    Reviewed: Allergy & Precautions, NPO status , Patient's Chart, lab work & pertinent test results  History of Anesthesia Complications (+) PONV and history of anesthetic complications  Airway Mallampati: I  TM Distance: >3 FB Neck ROM: Full    Dental  (+) Edentulous Upper, Edentulous Lower, Dental Advisory Given   Pulmonary COPD, Current Smoker,     + decreased breath sounds      Cardiovascular hypertension, + CAD and + Past MI  negative cardio ROS Normal cardiovascular exam+ Valvular Problems/Murmurs AS   Coronary angiogram 06/07/2016: Normal LVEF, 55-60%. Severely ectatic proximal RCA with ulceration and contrast hang-up, which cleared with intracoronary nitroglycerin. Slow flow is evident in the right coronary artery, abnormal stress test probably due to endothelial dysfunction in the RCA distribution. PL branch has a 50% stenosis. Severely ectatic proximal circumflex with slow flow. A small tiny OM branch is occluded chronically. Mild diffuse disease. Long segment proximal to mid LAD diffuse luminal irregularity with mild ectasia, apical LAD shows a 70-80% stenosis..   Neuro/Psych PSYCHIATRIC DISORDERS Anxiety negative neurological ROS     GI/Hepatic Neg liver ROS, hiatal hernia, GERD  ,  Endo/Other  negative endocrine ROS  Renal/GU negative Renal ROS     Musculoskeletal negative musculoskeletal ROS (+)   Abdominal   Peds  Hematology negative hematology ROS (+)   Anesthesia Other Findings   Reproductive/Obstetrics                             Anesthesia Physical  Anesthesia Plan  ASA: III  Anesthesia Plan: MAC   Post-op Pain Management:    Induction: Intravenous  PONV Risk Score and Plan: Treatment may vary due to age or medical condition  Airway Management Planned: Nasal Cannula and Simple  Face Mask  Additional Equipment:   Intra-op Plan:   Post-operative Plan:   Informed Consent: I have reviewed the patients History and Physical, chart, labs and discussed the procedure including the risks, benefits and alternatives for the proposed anesthesia with the patient or authorized representative who has indicated his/her understanding and acceptance.   Dental advisory given  Plan Discussed with: CRNA, Anesthesiologist and Surgeon  Anesthesia Plan Comments:         Anesthesia Quick Evaluation

## 2017-12-13 NOTE — Progress Notes (Signed)
PACU note; as soon as Dr. Penelope Coop walked away from patient, pt began cursing and removing heart monitors and his patient ID bracelet stating "I'm not staying in this damn place overnight, I'm going home, get me the hell out of here!"  Attempted to calm patient and talk with him but he became more angry and continued to remove monitors.  Dr. Marcell Barlow notified and came in to talk with patient.  He explained to patient that he vomited at the end of the procedure  and could develop serious lung problems later.  Pt continued to curse and state he was not staying in the hospital.  Pt tried to pull at IV and nurse did remove IV and place gauze dressing.  Pt assisted to put clothes on despite pushing nurses away and stating he needed "no Damn help".  Pt refused ride in wheelchair and stated he had to go find his brother to drive him home before " he left the hell out of here". Pt refused to sign any discharge papers or any AMA forms.  Dr. Penelope Coop was called during this event and made aware of pt leaving the hospital and refusing all treatments and testing.

## 2017-12-13 NOTE — H&P (Signed)
Patient here for colonoscopy due to a positive cologaurd test. No change in hx.  PE: no distress Heart RRR, Lungs clear Abdomen non tender  IMP: positive cologaurd. Plan: Colonoscopy

## 2017-12-13 NOTE — Progress Notes (Signed)
Pt left hospital AMA without signing any forms; he walked out to lobby ( nurse was present) to meet brother; at hospital entrance he got in shuttle [place] with his brother to go to car in parking lot; brother stated he was driving vehicle. Dr. Marcell Barlow, anesthesiologist, and Dr. Penelope Coop, GI doctor, both aware of patient leaving hospital

## 2017-12-13 NOTE — Transfer of Care (Signed)
Immediate Anesthesia Transfer of Care Note  Patient: Rick Church  Procedure(s) Performed: Procedure(s): COLONOSCOPY WITH PROPOFOL (N/A) BIOPSY POLYPECTOMY  Patient Location: PACU  Anesthesia Type:MAC  Level of Consciousness:  sedated, patient cooperative and responds to stimulation  Airway & Oxygen Therapy:Patient Spontanous Breathing and Patient connected to face mask oxgen  Post-op Assessment:  Report given to PACU RN and Post -op Vital signs reviewed and stable  Post vital signs:  Reviewed and stable  Last Vitals:  Vitals:   12/13/17 1306 12/13/17 1524  BP: 111/62 125/72  Pulse: (!) 58 (!) 57  Resp: 16   Temp: 36.5 C   SpO2: 95% (!) 63%    Complications: No apparent anesthesia complications

## 2017-12-13 NOTE — Progress Notes (Signed)
Dr. Penelope Coop in to talk with pt to discuss events in endoscopy; he explained to patient he would need to stay overnight due to vomiting and possible aspiration after procedure

## 2017-12-13 NOTE — Anesthesia Postprocedure Evaluation (Signed)
Anesthesia Post Note  Patient: Rick Church  Procedure(s) Performed: COLONOSCOPY WITH PROPOFOL (N/A ) BIOPSY POLYPECTOMY     Patient location during evaluation: PACU Anesthesia Type: MAC Level of consciousness: awake and alert Pain management: pain level controlled Vital Signs Assessment: post-procedure vital signs reviewed and stable Respiratory status: spontaneous breathing Cardiovascular status: stable and blood pressure returned to baseline Postop Assessment: no apparent nausea or vomiting Anesthetic complications: yes Anesthetic complication details: anesthesia complicationsComments: Suspected aspiration at end of case.  I have recommended overnight observation, as patient's pulmonary status is very poor at baseline. He is adamant that he will not stay. I have warned him in clear and repetitve language that leaving is a unwise choice and that he could easily die at home tonight if he aspirated, despite the fact that he feels fine now. I fear he will leave AMA, despite warnings of death or grievous injury.    Last Vitals:  Vitals:   12/13/17 1306 12/13/17 1524  BP: 111/62 125/72  Pulse: (!) 58 (!) 57  Resp: 16   Temp: 36.5 C   SpO2: 96% (!) 89%    Last Pain:  Vitals:   12/13/17 1306  TempSrc: Oral  PainSc: 0-No pain                 Montez Hageman

## 2017-12-14 ENCOUNTER — Encounter (HOSPITAL_COMMUNITY): Payer: Self-pay | Admitting: Gastroenterology

## 2017-12-14 NOTE — Op Note (Signed)
[facility] Patient Name: Rick Church Procedure Date: 12/13/2017 MRN: [id number] Attending MD: Wonda Horner , MD Date of Birth: 18-Jan-1940 CSN: [id number] Age: 77 Admit Type: Inpatient Procedure:                Colonoscopy Indications:              Positive Cologuard test Providers:                Wonda Horner, MD, Cleda Daub, RN, William Dalton, Technician Referring MD:              Medicines:                Propofol per Anesthesia Complications:            Vomiting and coughing toward the end of the exam.                            Discussed with anesthesiologit. It was felt that                            given his pulmonary status it would be best to                            admit for observation and watch for any signs or                            symptoms of aspiration. Estimated Blood Loss:      Procedure:                Pre-Anesthesia Assessment:                           - Prior to the procedure, a History and Physical                            was performed, and patient medications and                            allergies were reviewed. The patient's tolerance of                            previous anesthesia was also reviewed. The risks                            and benefits of the procedure and the sedation                            options and risks were discussed with the patient.                            All questions were answered, and informed consent  was obtained. Prior Anticoagulants: The patient has                            taken no previous anticoagulant or antiplatelet                            agents. ASA Grade Assessment: III - A patient with                            severe systemic disease. After reviewing the risks                            and benefits, the patient was deemed in                            satisfactory condition to undergo the procedure.              After obtaining informed consent, the colonoscope                            was passed under direct vision. Throughout the                            procedure, the patient's blood pressure, pulse, and                            oxygen saturations were monitored continuously. The                            PCF-H190DL ([id number] Olympus peds colonoscope was                            introduced through the anus and advanced to the the                            cecum, identified by the ileocecal valve. The                            ileocecal valve was photographed. The colonoscopy                            was somewhat difficult. The patient tolerated the                            procedure. The quality of the bowel preparation was                            adequate. Scope In: 1:59:04 PM Scope Out: 3:01:19 PM Scope Withdrawal Time: 0 hours 46 minutes 57 seconds  Total Procedure Duration: 1 hour 2 minutes 15 seconds  Findings:      The perianal and digital rectal examinations were normal.      A 30 mm polyp was found in the cecum. The polyp was multi-lobulated.       Biopsies were taken with  a cold forceps for histology. I did not feel       that I could remove this endoscpically.      A 4 mm polyp was found in the ascending colon. The polyp was sessile.       The polyp was removed with a cold biopsy forceps. Resection and       retrieval were complete.      Four sessile polyps were found in the transverse colon. The polyps were       5 to 10 mm in size. These polyps were removed with a hot snare.       Resection was complete, but the polyp tissue was only partially       retrieved.      A 3 mm polyp was found in the transverse colon. The polyp was sessile.       The polyp was removed with a cold biopsy forceps. Resection and       retrieval were complete.      Two sessile polyps were found in the descending colon. The polyps were       medium in size. These polyps were  removed with a hot snare. Resection       and retrieval were complete.      A 15 mm polyp was found in the sigmoid colon. The polyp was       pedunculated. This was in an area of diverticulosis and a sharp bend in       the sigmoid and I could not get it in position to snare or biospsy. It       kept flipping out of view.      Two sessile polyps were found in the rectum. The polyps were small in       size. These were not removed..      The patient started to vomit toward the end of the procedure, and I       stopped before I could remove the rectal polyps. Impression:               - One 30 mm polyp in the cecum. Biopsied.                           - One 4 mm polyp in the ascending colon, removed                            with a cold biopsy forceps. Resected and retrieved.                           - Four 5 to 10 mm polyps in the transverse colon,                            removed with a hot snare. Complete resection.                            Partial retrieval.                           - One 3 mm polyp in the transverse colon, removed  with a cold biopsy forceps. Resected and retrieved.                           - Two medium polyps in the descending colon,                            removed with a hot snare. Resected and retrieved.                           - One 15 mm polyp in the sigmoid colon.                           - Two small polyps in the rectum. Moderate Sedation:      . Recommendation:           - I recommended to Observe patient in hospital for                            observation.                           - Resume regular diet.                           - Continue present medications.                           - Repeat colonoscopy Will discuss with patient.                           - Return to my office after discharge from hospital.                           - Avoid anticoagulants for 10 days.                           - Discussed with  [facility] hospitalist for admission                            for observation and they will see for admission.                           I discussed this with the patient and his brother.                            They were in agreement and ok with this admission                            for observation. Procedure Code(s):        --- Professional ---                           [phone], Colonoscopy, flexible; with removal of  tumor(s), polyp(s), or other lesion(s) by snare                            technique                           45380, 59, Colonoscopy, flexible; with biopsy,                            single or multiple Diagnosis Code(s):        --- Professional ---                           D12.0, Benign neoplasm of cecum                           D12.2, Benign neoplasm of ascending colon                           D12.3, Benign neoplasm of transverse colon (hepatic                            flexure or splenic flexure)                           D12.5, Benign neoplasm of sigmoid colon                           D12.4, Benign neoplasm of descending colon                           K62.1, Rectal polyp                           R19.5, Other fecal abnormalities CPT copyright 2018 American Medical Association. All rights reserved. The codes documented in this report are preliminary and upon coder review may  be revised to meet current compliance requirements. Wonda Horner, MD 12/13/2017 4:03:34 PM This report has been signed electronically. Number of Addenda: 0

## 2017-12-15 ENCOUNTER — Other Ambulatory Visit: Payer: Self-pay | Admitting: Neurology

## 2017-12-18 NOTE — Discharge Summary (Signed)
The patient came into the hospital as an outpatient  To endoscopy department for a colonoscopy which he had done.  After the procedure we were concerned that he might have had some aspiration.Rick Church He was taken to PACU and it was felt that putting him in overnight for observation would be appropriate.. He however left AMA.Rick Church

## 2017-12-28 ENCOUNTER — Other Ambulatory Visit: Payer: Self-pay | Admitting: Neurology

## 2018-01-17 ENCOUNTER — Other Ambulatory Visit: Payer: Self-pay | Admitting: Neurology

## 2018-02-21 ENCOUNTER — Ambulatory Visit: Payer: Medicare Other | Admitting: Neurology

## 2018-02-27 ENCOUNTER — Other Ambulatory Visit: Payer: Self-pay | Admitting: Neurology

## 2018-03-09 ENCOUNTER — Telehealth: Payer: Self-pay | Admitting: Gastroenterology

## 2018-03-09 NOTE — Telephone Encounter (Signed)
Thank you for the update. When I return in the mid-portion of the week I will review. GM

## 2018-03-09 NOTE — Telephone Encounter (Signed)
Dr. Rush Landmark, this pt was referred to you by Dr. Penelope Coop for removal of polyp in Sigmoid colon.  Pt had  a colonoscopy at [facility] in 2019; records in [place].    Lab reports will be placed on your desk for review.

## 2018-03-20 NOTE — Telephone Encounter (Signed)
I have had an opportunity to review the chart.  I think it is reasonable to proceed with attempt at removal of the  polyp and if the patient has not had any surgical evaluation for the cecal/AC polyp for an attempt at resection of that as well.  I would like to see the patient in clinic to discuss EMR attempt risks/benefits.   Please proceed with scheduling a clinic visit and look for an EMR 2-hour time slot (>1 hour for last attempt at diagnostic colonoscopy by itself). Thanks.

## 2018-04-10 ENCOUNTER — Emergency Department (HOSPITAL_COMMUNITY): Payer: Medicare Other

## 2018-04-10 ENCOUNTER — Emergency Department (HOSPITAL_COMMUNITY)
Admission: EM | Admit: 2018-04-10 | Discharge: 2018-04-10 | Disposition: A | Discharge status: Home / Self Care | Payer: Medicare Other | Attending: Emergency Medicine | Admitting: Emergency Medicine

## 2018-04-10 ENCOUNTER — Other Ambulatory Visit: Payer: Self-pay | Admitting: Orthopedic Surgery

## 2018-04-10 DIAGNOSIS — W268XXA Contact with other sharp object(s), not elsewhere classified, initial encounter: Secondary | ICD-10-CM | POA: Diagnosis not present

## 2018-04-10 DIAGNOSIS — Y939 Activity, unspecified: Secondary | ICD-10-CM | POA: Insufficient documentation

## 2018-04-10 DIAGNOSIS — I5032 Chronic diastolic (congestive) heart failure: Secondary | ICD-10-CM | POA: Diagnosis not present

## 2018-04-10 DIAGNOSIS — Y929 Unspecified place or not applicable: Secondary | ICD-10-CM | POA: Diagnosis not present

## 2018-04-10 DIAGNOSIS — Z79899 Other long term (current) drug therapy: Secondary | ICD-10-CM | POA: Insufficient documentation

## 2018-04-10 DIAGNOSIS — F17210 Nicotine dependence, cigarettes, uncomplicated: Secondary | ICD-10-CM | POA: Diagnosis not present

## 2018-04-10 DIAGNOSIS — Z23 Encounter for immunization: Secondary | ICD-10-CM | POA: Diagnosis not present

## 2018-04-10 DIAGNOSIS — I110 Hypertensive heart disease with heart failure: Secondary | ICD-10-CM | POA: Diagnosis not present

## 2018-04-10 DIAGNOSIS — Z7982 Long term (current) use of aspirin: Secondary | ICD-10-CM | POA: Insufficient documentation

## 2018-04-10 DIAGNOSIS — S62635B Displaced fracture of distal phalanx of left ring finger, initial encounter for open fracture: Secondary | ICD-10-CM

## 2018-04-10 DIAGNOSIS — S62615A Displaced fracture of proximal phalanx of left ring finger, initial encounter for closed fracture: Secondary | ICD-10-CM | POA: Diagnosis not present

## 2018-04-10 DIAGNOSIS — Y999 Unspecified external cause status: Secondary | ICD-10-CM | POA: Insufficient documentation

## 2018-04-10 DIAGNOSIS — J449 Chronic obstructive pulmonary disease, unspecified: Secondary | ICD-10-CM | POA: Diagnosis not present

## 2018-04-10 LAB — CBC WITH DIFFERENTIAL/PLATELET
Abs Immature Granulocytes: 0.04 10*3/uL (ref 0.00–0.07)
Basophils Absolute: 0.1 10*3/uL (ref 0.0–0.1)
Basophils Relative: 1 %
Eosinophils Absolute: 0.3 10*3/uL (ref 0.0–0.5)
Eosinophils Relative: 2 %
HCT: 46.2 % (ref 39.0–52.0)
Hemoglobin: 14.9 g/dL (ref 13.0–17.0)
Immature Granulocytes: 0 %
Lymphocytes Relative: 25 %
Lymphs Abs: 2.7 10*3/uL (ref 0.7–4.0)
MCH: 29.3 pg (ref 26.0–34.0)
MCHC: 32.3 g/dL (ref 30.0–36.0)
MCV: 90.9 fL (ref 80.0–100.0)
MONO ABS: 0.9 10*3/uL (ref 0.1–1.0)
Monocytes Relative: 9 %
NEUTROS PCT: 63 %
Neutro Abs: 6.9 10*3/uL (ref 1.7–7.7)
Platelets: 199 10*3/uL (ref 150–400)
RBC: 5.08 MIL/uL (ref 4.22–5.81)
RDW: 15.4 % (ref 11.5–15.5)
WBC: 10.8 10*3/uL — ABNORMAL HIGH (ref 4.0–10.5)
nRBC: 0 % (ref 0.0–0.2)

## 2018-04-10 LAB — BASIC METABOLIC PANEL
Anion gap: 6 (ref 5–15)
BUN: 13 mg/dL (ref 8–23)
CO2: 28 mmol/L (ref 22–32)
Calcium: 9.1 mg/dL (ref 8.9–10.3)
Chloride: 105 mmol/L (ref 98–111)
Creatinine, Ser: 1.22 mg/dL (ref 0.61–1.24)
GFR calc Af Amer: >60 mL/min (ref >60)
GFR calc non Af Amer: 57 mL/min — ABNORMAL LOW (ref >60)
Glucose, Bld: 136 mg/dL — ABNORMAL HIGH (ref 70–99)
Potassium: 3.8 mmol/L (ref 3.5–5.1)
Sodium: 139 mmol/L (ref 135–145)

## 2018-04-10 MED ORDER — TETANUS-DIPHTH-ACELL PERTUSSIS 5-2.5-18.5 LF-MCG/0.5 IM SUSP
0.5000 mL | Freq: Once | INTRAMUSCULAR | Status: AC
  Administered 2018-04-10: 0.5 mL via INTRAMUSCULAR
  Filled 2018-04-10: qty 0.5

## 2018-04-10 MED ORDER — LIDOCAINE HCL (PF) 1 % IJ SOLN
30.0000 mL | Freq: Once | INTRAMUSCULAR | Status: AC
  Administered 2018-04-10: 30 mL
  Filled 2018-04-10: qty 30

## 2018-04-10 MED ORDER — TRANEXAMIC ACID 1000 MG/10ML IV SOLN
500.0000 mg | Freq: Once | INTRAVENOUS | Status: DC
  Filled 2018-04-10: qty 10

## 2018-04-10 MED ORDER — CLINDAMYCIN HCL 150 MG PO CAPS: 300.0000 mg | ORAL_CAPSULE | Freq: Three times a day (TID) | ORAL | 0 refills | Status: DC

## 2018-04-10 MED ORDER — CLINDAMYCIN PHOSPHATE 600 MG/50ML IV SOLN
600.0000 mg | Freq: Once | INTRAVENOUS | Status: AC
  Administered 2018-04-10: 600 mg via INTRAVENOUS
  Filled 2018-04-10: qty 50

## 2018-04-10 NOTE — H&P (Signed)
Rick Church is an 78 y.o. male.   CC / Reason for Visit: Left ring finger injury HPI: This patient is a 78 year old LHD retired male who presents for evaluation of a left ring finger injury that occurred yesterday when he was riding a lawnmower, and his finger was injured when he came close to a fence, holding onto a portion of the molar as he was riding close to the fence.  The injury happened about noontime, but he did not go to the emergency department until about midnight.  In the emergency department, the wound was cleansed and provisionally closed at my direction, and he presents for further evaluation.  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  Past Medical History:  Diagnosis Date  . Acute respiratory distress syndrome (ARDS) (HCC)   . Anxiety   . Aortic stenosis    mild AS 01/2014 echo  . Arthritis   . Complication of anesthesia   . COPD (chronic obstructive pulmonary disease) ([place])   . Ejection fraction   . GERD (gastroesophageal reflux disease)   . Headache   . High cholesterol   . History of hiatal hernia   . Hypertension   . Legionnaire's disease ([place])   . Migraine   . Neck pain   . NSTEMI (non-ST elevated myocardial infarction) ([place])    12/2013; cath non-obstructive CAD  . Panic attack   . Pneumonia   . PONV (postoperative nausea and vomiting)   . Spondylitis ([place])   . Stroke ([place])    hx of x 3     Past Surgical History:  Procedure Laterality Date  . BACK SURGERY    . BIOPSY  12/13/2017   Procedure: BIOPSY;  Surgeon: Wonda Horner, MD;  Location: [facility];  Service: Endoscopy;;  . CARPAL TUNNEL RELEASE Bilateral   . CHOLECYSTECTOMY N/A 07/11/2016   Procedure: LAPAROSCOPIC CHOLECYSTECTOMY;  Surgeon: Georganna Skeans, MD;  Location: [facility];  Service: General;  Laterality: N/A;  . COLONOSCOPY WITH PROPOFOL N/A 12/13/2017   Procedure: COLONOSCOPY WITH PROPOFOL;  Surgeon: Wonda Horner,  MD;  Location: [facility];  Service: Endoscopy;  Laterality: N/A;  . FOOT NEUROMA SURGERY Left 09/29/2101  . LEFT HEART CATH AND CORONARY ANGIOGRAPHY N/A 06/07/2016   Procedure: Left Heart Cath and Coronary Angiography;  Surgeon: Adrian Prows, MD;  Location: [facility];  Service: Cardiovascular;  Laterality: N/A;  . LEFT HEART CATHETERIZATION WITH CORONARY ANGIOGRAM N/A 01/06/2014   Procedure: LEFT HEART CATHETERIZATION WITH CORONARY ANGIOGRAM;  Surgeon: Peter M Martinique, MD;  Location[facility] C[facility] LAB;  Service: Cardiovascular;  Laterality: N/A;  . NECK SURGERY    . POLYPECTOMY  12/13/2017   Procedure: POLYPECTOMY;  Surgeon: Wonda Horner, MD;  Location: [facility];  Service: Endoscopy;;  . ROTATOR CUFF REPAIR Left   . SHOULDER SURGERY Right 1998, 2002  . TONSILLECTOMY    . TRACHEOSTOMY     feinstein  . TRACHEOSTOMY CLOSURE      Family History  Problem Relation Age of Onset  . Hypertension Mother   . Brain cancer Mother   . Lung cancer Mother   . Hypertension Brother   . Cervical cancer Sister   . Hypertension Father   . Cerebral aneurysm Father    Social History:  reports that he has been smoking cigarettes. He started smoking about 61 years ago. He has been smoking about 1.50 packs per day. He has quit  using smokeless tobacco.  His smokeless tobacco use included chew. He reports that he does not drink alcohol or use drugs.  Allergies:  Allergies  Allergen Reactions  . Atorvastatin Other (See Comments)    leg myalgias  . Indocin [Indomethacin] Nausea Only and Other (See Comments)    dizzy  . Penicillins Hives and Other (See Comments)    Tolerated a dose of CEFEPIME 12/31/13  . Buspirone Other (See Comments)    UNSPECIFIED INTOLERANCE  . Restoril [Temazepam] Other (See Comments)    UNSPECIFIED INTOLERANCE  . Toprol Xl [Metoprolol Tartrate] Other (See Comments)    UNSPECIFIED REACTION    . Asa [Aspirin] Itching and Rash  . Codeine Nausea And Vomiting  . Hytrin  [Terazosin] Nausea And Vomiting       . Neurontin [Gabapentin] Nausea And Vomiting       . Oxycodone Nausea Only and Other (See Comments)    "Deathly sick"  . Zestril [Lisinopril] Other (See Comments)    UNSPECIFIED SPECIFIC REACTION  Makes him feel really bad, doesn't feel like getting up in the morning     No medications prior to admission.    Results for orders placed or performed during the hospital encounter of 04/10/18 (from the past 48 hour(s))  CBC with Differential/Platelet     Status: Abnormal   Collection Time: 04/10/18 12:59 AM  Result Value Ref Range   WBC 10.8 (H) 4.0 - 10.5 K/uL   RBC 5.08 4.22 - 5.81 MIL/uL   Hemoglobin 14.9 13.0 - 17.0 g/dL   HCT 46.2 39.0 - 52.0 %   MCV 90.9 80.0 - 100.0 fL   MCH 29.3 26.0 - 34.0 pg   MCHC 32.3 30.0 - 36.0 g/dL   RDW 15.4 11.5 - 15.5 %   Platelets 199 150 - 400 K/uL   nRBC 0.0 0.0 - 0.2 %   Neutrophils Relative % 63 %   Neutro Abs 6.9 1.7 - 7.7 K/uL   Lymphocytes Relative 25 %   Lymphs Abs 2.7 0.7 - 4.0 K/uL   Monocytes Relative 9 %   Monocytes Absolute 0.9 0.1 - 1.0 K/uL   Eosinophils Relative 2 %   Eosinophils Absolute 0.3 0.0 - 0.5 K/uL   Basophils Relative 1 %   Basophils Absolute 0.1 0.0 - 0.1 K/uL   Immature Granulocytes 0 %   Abs Immature Granulocytes 0.04 0.00 - 0.07 K/uL    Comment: Performed at [facility], [address], [place], [place] [address]  Basic metabolic panel     Status: Abnormal   Collection Time: 04/10/18 12:59 AM  Result Value Ref Range   Sodium 139 135 - 145 mmol/L   Potassium 3.8 3.5 - 5.1 mmol/L   Chloride 105 98 - 111 mmol/L   CO2 28 22 - 32 mmol/L   Glucose, Bld 136 (H) 70 - 99 mg/dL   BUN 13 8 - 23 mg/dL   Creatinine, Ser 1.22 0.61 - 1.24 mg/dL   Calcium 9.1 8.9 - 10.3 mg/dL   GFR calc non Af Amer 57 (L) >60 mL/min   GFR calc Af Amer >60 >60 mL/min   Anion gap 6 5 - 15    Comment: Performed at [facility] [address] [place], [place] [address]   Dg Finger  Ring Left  Result Date: 04/10/2018 CLINICAL DATA:  Finger laceration with lawnmower. EXAM: LEFT RING FINGER 2+V COMPARISON:  None. FINDINGS: Acute transverse fracture through fourth distal phalanx with 4 mm  bony distraction and angulation. No intra-articular extension. No dislocation. No destructive bony lesions. Soft tissue irregularity without subcutaneous gas or radiopaque foreign bodies. IMPRESSION: 1. Acute displaced open fourth distal phalanx fracture. Electronically Signed   By: Elon Alas M.D.   On: 04/10/2018 01:08    Review of Systems  All other systems reviewed and are negative.   There were no vitals taken for this visit. Physical Exam  Constitutional:  WD, WN, NAD HEENT:  NCAT, EOMI Neuro/Psych:  Alert & oriented to person, place, and time; appropriate mood & affect Lymphatic: No generalized UE edema or lymphadenopathy Extremities / MSK:  Both UE are normal with respect to appearance, ranges of motion, joint stability, muscle strength/tone, sensation, & perfusion except as otherwise noted:  The left index finger has a mature amputation at the level of the PIP joint.  The ring finger tip has a near circumferential laceration that extends from the dorsal ulnar surface, wrapping around the ulnar side, leaving a radial skin bridge intact.  He has reasonable sensibility on the radial side of the tip, not so much the ulnar side.  The distal portion appears viable with appropriate Refill today.  The proximal aspect of the nail appears to lie dorsally, outside of the eponychial fold.  Labs / Xrays:  No radiographic studies obtained today.  Injury x-rays are reviewed, revealing a transverse displaced fracture through the distal shaft of the distal phalanx of the left ring finger  Assessment: Open fracture of left ring finger distal phalanx, essentially incomplete amputation with an intact radial sided bridge  Plan:  I discussed these findings with him.  He did receive a dose of  IV antibiotics in emergency department but has not yet picked up his oral antibiotics.  I stressed that he do this.  I indicated that options include revision amputation versus repair/reconstruction of the tip.  We will plan to proceed on Friday, depending upon how the digit appeared at that time.  He opted to proceed with reconstruction, which I indicated would involve again irrigating the wound, reducing and providing fixation for the fracture, then repairing the soft tissues.  He was prescribed a pain regimen consisting of Tylenol with intermittent Dilaudid as needed (he has been sick in the past with codeine and hydrocodone, but did well with Dilaudid), the digit was again dressed, and operative arrangements made for Friday as an outpatient.  The details of the operative procedure were discussed with the patient.  Questions were invited and answered.  In addition to the goal of the procedure, the risks of the procedure to include but not limited to bleeding; infection; damage to the nerves or blood vessels that could result in bleeding, numbness, weakness, chronic pain, and the need for additional procedures; stiffness; the need for revision surgery; and anesthetic risks were reviewed.  No specific outcome was guaranteed or implied.  Informed consent was obtained.  Jolyn Nap, MD 04/10/2018, 4:10 PM

## 2018-04-10 NOTE — Discharge Instructions (Addendum)
Follow-up with Dr. Grandville Silos in the office at 2 PM on Tuesday, March 31.  Take the antibiotics as prescribed.  Keep it clean and dry.  As we discussed you may still lose this fingertip.  It is very important to see Dr. Grandville Silos to ensure that this heals and the best way possible.  Return to the ED with worsening weakness, numbness, tingling, fever, any other concerns.

## 2018-04-10 NOTE — ED Triage Notes (Signed)
Pt presents with laceration to L ring finger, cut on blade of lawnmower ~ 1500. Bleeding controlled. Tetanus not UTD.

## 2018-04-10 NOTE — ED Notes (Signed)
E-signature not available, verbalized understanding of DC instructions, prescriptions, follow care.

## 2018-04-10 NOTE — Progress Notes (Signed)
Patient with 13+ hour old incomplete L RF tip amputation.  EDP will numb and wash it well, then grossly re-approximate it with a stitch or two, and dress it.  + antibiotics in ED, with PO meds to follow. I will re-eval this afternoon in office to determine how/when best to provide definitive surgical management--either revision amputation vs repair.
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
## 2018-04-12 ENCOUNTER — Encounter (HOSPITAL_BASED_OUTPATIENT_CLINIC_OR_DEPARTMENT_OTHER): Payer: Self-pay | Admitting: *Deleted

## 2018-04-12 ENCOUNTER — Other Ambulatory Visit: Payer: Self-pay

## 2018-04-12 NOTE — Anesthesia Preprocedure Evaluation (Addendum)
Anesthesia Evaluation  Patient identified by MRN, date of birth, ID band Patient awake    Reviewed: Allergy & Precautions, NPO status , Patient's Chart, lab work & pertinent test results  History of Anesthesia Complications (+) PONV  Airway Mallampati: II  TM Distance: >3 FB Neck ROM: Full    Dental no notable dental hx. (+) Teeth Intact   Pulmonary pneumonia, COPD, Current Smoker,    Pulmonary exam normal breath sounds clear to auscultation       Cardiovascular Exercise Tolerance: Good hypertension, Pt. on medications and Pt. on home beta blockers + Past MI and +CHF  Normal cardiovascular exam+ Valvular Problems/Murmurs AS  Rhythm:Regular Rate:Normal  Cath 06/07/16  Coronary angiogram 06/07/2016: Normal LVEF, 55-60%. Severely ectatic proximal RCA with ulceration and contrast hang-up, which cleared with intracoronary nitroglycerin. Slow flow is evident in the right coronary artery, abnormal stress test probably due to endothelial dysfunction in the RCA distribution. PL branch has a 50% stenosis. Severely ectatic proximal circumflex with slow flow. A small tiny OM branch is occluded chronically. Mild diffuse disease. Long segment proximal to mid LAD diffuse luminal irregularity with mild ectasia, apical LAD shows a 70-80% stenosis..  Mild AS   Neuro/Psych  Headaches, CVA    GI/Hepatic Neg liver ROS, GERD  ,  Endo/Other    Renal/GU negative Renal ROS     Musculoskeletal   Abdominal   Peds  Hematology   Anesthesia Other Findings   Reproductive/Obstetrics                           Anesthesia Physical Anesthesia Plan  ASA: III  Anesthesia Plan: MAC   Post-op Pain Management:    Induction: Intravenous  PONV Risk Score and Plan:   Airway Management Planned:   Additional Equipment:   Intra-op Plan:   Post-operative Plan:   Informed Consent:     Dental advisory given  Plan  Discussed with:   Anesthesia Plan Comments: (L. ring finger tip repair under local. Pt does not have a responsible adult to take him home. He strongly desires to proceede with this surgery. The safest option is to do this case under straight local . Both Patient and surgeon agree.)    Anesthesia Quick Evaluation

## 2018-04-13 ENCOUNTER — Ambulatory Visit (HOSPITAL_BASED_OUTPATIENT_CLINIC_OR_DEPARTMENT_OTHER): Payer: Medicare Other | Admitting: Anesthesiology

## 2018-04-13 ENCOUNTER — Encounter (HOSPITAL_BASED_OUTPATIENT_CLINIC_OR_DEPARTMENT_OTHER)
Admission: RE | Disposition: A | Discharge status: Home / Self Care | Payer: Self-pay | Source: Home / Self Care | Attending: Orthopedic Surgery

## 2018-04-13 ENCOUNTER — Encounter (HOSPITAL_BASED_OUTPATIENT_CLINIC_OR_DEPARTMENT_OTHER): Payer: Self-pay

## 2018-04-13 ENCOUNTER — Ambulatory Visit (HOSPITAL_BASED_OUTPATIENT_CLINIC_OR_DEPARTMENT_OTHER)
Admission: RE | Admit: 2018-04-13 | Discharge: 2018-04-13 | Disposition: A | Discharge status: Home / Self Care | Payer: Medicare Other | Attending: Orthopedic Surgery | Admitting: Orthopedic Surgery

## 2018-04-13 DIAGNOSIS — Z8673 Personal history of transient ischemic attack (TIA), and cerebral infarction without residual deficits: Secondary | ICD-10-CM | POA: Diagnosis not present

## 2018-04-13 DIAGNOSIS — E7800 Pure hypercholesterolemia, unspecified: Secondary | ICD-10-CM | POA: Diagnosis not present

## 2018-04-13 DIAGNOSIS — Z86010 Personal history of colonic polyps: Secondary | ICD-10-CM | POA: Insufficient documentation

## 2018-04-13 DIAGNOSIS — Z8249 Family history of ischemic heart disease and other diseases of the circulatory system: Secondary | ICD-10-CM | POA: Insufficient documentation

## 2018-04-13 DIAGNOSIS — I2510 Atherosclerotic heart disease of native coronary artery without angina pectoris: Secondary | ICD-10-CM | POA: Diagnosis not present

## 2018-04-13 DIAGNOSIS — I252 Old myocardial infarction: Secondary | ICD-10-CM | POA: Insufficient documentation

## 2018-04-13 DIAGNOSIS — F419 Anxiety disorder, unspecified: Secondary | ICD-10-CM | POA: Diagnosis not present

## 2018-04-13 DIAGNOSIS — K449 Diaphragmatic hernia without obstruction or gangrene: Secondary | ICD-10-CM | POA: Insufficient documentation

## 2018-04-13 DIAGNOSIS — G43909 Migraine, unspecified, not intractable, without status migrainosus: Secondary | ICD-10-CM | POA: Diagnosis not present

## 2018-04-13 DIAGNOSIS — Z8669 Personal history of other diseases of the nervous system and sense organs: Secondary | ICD-10-CM | POA: Diagnosis not present

## 2018-04-13 DIAGNOSIS — K219 Gastro-esophageal reflux disease without esophagitis: Secondary | ICD-10-CM | POA: Insufficient documentation

## 2018-04-13 DIAGNOSIS — I10 Essential (primary) hypertension: Secondary | ICD-10-CM | POA: Diagnosis not present

## 2018-04-13 DIAGNOSIS — F410 Panic disorder [episodic paroxysmal anxiety] without agoraphobia: Secondary | ICD-10-CM | POA: Insufficient documentation

## 2018-04-13 DIAGNOSIS — Z888 Allergy status to other drugs, medicaments and biological substances status: Secondary | ICD-10-CM | POA: Insufficient documentation

## 2018-04-13 DIAGNOSIS — Z9049 Acquired absence of other specified parts of digestive tract: Secondary | ICD-10-CM | POA: Insufficient documentation

## 2018-04-13 DIAGNOSIS — Z886 Allergy status to analgesic agent status: Secondary | ICD-10-CM | POA: Diagnosis not present

## 2018-04-13 DIAGNOSIS — Z801 Family history of malignant neoplasm of trachea, bronchus and lung: Secondary | ICD-10-CM | POA: Insufficient documentation

## 2018-04-13 DIAGNOSIS — Z8049 Family history of malignant neoplasm of other genital organs: Secondary | ICD-10-CM | POA: Insufficient documentation

## 2018-04-13 DIAGNOSIS — S62635B Displaced fracture of distal phalanx of left ring finger, initial encounter for open fracture: Secondary | ICD-10-CM | POA: Insufficient documentation

## 2018-04-13 DIAGNOSIS — X58XXXA Exposure to other specified factors, initial encounter: Secondary | ICD-10-CM | POA: Insufficient documentation

## 2018-04-13 DIAGNOSIS — M1990 Unspecified osteoarthritis, unspecified site: Secondary | ICD-10-CM | POA: Insufficient documentation

## 2018-04-13 DIAGNOSIS — F17210 Nicotine dependence, cigarettes, uncomplicated: Secondary | ICD-10-CM | POA: Diagnosis not present

## 2018-04-13 DIAGNOSIS — J449 Chronic obstructive pulmonary disease, unspecified: Secondary | ICD-10-CM | POA: Insufficient documentation

## 2018-04-13 DIAGNOSIS — Z808 Family history of malignant neoplasm of other organs or systems: Secondary | ICD-10-CM | POA: Insufficient documentation

## 2018-04-13 DIAGNOSIS — I350 Nonrheumatic aortic (valve) stenosis: Secondary | ICD-10-CM | POA: Diagnosis not present

## 2018-04-13 DIAGNOSIS — Z881 Allergy status to other antibiotic agents status: Secondary | ICD-10-CM | POA: Insufficient documentation

## 2018-04-13 HISTORY — PX: AMPUTATION: SHX166

## 2018-04-13 SURGERY — AMPUTATION DIGIT
Anesthesia: Monitor Anesthesia Care | Laterality: Left

## 2018-04-13 MED ORDER — ONDANSETRON HCL 4 MG/2ML IJ SOLN: 4.0000 mg | Freq: Once | INTRAMUSCULAR | Status: DC | PRN

## 2018-04-13 MED ORDER — CHLORHEXIDINE GLUCONATE 4 % EX LIQD: 60.0000 mL | Freq: Once | CUTANEOUS | Status: DC

## 2018-04-13 MED ORDER — SCOPOLAMINE 1 MG/3DAYS TD PT72: 1.0000 | MEDICATED_PATCH | Freq: Once | TRANSDERMAL | Status: DC | PRN

## 2018-04-13 MED ORDER — FENTANYL CITRATE (PF) 100 MCG/2ML IJ SOLN
INTRAMUSCULAR | Status: AC
  Filled 2018-04-13: qty 2

## 2018-04-13 MED ORDER — LACTATED RINGERS IV SOLN
INTRAVENOUS | Status: DC
  Administered 2018-04-13: 07:00:00 via INTRAVENOUS

## 2018-04-13 MED ORDER — LIDOCAINE HCL (PF) 1 % IJ SOLN
INTRAMUSCULAR | Status: DC | PRN
  Administered 2018-04-13: 5 mL

## 2018-04-13 MED ORDER — POVIDONE-IODINE 10 % EX SWAB: 2.0000 "application " | Freq: Once | CUTANEOUS | Status: DC

## 2018-04-13 MED ORDER — ONDANSETRON HCL 4 MG/2ML IJ SOLN
INTRAMUSCULAR | Status: AC
  Filled 2018-04-13: qty 2

## 2018-04-13 MED ORDER — HYDROMORPHONE HCL 2 MG PO TABS: 2.0000 mg | ORAL_TABLET | Freq: Two times a day (BID) | ORAL | 0 refills | Status: DC | PRN

## 2018-04-13 MED ORDER — ACETAMINOPHEN 500 MG PO TABS
1000.0000 mg | ORAL_TABLET | Freq: Once | ORAL | Status: AC
  Administered 2018-04-13: 1000 mg via ORAL

## 2018-04-13 MED ORDER — BUPIVACAINE HCL (PF) 0.25 % IJ SOLN
INTRAMUSCULAR | Status: DC | PRN
  Administered 2018-04-13: 5 mL

## 2018-04-13 MED ORDER — CLINDAMYCIN PHOSPHATE 900 MG/50ML IV SOLN
900.0000 mg | INTRAVENOUS | Status: AC
  Administered 2018-04-13: 900 mg via INTRAVENOUS

## 2018-04-13 MED ORDER — CLINDAMYCIN PHOSPHATE 900 MG/50ML IV SOLN
INTRAVENOUS | Status: AC
  Filled 2018-04-13: qty 50

## 2018-04-13 MED ORDER — FENTANYL CITRATE (PF) 100 MCG/2ML IJ SOLN: 25.0000 ug | INTRAMUSCULAR | Status: DC | PRN

## 2018-04-13 MED ORDER — ACETAMINOPHEN 500 MG PO TABS
ORAL_TABLET | ORAL | Status: AC
  Filled 2018-04-13: qty 2

## 2018-04-13 MED ORDER — OXYCODONE HCL 5 MG PO TABS: 5.0000 mg | ORAL_TABLET | Freq: Once | ORAL | Status: DC | PRN

## 2018-04-13 MED ORDER — OXYCODONE HCL 5 MG/5ML PO SOLN: 5.0000 mg | Freq: Once | ORAL | Status: DC | PRN

## 2018-04-13 MED ORDER — KETOROLAC TROMETHAMINE 15 MG/ML IJ SOLN: 15.0000 mg | Freq: Once | INTRAMUSCULAR | Status: DC | PRN

## 2018-04-13 MED ORDER — PROPOFOL 500 MG/50ML IV EMUL
INTRAVENOUS | Status: AC
  Filled 2018-04-13: qty 50

## 2018-04-13 MED ORDER — LIDOCAINE 2% (20 MG/ML) 5 ML SYRINGE
INTRAMUSCULAR | Status: AC
  Filled 2018-04-13: qty 5

## 2018-04-13 MED ORDER — FENTANYL CITRATE (PF) 100 MCG/2ML IJ SOLN: 50.0000 ug | INTRAMUSCULAR | Status: DC | PRN

## 2018-04-13 SURGICAL SUPPLY — 55 items
APL PRP STRL LF DISP 70% ISPRP (MISCELLANEOUS) ×1
BLADE AVERAGE 25X9 (BLADE) IMPLANT
BLADE MINI RND TIP GREEN BEAV (BLADE) IMPLANT
BLADE OSC/SAG .038X5.5 CUT EDG (BLADE) IMPLANT
BLADE SURG 15 STRL LF DISP TIS (BLADE) ×1 IMPLANT
BLADE SURG 15 STRL SS (BLADE) ×2
BNDG CMPR 9X4 STRL LF SNTH (GAUZE/BANDAGES/DRESSINGS)
BNDG COHESIVE 1X5 TAN STRL LF (GAUZE/BANDAGES/DRESSINGS) ×1 IMPLANT
BNDG COHESIVE 2X5 TAN STRL LF (GAUZE/BANDAGES/DRESSINGS) IMPLANT
BNDG COHESIVE 4X5 TAN STRL (GAUZE/BANDAGES/DRESSINGS) ×2 IMPLANT
BNDG CONFORM 2 STRL LF (GAUZE/BANDAGES/DRESSINGS) ×1 IMPLANT
BNDG ESMARK 4X9 LF (GAUZE/BANDAGES/DRESSINGS) IMPLANT
BNDG GAUZE 1X2.1 STRL (MISCELLANEOUS) IMPLANT
BNDG GAUZE ELAST 4 BULKY (GAUZE/BANDAGES/DRESSINGS) ×2 IMPLANT
CHLORAPREP W/TINT 26 (MISCELLANEOUS) ×2 IMPLANT
CORD BIPOLAR FORCEPS 12FT (ELECTRODE) IMPLANT
COVER BACK TABLE REUSABLE LG (DRAPES) ×2 IMPLANT
COVER MAYO STAND REUSABLE (DRAPES) ×2 IMPLANT
COVER WAND RF STERILE (DRAPES) IMPLANT
CUFF TOURN SGL QUICK 18X4 (TOURNIQUET CUFF) IMPLANT
DEPRESSOR TONGUE BLADE STERILE (MISCELLANEOUS) ×1 IMPLANT
DRAIN PENROSE 1/2X12 LTX STRL (WOUND CARE) ×2 IMPLANT
DRAPE EXTREMITY T 121X128X90 (DISPOSABLE) ×2 IMPLANT
DRAPE SURG 17X23 STRL (DRAPES) ×3 IMPLANT
DRSG EMULSION OIL 3X3 NADH (GAUZE/BANDAGES/DRESSINGS) IMPLANT
GAUZE SPONGE 4X4 12PLY STRL (GAUZE/BANDAGES/DRESSINGS) ×1 IMPLANT
GAUZE SPONGE 4X4 12PLY STRL LF (GAUZE/BANDAGES/DRESSINGS) ×3 IMPLANT
GAUZE XEROFORM 1X8 LF (GAUZE/BANDAGES/DRESSINGS) ×1 IMPLANT
GLOVE BIO SURGEON STRL SZ7.5 (GLOVE) ×2 IMPLANT
GLOVE BIOGEL PI IND STRL 7.0 (GLOVE) ×1 IMPLANT
GLOVE BIOGEL PI IND STRL 8 (GLOVE) ×1 IMPLANT
GLOVE BIOGEL PI INDICATOR 7.0 (GLOVE) ×1
GLOVE BIOGEL PI INDICATOR 8 (GLOVE) ×1
GLOVE ECLIPSE 6.5 STRL STRAW (GLOVE) ×2 IMPLANT
GOWN STRL REUS W/ TWL LRG LVL3 (GOWN DISPOSABLE) ×2 IMPLANT
GOWN STRL REUS W/TWL LRG LVL3 (GOWN DISPOSABLE) ×4
GOWN STRL REUS W/TWL XL LVL3 (GOWN DISPOSABLE) ×2 IMPLANT
K-WIRE .045 (WIRE) ×2 IMPLANT
NDL HYPO 25X1 1.5 SAFETY (NEEDLE) IMPLANT
NEEDLE HYPO 25X1 1.5 SAFETY (NEEDLE) ×4 IMPLANT
NS IRRIG 1000ML POUR BTL (IV SOLUTION) ×2 IMPLANT
PACK BASIN DAY SURGERY FS (CUSTOM PROCEDURE TRAY) ×2 IMPLANT
PADDING CAST ABS 4INX4YD NS (CAST SUPPLIES)
PADDING CAST ABS COTTON 4X4 ST (CAST SUPPLIES) IMPLANT
RUBBERBAND STERILE (MISCELLANEOUS) IMPLANT
SLEEVE SCD COMPRESS KNEE MED (MISCELLANEOUS) IMPLANT
STOCKINETTE 6  STRL (DRAPES) ×1
STOCKINETTE 6 STRL (DRAPES) ×1 IMPLANT
SUT CHROMIC 7 0 TG140 8 (SUTURE) ×1 IMPLANT
SUT VICRYL RAPIDE 4-0 (SUTURE) ×1 IMPLANT
SUT VICRYL RAPIDE 4/0 PS 2 (SUTURE) IMPLANT
SYR 10ML LL (SYRINGE) ×4 IMPLANT
SYR BULB 3OZ (MISCELLANEOUS) ×2 IMPLANT
TOWEL GREEN STERILE FF (TOWEL DISPOSABLE) ×2 IMPLANT
UNDERPAD 30X30 (UNDERPADS AND DIAPERS) ×2 IMPLANT

## 2018-04-13 NOTE — Interval H&P Note (Signed)
History and Physical Interval Note:  04/13/2018 7:42 AM  Rick Church  has presented today for surgery, with the diagnosis of INCOMPLETE AMPUTATION OF LEFT RING FINGERTIP.  The various methods of treatment have been discussed with the patient and family. After consideration of risks, benefits and other options for treatment, the patient has consented to  Procedure(s): LEFT RING FINGERTIP REPAIR (Left) as a surgical intervention.  The patient's history has been reviewed, patient examined, no change in status, stable for surgery.  I have reviewed the patient's chart and labs.  Questions were answered to the patient's satisfaction.    On day of surgery, patient presented without available adult to stay with him overnight.  Patient also noted to be unpleasant, cursing at times.  Anesthesia MD indicated that MAC could not be provided under such circumstances.  Patient agreed to local with no MAC, and case will proceed as such.   Jolyn Nap

## 2018-04-13 NOTE — Discharge Instructions (Signed)
Discharge Instructions   You have a dressing with a splint incorporated in it. Move your fingers as much as possible, making a full fist and fully opening the fist. Elevate your hand to reduce pain & swelling of the digits.  Ice over the operative site may be helpful to reduce pain & swelling.  DO NOT USE HEAT. You were prescribed pain medications earlier this week Leave the dressing in place until you return to our office.  You may shower, but keep the bandage clean & dry.  You may drive a car when you are off of prescription pain medications and can safely control your vehicle with both hands. Our office will call you to arrange follow-up   Please call [phone] during normal business hours or [phone] after hours for any problems. Including the following:  - excessive redness of the incisions - drainage for more than 4 days - fever of more than 101.5 F  *Please note that pain medications will not be refilled after hours or on weekends.

## 2018-04-13 NOTE — Anesthesia Procedure Notes (Signed)
Procedure Name: MAC Performed by: Larue Lightner W, CRNA Pre-anesthesia Checklist: Patient identified, Emergency Drugs available, Suction available, Patient being monitored and Timeout performed Oxygen Delivery Method: Simple face mask       

## 2018-04-13 NOTE — Anesthesia Postprocedure Evaluation (Signed)
Anesthesia Post Note  Patient: Rick Church  Procedure(s) Performed: LEFT RING FINGERTIP REPAIR (Left )     Anesthesia Post Evaluation  Last Vitals:  Vitals:   04/13/18 0700  BP: (P) 97/64  Pulse: (P) 60  Resp: (P) 16  Temp: (P) 36.8 C  SpO2: (P) 93%    Last Pain:  Vitals:   04/13/18 0700  TempSrc: (P) Oral  PainSc: (P) 0-No pain                 Nolton Denis

## 2018-04-13 NOTE — Op Note (Addendum)
04/13/2018  7:45 AM  PATIENT:  Rick Church  78 y.o. male  PRE-OPERATIVE DIAGNOSIS: Left ring finger open distal phalanx fracture with incomplete amputation  POST-OPERATIVE DIAGNOSIS:  Same  PROCEDURE:   1.  Removal of sutures from left ring finger    2.  Excisional debridement of skin and subcutaneous tissues of wound of left ring finger associated with open fracture     3.  Avulsion of left ring finger nail plate    4. ORIF left ring finger P3 fracture    5.  Repair of left ring finger nailbed    6.  Intermediate repair of open wound of left ring finger associated with open fracture, 2 cm    7.  Radiologic examination of the left ring finger, 2 views  SURGEON: Rayvon Char. Grandville Silos, MD  PHYSICIAN ASSISTANT: None  ANESTHESIA:  local  SPECIMENS:  None  DRAINS:   None  EBL:  less than 50 mL  PREOPERATIVE INDICATIONS:  Rick Church is a  78 y.o. male with incomplete amputation of the left ring finger through the distal phalanx  The risks benefits and alternatives were discussed with the patient preoperatively including but not limited to the risks of infection, bleeding, nerve injury, cardiopulmonary complications, the need for revision surgery, among others, and the patient verbalized understanding and consented to proceed.  OPERATIVE IMPLANTS: 0.045 inch K wires x2  OPERATIVE PROCEDURE:  After receiving prophylactic antibiotics, the patient was escorted to the operative theatre and placed in a supine position.  Digital block was performed by me.  The procedure was done without MAC.  A surgical "time-out" was performed during which the planned procedure, proposed operative site, and the correct patient identity were compared to the operative consent and agreement confirmed by the circulating nurse according to current facility policy.  Following application of a tourniquet to the operative extremity, the exposed skin was pre-scrubbed with a Hibiclens scrub brush before being formally  prepped with Chloraprep and draped in the usual sterile fashion.    A Penrose tourniquet was applied to the base of the ring finger.  Previously placed sutures were removed.  The remainder of the connections between the nail plate and the underlying nail bed were divided with a freer elevator and the nail plate was removed.  The flap was then laid back, exposing the interior of the digit and the ends of the fracture fragments.  This was copiously irrigated and scrubbed with a sponge.  Once satisfied with the cleansing of the wound, macerated jagged nonviable skin edges were excisionally debrided with scissors and forceps.  Once satisfied with the excisional debridement, the fracture was reduced and fixed with roughly parallel 0.045 inch K wires driven from the tip of the digit into the base of the distal phalanx.  Alignment was acceptable.  Fixation having been performed, final images were obtained with the mini C arm, saved, and printed.  Attention was then shifted to the soft tissue repairs.  The nailbed was repaired with simple interrupted 7-0 chromic sutures, and the traumatic wound that extended from the ulnar nail fold across the pulp was repaired with 4-0 Vicryl Rapide interrupted sutures.  Xeroform was placed on the nailbed and tucked into the nail fold proximally.  K wires were then bent over 90/90 clipped and left lying parallel to the dorsum of the digit, overlying the nailbed.  The Penrose tourniquet was removed.  A digital dressing was applied, with a dorsal tongue blade component, allowing PIP  movement and he was taken to the recovery room in stable condition.  DISPOSITION: He will be discharged home today, with typical instructions, returning in 10 to 15 days to the office at which time he should have his dressing carefully removed and new x-rays of the left ring finger.  At that time we will fashion an aluminum splint for him.  RADIOGRAPHS: AP and lateral x-rays of the left ring finger  obtained, saved, and printed fluoroscopically reveal near-anatomic alignment of distal diaphyseal fracture of the distal phalanx, secured with parallel K wires that do not enter the DIP joint.  There is some radiographic arthritic changes at the other observed joints.

## 2018-04-13 NOTE — Transfer of Care (Signed)
Immediate Anesthesia Transfer of Care Note  Patient: Rick Church  Procedure(s) Performed: LEFT RING FINGERTIP REPAIR (Left )  Patient Location: PACU  Anesthesia Type:MAC and Regional  Level of Consciousness: awake, alert  and oriented  Airway & Oxygen Therapy: Patient Spontanous Breathing  Post-op Assessment: Report given to RN and Post -op Vital signs reviewed and stable  Post vital signs: Reviewed and stable  Last Vitals:  Vitals Value Taken Time  BP    Temp    Pulse    Resp    SpO2      Last Pain:  Vitals:   04/13/18 0700  TempSrc: (P) Oral  PainSc: (P) 0-No pain         Complications: No apparent anesthesia complications

## 2018-04-16 ENCOUNTER — Encounter (HOSPITAL_BASED_OUTPATIENT_CLINIC_OR_DEPARTMENT_OTHER): Payer: Self-pay | Admitting: Orthopedic Surgery

## 2018-04-18 NOTE — Telephone Encounter (Signed)
Please schedule WebEx at my next available and let me know when that is in case I want to try and get him a sooner spot. Thank you. GM

## 2018-04-18 NOTE — Telephone Encounter (Signed)
Hi Dr. Rush Landmark, Dr. Estell Harpin office called this morning to follow up on this referral. Is it ok to schedule pt for a webex virtual visit with you? Thank you.

## 2018-04-19 NOTE — Telephone Encounter (Signed)
Left vm to call back to offer webex or phone visit as pt has Medicare. Please let Dr. Rush Landmark know when pt is scheduled.

## 2018-04-25 ENCOUNTER — Other Ambulatory Visit: Payer: Self-pay

## 2018-04-25 ENCOUNTER — Ambulatory Visit (INDEPENDENT_AMBULATORY_CARE_PROVIDER_SITE_OTHER): Payer: Medicare Other | Admitting: Gastroenterology

## 2018-04-25 VITALS — Ht 68.0 in | Wt 177.0 lb

## 2018-04-25 DIAGNOSIS — Z86010 Personal history of colonic polyps: Secondary | ICD-10-CM | POA: Diagnosis not present

## 2018-04-25 DIAGNOSIS — D125 Benign neoplasm of sigmoid colon: Secondary | ICD-10-CM | POA: Diagnosis not present

## 2018-04-25 DIAGNOSIS — D120 Benign neoplasm of cecum: Secondary | ICD-10-CM

## 2018-04-25 DIAGNOSIS — K5730 Diverticulosis of large intestine without perforation or abscess without bleeding: Secondary | ICD-10-CM

## 2018-04-25 DIAGNOSIS — K621 Rectal polyp: Secondary | ICD-10-CM

## 2018-04-25 DIAGNOSIS — K635 Polyp of colon: Secondary | ICD-10-CM

## 2018-04-25 NOTE — Patient Instructions (Addendum)
If you are age 76 or older, your body mass index should be between 23-30. Your Body mass index is 26.91 kg/m. If this is out of the aforementioned range listed, please consider follow up with your Primary Care Provider.  If you are age 48 or younger, your body mass index should be between 19-25. Your Body mass index is 26.91 kg/m. If this is out of the aformentioned range listed, please consider follow up with your Primary Care Provider.   It has been recommended to you by your physician that you have a(n) Colonoscopy +EMR (2hrs) at hospital completed. Per your request, we did not schedule the procedure(s) today. Please contact our office at [phone] should you decide to have the procedure completed.  Your provider has requested that you go to the basement level for lab work before leaving today. Press "B" on the elevator. The lab is located at the first door on the left as you exit the elevator.  Thank you for choosing me and [facility].  Dr. Rush Landmark

## 2018-04-25 NOTE — Progress Notes (Signed)
[facility] [facility] VISIT   Primary Care Provider Aura Dials, MD [place] [address] [phone]  Referring Provider Dr. Penelope Coop  Patient Profile: Rick Church is a 78 y.o. male with a pmh significant for COPD, GERD, hyperlipidemia, hypertension, migraines, CAD (status post NSTEMI previously), history of prior CVA, reported mild aortic stenosis, anxiety, colon polyps.  The patient presents to the [facility] for an evaluation and management of problem(s) noted below:  Problem List 1. Cecal polyp   2. Polyp of sigmoid colon, unspecified type   3. Rectal polyp   4. History of colonic polyps   5. Diverticulosis of colon without hemorrhage     History of Present Illness Due to the COVID-19 Pandemic, this service was provided via telemedicine using Audio-Only. Interactive audio and video telecommunications were attempted between this provider and patient, however failed as patient did not have access to video capability and thus to provide timely and excellent care, we continued and completed visit with audio only. The patient was located at home. The provider was located in the office. The patient did consent to this visit and is aware of charges through their insurance. The patient was referred by Dr. Penelope Coop. Other persons participating in this telemedicine service were none.  The patient underwent a diagnostic colonoscopy in December 2019 for evaluation of a positive Cologuard test.  This was performed by Dr. Penelope Coop of [facility] gastro[facility]ology.  His procedure found a 30 mm polyp in the cecum that was multilobulated for which biopsies were taken showing evidence of tubular adenoma.  This was not removed.  A 4 mm polyp in the ascending colon polyp was removed.  5 sessile polyps in the transverse colon were found and removed with hot snare or cold snare.  2 sessile polyps in the descending colon were removed with hot snare.  A 15 mm  polyp in the sigmoid colon was pedunculated in the region of diverticulosis and due to positioning this could not be removed.  2 sessile polyps were also found in the rectum but these were not removed as a result of the patient having nausea and vomiting.  The patient otherwise was asymptomatic without any evidence of abdominal issues or GI bleeding.  The patient has subsequently been referred for [place] to consider possible advanced polyp resections.  The patient recently had a ring finger incident and now has pins in place and he is trying to do some therapy to try and regain strength in that hand.  This has been his main concern currently.  The patient is not on significant anticoagulation other than infrequent NSAIDs and aspirin.  GI Review of Systems Positive as above infrequent pyrosis Negative for dysphagia, odynophagia, nausea, vomiting, weight loss, change in appetite, change in bowel habits, melena, hematochezia  Review of Systems General: Denies fevers/chills HEENT: Denies oral lesions Cardiovascular: Denies chest pain Pulmonary: Denies shortness of breath or dyspnea on exertion Gastroenterological: See HPI Genitourinary: Denies darkened urine Hematological: Denies easy bruising/bleeding Dermatological: Denies jaundice Psychological: Mood is stable   Medications Current Outpatient Medications  Medication Sig Dispense Refill  . acetaminophen (TYLENOL) 500 MG tablet Take 500 mg by mouth every 6 (six) hours as needed for moderate pain or headache.     Marland Kitchen amLODipine (NORVASC) 10 MG tablet Take 10 mg by mouth daily.   1  . aspirin EC 81 MG tablet Take 81 mg by mouth at bedtime.     . butalbital-acetaminophen-caffeine (FIORICET, [place]) [phone]  MG tablet TAKE 1 TABLET BY MOUTH EVERY 6 HOURS FOR MIGRAINE 8 tablet 5  . CAMBIA 50 MG PACK TAKE AS NEEDED FOR SEVERE MIGRAINE (Patient taking differently: Take 1 each by mouth daily as needed (migraine). ) 9 each 11  . citalopram (CELEXA) 40 MG  tablet Take 40 mg by mouth at bedtime.    . clonazePAM (KLONOPIN) 2 MG tablet Take 1 tablet (2 mg total) by mouth 2 (two) times daily as needed for anxiety. 30 tablet 0  . gabapentin (NEURONTIN) 300 MG capsule TAKE 1 CAPSULE(300 MG) BY MOUTH AT BEDTIME 30 capsule 5  . HYDROmorphone (DILAUDID) 2 MG tablet Take 1 tablet (2 mg total) by mouth every 12 (twelve) hours as needed for severe pain (PATIENT ALREADY HAS AT HOME). 20 tablet 0  . ibuprofen (ADVIL,MOTRIN) 200 MG tablet Take 400 mg by mouth every 6 (six) hours as needed for headache or moderate pain.     Marland Kitchen irbesartan (AVAPRO) 300 MG tablet Take 300 mg by mouth at bedtime.   2  . meclizine (ANTIVERT) 25 MG tablet Take 25 mg by mouth 3 (three) times daily as needed for dizziness.    . Multiple Vitamin (MULTIVITAMIN WITH MINERALS) TABS tablet Take 1 tablet by mouth at bedtime.    . ondansetron (ZOFRAN) 4 MG tablet Take 4 mg by mouth every 8 (eight) hours as needed for nausea.     . pantoprazole (PROTONIX) 40 MG tablet Take 1 tablet (40 mg total) by mouth daily. (Patient taking differently: Take 40 mg by mouth daily as needed (heartburn). ) 30 tablet 0  . pregabalin (LYRICA) 75 MG capsule TAKE ONE CAPSULE BY MOUTH THREE TIMES DAILY AS NEEDED FOR PAIN (Patient taking differently: Take 75 mg by mouth 3 (three) times daily as needed (pain). ) 90 capsule 5  . simvastatin (ZOCOR) 80 MG tablet Take 40 mg by mouth at bedtime.    . traZODone (DESYREL) 100 MG tablet Take 100 mg by mouth at bedtime.   6   No current facility-administered medications for this visit.     Allergies Allergies  Allergen Reactions  . Atorvastatin Other (See Comments)    leg myalgias  . Indocin [Indomethacin] Nausea Only and Other (See Comments)    dizzy  . Penicillins Hives and Other (See Comments)    Tolerated a dose of CEFEPIME 12/31/13  . Buspirone Other (See Comments)    UNSPECIFIED INTOLERANCE  . Restoril [Temazepam] Other (See Comments)    UNSPECIFIED INTOLERANCE   . Toprol Xl [Metoprolol Tartrate] Other (See Comments)    UNSPECIFIED REACTION    . Asa [Aspirin] Itching and Rash  . Codeine Nausea And Vomiting  . Hytrin [Terazosin] Nausea And Vomiting       . Neurontin [Gabapentin] Nausea And Vomiting       . Oxycodone Nausea Only and Other (See Comments)    "Deathly sick"  . Zestril [Lisinopril] Other (See Comments)    UNSPECIFIED SPECIFIC REACTION  Makes him feel really bad, doesn't feel like getting up in the morning     Histories Past Medical History:  Diagnosis Date  . Acute respiratory distress syndrome (ARDS) (HCC)   . Anxiety   . Aortic stenosis    mild AS 01/2014 echo  . Arthritis   . Complication of anesthesia   . COPD (chronic obstructive pulmonary disease) ([place])   . Ejection fraction   . GERD (gastroesophageal reflux disease)   . Headache   . High cholesterol   .  History of hiatal hernia   . Hx of adenomatous colonic polyps   . Hypertension   . Legionnaire's disease ([place])   . Migraine   . Neck pain   . NSTEMI (non-ST elevated myocardial infarction) ([place])    12/2013; cath non-obstructive CAD  . Panic attack   . Pneumonia   . PONV (postoperative nausea and vomiting)   . 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 Stroke ([place])    hx of x 3    Past Surgical History:  Procedure Laterality Date  . AMPUTATION Left 04/13/2018   Procedure: LEFT RING FINGERTIP REPAIR;  Surgeon: Milly Jakob, MD;  Location: [facility];  Service: Orthopedics;  Laterality: Left;  . BACK SURGERY    . BIOPSY  12/13/2017   Procedure: BIOPSY;  Surgeon: Wonda Horner, MD;  Location: [facility];  Service: Endoscopy;;  . CARPAL TUNNEL RELEASE Bilateral   . CHOLECYSTECTOMY N/A 07/11/2016   Procedure: LAPAROSCOPIC CHOLECYSTECTOMY;  Surgeon: Georganna Skeans, MD;  Location: [facility];  Service: General;  Laterality: N/A;  . COLONOSCOPY WITH PROPOFOL N/A 12/13/2017   Procedure: COLONOSCOPY WITH PROPOFOL;  Surgeon: Wonda Horner, MD;  Location: [facility];   Service: Endoscopy;  Laterality: N/A;  . FOOT NEUROMA SURGERY Left 09/29/2101  . LEFT HEART CATH AND CORONARY ANGIOGRAPHY N/A 06/07/2016   Procedure: Left Heart Cath and Coronary Angiography;  Surgeon: Adrian Prows, MD;  Location: [facility];  Service: Cardiovascular;  Laterality: N/A;  . LEFT HEART CATHETERIZATION WITH CORONARY ANGIOGRAM N/A 01/06/2014   Procedure: LEFT HEART CATHETERIZATION WITH CORONARY ANGIOGRAM;  Surgeon: Peter M Martinique, MD;  Location: [facility];  Service: Cardiovascular;  Laterality: N/A;  . NECK SURGERY    . POLYPECTOMY  12/13/2017   Procedure: POLYPECTOMY;  Surgeon: Wonda Horner, MD;  Location: [facility];  Service: Endoscopy;;  . ROTATOR CUFF REPAIR Left   . SHOULDER SURGERY Right 1998, 2002  . TONSILLECTOMY    . TRACHEOSTOMY     feinstein  . TRACHEOSTOMY CLOSURE     Social History   Socioeconomic History  . Marital status: Widowed    Spouse name: Not on file  . Number of children: 0  . Years of education: GED  . Highest education level: Not on file  Occupational History  . Occupation: Disabled  Social Needs  . Financial resource strain: Not on file  . Food insecurity:    Worry: Not on file    Inability: Not on file  . Transportation needs:    Medical: Not on file    Non-medical: Not on file  Tobacco Use  . Smoking status: Current Some Day Smoker    Packs/day: 1.50    Types: Cigarettes    Start date: 11/14/1956  . Smokeless tobacco: Former Systems developer    Types: Chew  . Tobacco comment: smokes 4 cigarettes daily  Substance and Sexual Activity  . Alcohol use: No    Alcohol/week: 0.0 standard drinks  . Drug use: No  . Sexual activity: Not on file  Lifestyle  . Physical activity:    Days per week: Not on file    Minutes per session: Not on file  . Stress: Not on file  Relationships  . Social connections:    Talks on phone: Not on file    Gets together: Not on file    Attends religious service: Not on file    Active member of club or  organization: Not on file    Attends meetings of  clubs or organizations: Not on file    Relationship status: Not on file  . Intimate partner violence:    Fear of current or ex partner: Not on file    Emotionally abused: Not on file    Physically abused: Not on file    Forced sexual activity: Not on file  Other Topics Concern  . Not on file  Social History Narrative   Lives at home alone.   Right-handed.   Drinks approximately 1 liter of Dr. Malachi Bonds per day.   Family History  Problem Relation Age of Onset  . Hypertension Mother   . Brain cancer Mother   . Lung cancer Mother   . Hypertension Brother   . Cervical cancer Sister   . Hypertension Father   . Cerebral aneurysm Father   . Colon cancer Neg Hx   . Esophageal cancer Neg Hx   . Inflammatory bowel disease Neg Hx   . Liver disease Neg Hx   . Pancreatic cancer Neg Hx   . Rectal cancer Neg Hx   . Stomach cancer Neg Hx    I have reviewed his medical, social, and family history in detail and updated the electronic medical record as necessary.    PHYSICAL EXAMINATION  Telehealth visit   REVIEW OF DATA  I reviewed the following data at the time of this encounter:  GI Procedures and Studies  12/19 Colonoscopy - One 30 mm polyp in the cecum. Biopsied. - One 4 mm polyp in the ascending colon, removed with a cold biopsy forceps. Resected and retrieved. - Four 5 to 10 mm polyps in the transverse colon, removed with a hot snare. Complete resection. Partial retrieval. - One 3 mm polyp in the transverse colon, removed with a cold biopsy forceps. Resected and retrieved. - Two medium polyps in the descending colon, removed with a hot snare. Resected and retrieved. - One 15 mm polyp in the sigmoid colon. - Two small polyps in the rectum (not removed). Diagnosis 1. Colon, polyp(s), cecal - TUBULAR ADENOMA (5 OF 5 FRAGMENTS) - NO HIGH GRADE DYSPLASIA OR MALIGNANCY IDENTIFIED 2. Colon, polyp(s), ascending - TUBULAR ADENOMA (1  OF 1 FRAGMENTS) - NO HIGH GRADE DYSPLASIA OR MALIGNANCY IDENTIFIED 3. Colon, polyp(s), transverse - MULTIPLE FRAGMENTS OF TUBULAR ADENOMA(S) - NO HIGH GRADE DYSPLASIA OR MALIGNANCY IDENTIFIED 4. Colon, polyp(s), descending - TUBULAR ADENOMA (3 OF 3 FRAGMENTS) - NO HIGH GRADE DYSPLASIA OR MALIGNANCY IDENTIFIED  Laboratory Studies  Reviewed in EPIC  Imaging Studies  No relevant studies to review  ASSESSMENT  Mr. Fancher is a 77 y.o. male with a pmh significant for COPD, GERD, hyperlipidemia, hypertension, migraines, CAD (status post NSTEMI previously), history of prior CVA, reported mild aortic stenosis, anxiety, colon polyps.  The patient is seen today for evaluation and management of:  1. Cecal polyp   2. Polyp of sigmoid colon, unspecified type   3. Rectal polyp   4. History of colonic polyps   5. Diverticulosis of colon without hemorrhage    Based upon the description and endoscopic pictures I do feel that it is reasonable to pursue an Advanced Polypectomy attempt of the polyp/lesion in the cecum.  We discussed some of the techniques of advanced polypectomy which include Endoscopic Mucosal Resection, OVESCO Full-Thickness Resection, Endorotor Morcellation, and Tissue Ablation via Fulguration.  The risks and benefits of endoscopic evaluation were discussed with the patient; these include but are not limited to the risk of perforation, infection, bleeding, missed lesions, lack of diagnosis, severe  illness requiring hospitalization, as well as anesthesia and sedation related illnesses.  During attempts at advanced polypectomy, the risks of bleeding and perforation/leak are increased as opposed to diagnostic and screening colonoscopies, and that was discussed with the patient as well.   In addition, I explained that with the possible need for piecemeal resection, subsequent short-interval endoscopic evaluation for follow up and potential retreatment of the lesion/area may be necessary.  I did  offer, a referral to surgery in order for patient to have opportunity to discuss surgical management/intervention prior to finalizing decision for attempt at endoscopic removal, however, the patient deferred on this.  If, after attempt at removal of the polyp, it is found that the patient has a complication or that an invasive lesion or malignant lesion is found, or that the polyp continues to recur, the patient is aware and understands that surgery may still be indicated/required.  The patient also has a sigmoid colon polyp as well as a rectal polyp that will need to be removed we will attempt to do all that during the patient's procedure.  As a result of the patient's issue with his ring finger he is wanting to allow things to heal first before he proceeds with a colonoscopy attempt.  He understands that the longer we wait the chance of developing an interval cancer increases, although I think this is low it is entirely possible.  As result of the COVID-19 pandemic is well we have tentatively postponed some procedures and as a result of not having high-grade dysplasia I do think that if this gets pushed out to 3 months although not ideal it is something that we will likely have to cope with.  For now we will plan for procedure at the end of May or beginning of June.  All patient questions were answered, to the best of my ability, and the patient agrees to the aforementioned plan of action with follow-up as indicated.   PLAN  Laboratories as outlined below within 4 weeks of procedure date Colonoscopy with EMR to our slot to be scheduled in approximately 6 to 8 weeks as a result of the COVID-19 pandemic as well as the patient's desire to allow his ring finger to heal   Orders Placed This Encounter  Procedures  . CBC  . Basic Metabolic Panel (BMET)  . INR/PT    New Prescriptions   No medications on file   Modified Medications   No medications on file    Planned Follow Up No follow-ups on file.    Justice Britain, MD [facility] Advanced Endoscopy Office # [phone]

## 2018-04-27 ENCOUNTER — Encounter: Payer: Self-pay | Admitting: Gastroenterology

## 2018-04-27 DIAGNOSIS — K5730 Diverticulosis of large intestine without perforation or abscess without bleeding: Secondary | ICD-10-CM | POA: Insufficient documentation

## 2018-04-27 DIAGNOSIS — K635 Polyp of colon: Secondary | ICD-10-CM | POA: Insufficient documentation

## 2018-04-27 DIAGNOSIS — D125 Benign neoplasm of sigmoid colon: Secondary | ICD-10-CM

## 2018-04-27 DIAGNOSIS — D120 Benign neoplasm of cecum: Principal | ICD-10-CM

## 2018-04-27 DIAGNOSIS — K621 Rectal polyp: Secondary | ICD-10-CM | POA: Insufficient documentation

## 2018-04-27 DIAGNOSIS — Z86010 Personal history of colonic polyps: Secondary | ICD-10-CM | POA: Insufficient documentation

## 2018-04-30 ENCOUNTER — Other Ambulatory Visit: Payer: Self-pay | Admitting: Neurology

## 2018-05-25 ENCOUNTER — Telehealth: Payer: Self-pay | Admitting: Gastroenterology

## 2018-05-25 NOTE — Telephone Encounter (Signed)
Left message for pt to call back  °

## 2018-05-25 NOTE — Telephone Encounter (Signed)
Not sure that the rectal bleeding is from his colon polyps. We have been hampered due to the COVID in getting patients scheduled for all of my procedures. Rovonda/Patty, let's make him one of the first ones in June. For small-volume rectal bleeding, could be hemorrhoids (though not noted on last colonoscopy report) but would use Preparation H suppositories PRN and Preparation H. If larger volume then needs to let Dr. Penelope Coop and I know. Thanks. GM

## 2018-05-25 NOTE — Telephone Encounter (Signed)
Spoke with pt and he is aware and knows to call back if there are any large amts of blood.

## 2018-05-28 NOTE — Telephone Encounter (Signed)
The pt has been added to the list for colon in June

## 2018-06-08 ENCOUNTER — Telehealth: Payer: Self-pay

## 2018-06-08 NOTE — Telephone Encounter (Signed)
Called and left msg asking for rtn call. Need to get patient scheduled for procedure at hospital.

## 2018-06-11 ENCOUNTER — Other Ambulatory Visit: Payer: Self-pay

## 2018-06-11 ENCOUNTER — Telehealth: Payer: Self-pay

## 2018-06-11 DIAGNOSIS — Z86010 Personal history of colonic polyps: Secondary | ICD-10-CM

## 2018-06-11 DIAGNOSIS — K621 Rectal polyp: Secondary | ICD-10-CM

## 2018-06-11 DIAGNOSIS — K635 Polyp of colon: Secondary | ICD-10-CM

## 2018-06-11 MED ORDER — PEG 3350-KCL-NA BICARB-NACL 420 G PO SOLR: 4000.0000 mL | Freq: Once | ORAL | 0 refills | Status: AC

## 2018-06-11 NOTE — Telephone Encounter (Signed)
Pt return called. Pt is scheduled for  Colon+EMR ;06/25/2018 @ [facility].

## 2018-06-11 NOTE — Telephone Encounter (Signed)
Pt instructions mailed to patient.

## 2018-06-21 ENCOUNTER — Other Ambulatory Visit: Payer: Self-pay

## 2018-06-21 ENCOUNTER — Other Ambulatory Visit (HOSPITAL_COMMUNITY)
Admission: RE | Admit: 2018-06-21 | Discharge: 2018-06-21 | Disposition: A | Discharge status: Home / Self Care | Payer: Medicare Other | Source: Ambulatory Visit | Attending: Gastroenterology | Admitting: Gastroenterology

## 2018-06-21 ENCOUNTER — Telehealth: Payer: Medicare Other | Admitting: Neurology

## 2018-06-21 DIAGNOSIS — Z1159 Encounter for screening for other viral diseases: Secondary | ICD-10-CM | POA: Diagnosis present

## 2018-06-22 ENCOUNTER — Encounter (HOSPITAL_COMMUNITY): Payer: Self-pay | Admitting: *Deleted

## 2018-06-22 LAB — NOVEL CORONAVIRUS, NAA (HOSP ORDER, SEND-OUT TO REF LAB; TAT 18-24 HRS): SARS-CoV-2, NAA: NOT DETECTED

## 2018-06-22 NOTE — Progress Notes (Signed)
Mr Huhn was tested for COVID on 06/21/2018, patient has not been staying at home , he has been to [place] and [place].  Patient said that he did not know that he was supposed to stay home. He wore a mask.  Mr. Livesay procedure is scheduled for 7:30 am, he said that his brother is droping him off and is headed to [place]- he thinks his nephew will have to pick him up after he gets off work at 4:00 pm.  I notified the Endo nurse on call, Lorel Monaco, she said that that the endo staff were aware that the patient had not been staying at home since he was tested for COVID and the Endo leadership and patient's DR. Were ok to continue with the procedure.  I notified Dr Jefm Petty, he said it should be ok unless he develops fever, cough or  symptoms.

## 2018-06-24 NOTE — Anesthesia Preprocedure Evaluation (Addendum)
Anesthesia Evaluation  Patient identified by MRN, date of birth, ID band Patient awake    Reviewed: Allergy & Precautions, NPO status , Patient's Chart, lab work & pertinent test results  History of Anesthesia Complications (+) PONV and history of anesthetic complications (h/o post op aspiration)  Airway Mallampati: I  TM Distance: >3 FB Neck ROM: Full    Dental  (+) Edentulous Upper, Edentulous Lower   Pulmonary COPD, Current Smoker,  06/21/2018 novel coronavirus NEG   breath sounds clear to auscultation       Cardiovascular hypertension, Pt. 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% LAD )  + Valvular Problems/Murmurs (mild AS)  Rhythm:Regular Rate:Normal  '18 cath: 70-80% LAD, RCA 50%, EF 55-60%   Neuro/Psych vertigo CVA, No Residual Symptoms    GI/Hepatic Neg liver ROS, hiatal hernia, GERD  Poorly Controlled,  Endo/Other  negative endocrine ROS  Renal/GU negative Renal ROS     Musculoskeletal  (+) Arthritis ,   Abdominal   Peds  Hematology negative hematology ROS (+)   Anesthesia Other Findings   Reproductive/Obstetrics                            Anesthesia Physical Anesthesia Plan  ASA: III  Anesthesia Plan: General   Post-op Pain Management:    Induction: Intravenous  PONV Risk Score and Plan: 2 and Ondansetron, Dexamethasone and Treatment may vary due to age or medical condition  Airway Management Planned: Oral ETT  Additional Equipment:   Intra-op Plan:   Post-operative Plan: Extubation in OR  Informed Consent: I have reviewed the patients History and Physical, chart, labs and discussed the procedure including the risks, benefits and alternatives for the proposed anesthesia with the patient or authorized representative who has indicated his/her understanding and acceptance.       Plan Discussed with: CRNA and Surgeon  Anesthesia Plan  Comments:        Anesthesia Quick Evaluation

## 2018-06-25 ENCOUNTER — Ambulatory Visit (HOSPITAL_COMMUNITY): Payer: Medicare Other | Admitting: Anesthesiology

## 2018-06-25 ENCOUNTER — Ambulatory Visit (HOSPITAL_COMMUNITY)
Admission: RE | Admit: 2018-06-25 | Discharge: 2018-06-25 | Disposition: A | Discharge status: Home / Self Care | Payer: Medicare Other | Attending: Gastroenterology | Admitting: Gastroenterology

## 2018-06-25 ENCOUNTER — Encounter (HOSPITAL_COMMUNITY): Payer: Self-pay | Admitting: Certified Registered Nurse Anesthetist

## 2018-06-25 ENCOUNTER — Other Ambulatory Visit: Payer: Self-pay

## 2018-06-25 ENCOUNTER — Encounter (HOSPITAL_COMMUNITY)
Admission: RE | Disposition: A | Discharge status: Home / Self Care | Payer: Self-pay | Source: Home / Self Care | Attending: Gastroenterology

## 2018-06-25 DIAGNOSIS — D125 Benign neoplasm of sigmoid colon: Secondary | ICD-10-CM | POA: Insufficient documentation

## 2018-06-25 DIAGNOSIS — K644 Residual hemorrhoidal skin tags: Secondary | ICD-10-CM | POA: Diagnosis not present

## 2018-06-25 DIAGNOSIS — I252 Old myocardial infarction: Secondary | ICD-10-CM | POA: Diagnosis not present

## 2018-06-25 DIAGNOSIS — F17210 Nicotine dependence, cigarettes, uncomplicated: Secondary | ICD-10-CM | POA: Diagnosis not present

## 2018-06-25 DIAGNOSIS — Z8673 Personal history of transient ischemic attack (TIA), and cerebral infarction without residual deficits: Secondary | ICD-10-CM | POA: Diagnosis not present

## 2018-06-25 DIAGNOSIS — K621 Rectal polyp: Secondary | ICD-10-CM | POA: Diagnosis not present

## 2018-06-25 DIAGNOSIS — K641 Second degree hemorrhoids: Secondary | ICD-10-CM | POA: Insufficient documentation

## 2018-06-25 DIAGNOSIS — D122 Benign neoplasm of ascending colon: Secondary | ICD-10-CM | POA: Insufficient documentation

## 2018-06-25 DIAGNOSIS — Z89022 Acquired absence of left finger(s): Secondary | ICD-10-CM | POA: Insufficient documentation

## 2018-06-25 DIAGNOSIS — D120 Benign neoplasm of cecum: Secondary | ICD-10-CM | POA: Insufficient documentation

## 2018-06-25 DIAGNOSIS — K5730 Diverticulosis of large intestine without perforation or abscess without bleeding: Secondary | ICD-10-CM | POA: Insufficient documentation

## 2018-06-25 DIAGNOSIS — I10 Essential (primary) hypertension: Secondary | ICD-10-CM | POA: Insufficient documentation

## 2018-06-25 DIAGNOSIS — Z86010 Personal history of colonic polyps: Secondary | ICD-10-CM

## 2018-06-25 DIAGNOSIS — K635 Polyp of colon: Secondary | ICD-10-CM

## 2018-06-25 DIAGNOSIS — J449 Chronic obstructive pulmonary disease, unspecified: Secondary | ICD-10-CM | POA: Diagnosis not present

## 2018-06-25 DIAGNOSIS — I2510 Atherosclerotic heart disease of native coronary artery without angina pectoris: Secondary | ICD-10-CM | POA: Diagnosis not present

## 2018-06-25 HISTORY — PX: HEMOSTASIS CLIP PLACEMENT: SHX6857

## 2018-06-25 HISTORY — PX: SUBMUCOSAL LIFTING INJECTION: SHX6855

## 2018-06-25 HISTORY — PX: ENDOSCOPIC MUCOSAL RESECTION: SHX6839

## 2018-06-25 HISTORY — PX: POLYPECTOMY: SHX5525

## 2018-06-25 HISTORY — PX: COLONOSCOPY WITH PROPOFOL: SHX5780

## 2018-06-25 SURGERY — COLONOSCOPY WITH PROPOFOL
Anesthesia: General

## 2018-06-25 MED ORDER — ONDANSETRON HCL 4 MG/2ML IJ SOLN
INTRAMUSCULAR | Status: DC | PRN
  Administered 2018-06-25: 4 mg via INTRAVENOUS

## 2018-06-25 MED ORDER — DEXAMETHASONE SODIUM PHOSPHATE 10 MG/ML IJ SOLN
INTRAMUSCULAR | Status: DC | PRN
  Administered 2018-06-25: 10 mg via INTRAVENOUS

## 2018-06-25 MED ORDER — ETOMIDATE 2 MG/ML IV SOLN
INTRAVENOUS | Status: DC | PRN
  Administered 2018-06-25: 16 mg via INTRAVENOUS

## 2018-06-25 MED ORDER — SUCCINYLCHOLINE CHLORIDE 20 MG/ML IJ SOLN
INTRAMUSCULAR | Status: DC | PRN
  Administered 2018-06-25: 120 mg via INTRAVENOUS

## 2018-06-25 MED ORDER — SODIUM CHLORIDE 0.9 % IV SOLN: INTRAVENOUS | Status: DC

## 2018-06-25 MED ORDER — PROPOFOL 10 MG/ML IV BOLUS
INTRAVENOUS | Status: DC | PRN
  Administered 2018-06-25: 30 mg via INTRAVENOUS

## 2018-06-25 MED ORDER — LIDOCAINE HCL (CARDIAC) PF 100 MG/5ML IV SOSY
PREFILLED_SYRINGE | INTRAVENOUS | Status: DC | PRN
  Administered 2018-06-25: 30 mg via INTRAVENOUS

## 2018-06-25 MED ORDER — LACTATED RINGERS IV SOLN
INTRAVENOUS | Status: AC | PRN
  Administered 2018-06-25: 1000 mL via INTRAVENOUS

## 2018-06-25 MED ORDER — PHENYLEPHRINE HCL (PRESSORS) 10 MG/ML IV SOLN
INTRAVENOUS | Status: DC | PRN
  Administered 2018-06-25 (×2): 80 ug via INTRAVENOUS
  Administered 2018-06-25: 40 ug via INTRAVENOUS
  Administered 2018-06-25 (×2): 80 ug via INTRAVENOUS
  Administered 2018-06-25: 120 ug via INTRAVENOUS
  Administered 2018-06-25 (×2): 80 ug via INTRAVENOUS
  Administered 2018-06-25: 40 ug via INTRAVENOUS

## 2018-06-25 MED ORDER — FENTANYL CITRATE (PF) 100 MCG/2ML IJ SOLN
INTRAMUSCULAR | Status: DC | PRN
  Administered 2018-06-25: 50 ug via INTRAVENOUS

## 2018-06-25 SURGICAL SUPPLY — 22 items

## 2018-06-25 NOTE — Op Note (Signed)
[facility] Patient Name: Rick Church Procedure Date : 06/25/2018 MRN: [id number] Attending MD: Justice Britain , MD Date of Birth: 12/03/1940 CSN: [id number] Age: 78 Admit Type: Outpatient Procedure:                Colonoscopy Indications:              Excision of colonic polyp Providers:                Justice Britain, MD, Carlyn Reichert, RN, Elspeth Cho Tech., Technician, Tawni Carnes, CRNA Referring MD:             Wonda Horner, MD Medicines:                General Anesthesia Complications:            No immediate complications. Estimated Blood Loss:     Estimated blood loss was minimal. Procedure:                Pre-Anesthesia Assessment:                           - Prior to the procedure, a History and Physical                            was performed, and patient medications and                            allergies were reviewed. The patient's tolerance of                            previous anesthesia was also reviewed. The risks                            and benefits of the procedure and the sedation                            options and risks were discussed with the patient.                            All questions were answered, and informed consent                            was obtained. Prior Anticoagulants: The patient has                            taken no previous anticoagulant or antiplatelet                            agents except for aspirin. ASA Grade Assessment:                            III - A patient with severe systemic disease. After  reviewing the risks and benefits, the patient was                            deemed in satisfactory condition to undergo the                            procedure.                           After obtaining informed consent, the colonoscope                            was passed under direct vision. Throughout the   procedure, the patient's blood pressure, pulse, and                            oxygen saturations were monitored continuously. The                            CF-HQ190L ([id number]) Olympus colonoscope was                            introduced through the anus and advanced to the the                            cecum, identified by appendiceal orifice and                            ileocecal valve. The colonoscopy was technically                            difficult and complex due to significant looping                            and a tortuous colon. Successful completion of the                            procedure was aided by changing the patient's                            position, using manual pressure, withdrawing and                            reinserting the scope, straightening and shortening                            the scope to obtain bowel loop reduction and using                            scope torsion. The patient tolerated the procedure.                            The quality of the bowel preparation was adequate.  The ileocecal valve, appendiceal orifice, and                            rectum were photographed. Scope In: 7:50:12 AM Scope Out: 9:31:10 AM Scope Withdrawal Time: 1 hour 30 minutes 43 seconds  Total Procedure Duration: 1 hour 40 minutes 58 seconds  Findings:      The digital rectal exam findings include non-thrombosed external       hemorrhoids. Pertinent negatives include no palpable rectal lesions.      The left colon was significantly tortuous.      A 40 mm polyp was found in the proximal ascending colon on the wall of       the cecum. The polyp was sessile. Preparations were made for mucosal       resection. Orise gel was injected to raise the lesion. Piecemeal mucosal       resection using a snare was performed. Resection and retrieval were       complete. Fulguration to ablate the lesion edge by snare tip was       successful.  To prevent bleeding after mucosal resection, eleven       hemostatic clips were successfully placed (MR conditional). There was no       bleeding at the end of the procedure.      A 22 mm polyp was found in the ascending colon. The polyp was sessile.       Preparations were made for mucosal resection. Orise gel was injected to       raise the lesion. Snare mucosal resection was performed. Resection and       retrieval were complete. To prevent bleeding after mucosal resection,       three hemostatic clips were successfully placed (MR conditional). There       was no bleeding during, or at the end, of the procedure.      A 15 mm polyp was found in the recto-sigmoid colon. The polyp was       pedunculated. The polyp was removed with a hot snare. Resection and       retrieval were complete. To prevent bleeding after the polypectomy, two       hemostatic clips were successfully placed (MR conditional). There was no       bleeding during, or at the end, of the procedure.      Six sessile polyps were found in the rectum (1), sigmoid colon (4) and       ascending colon (1). The polyps were 2 to 7 mm in size. These polyps       were removed with a cold snare. Resection and retrieval were complete.      Three sessile polyps were found in the rectum. The polyps were 7 to 12       mm in size. These polyps were removed with a hot snare. Resection and       retrieval were complete.      Three sessile polyps were found in the sigmoid colon (2) and ascending       colon (1). The polyps were 1 to 2 mm in size. These polyps were removed       with a cold biopsy forceps. Resection and retrieval were complete.      Multiple small and large-mouthed diverticula were found in the       recto-sigmoid colon, sigmoid colon and descending colon.  Non-bleeding non-thrombosed external and internal hemorrhoids were found       during retroflexion, during perianal exam and during digital exam. The       hemorrhoids  were Grade II (internal hemorrhoids that prolapse but reduce       spontaneously). Impression:               - Non-thrombosed external hemorrhoids found on                            digital rectal exam.                           - Tortuous colon.                           - One 40 mm polyp in the cecum, removed with                            mucosal resection. Resected and retrieved. Treated                            with a hot snare. Clips (MR conditional) were                            placed.                           - One 22 mm polyp in the ascending colon, removed                            with mucosal resection. Resected and retrieved.                            Clips (MR conditional) were placed.                           - One 15 mm polyp at the recto-sigmoid colon,                            removed with a hot snare. Resected and retrieved.                            Clips (MR conditional) were placed.                           - Six 2 to 7 mm polyps in the rectum, in the                            sigmoid colon and in the ascending colon, removed                            with a cold snare. Resected and retrieved.                           - Three 7 to  12 mm polyps in the rectum, removed                            with a hot snare. Resected and retrieved.                           - Three 1 to 2 mm polyps in the sigmoid colon and                            in the ascending colon, removed with a cold biopsy                            forceps. Resected and retrieved.                           - Diverticulosis in the recto-sigmoid colon, in the                            sigmoid colon and in the descending colon.                           - Non-bleeding non-thrombosed external and internal                            hemorrhoids.                           - Mucosal resection was performed. Resection and                            retrieval were complete.                            - Mucosal resection was performed. Resection and                            retrieval were complete. Recommendation:           - The patient will be observed post-procedure,                            until all discharge criteria are met.                           - Discharge patient to home.                           - Patient has a contact number available for                            emergencies. The signs and symptoms of potential                            delayed complications were discussed with the  patient. Return to normal activities tomorrow.                            Written discharge instructions were provided to the                            patient.                           - Monitor for signs/symptoms of bleeding,                            perforation.                           - High fiber diet.                           - Continue present medications.                           - Await pathology results.                           - Repeat colonoscopy in 6 months for surveillance                            after piecemeal polypectomy of large cecal polyp.                           - Minimize NSAID use for next 2-weeks.                           - The findings and recommendations were discussed                            with the patient. Procedure Code(s):        --- Professional ---                           [phone], Colonoscopy, flexible; with endoscopic                            mucosal resection                           45385, 66, Colonoscopy, flexible; with removal of                            tumor(s), polyp(s), or other lesion(s) by snare                            technique                           45380, 52, Colonoscopy, flexible; with biopsy,  single or multiple Diagnosis Code(s):        --- Professional ---                           K63.5, Polyp of colon                           K62.1, Rectal  polyp                           K64.1, Second degree hemorrhoids                           K64.4, Residual hemorrhoidal skin tags                           K57.30, Diverticulosis of large intestine without                            perforation or abscess without bleeding                           Q43.8, Other specified congenital malformations of                            intestine CPT copyright 2019 [employer]. All rights reserved. The codes documented in this report are preliminary and upon coder review may  be revised to meet current compliance requirements. Justice Britain, MD 06/25/2018 9:58:35 AM Number of Addenda: 0

## 2018-06-25 NOTE — Transfer of Care (Signed)
Immediate Anesthesia Transfer of Care Note  Patient: VIGNESH WILLERT  Procedure(s) Performed: COLONOSCOPY WITH PROPOFOL (N/A ) ENDOSCOPIC MUCOSAL RESECTION (N/A ) SUBMUCOSAL LIFTING INJECTION FOREIGN BODY REMOVAL HEMOSTASIS CLIP PLACEMENT  Patient Location: endoscopy  Anesthesia Type:General  Level of Consciousness: awake, alert , oriented and patient cooperative  Airway & Oxygen Therapy: Patient Spontanous Breathing and Patient connected to nasal cannula oxygen  Post-op Assessment: Report given to RN and Post -op Vital signs reviewed and stable  Post vital signs: Reviewed and stable  Last Vitals:  Vitals Value Taken Time  BP    Temp    Pulse    Resp    SpO2      Last Pain:  Vitals:   06/25/18 0658  TempSrc: Oral  PainSc: 0-No pain         Complications: No apparent anesthesia complications

## 2018-06-25 NOTE — Discharge Instructions (Signed)
YOU HAD AN ENDOSCOPIC PROCEDURE TODAY: Refer to the procedure report and other information in the discharge instructions given to you for any specific questions about what was found during the examination. If this information does not answer your questions, please call [facility] office at [phone] to clarify.  ° °YOU SHOULD EXPECT: Some feelings of bloating in the abdomen. Passage of more gas than usual. Walking can help get rid of the air that was put into your GI tract during the procedure and reduce the bloating. If you had a lower endoscopy (such as a colonoscopy or flexible sigmoidoscopy) you may notice spotting of blood in your stool or on the toilet paper. Some abdominal soreness may be present for a day or two, also. ° °DIET: Your first meal following the procedure should be a light meal and then it is ok to progress to your normal diet. A half-sandwich or bowl of soup is an example of a good first meal. Heavy or fried foods are harder to digest and may make you feel nauseous or bloated. Drink plenty of fluids but you should avoid alcoholic beverages for 24 hours. If you had a esophageal dilation, please see attached instructions for diet.   ° °ACTIVITY: Your care partner should take you home directly after the procedure. You should plan to take it easy, moving slowly for the rest of the day. You can resume normal activity the day after the procedure however YOU SHOULD NOT DRIVE, use power tools, machinery or perform tasks that involve climbing or major physical exertion for 24 hours (because of the sedation medicines used during the test).  ° °SYMPTOMS TO REPORT IMMEDIATELY: °A gastroenterologist can be reached at any hour. Please call [phone]  for any of the following symptoms:  °Following lower endoscopy (colonoscopy, flexible sigmoidoscopy) °Excessive amounts of blood in the stool  °Significant tenderness, worsening of abdominal pains  °Swelling of the abdomen that is new, acute  °Fever of 100° or  higher  ° °Black, tarry-looking or red, bloody stools ° °FOLLOW UP:  °If any biopsies were taken you will be contacted by phone or by letter within the next 1-3 weeks. Call [phone]  if you have not heard about the biopsies in 3 weeks.  °Please also call with any specific questions about appointments or follow up tests.  °

## 2018-06-25 NOTE — Anesthesia Postprocedure Evaluation (Signed)
Anesthesia Post Note  Patient: REDFORD BEHRLE  Procedure(s) Performed: COLONOSCOPY WITH PROPOFOL (N/A ) ENDOSCOPIC MUCOSAL RESECTION (N/A ) SUBMUCOSAL LIFTING INJECTION FOREIGN BODY REMOVAL HEMOSTASIS CLIP PLACEMENT     Patient location during evaluation: Endoscopy Anesthesia Type: General Level of consciousness: awake and alert, patient cooperative and oriented Pain management: pain level controlled Vital Signs Assessment: post-procedure vital signs reviewed and stable Respiratory status: spontaneous breathing, nonlabored ventilation and respiratory function stable Cardiovascular status: blood pressure returned to baseline and stable Postop Assessment: no apparent nausea or vomiting Anesthetic complications: no    Last Vitals:  Vitals:   06/25/18 1010 06/25/18 1015  BP: 127/69 117/70  Pulse: (!) 58 (!) 57  Resp: 13 14  Temp:    SpO2: 93% 93%    Last Pain:  Vitals:   06/25/18 1015  TempSrc:   PainSc: 0-No pain                 Huntley Knoop,E. Sequan Auxier

## 2018-06-25 NOTE — H&P (Signed)
GASTROENTEROLOGY OUTPATIENT PROCEDURE H&P NOTE   Primary Care Physician: Aura Dials, MD  HPI: Rick Church is a 78 y.o. male who presents for Colonoscopy with attempt at EMR  Past Medical History:  Diagnosis Date  . Acute respiratory distress syndrome (ARDS) (HCC)   . Anxiety   . Aortic stenosis    mild AS 01/2014 echo  . Arthritis   . Complication of anesthesia   . COPD (chronic obstructive pulmonary disease) ([place])   . Ejection fraction   . GERD (gastroesophageal reflux disease)   . Headache   . High cholesterol   . History of hiatal hernia   . Hx of adenomatous colonic polyps   . Hypertension   . Legionnaire's disease ([place])   . Migraine   . Neck pain   . NSTEMI (non-ST elevated myocardial infarction) ([place])    12/2013; cath non-obstructive CAD  . Panic attack   . Pneumonia 2015  . PONV (postoperative nausea and vomiting)    Vomitted after last endo precodure   . 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 Stroke ([place])    hx of x 3 - no residual  -- patient was unaware of CVA   Past Surgical History:  Procedure Laterality Date  . AMPUTATION Left 04/13/2018   Procedure: LEFT RING FINGERTIP REPAIR;  Surgeon: Milly Jakob, MD;  Location: [facility];  Service: Orthopedics;  Laterality: Left;  . BACK SURGERY  2000   fusion   . BIOPSY  12/13/2017   Procedure: BIOPSY;  Surgeon: Wonda Horner, MD;  Location: [facility];  Service: Endoscopy;;  . CARPAL TUNNEL RELEASE Bilateral   . CHOLECYSTECTOMY N/A 07/11/2016   Procedure: LAPAROSCOPIC CHOLECYSTECTOMY;  Surgeon: Georganna Skeans, MD;  Location: [facility];  Service: General;  Laterality: N/A;  . COLONOSCOPY WITH PROPOFOL N/A 12/13/2017   Procedure: COLONOSCOPY WITH PROPOFOL;  Surgeon: Wonda Horner, MD;  Location: [facility];  Service: Endoscopy;  Laterality: N/A;  . FOOT NEUROMA SURGERY Left 09/29/2101  . LEFT HEART CATH AND CORONARY ANGIOGRAPHY N/A 06/07/2016   Procedure: Left Heart Cath and Coronary Angiography;   Surgeon: Adrian Prows, MD;  Location: [place];  Service: Cardiovascular;  Laterality: N/A;  . LEFT HEART CATHETERIZATION WITH CORONARY ANGIOGRAM N/A 01/06/2014   Procedure: LEFT HEART CATHETERIZATION WITH CORONARY ANGIOGRAM;  Surgeon: Peter M Martinique, MD;  Location: [facility] C[facility] LAB;  Service: Cardiovascular;  Laterality: N/A;  . LUMBAR FUSION    . NECK SURGERY    . POLYPECTOMY  12/13/2017   Procedure: POLYPECTOMY;  Surgeon: Wonda Horner, MD;  Location: [facility];  Service: Endoscopy;;  . ROTATOR CUFF REPAIR Left   . SHOULDER SURGERY Right 1998, 2002  . TONSILLECTOMY    . TRACHEOSTOMY     feinstein  . TRACHEOSTOMY CLOSURE     Current Facility-Administered Medications  Medication Dose Route Frequency Provider Last Rate Last Dose  . lactated ringers infusion    Continuous PRN Mansouraty, Telford Nab., MD   1,000 mL at 06/25/18 0705   Allergies  Allergen Reactions  . Atorvastatin Other (See Comments)    leg myalgias  . Indocin [Indomethacin] Nausea Only and Other (See Comments)    dizzy  . Penicillins Hives and Other (See Comments)    Tolerated a dose of CEFEPIME 12/31/13 Did it involve swelling of the face/tongue/throat, SOB, or low BP? No Did it involve sudden or severe rash/hives, skin peeling, or any reaction on the inside of your mouth or nose? Yes Did you  need to seek medical attention at a hospital or doctor's office? No When did it last happen?1961 If all above answers are "NO", may proceed with cephalosporin use.   . Anesthesia S-I-60 Nausea And Vomiting    Severe vomiting with anesthesia   . Buspirone Other (See Comments)    UNSPECIFIED INTOLERANCE  . Restoril [Temazepam] Other (See Comments)    UNSPECIFIED INTOLERANCE  . Toprol Xl [Metoprolol Tartrate] Other (See Comments)    UNSPECIFIED REACTION    . Asa [Aspirin] Itching and Rash  . Codeine Nausea And Vomiting  . Hytrin [Terazosin] Nausea And Vomiting       . Neurontin [Gabapentin] Nausea And Vomiting        . Oxycodone Nausea Only and Other (See Comments)    "Deathly sick"  . Zestril [Lisinopril] Other (See Comments)    UNSPECIFIED SPECIFIC REACTION  Makes him feel really bad, doesn't feel like getting up in the morning    Family History  Problem Relation Age of Onset  . Hypertension Mother   . Brain cancer Mother   . Lung cancer Mother   . Hypertension Brother   . Cervical cancer Sister   . Hypertension Father   . Cerebral aneurysm Father   . Colon cancer Neg Hx   . Esophageal cancer Neg Hx   . Inflammatory bowel disease Neg Hx   . Liver disease Neg Hx   . Pancreatic cancer Neg Hx   . Rectal cancer Neg Hx   . Stomach cancer Neg Hx    Social History   Socioeconomic History  . Marital status: Widowed    Spouse name: Not on file  . Number of children: 0  . Years of education: GED  . Highest education level: Not on file  Occupational History  . Occupation: Disabled  Social Needs  . Financial resource strain: Not on file  . Food insecurity    Worry: Not on file    Inability: Not on file  . Transportation needs    Medical: Not on file    Non-medical: Not on file  Tobacco Use  . Smoking status: Current Some Day Smoker    Packs/day: 1.50    Types: Cigarettes    Start date: 11/14/1956  . Smokeless tobacco: Former Systems developer    Types: Chew  Substance and Sexual Activity  . Alcohol use: No    Alcohol/week: 0.0 standard drinks  . Drug use: No  . Sexual activity: Not on file  Lifestyle  . Physical activity    Days per week: Not on file    Minutes per session: Not on file  . Stress: Not on file  Relationships  . Social Herbalist on phone: Not on file    Gets together: Not on file    Attends religious service: Not on file    Active member of club or organization: Not on file    Attends meetings of clubs or organizations: Not on file    Relationship status: Not on file  . Intimate partner violence    Fear of current or ex partner: Not on file     Emotionally abused: Not on file    Physically abused: Not on file    Forced sexual activity: Not on file  Other Topics Concern  . Not on file  Social History Narrative   Lives at home alone.   Right-handed.   Drinks approximately 1 liter of Dr. Malachi Bonds per day.    Physical Exam: Vital  signs in last 24 hours: Temp:  [98.4 F (36.9 C)] 98.4 F (36.9 C) (06/15 0658) Resp:  [16] 16 (06/15 0658) BP: (117)/(57) 117/57 (06/15 0658) SpO2:  [92 %] 92 % (06/15 0658) Weight:  [76.2 kg] 76.2 kg (06/15 0658)   GEN: NAD EYE: Sclerae anicteric ENT: MMM CV: RR without R/Gs  RESP: CTAB posteriorly GI: Soft, NT/ND NEURO:  Alert & Oriented x 3  Lab Results: No results for input(s): WBC, HGB, HCT, PLT in the last 72 hours. BMET No results for input(s): NA, K, CL, CO2, GLUCOSE, BUN, CREATININE, CALCIUM in the last 72 hours. LFT No results for input(s): PROT, ALBUMIN, AST, ALT, ALKPHOS, BILITOT, BILIDIR, IBILI in the last 72 hours. PT/INR No results for input(s): LABPROT, INR in the last 72 hours.   Impression / Plan: This is a 78 y.o.male who presents for Colonoscopy with attempt at EMR.  Based upon the description and endoscopic pictures I do feel that it is reasonable to pursue an Advanced Polypectomy attempt of the polyp/lesion.  We discussed some of the techniques of advanced polypectomy which include Endoscopic Mucosal Resection, OVESCO Full-Thickness Resection, Endorotor Morcellation, and Tissue Ablation via Fulguration.  The risks and benefits of endoscopic evaluation were discussed with the patient; these include but are not limited to the risk of perforation, infection, bleeding, missed lesions, lack of diagnosis, severe illness requiring hospitalization, as well as anesthesia and sedation related illnesses.  During attempts at advanced polypectomy, the risks of bleeding and perforation/leak are increased as opposed to diagnostic and screening colonoscopies, and that was discussed with  the patient as well.   In addition, I explained that with the possible need for piecemeal resection, subsequent short-interval endoscopic evaluation for follow up and potential retreatment of the lesion/area may be necessary.  If, after attempt at removal of the polyp, it is found that the patient has a complication or that an invasive lesion or malignant lesion is found, or that the polyp continues to recur, the patient is aware and understands that surgery may still be indicated/required.  The risks and benefits of endoscopic evaluation were discussed with the patient; these include but are not limited to the risk of perforation, infection, bleeding, missed lesions, lack of diagnosis, severe illness requiring hospitalization, as well as anesthesia and sedation related illnesses.  The patient is agreeable to proceed.    Justice Britain, MD [facility] Advanced Endoscopy Office # [phone]

## 2018-06-25 NOTE — Anesthesia Procedure Notes (Signed)
Procedure Name: Intubation Date/Time: 06/25/2018 7:38 AM Performed by: Shirlyn Goltz, CRNA Pre-anesthesia Checklist: Patient identified, Emergency Drugs available, Suction available and Patient being monitored Patient Re-evaluated:Patient Re-evaluated prior to induction Oxygen Delivery Method: Circle system utilized Preoxygenation: Pre-oxygenation with 100% oxygen Induction Type: IV induction, Rapid sequence and Cricoid Pressure applied Laryngoscope Size: Mac and 4 Grade View: Grade I Tube type: Oral Tube size: 7.0 mm Number of attempts: 1 Airway Equipment and Method: Stylet Placement Confirmation: ETT inserted through vocal cords under direct vision,  positive ETCO2 and breath sounds checked- equal and bilateral Secured at: 22 cm Tube secured with: Tape Dental Injury: Teeth and Oropharynx as per pre-operative assessment

## 2018-06-28 ENCOUNTER — Encounter: Payer: Self-pay | Admitting: Gastroenterology

## 2018-07-11 NOTE — Progress Notes (Signed)
Virtual Visit via Telephone Note The purpose of this virtual visit is to provide medical care while limiting exposure to the novel coronavirus.    Consent was obtained for telephone visit:  Yes Answered questions that patient had about telehealth interaction:  Yes I discussed the limitations, risks, security and privacy concerns of performing an evaluation and management service by telemedicine. I also discussed with the patient that there may be a patient responsible charge related to this service. The patient expressed understanding and agreed to proceed.  Pt location: Home Physician Location: Home Name of referring provider:  Aura Dials, MD I connected with Sheela Stack at patients initiation/request on 07/12/2018 at  1:10 PM EDT by telephone  and verified that I am speaking with the correct person using two identifiers. Pt MRN:  [id number] Pt DOB:  02-08-1940 Telephone Participants:  Sheela Stack   History of Present Illness:  Rick Church is a 78 year old male with COPD, HTN, CAD s/p NSTEMI, arthritis and history of Legionnaire's pneumonia who follows up for dizziness and migraines.    UPDATE: Current medications:  Gabapentin 300mg  daily, Fioricet, Zofran, meclizine, Lyrica  Dizziness still an issue.  When he turns his head to the left, he starts to feel spins.  He has been averaging 5 headaches a month.  HISTORY: Since January or February 2018, he has not been feeling well. He reported onset of dizziness. He has prior history of vertigo, but this was different. He described this dizziness as feeling "drunk". There was a sense of movement but not spinning. It was not positional. He also endorsed nausea and abdominal discomfort. He was found to have gallstones. After they were removed in early July, the dizziness and nausea became worse. Prior to surgery, they were episodic, where he would have 3 or 4 days of no dizziness at a time. After the surgery, it was  constant. He has had falls due to unsteadiness. He sometimes noted sunspots and double vision. He denied hearing loss but has chronic tinnitus. He denied slurred speech, facial droop or unilateral numbness or weakness. Meclizine was ineffective. Zofran did not help with nausea. He tried the Epley maneuver, which helped with his previous vertigo, but it was ineffective. CT of head from 09/04/16 was personally reviewed and revealed signs of cerebrovascular disease but no acute findings or mass lesions. He takes Lyrica once daily for neuralgia. In September 2018, he started taking it 3 times daily (as originally prescribed) and the dizziness and nausea resolved. It subsequently returned.  He later was started on gabapentin 300mg  at bedtime and underwent vestibular rehab and dizziness again resolved.    CTA of head was performed on 10/11/16, which was personally reviewed and revealed no significant intracranial stenosis or occlusion, including the vertebrobasilar system.   He has significant stroke risk factors. He has prior strokes. He is a smoker. He has hypertension and CAD. He takes ASA 81mg  daily.  He also has longstanding history of migraines since the early 1960s, described as severe and pounding, beginning from back of neck and involving the front of his head. Associated nausea.  They would occur daily. Stress is trigger.  Fioricet helps relieve it.  Past medication includes nortriptyline.  Cambia was effective in the past.   Past Medical History: Past Medical History:  Diagnosis Date   Acute respiratory distress syndrome (ARDS) (HCC)    Anxiety    Aortic stenosis    mild AS 01/2014 echo   Arthritis  Complication of anesthesia    COPD (chronic obstructive pulmonary disease) (HCC)    Ejection fraction    GERD (gastroesophageal reflux disease)    Headache    High cholesterol    History of hiatal hernia    Hx of adenomatous colonic polyps    Hypertension      Legionnaire's disease ([place])    Migraine    Neck pain    NSTEMI (non-ST elevated myocardial infarction) ([place])    12/2013; cath non-obstructive CAD   Panic attack    Pneumonia 2015   PONV (postoperative nausea and vomiting)    Vomitted after last endo precodure    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     amLODipine (NORVASC) 10 MG tablet Take 10 mg by mouth at bedtime. (2100)    aspirin EC 81 MG tablet Take 81 mg by mouth at bedtime. (2100)    butalbital-acetaminophen-caffeine (FIORICET, ESGIC) 50-325-40 MG tablet TAKE 1 TABLET BY MOUTH EVERY 6 HOURS FOR MIGRAINE (Patient taking differently: Take 1 tablet by mouth every 6 (six) hours as needed for migraine. )    CAMBIA 50 MG PACK TAKE AS NEEDED FOR SEVERE MIGRAINE (Patient taking differently: Take 1 each by mouth daily as needed (migraine). )    citalopram (CELEXA) 40 MG tablet Take 40 mg by mouth at bedtime.    clonazePAM (KLONOPIN) 2 MG tablet Take 1 tablet (2 mg total) by mouth 2 (two) times daily as needed for anxiety. (Patient taking differently: Take 2 mg by mouth at bedtime. )    gabapentin (NEURONTIN) 300 MG capsule TAKE 1 CAPSULE(300 MG) BY MOUTH AT BEDTIME (Patient taking differently: Take 300 mg by mouth daily. )    ibuprofen (ADVIL,MOTRIN) 200 MG tablet Take 400 mg by mouth every 8 (eight) hours as needed for headache or moderate pain.     irbesartan (AVAPRO) 300 MG tablet Take 300 mg by mouth at bedtime. (2100)    meclizine (ANTIVERT) 25 MG tablet Take 50-150 mg by mouth See admin instructions. Take 2-3 tablets by mouth scheduled in the morning & take 5-6 tablets by mouth scheduled at night; take 2-3 tablets if needed for additional dizziness or  nausea.    Multiple Vitamin (MULTIVITAMIN WITH MINERALS) TABS tablet Take 1 tablet by mouth at bedtime. (2100)    ondansetron (ZOFRAN) 4 MG tablet Take 8 mg by mouth every 8 (eight) hours as needed for nausea.     ondansetron (ZOFRAN-ODT) 4 MG disintegrating tablet Take 8 mg by mouth daily.    pantoprazole (PROTONIX) 40 MG tablet Take 1 tablet (40 mg total) by mouth daily. (Patient taking differently: Take 40 mg by mouth daily as needed (heartburn). )    pregabalin (LYRICA) 75 MG capsule TAKE ONE CAPSULE BY MOUTH THREE TIMES DAILY AS NEEDED FOR PAIN (Patient taking differently: Take 75 mg by mouth 3 (three) times daily as needed (pain). )    simvastatin (ZOCOR) 80 MG tablet Take 40 mg by mouth at bedtime. (2100) 09/04/2016: @ 2100   traZODone (DESYREL) 100 MG tablet Take 100 mg by mouth at bedtime.     No facility-administered encounter medications on file as of 07/12/2018.  Allergies: Allergies  Allergen Reactions   Atorvastatin Other (See Comments)    leg myalgias   Indocin [Indomethacin] Nausea Only and Other (See Comments)    dizzy   Penicillins Hives and Other (See Comments)    Tolerated a dose of CEFEPIME 12/31/13 Did it involve swelling of the face/tongue/throat, SOB, or low BP? No Did it involve sudden or severe rash/hives, skin peeling, or any reaction on the inside of your mouth or nose? Yes Did you need to seek medical attention at a hospital or doctor's office? No When did it last happen?1961 If all above answers are NO, may proceed with cephalosporin use.    Anesthesia S-I-60 Nausea And Vomiting    Severe vomiting with anesthesia    Buspirone Other (See Comments)    UNSPECIFIED INTOLERANCE   Restoril [Temazepam] Other (See Comments)    UNSPECIFIED INTOLERANCE   Toprol Xl [Metoprolol Tartrate] Other (See Comments)    UNSPECIFIED REACTION     Asa [Aspirin] Itching and Rash   Codeine Nausea And Vomiting   Hytrin [Terazosin] Nausea And Vomiting          Neurontin [Gabapentin] Nausea And Vomiting        Oxycodone Nausea Only and Other (See Comments)    "Deathly sick"   Zestril [Lisinopril] Other (See Comments)    UNSPECIFIED SPECIFIC REACTION  Makes him feel really bad, doesn't feel like getting up in the morning     Family History: Family History  Problem Relation Age of Onset   Hypertension Mother    Brain cancer Mother    Lung cancer Mother    Hypertension Brother    Cervical cancer Sister    Hypertension Father    Cerebral aneurysm Father    Colon cancer Neg Hx    Esophageal cancer Neg Hx    Inflammatory bowel disease Neg Hx    Liver disease Neg Hx    Pancreatic cancer Neg Hx    Rectal cancer Neg Hx    Stomach cancer Neg Hx     Social History: Social History   Socioeconomic History   Marital status: Widowed    Spouse name: Not on file   Number of children: 0   Years of education: GED   Highest education level: Not on file  Occupational History   Occupation: Disabled  Scientist, product/process development strain: Not on file   Food insecurity    Worry: Not on file    Inability: Not on file   Transportation needs    Medical: Not on file    Non-medical: Not on file  Tobacco Use   Smoking status: Current Some Day Smoker    Packs/day: 1.50    Types: Cigarettes    Start date: 11/14/1956   Smokeless tobacco: Former Systems developer    Types: Chew  Substance and Sexual Activity   Alcohol use: No    Alcohol/week: 0.0 standard drinks   Drug use: No   Sexual activity: Not on file  Lifestyle   Physical activity    Days per week: Not on file    Minutes per session: Not on file   Stress: Not on file  Relationships   Social connections    Talks on phone: Not on file    Gets together: Not on file    Attends religious service: Not on file    Active member of club or organization: Not on file    Attends meetings of clubs or organizations: Not on file  Relationship status: Not on file    Intimate partner violence    Fear of current or ex partner: Not on file    Emotionally abused: Not on file    Physically abused: Not on file    Forced sexual activity: Not on file  Other Topics Concern   Not on file  Social History Narrative   Lives at home alone.   Right-handed.   Drinks approximately 1 liter of Dr. Malachi Bonds per day.    Observations/Objective:   There were no vitals taken for this visit. No acute distress.  Alert and oriented.  Speech fluent and not dysarthric.  Language intact.  Face symmetric.  Assessment and Plan:   1.  Dizziness 2.  Migraine without aura, without status migrainosus, not intractable  1.  For preventative management, increase gabapentin to 300mg  twice daily 2.  For abortive therapy, Fioricet 3.  Limit use of pain relievers to no more than 2 days out of week to prevent risk of rebound or medication-overuse headache. 4.  Keep headache diary 5.  Exercise, hydration, caffeine cessation, sleep hygiene, monitor for and avoid triggers 6.  Consider:  magnesium citrate 400mg  daily, riboflavin 400mg  daily, and coenzyme Q10 100mg  three times daily 7. Always keep in mind that currently taking a hormone or birth control may be a possible trigger or aggravating factor for migraine. 8. Follow up 6 months.  Follow Up Instructions:    -I discussed the assessment and treatment plan with the patient. The patient was provided an opportunity to ask questions and all were answered. The patient agreed with the plan and demonstrated an understanding of the instructions.   The patient was advised to call back or seek an in-person evaluation if the symptoms worsen or if the condition fails to improve as anticipated.    Total Time spent in visit with the patient was:  12 minutes  Dudley Major, DO

## 2018-07-12 ENCOUNTER — Encounter: Payer: Self-pay | Admitting: Neurology

## 2018-07-12 ENCOUNTER — Other Ambulatory Visit: Payer: Self-pay

## 2018-07-12 ENCOUNTER — Telehealth (INDEPENDENT_AMBULATORY_CARE_PROVIDER_SITE_OTHER): Payer: Medicare Other | Admitting: Neurology

## 2018-07-12 DIAGNOSIS — G43009 Migraine without aura, not intractable, without status migrainosus: Secondary | ICD-10-CM | POA: Diagnosis not present

## 2018-07-12 DIAGNOSIS — R42 Dizziness and giddiness: Secondary | ICD-10-CM

## 2018-07-12 MED ORDER — GABAPENTIN 300 MG PO CAPS: 300.0000 mg | ORAL_CAPSULE | Freq: Two times a day (BID) | ORAL | 5 refills | Status: DC

## 2018-07-16 ENCOUNTER — Telehealth: Payer: Self-pay | Admitting: General Surgery

## 2018-07-16 NOTE — Telephone Encounter (Signed)
Left a voicemail to pre-screen for virtual visit. ta

## 2018-07-17 ENCOUNTER — Ambulatory Visit (INDEPENDENT_AMBULATORY_CARE_PROVIDER_SITE_OTHER): Payer: Medicare Other | Admitting: Gastroenterology

## 2018-07-17 ENCOUNTER — Other Ambulatory Visit: Payer: Self-pay

## 2018-07-17 ENCOUNTER — Other Ambulatory Visit: Payer: Self-pay | Admitting: Gastroenterology

## 2018-07-17 DIAGNOSIS — D120 Benign neoplasm of cecum: Secondary | ICD-10-CM | POA: Diagnosis not present

## 2018-07-17 DIAGNOSIS — Z860100 Personal history of colon polyps, unspecified: Secondary | ICD-10-CM

## 2018-07-17 DIAGNOSIS — K635 Polyp of colon: Secondary | ICD-10-CM

## 2018-07-17 DIAGNOSIS — R1310 Dysphagia, unspecified: Secondary | ICD-10-CM | POA: Diagnosis not present

## 2018-07-17 DIAGNOSIS — R110 Nausea: Secondary | ICD-10-CM | POA: Diagnosis not present

## 2018-07-17 DIAGNOSIS — R42 Dizziness and giddiness: Secondary | ICD-10-CM

## 2018-07-17 MED ORDER — FAMOTIDINE 20 MG PO TABS: 20.0000 mg | ORAL_TABLET | Freq: Two times a day (BID) | ORAL | 1 refills | Status: DC

## 2018-07-17 NOTE — Patient Instructions (Addendum)
Your provider has requested that you go to the basement level for lab work before leaving today. Press "B" on the elevator. The lab is located at the first door on the left as you exit the elevator.  It has been recommended to you by your physician that you have a(n) Colon +EMR(7min) completed. We did not schedule the procedure(s) today You will need this procedure done in 6 months(01/2019). We will place a recall for this.    We have sent the following medications to your pharmacy for you to pick up at your convenience: Pepcid 20mg  twice daily.    You have been scheduled for a Barium Esophogram at [facility] (1st floor of the hospital) on 07/24/2018 at 9:30am. Please arrive 15 minutes prior to your appointment for registration. Make certain not to have anything to eat or drink 3 hours prior to your test. If you need to reschedule for any reason, please contact radiology at [phone] to do so. __________________________________________________________________ A barium swallow is an examination that concentrates on views of the esophagus. This tends to be a double contrast exam (barium and two liquids which, when combined, create a gas to distend the wall of the oesophagus) or single contrast (non-ionic iodine based). The study is usually tailored to your symptoms so a good history is essential. Attention is paid during the study to the form, structure and configuration of the esophagus, looking for functional disorders (such as aspiration, dysphagia, achalasia, motility and reflux) EXAMINATION You may be asked to change into a gown, depending on the type of swallow being performed. A radiologist and radiographer will perform the procedure. The radiologist will advise you of the type of contrast selected for your procedure and direct you during the exam. You will be asked to stand, sit or lie in several different positions and to hold a small amount of fluid in your mouth before being  asked to swallow while the imaging is performed .In some instances you may be asked to swallow barium coated marshmallows to assess the motility of a solid food bolus. The exam can be recorded as a digital or video fluoroscopy procedure. POST PROCEDURE It will take 1-2 days for the barium to pass through your system. To facilitate this, it is important, unless otherwise directed, to increase your fluids for the next 24-48hrs and to resume your normal diet.  This test typically takes about 30 minutes to perform. ____________________________________________________  Please also make a follow-up appointment with your Ear Nose and Throat office- Dr. Benjamine Mola regarding your Vertigo.       Thank you for choosing me and [facility].  Dr. Rush Landmark

## 2018-07-17 NOTE — Progress Notes (Signed)
[place] VISIT   Primary Care Provider Aura Dials, MD [place] [address] [phone]  Patient Profile: Rick Church is a 78 y.o. male with a pmh significant for COPD, GERD, hyperlipidemia, hypertension, migraines, CAD (status post NSTEMI previously), history of prior CVA, reported mild aortic stenosis, anxiety, colon polyps (TAs).  The patient presents to the [facility] for an evaluation and management of problem(s) noted below:  Problem List 1. Cecal polyp   2. History of colonic polyps   3. Nausea without vomiting   4. Dysphagia, unspecified type   5. Vertigo     I connected with Sheela Stack on 07/17/18. Due to the COVID-19 Pandemic, this service was provided via telemedicine using Audio-Only. Interactive audio and video telecommunications were attempted between this provider and patient, however failed as patient did not have access to video capability and thus to provide timely and excellent care, we continued and completed visit with audio only. The patient was located at home. The provider was located in the office. The patient did consent to this visit and is aware of charges through their insurance. Other persons participating in this telemedicine service were none. Time spent on visit was 28 minutes on the session and in coordination of care and review of previous colonoscopy results.   History of Present Illness Please see initial consultation note for full details of HPI.   Interval History Today was a follow-up for the patient from his recent colonoscopy with EMR.  The patient has been doing well since his procedure from a colon perspective.  As noted below he had multiple tubular adenomas and 2 larger adenomas that were removed.  He will require a six-month follow-up which he is aware of.  His primary gastroenterologist, Dr. Penelope Coop, I seen him in the past for other GI symptoms and the patient has  not reach back out to Dr. Penelope Coop as of yet.  The patient wanted to describe some GI symptoms.  Overall, the patient bowel habits are normal occurring 1-2 times per day.  He is not had any blood in his stool since the first day after his procedure.  The patient states for the last 2 years that he has had nausea on a near daily basis.  This nausea is associated with dizziness and he has been told in the past that he may have vertigo.  He takes Dramamine on a daily basis and has Zofran.  He has seen Dr. Benjamine Mola in the past from otolaryngology.  When he had seen ENT in the past he describes being told that he needed a follow-up but as a result of the COVID-19 pandemic he was unable to do that.  The patient states he has heartburn on an infrequent basis but has been prescribed Protonix.  He however, does not take Protonix on a daily basis but rather on an as-needed basis.  If he bends forward he has worsening symptoms.  He denies any overt dysphagia currently but has had issues of food passage in the past.  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  If he drinks water or fluids things will go down but in the past he has described solid food dysphagia.  The patient's weight has been stable.  He denies any odynophagia.  The patient does take nonsteroidals.  GI Review of Systems Positive as above Negative for abdominal pain, change in bowel habits, melena, hematochezia, vomiting  Review of Systems General: Denies fevers/chills HEENT: Denies oral lesions  Cardiovascular: Denies chest pain Pulmonary: Shortness of breath at baseline Gastroenterological: See HPI Genitourinary: Denies darkened urine Hematological: Denies easy bruising/bleeding Dermatological: Denies jaundice Psychological: Mood is stable   Medications Current Outpatient Medications  Medication Sig Dispense Refill  . acetaminophen (TYLENOL) 500 MG tablet Take 500 mg by mouth every 6 (six) hours as  needed for moderate pain or headache.     Marland Kitchen amLODipine (NORVASC) 10 MG tablet Take 10 mg by mouth at bedtime. (2100)  1  . aspirin EC 81 MG tablet Take 81 mg by mouth at bedtime. (2100)    . butalbital-acetaminophen-caffeine (FIORICET, ESGIC) 50-325-40 MG tablet TAKE 1 TABLET BY MOUTH EVERY 6 HOURS FOR MIGRAINE (Patient taking differently: Take 1 tablet by mouth every 6 (six) hours as needed for migraine. ) 8 tablet 5  . CAMBIA 50 MG PACK TAKE AS NEEDED FOR SEVERE MIGRAINE (Patient taking differently: Take 1 each by mouth daily as needed (migraine). ) 9 each 11  . citalopram (CELEXA) 40 MG tablet Take 40 mg by mouth at bedtime.    . clonazePAM (KLONOPIN) 2 MG tablet Take 1 tablet (2 mg total) by mouth 2 (two) times daily as needed for anxiety. (Patient taking differently: Take 2 mg by mouth at bedtime. ) 30 tablet 0  . famotidine (PEPCID) 20 MG tablet TAKE 1 TABLET BY MOUTH TWICE DAILY 180 tablet 2  . gabapentin (NEURONTIN) 300 MG capsule Take 1 capsule (300 mg total) by mouth 2 (two) times daily. 60 capsule 5  . ibuprofen (ADVIL,MOTRIN) 200 MG tablet Take 400 mg by mouth every 8 (eight) hours as needed for headache or moderate pain.     Marland Kitchen irbesartan (AVAPRO) 300 MG tablet Take 300 mg by mouth at bedtime. (2100)  2  . meclizine (ANTIVERT) 25 MG tablet Take 50-150 mg by mouth See admin instructions. Take 2-3 tablets by mouth scheduled in the morning & take 5-6 tablets by mouth scheduled at night; take 2-3 tablets if needed for additional dizziness or nausea.    . Multiple Vitamin (MULTIVITAMIN WITH MINERALS) TABS tablet Take 1 tablet by mouth at bedtime. (2100)    . ondansetron (ZOFRAN) 4 MG tablet Take 8 mg by mouth every 8 (eight) hours as needed for nausea.     . ondansetron (ZOFRAN-ODT) 4 MG disintegrating tablet Take 8 mg by mouth daily.    . pantoprazole (PROTONIX) 40 MG tablet Take 1 tablet (40 mg total) by mouth daily. (Patient taking differently: Take 40 mg by mouth daily as needed  (heartburn). ) 30 tablet 0  . pregabalin (LYRICA) 75 MG capsule TAKE ONE CAPSULE BY MOUTH THREE TIMES DAILY AS NEEDED FOR PAIN (Patient taking differently: Take 75 mg by mouth 3 (three) times daily as needed (pain). ) 90 capsule 5  . simvastatin (ZOCOR) 80 MG tablet Take 40 mg by mouth at bedtime. (2100)    . traZODone (DESYREL) 100 MG tablet Take 100 mg by mouth at bedtime.   6   No current facility-administered medications for this visit.     Allergies Allergies  Allergen Reactions  . Atorvastatin Other (See Comments)    leg myalgias  . Indocin [Indomethacin] Nausea Only and Other (See Comments)    dizzy  . Penicillins Hives and Other (See Comments)    Tolerated a dose of CEFEPIME 12/31/13 Did it involve swelling of the face/tongue/throat, SOB, or low BP? No Did it involve sudden or severe rash/hives, skin  peeling, or any reaction on the inside of your mouth or nose? Yes Did you need to seek medical attention at a hospital or doctor's office? No When did it last happen?1961 If all above answers are "NO", may proceed with cephalosporin use.   . Anesthesia S-I-60 Nausea And Vomiting    Severe vomiting with anesthesia   . Buspirone Other (See Comments)    UNSPECIFIED INTOLERANCE  . Restoril [Temazepam] Other (See Comments)    UNSPECIFIED INTOLERANCE  . Toprol Xl [Metoprolol Tartrate] Other (See Comments)    UNSPECIFIED REACTION    . Asa [Aspirin] Itching and Rash  . Codeine Nausea And Vomiting  . Hytrin [Terazosin] Nausea And Vomiting       . Neurontin [Gabapentin] Nausea And Vomiting       . Oxycodone Nausea Only and Other (See Comments)    "Deathly sick"  . Zestril [Lisinopril] Other (See Comments)    UNSPECIFIED SPECIFIC REACTION  Makes him feel really bad, doesn't feel like getting up in the morning     Histories Past Medical History:  Diagnosis Date  . Acute respiratory distress syndrome (ARDS) (HCC)   . Anxiety   . Aortic stenosis    mild AS 01/2014 echo  .  Arthritis   . Complication of anesthesia   . COPD (chronic obstructive pulmonary disease) ([place])   . Ejection fraction   . GERD (gastroesophageal reflux disease)   . Headache   . High cholesterol   . History of hiatal hernia   . Hx of adenomatous colonic polyps   . Hypertension   . Legionnaire's disease ([place])   . Migraine   . Neck pain   . NSTEMI (non-ST elevated myocardial infarction) ([place])    12/2013; cath non-obstructive CAD  . Panic attack   . Pneumonia 2015  . PONV (postoperative nausea and vomiting)    Vomitted after last endo precodure   . 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 Stroke ([place])    hx of x 3 - no residual  -- patient was unaware of CVA   Past Surgical History:  Procedure Laterality Date  . AMPUTATION Left 04/13/2018   Procedure: LEFT RING FINGERTIP REPAIR;  Surgeon: Milly Jakob, MD;  Location: [facility];  Service: Orthopedics;  Laterality: Left;  . BACK SURGERY  2000   fusion   . BIOPSY  12/13/2017   Procedure: BIOPSY;  Surgeon: Wonda Horner, MD;  Location: [facility];  Service: Endoscopy;;  . CARPAL TUNNEL RELEASE Bilateral   . CHOLECYSTECTOMY N/A 07/11/2016   Procedure: LAPAROSCOPIC CHOLECYSTECTOMY;  Surgeon: Georganna Skeans, MD;  Location: [facility];  Service: General;  Laterality: N/A;  . COLONOSCOPY WITH PROPOFOL N/A 12/13/2017   Procedure: COLONOSCOPY WITH PROPOFOL;  Surgeon: Wonda Horner, MD;  Location: [facility];  Service: Endoscopy;  Laterality: N/A;  . COLONOSCOPY WITH PROPOFOL N/A 06/25/2018   Procedure: COLONOSCOPY WITH PROPOFOL;  Surgeon: Rush Landmark Telford Nab., MD;  Location: [facility] [place];  Service: Gastroenterology;  Laterality: N/A;  . ENDOSCOPIC MUCOSAL RESECTION N/A 06/25/2018   Procedure: ENDOSCOPIC MUCOSAL RESECTION;  Surgeon: Rush Landmark Telford Nab., MD;  Location: [facility];  Service: Gastroenterology;  Laterality: N/A;  . FOOT NEUROMA SURGERY Left 09/29/2101  . HEMOSTASIS CLIP PLACEMENT  06/25/2018   Procedure: HEMOSTASIS CLIP  PLACEMENT;  Surgeon: Irving Copas., MD;  Location: [place];  Service: Gastroenterology;;  . LEFT HEART CATH AND CORONARY ANGIOGRAPHY N/A 06/07/2016   Procedure: Left Heart Cath and Coronary Angiography;  Surgeon: Adrian Prows, MD;  Location: [facility];  Service: Cardiovascular;  Laterality: N/A;  . LEFT HEART CATHETERIZATION WITH CORONARY ANGIOGRAM N/A 01/06/2014   Procedure: LEFT HEART CATHETERIZATION WITH CORONARY ANGIOGRAM;  Surgeon: Peter M Martinique, MD;  [facility];  Service: Cardiovascular;  Laterality: N/A;  . LUMBAR FUSION    . NECK SURGERY    . POLYPECTOMY  12/13/2017   Procedure: POLYPECTOMY;  Surgeon: Wonda Horner, MD;  Location: [facility];  Service: Endoscopy;;  . POLYPECTOMY  06/25/2018   Procedure: POLYPECTOMY;  Surgeon: Irving Copas., MD;  Location: [place];  Service: Gastroenterology;;  . Cherre Robins CUFF REPAIR Left   . SHOULDER SURGERY Right 1998, 2002  . SUBMUCOSAL LIFTING INJECTION  06/25/2018   Procedure: SUBMUCOSAL LIFTING INJECTION;  Surgeon: Rush Landmark Telford Nab., MD;  Location: [place];  Service: Gastroenterology;;  . TONSILLECTOMY    . TRACHEOSTOMY     feinstein  . TRACHEOSTOMY CLOSURE     Social History   Socioeconomic History  . Marital status: Widowed    Spouse name: Not on file  . Number of children: 0  . Years of education: GED  . Highest education level: Not on file  Occupational History  . Occupation: Disabled  Social Needs  . Financial resource strain: Not on file  . Food insecurity    Worry: Not on file    Inability: Not on file  . Transportation needs    Medical: Not on file    Non-medical: Not on file  Tobacco Use  . Smoking status: Current Some Day Smoker    Packs/day: 1.50    Types: Cigarettes    Start date: 11/14/1956  . Smokeless tobacco: Former Systems developer    Types: Chew  Substance and Sexual Activity  . Alcohol use: No    Alcohol/week: 0.0 standard drinks  . Drug use: No  . Sexual  activity: Not on file  Lifestyle  . Physical activity    Days per week: Not on file    Minutes per session: Not on file  . Stress: Not on file  Relationships  . Social Herbalist on phone: Not on file    Gets together: Not on file    Attends religious service: Not on file    Active member of club or organization: Not on file    Attends meetings of clubs or organizations: Not on file    Relationship status: Not on file  . Intimate partner violence    Fear of current or ex partner: Not on file    Emotionally abused: Not on file    Physically abused: Not on file    Forced sexual activity: Not on file  Other Topics Concern  . Not on file  Social History Narrative   Lives at home alone.   Right-handed.   Drinks approximately 1 liter of Dr. Malachi Bonds per day.   Family History  Problem Relation Age of Onset  . Hypertension Mother   . Brain cancer Mother   . Lung cancer Mother   . Hypertension Brother   . Cervical cancer Sister   . Hypertension Father   . Cerebral aneurysm Father   . Colon cancer Neg Hx   . Esophageal cancer Neg Hx   . Inflammatory bowel disease Neg Hx   . Liver disease Neg Hx   . Pancreatic cancer Neg Hx   . Rectal cancer Neg Hx   . Stomach cancer Neg Hx    I have reviewed his medical,  social, and family history in detail and updated the electronic medical record as necessary.    PHYSICAL EXAMINATION  Telemedicine visit   REVIEW OF DATA  I reviewed the following data at the time of this encounter:  GI Procedures and Studies  June 2020 colonoscopy - Non-thrombosed external hemorrhoids found on digital rectal exam. - Tortuous colon. - One 40 mm polyp in the cecum, removed with mucosal resection. Resected and retrieved. Treated with a hot snare. Clips (MR conditional) were placed. - One 22 mm polyp in the ascending colon, removed with mucosal resection. Resected and retrieved. Clips (MR conditional) were placed. - One 15 mm polyp at the  recto-sigmoid colon, removed with a hot snare. Resected and retrieved. Clips (MR conditional) were placed. - Six 2 to 7 mm polyps in the rectum, in the sigmoid colon and in the ascending colon, removed with a cold snare. Resected and retrieved. - Three 7 to 12 mm polyps in the rectum, removed with a hot snare. Resected and retrieved. - Three 1 to 2 mm polyps in the sigmoid colon and in the ascending colon, removed with a cold biopsy forceps. Resected and retrieved. - Diverticulosis in the recto-sigmoid colon, in the sigmoid colon and in the descending colon. - Non-bleeding non-thrombosed external and internal hemorrhoids.  Laboratory Studies  Reviewed in EPIC  Imaging Studies  No relevant studies to review  ASSESSMENT  Mr. Geller is a 78 y.o. male  with a pmh significant for COPD, GERD, hyperlipidemia, hypertension, migraines, CAD (status post NSTEMI previously), history of prior CVA, reported mild aortic stenosis, anxiety, colon polyps (TAs).  The patient is seen today for evaluation and management of:  1. Cecal polyp   2. History of colonic polyps   3. Nausea without vomiting   4. Dysphagia, unspecified type   5. Vertigo    Today's visit was originally for follow-up post colonoscopy that the patient had requested.  However, patient also has other GI manifestations and symptoms that he wanted to describe.  I stated that these should be evaluated in more complete sense with Dr. Penelope Coop but the patient would like to move forward with a decision on some sort of work-up at this point in time.  We elected to move forward as will be noted below with a evaluation.  I asked the patient to also ensure that he follows up with Dr. Penelope Coop for further issues as he is his primary gastroenterologist at this point in time.  The patient symptoms which have been ongoing for nearly 2 years with vertiginous symptoms sounds as if this is something that is not GI related.  He does not really describe significant  postprandial abdominal pain or discomfort or nausea but nausea which is present all the time.  With that being said worthwhile for [place] to try and rule out H. pylori and we can do that by the patient abstaining from Protonix which she basically does at this point in time.  Patient could have underlying peptic ulcer disease though again his discomfort is not really present and he is not having postprandial pains.  If H. pylori is negative then may be the patient will benefit from truly being on PPI on a daily basis and see if that makes any difference for him though it may not.  Because of his issues of swallowing that he is described although likely a result of his previous cervical neck injury and issues we can begin a conservative work-up by performing a barium swallow.  Need for endoscopic evaluation and biopsies and dilation can be considered based on symptoms moving forward.  All patient questions were answered, to the best of my ability, and the patient agrees to the aforementioned plan of action with follow-up as indicated.   PLAN  Laboratories as outlined below to evaluate for persistent nausea H. pylori stool antigen (will be off PPI for at least 2 weeks prior to performing) If needed for heartburn can use Pepcid rather than Protonix on an as-needed basis Barium swallow to evaluate for dysphagia Endoscopic evaluation can be considered in future if necessary Follow-up with ENT - Dr. Benjamine Mola as had previously been discussed for further work-up of his vertiginous symptoms Patient should follow-up with Dr. Penelope Coop as well Colonoscopy with EMR follow-up in 6 months (patient already in the cue)   Orders Placed This Encounter  Procedures  . Helicobacter pylori special antigen  . DG ESOPHAGUS W DOUBLE CM (HD)  . Hepatic function panel  . TSH  . Cortisol    New Prescriptions   FAMOTIDINE (PEPCID) 20 MG TABLET    TAKE 1 TABLET BY MOUTH TWICE DAILY   Modified Medications   No medications on file     Planned Follow Up Return in about 4 weeks (around 08/14/2018).   Justice Britain, MD [facility] Office # [phone]

## 2018-07-20 ENCOUNTER — Encounter: Payer: Self-pay | Admitting: Gastroenterology

## 2018-07-20 DIAGNOSIS — R110 Nausea: Secondary | ICD-10-CM | POA: Insufficient documentation

## 2018-07-20 DIAGNOSIS — R42 Dizziness and giddiness: Secondary | ICD-10-CM | POA: Insufficient documentation

## 2018-07-20 DIAGNOSIS — R1310 Dysphagia, unspecified: Secondary | ICD-10-CM | POA: Insufficient documentation

## 2018-07-24 ENCOUNTER — Ambulatory Visit (HOSPITAL_COMMUNITY): Payer: Medicare Other

## 2018-07-27 ENCOUNTER — Telehealth: Payer: Self-pay | Admitting: Gastroenterology

## 2018-07-27 NOTE — Telephone Encounter (Signed)
The pt has been advised he has an appt with Dr Rush Landmark on 8/13.  He states he has continued nausea and was advised to also call Dr Penelope Coop per Dr Mansouraty's note dated 07/17/18.  The pt has been advised of the information and verbalized understanding.

## 2018-07-27 NOTE — Telephone Encounter (Signed)
Pt inquired whether he needs an OV or to schedule a procedure at the hospital. Pt reported that he is feeling very ill.

## 2018-08-15 ENCOUNTER — Ambulatory Visit: Payer: Medicare Other | Admitting: Gastroenterology

## 2018-08-23 ENCOUNTER — Encounter: Payer: Self-pay | Admitting: Gastroenterology

## 2018-08-23 ENCOUNTER — Ambulatory Visit (INDEPENDENT_AMBULATORY_CARE_PROVIDER_SITE_OTHER): Payer: Medicare Other | Admitting: Gastroenterology

## 2018-08-23 VITALS — BP 120/72 | HR 64 | Temp 98.5°F | Ht 64.75 in | Wt 183.5 lb

## 2018-08-23 DIAGNOSIS — R1310 Dysphagia, unspecified: Secondary | ICD-10-CM

## 2018-08-23 DIAGNOSIS — K219 Gastro-esophageal reflux disease without esophagitis: Secondary | ICD-10-CM

## 2018-08-23 DIAGNOSIS — R109 Unspecified abdominal pain: Secondary | ICD-10-CM

## 2018-08-23 DIAGNOSIS — R110 Nausea: Secondary | ICD-10-CM | POA: Diagnosis not present

## 2018-08-23 DIAGNOSIS — R42 Dizziness and giddiness: Secondary | ICD-10-CM

## 2018-08-23 NOTE — Patient Instructions (Addendum)
You have been scheduled for a Barium Esophogram at [facility] (1st floor of the hospital) on 08/27/2018 at 11:00am. Please arrive 15 minutes prior to your appointment for registration. Make certain not to have anything to eat or drink 3 hours prior to your test. If you need to reschedule for any reason, please contact radiology at [phone] to do so. __________________________________________________________________ A barium swallow is an examination that concentrates on views of the esophagus. This tends to be a double contrast exam (barium and two liquids which, when combined, create a gas to distend the wall of the oesophagus) or single contrast (non-ionic iodine based). The study is usually tailored to your symptoms so a good history is essential. Attention is paid during the study to the form, structure and configuration of the esophagus, looking for functional disorders (such as aspiration, dysphagia, achalasia, motility and reflux) EXAMINATION You may be asked to change into a gown, depending on the type of swallow being performed. A radiologist and radiographer will perform the procedure. The radiologist will advise you of the type of contrast selected for your procedure and direct you during the exam. You will be asked to stand, sit or lie in several different positions and to hold a small amount of fluid in your mouth before being asked to swallow while the imaging is performed .In some instances you may be asked to swallow barium coated marshmallows to assess the motility of a solid food bolus. The exam can be recorded as a digital or video fluoroscopy procedure. POST PROCEDURE It will take 1-2 days for the barium to pass through your system. To facilitate this, it is important, unless otherwise directed, to increase your fluids for the next 24-48hrs and to resume your normal diet.  This test typically takes about 30 minutes to  perform. __________________________________________________________________________________  Your provider has requested that you go to the basement level for lab work tomorrow. Press "B" on the elevator. The lab is located at the first door on the left as you exit the elevator. Please draw- Cortisol, TSH, Hepatic Function Panel, Lipase, and H. Pylori Special Antigen( stool).   Stop Protonix until you submit stool studies- then restart.  Start Pepcid 20-40mg  ( over the counter ) once daily for heartburn.  Okay to use 8mg  of Zofran during the day if needed for nausea.

## 2018-08-24 ENCOUNTER — Other Ambulatory Visit (INDEPENDENT_AMBULATORY_CARE_PROVIDER_SITE_OTHER): Payer: Medicare Other

## 2018-08-24 DIAGNOSIS — D120 Benign neoplasm of cecum: Secondary | ICD-10-CM | POA: Diagnosis not present

## 2018-08-24 DIAGNOSIS — R110 Nausea: Secondary | ICD-10-CM | POA: Diagnosis not present

## 2018-08-24 DIAGNOSIS — R42 Dizziness and giddiness: Secondary | ICD-10-CM | POA: Diagnosis not present

## 2018-08-24 DIAGNOSIS — R109 Unspecified abdominal pain: Secondary | ICD-10-CM

## 2018-08-24 DIAGNOSIS — Z86010 Personal history of colonic polyps: Secondary | ICD-10-CM

## 2018-08-24 DIAGNOSIS — R1310 Dysphagia, unspecified: Secondary | ICD-10-CM | POA: Diagnosis not present

## 2018-08-24 DIAGNOSIS — R112 Nausea with vomiting, unspecified: Secondary | ICD-10-CM | POA: Diagnosis not present

## 2018-08-24 DIAGNOSIS — K219 Gastro-esophageal reflux disease without esophagitis: Secondary | ICD-10-CM | POA: Diagnosis not present

## 2018-08-24 DIAGNOSIS — K635 Polyp of colon: Secondary | ICD-10-CM

## 2018-08-24 LAB — HEPATIC FUNCTION PANEL
ALT: 23 U/L (ref 0–53)
AST: 17 U/L (ref 0–37)
Albumin: 4 g/dL (ref 3.5–5.2)
Alkaline Phosphatase: 100 U/L (ref 39–117)
Bilirubin, Direct: 0.1 mg/dL (ref 0.0–0.3)
Total Bilirubin: 0.5 mg/dL (ref 0.2–1.2)
Total Protein: 6.3 g/dL (ref 6.0–8.3)

## 2018-08-24 LAB — CORTISOL: Cortisol, Plasma: 9.9 ug/dL

## 2018-08-24 LAB — LIPASE: Lipase: 14 U/L (ref 11.0–59.0)

## 2018-08-24 LAB — TSH: TSH: 1.48 u[IU]/mL (ref 0.35–4.50)

## 2018-08-26 ENCOUNTER — Encounter: Payer: Self-pay | Admitting: Gastroenterology

## 2018-08-26 DIAGNOSIS — R109 Unspecified abdominal pain: Secondary | ICD-10-CM | POA: Insufficient documentation

## 2018-08-26 DIAGNOSIS — K219 Gastro-esophageal reflux disease without esophagitis: Secondary | ICD-10-CM | POA: Insufficient documentation

## 2018-08-26 NOTE — Progress Notes (Signed)
[place] VISIT   Primary Care Provider Aura Dials, MD [place] [id number] [phone]  Patient Profile: Rick Church is a 78 y.o. male with a pmh significant for COPD, GERD, hyperlipidemia, hypertension, migraines, CAD (status post NSTEMI previously), history of prior CVA, reported mild aortic stenosis, anxiety, colon polyps (TAs).  The patient presents to the [facility] for an evaluation and management of problem(s) noted below:  Problem List 1. Nausea without vomiting   2. Dysphagia, unspecified type   3. Abdominal pain, unspecified abdominal location   4. Gastroesophageal reflux disease, esophagitis presence not specified   5. Vertigo     History of Present Illness Please see initial consultation note and progress note for full details of HPI.   Interval History Since her last visit, the patient has not followed up with Dr. Penelope Coop.  He also has not followed up with Dr. Benjamine Mola.  He canceled his barium swallow/upper GI series.  He did not obtain any of the labs that we had asked him to have performed.  However, he continues to have issues with vertigo and dizziness and nausea.  He is interested in moving forward with some evaluation at this time.  Patient symptoms really are unchanged from prior notation.  No significant vomiting.  No changes in his bowel habits.  He had a bout of bad GERD for which he took a PPI recently (within the last 2 days).  GI Review of Systems Positive as above Negative for pain, melena, hematochezia   Review of Systems General: Denies fevers/chills Cardiovascular: Denies chest pain Pulmonary: S OB at baseline Gastroenterological: See HPI Genitourinary: Denies darkened urine Hematological: Denies easy bruising Dermatological: Denies jaundice Psychological: Mood is stable   Medications Current Outpatient Medications  Medication Sig Dispense Refill  . acetaminophen (TYLENOL) 500  MG tablet Take 500 mg by mouth every 6 (six) hours as needed for moderate pain or headache.     Marland Kitchen amLODipine (NORVASC) 10 MG tablet Take 10 mg by mouth at bedtime. (2100)  1  . aspirin EC 81 MG tablet Take 81 mg by mouth at bedtime. (2100)    . butalbital-acetaminophen-caffeine (FIORICET, ESGIC) 50-325-40 MG tablet TAKE 1 TABLET BY MOUTH EVERY 6 HOURS FOR MIGRAINE (Patient taking differently: Take 1 tablet by mouth every 6 (six) hours as needed for migraine. ) 8 tablet 5  . CAMBIA 50 MG PACK TAKE AS NEEDED FOR SEVERE MIGRAINE (Patient taking differently: Take 1 each by mouth daily as needed (migraine). ) 9 each 11  . citalopram (CELEXA) 40 MG tablet Take 40 mg by mouth at bedtime.    . clonazePAM (KLONOPIN) 2 MG tablet Take 1 tablet (2 mg total) by mouth 2 (two) times daily as needed for anxiety. (Patient taking differently: Take 2 mg by mouth at bedtime. ) 30 tablet 0  . gabapentin (NEURONTIN) 300 MG capsule Take 1 capsule (300 mg total) by mouth 2 (two) times daily. 60 capsule 5  . ibuprofen (ADVIL,MOTRIN) 200 MG tablet Take 400 mg by mouth every 8 (eight) hours as needed for headache or moderate pain.     Marland Kitchen irbesartan (AVAPRO) 300 MG tablet Take 300 mg by mouth at bedtime. (2100)  2  . meclizine (ANTIVERT) 25 MG tablet Take 50-150 mg by mouth See admin instructions. Take 2-3 tablets by mouth scheduled in the morning & take 5-6 tablets by mouth scheduled at night; take 2-3 tablets if needed for additional dizziness or nausea.    Marland Kitchen  Multiple Vitamin (MULTIVITAMIN WITH MINERALS) TABS tablet Take 1 tablet by mouth at bedtime. (2100)    . ondansetron (ZOFRAN-ODT) 4 MG disintegrating tablet Take 8 mg by mouth daily.    . pantoprazole (PROTONIX) 40 MG tablet Take 1 tablet (40 mg total) by mouth daily. (Patient taking differently: Take 40 mg by mouth daily as needed (heartburn). ) 30 tablet 0  . pregabalin (LYRICA) 75 MG capsule TAKE ONE CAPSULE BY MOUTH THREE TIMES DAILY AS NEEDED FOR PAIN (Patient taking  differently: Take 75 mg by mouth 3 (three) times daily as needed (pain). ) 90 capsule 5  . simvastatin (ZOCOR) 80 MG tablet Take 40 mg by mouth at bedtime. (2100)    . traZODone (DESYREL) 100 MG tablet Take 100 mg by mouth at bedtime.   6   No current facility-administered medications for this visit.     Allergies Allergies  Allergen Reactions  . Atorvastatin Other (See Comments)    leg myalgias  . Indocin [Indomethacin] Nausea Only and Other (See Comments)    dizzy  . Penicillins Hives and Other (See Comments)    Tolerated a dose of CEFEPIME 12/31/13 Did it involve swelling of the face/tongue/throat, SOB, or low BP? No Did it involve sudden or severe rash/hives, skin peeling, or any reaction on the inside of your mouth or nose? Yes Did you need to seek medical attention at a hospital or doctor's office? No When did it last happen?1961 If all above answers are "NO", may proceed with cephalosporin use.   . Anesthesia S-I-60 Nausea And Vomiting    Severe vomiting with anesthesia   . Buspirone Other (See Comments)    UNSPECIFIED INTOLERANCE  . Restoril [Temazepam] Other (See Comments)    UNSPECIFIED INTOLERANCE  . Toprol Xl [Metoprolol Tartrate] Other (See Comments)    UNSPECIFIED REACTION    . Asa [Aspirin] Itching and Rash  . Codeine Nausea And Vomiting  . Hytrin [Terazosin] Nausea And Vomiting       . Neurontin [Gabapentin] Nausea And Vomiting       . Oxycodone Nausea Only and Other (See Comments)    "Deathly sick"  . Zestril [Lisinopril] Other (See Comments)    UNSPECIFIED SPECIFIC REACTION  Makes him feel really bad, doesn't feel like getting up in the morning     Histories Past Medical History:  Diagnosis Date  . Acute respiratory distress syndrome (ARDS) (HCC)   . Anxiety   . Aortic stenosis    mild AS 01/2014 echo  . Arthritis   . Complication of anesthesia   . COPD (chronic obstructive pulmonary disease) ([place])   . Ejection fraction   . GERD  (gastroesophageal reflux disease)   . Headache   . High cholesterol   . History of hiatal hernia   . Hx of adenomatous colonic polyps   . Hypertension   . Legionnaire's disease ([place])   . Migraine   . Neck pain   . NSTEMI (non-ST elevated myocardial infarction) ([place])    12/2013; cath non-obstructive CAD  . Panic attack   . Pneumonia 2015  . PONV (postoperative nausea and vomiting)    Vomitted after last endo precodure   . 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 Stroke ([place])    hx of x 3 - no residual  -- patient was unaware of CVA   Past Surgical History:  Procedure Laterality Date  . AMPUTATION Left 04/13/2018   Procedure: LEFT RING FINGERTIP REPAIR;  Surgeon: Milly Jakob, MD;  Location: [facility];  Service: Orthopedics;  Laterality: Left;  . BACK SURGERY  2000   fusion   . BIOPSY  12/13/2017   Procedure: BIOPSY;  Surgeon: Wonda Horner, MD;  Location: [facility];  Service: Endoscopy;;  . CARPAL TUNNEL RELEASE Bilateral   . CHOLECYSTECTOMY N/A 07/11/2016   Procedure: LAPAROSCOPIC CHOLECYSTECTOMY;  Surgeon: Georganna Skeans, MD;  Location: [facility];  Service: General;  Laterality: N/A;  . COLONOSCOPY WITH PROPOFOL N/A 12/13/2017   Procedure: COLONOSCOPY WITH PROPOFOL;  Surgeon: Wonda Horner, MD;  Location: [facility];  Service: Endoscopy;  Laterality: N/A;  . COLONOSCOPY WITH PROPOFOL N/A 06/25/2018   Procedure: COLONOSCOPY WITH PROPOFOL;  Surgeon: Rush Landmark Telford Nab., MD;  Location: [facility];  Service: Gastroenterology;  Laterality: N/A;  . ENDOSCOPIC MUCOSAL RESECTION N/A 06/25/2018   Procedure: ENDOSCOPIC MUCOSAL RESECTION;  Surgeon: Rush Landmark Telford Nab., MD;  Location: [facility];  Service: Gastroenterology;  Laterality: N/A;  . FOOT NEUROMA SURGERY Left 09/29/2101  . HEMOSTASIS CLIP PLACEMENT  06/25/2018   Procedure: HEMOSTASIS CLIP PLACEMENT;  Surgeon: Irving Copas., MD;  Location: [facility];  Service: Gastroenterology;;  . LEFT HEART CATH AND  CORONARY ANGIOGRAPHY N/A 06/07/2016   Procedure: Left Heart Cath and Coronary Angiography;  Surgeon: Adrian Prows, MD;  Location: [facility];  Service: Cardiovascular;  Laterality: N/A;  . LEFT HEART CATHETERIZATION WITH CORONARY ANGIOGRAM N/A 01/06/2014   Procedure: LEFT HEART CATHETERIZATION WITH CORONARY ANGIOGRAM;  Surgeon: Peter M Martinique, MD;  Location: [facility];  Service: Cardiovascular;  Laterality: N/A;  . LUMBAR FUSION    . NECK SURGERY    . POLYPECTOMY  12/13/2017   Procedure: POLYPECTOMY;  Surgeon: Wonda Horner, MD;  Location: [facility];  Service: Endoscopy;;  . POLYPECTOMY  06/25/2018   Procedure: POLYPECTOMY;  Surgeon: Irving Copas., MD;  Location: [facility];  Service: Gastroenterology;;  . Cherre Robins CUFF REPAIR Left   . SHOULDER SURGERY Right 1998, 2002  . SUBMUCOSAL LIFTING INJECTION  06/25/2018   Procedure: SUBMUCOSAL LIFTING INJECTION;  Surgeon: Rush Landmark Telford Nab., MD;  Location: [facility];  Service: Gastroenterology;;  . TONSILLECTOMY    . TRACHEOSTOMY     feinstein  . TRACHEOSTOMY CLOSURE     Social History   Socioeconomic History  . Marital status: Widowed    Spouse name: Not on file  . Number of children: 0  . Years of education: GED  . Highest education level: Not on file  Occupational History  . Occupation: Disabled  Social Needs  . Financial resource strain: Not on file  . Food insecurity    Worry: Not on file    Inability: Not on file  . Transportation needs    Medical: Not on file    Non-medical: Not on file  Tobacco Use  . Smoking status: Current Some Day Smoker    Packs/day: 1.50    Types: Cigarettes    Start date: 11/14/1956  . Smokeless tobacco: Former Systems developer    Types: Chew  Substance and Sexual Activity  . Alcohol use: No    Alcohol/week: 0.0 standard drinks  . Drug use: No  . Sexual activity: Not on file  Lifestyle  . Physical activity    Days per week: Not on file    Minutes per session: Not on file  .  Stress: Not on file  Relationships  . Social Herbalist on phone: Not on file    Gets together: Not on file    Attends  religious service: Not on file    Active member of club or organization: Not on file    Attends meetings of clubs or organizations: Not on file    Relationship status: Not on file  . Intimate partner violence    Fear of current or ex partner: Not on file    Emotionally abused: Not on file    Physically abused: Not on file    Forced sexual activity: Not on file  Other Topics Concern  . Not on file  Social History Narrative   Lives at home alone.   Right-handed.   Drinks approximately 1 liter of Dr. Malachi Bonds per day.   Family History  Problem Relation Age of Onset  . Hypertension Mother   . Brain cancer Mother   . Lung cancer Mother   . Hypertension Brother   . Cervical cancer Sister   . Hypertension Father   . Cerebral aneurysm Father   . Colon cancer Neg Hx   . Esophageal cancer Neg Hx   . Inflammatory bowel disease Neg Hx   . Liver disease Neg Hx   . Pancreatic cancer Neg Hx   . Rectal cancer Neg Hx   . Stomach cancer Neg Hx    I have reviewed his medical, social, and family history in detail and updated the electronic medical record as necessary.    PHYSICAL EXAMINATION  BP 120/72 (BP Location: Left Arm, Patient Position: Sitting, Cuff Size: Normal)   Pulse 64   Temp 98.5 F (36.9 C)   Ht 5' 4.75" (1.645 m) Comment: height measured without shoes  Wt 183 lb 8 oz (83.2 kg)   BMI 30.77 kg/m  GEN: NAD, appears stated age, doesn't appear chronically ill PSYCH: Cooperative, without pressured speech EYE: Conjunctivae pink, sclerae anicteric ENT: MMM, without oral ulcers CV: RR without R/Gs  RESP: Without adventitious breath sounds present GI: NABS, soft, NT/ND, without rebound or guarding MSK/EXT: Trace lower extremity edema SKIN: No jaundice NEURO:  Alert & Oriented x 3, no focal deficits   REVIEW OF DATA  I reviewed the following  data at the time of this encounter:  GI Procedures and Studies  Previously reviewed  Laboratory Studies  Reviewed those in EPIC (no new labs since last telemedicine visit)  Imaging Studies  No relevant studies to review   ASSESSMENT  Mr. Cortez is a 78 y.o. male  with a pmh significant for COPD, GERD, hyperlipidemia, hypertension, migraines, CAD (status post NSTEMI previously), history of prior CVA, reported mild aortic stenosis, anxiety, colon polyps (TAs).  The patient is seen today for evaluation and management of:  1. Nausea without vomiting   2. Dysphagia, unspecified type   3. Abdominal pain, unspecified abdominal location   4. Gastroesophageal reflux disease, esophagitis presence not specified   5. Vertigo    Patient remained stable from our last telemedicine visit.  Nothing dramatically has changed in regards to his symptoms.  I suspect his ongoing symptoms are likely a result of an ENT pathology and his vertiginous symptoms need further evaluation and follow-up with ENT.  We will obtain laboratories that were previously ordered and held as well as H. pylori stool antigen (once he has been off PPI for 2 weeks).  We will proceed with his barium upper GI series to evaluate his esophagus.  Things persists may consider an upper endoscopy to rule out PUD.  All patient questions were answered, to the best of my ability, and the patient agrees to the aforementioned plan  of action with follow-up as indicated.   PLAN  Proceed with obtaining laboratories as previously outlined to evaluate for persistent nausea H. pylori stool antigen (needs to be off PPI for 2 weeks) -Pepcid can be used in the interim if significant GERD Barium swallow for dysphagia evaluation We will consider possible endoscopy in future Follow-up with ENT - Dr. Benjamine Mola for further discussion of vertiginous symptoms Follow-up with Dr. Penelope Coop Colonoscopy with EMR in 5 months (patient already cue)   Orders Placed This  Encounter  Procedures  . Lipase    New Prescriptions   No medications on file   Modified Medications   No medications on file    Planned Follow Up No follow-ups on file.   Justice Britain, MD [facility] Office # [phone]

## 2018-08-27 ENCOUNTER — Telehealth: Payer: Self-pay | Admitting: Gastroenterology

## 2018-08-27 ENCOUNTER — Other Ambulatory Visit: Payer: Self-pay

## 2018-08-27 ENCOUNTER — Ambulatory Visit (HOSPITAL_COMMUNITY)
Admission: RE | Admit: 2018-08-27 | Discharge: 2018-08-27 | Disposition: A | Discharge status: Home / Self Care | Payer: Medicare Other | Source: Ambulatory Visit | Attending: Gastroenterology | Admitting: Gastroenterology

## 2018-08-27 DIAGNOSIS — R42 Dizziness and giddiness: Secondary | ICD-10-CM | POA: Insufficient documentation

## 2018-08-27 DIAGNOSIS — R110 Nausea: Secondary | ICD-10-CM | POA: Insufficient documentation

## 2018-08-27 DIAGNOSIS — R1310 Dysphagia, unspecified: Secondary | ICD-10-CM | POA: Diagnosis present

## 2018-08-27 DIAGNOSIS — Z86010 Personal history of colonic polyps: Secondary | ICD-10-CM | POA: Insufficient documentation

## 2018-08-27 DIAGNOSIS — D120 Benign neoplasm of cecum: Secondary | ICD-10-CM | POA: Diagnosis not present

## 2018-08-27 DIAGNOSIS — K635 Polyp of colon: Secondary | ICD-10-CM

## 2018-08-27 NOTE — Telephone Encounter (Signed)
Returned pt calls.

## 2018-09-13 ENCOUNTER — Telehealth: Payer: Self-pay | Admitting: Gastroenterology

## 2018-09-13 NOTE — Telephone Encounter (Signed)
Pt stated that he had "something done in the hospital and would like to know the results."

## 2018-09-14 NOTE — Telephone Encounter (Signed)
Left message on machine to call back  

## 2018-09-14 NOTE — Telephone Encounter (Signed)
The pt was given the information sent via My Chart.  He will follow up with ENT and have note sent to our office for review.

## 2018-09-18 ENCOUNTER — Other Ambulatory Visit: Payer: Self-pay | Admitting: Neurology

## 2018-09-20 ENCOUNTER — Telehealth: Payer: Self-pay | Admitting: *Deleted

## 2018-09-20 NOTE — Telephone Encounter (Signed)
Have paper script for Fioricet from Dr. Tomi Likens. Called patient and left message which pharmacy he would like it faxed to. Awaiting return call then will fax.

## 2018-09-21 ENCOUNTER — Telehealth: Payer: Self-pay | Admitting: Neurology

## 2018-09-21 ENCOUNTER — Telehealth: Payer: Self-pay | Admitting: *Deleted

## 2018-09-21 NOTE — Telephone Encounter (Signed)
Faxed script per patient request to [facility]. [employer], advertising script). Called and left patient message that it was faxed. Fax confirmation received.

## 2018-09-21 NOTE — Telephone Encounter (Signed)
Patient states that we needed to know which pharmacy to send his medication to. He would like to called into the [facility] on [address]

## 2018-09-21 NOTE — Telephone Encounter (Signed)
Opened in error

## 2018-12-03 NOTE — Progress Notes (Signed)
Virtual Visit via Video Note The purpose of this virtual visit is to provide medical care while limiting exposure to the novel coronavirus.    Consent was obtained for video visit:  Yes.   Answered questions that patient had about telehealth interaction:  Yes.   I discussed the limitations, risks, security and privacy concerns of performing an evaluation and management service by telemedicine. I also discussed with the patient that there may be a patient responsible charge related to this service. The patient expressed understanding and agreed to proceed.  Pt location: Home Physician Location: office Name of referring provider:  Wonda Horner, MD I connected with Rick Church at patients initiation/request on 12/04/2018 at 12:50 PM EST by video enabled telemedicine application and verified that I am speaking with the correct person using two identifiers. Pt MRN:  [id number] Pt DOB:  [birth year] Video Participants:  Rick Church   History of Present Illness:  Rick Church is a 78 year old male with COPD, HTN, CAD s/p NSTEMI, arthritis and history of Legionnaire's pneumonia who follows up for dizziness and migraines.   UPDATE: Dizziness is still an issue.  When he turns his head to the left, he notes moderate spinning and ringing in the ears.  It lasts as long as he keeps his head turned left.  It does not occur with any other neck or head position.  It doesn't happen every time he turns his head left but it occurs 2 or 3 times a day.  He takes meclizine and Zofran for episodes.    HISTORY: Since January or February 2018, he has not been feeling well. He reported onset of dizziness. He has prior history of vertigo, but this was different. He described this dizziness as feeling "drunk". There was a sense of movement but not spinning. It was not positional. He also endorsed nausea and abdominal discomfort. He was found to have gallstones. After they were removed in early July,  the dizziness and nausea became worse. Prior to surgery, they were episodic, where he would have 3 or 4 days of no dizziness at a time. After the surgery, it was constant. He has had falls due to unsteadiness. He sometimes noted sunspots and double vision. He denied hearing loss but has chronic tinnitus. He denied slurred speech, facial droop or unilateral numbness or weakness. Meclizine was ineffective. Zofran did not help with nausea. He tried the Epley maneuver, which helped with his previous vertigo, but it was ineffective. CT of head from 09/04/16 was personally reviewed and revealed signs of cerebrovascular disease but no acute findings or mass lesions. He takes Lyrica once daily for neuralgia. In September 2018, he started taking it 3 times daily (as originally prescribed) and the dizziness and nausea resolved.It subsequently returned. He later was started on gabapentin 300mg  at bedtime and underwent vestibular rehab and dizziness again resolved. CTA of head was performed on 10/11/16, which was personally reviewed and revealed no significant intracranial stenosis or occlusion, including the vertebrobasilar system.   He has significant stroke risk factors. He has prior strokes. He is a smoker. He has hypertension and CAD. He takes ASA 81mg  daily.  He also has longstanding history of migrainessince the early 1960s, described as severe and pounding, beginning from back of neck and involving the front of his head. Associated nausea.  They would occur daily. Stress is trigger.  Fioricet helps relieve it.Past medication includes nortriptyline. Cambia was effective in the past.  Past Medical History:  Past Medical History:  Diagnosis Date   Acute respiratory distress syndrome (ARDS) (HCC)    Anxiety    Aortic stenosis    mild AS 01/2014 echo   Arthritis    Complication of anesthesia    COPD (chronic obstructive pulmonary disease) (HCC)    Ejection fraction    GERD  (gastroesophageal reflux disease)    Headache    High cholesterol    History of hiatal hernia    Hx of adenomatous colonic polyps    Hypertension    Legionnaire's disease ([place])    Migraine    Neck pain    NSTEMI (non-ST elevated myocardial infarction) ([place])    12/2013; cath non-obstructive CAD   Panic attack    Pneumonia 2015   PONV (postoperative nausea and vomiting)    Vomitted after last endo precodure    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     amLODipine (NORVASC) 10 MG tablet Take 10 mg by mouth at bedtime. (2100)    aspirin EC 81 MG tablet Take 81 mg by mouth at bedtime. (2100)    butalbital-acetaminophen-caffeine (FIORICET) 50-325-40 MG tablet Take 1 tablet by mouth every 6 (six) hours as needed for migraine.    CAMBIA 50 MG PACK TAKE AS NEEDED FOR SEVERE MIGRAINE (Patient taking differently: Take 1 each by mouth daily as needed (migraine). )    citalopram (CELEXA) 40 MG tablet Take 40 mg by mouth at bedtime.    clonazePAM (KLONOPIN) 2 MG tablet Take 1 tablet (2 mg total) by mouth 2 (two) times daily as needed for anxiety. (Patient taking differently: Take 2 mg by mouth at bedtime. )    gabapentin (NEURONTIN) 300 MG capsule Take 1 capsule (300 mg total) by mouth 2 (two) times daily.    ibuprofen (ADVIL,MOTRIN) 200 MG tablet Take 400 mg by mouth every 8 (eight) hours as needed for headache or moderate pain.     irbesartan (AVAPRO) 300 MG tablet Take 300 mg by mouth at bedtime. (2100)    meclizine (ANTIVERT) 25 MG tablet Take 50-150 mg by mouth See admin instructions. Take 2-3 tablets by mouth scheduled in the morning & take 5-6 tablets by mouth scheduled at night; take 2-3 tablets if needed  for additional dizziness or nausea.    Multiple Vitamin (MULTIVITAMIN WITH MINERALS) TABS tablet Take 1 tablet by mouth at bedtime. (2100)    ondansetron (ZOFRAN-ODT) 4 MG disintegrating tablet Take 8 mg by mouth daily.    pantoprazole (PROTONIX) 40 MG tablet Take 1 tablet (40 mg total) by mouth daily. (Patient taking differently: Take 40 mg by mouth daily as needed (heartburn). )    pregabalin (LYRICA) 75 MG capsule TAKE ONE CAPSULE BY MOUTH THREE TIMES DAILY AS NEEDED FOR PAIN (Patient taking differently: Take 75 mg by mouth 3 (three) times daily as needed (pain). )    simvastatin (ZOCOR) 80 MG tablet Take 40 mg by mouth at bedtime. (2100) 09/04/2016: @ 2100   traZODone (DESYREL) 100 MG tablet Take 100 mg by mouth at bedtime.     No facility-administered encounter medications on file as of 12/04/2018.     Allergies: Allergies  Allergen Reactions  Atorvastatin Other (See Comments)    leg myalgias   Indocin [Indomethacin] Nausea Only and Other (See Comments)    dizzy   Penicillins Hives and Other (See Comments)    Tolerated a dose of CEFEPIME 12/31/13 Did it involve swelling of the face/tongue/throat, SOB, or low BP? No Did it involve sudden or severe rash/hives, skin peeling, or any reaction on the inside of your mouth or nose? Yes Did you need to seek medical attention at a hospital or doctor's office? No When did it last happen?1961 If all above answers are NO, may proceed with cephalosporin use.    Buspirone Other (See Comments)    UNSPECIFIED INTOLERANCE   Propofol Nausea And Vomiting    Severe vomiting with anesthesia    Restoril [Temazepam] Other (See Comments)    UNSPECIFIED INTOLERANCE   Toprol Xl [Metoprolol Tartrate] Other (See Comments)    UNSPECIFIED REACTION     Asa [Aspirin] Itching and Rash   Codeine Nausea And Vomiting   Hytrin [Terazosin] Nausea And Vomiting        Neurontin [Gabapentin] Nausea And Vomiting        Oxycodone Nausea  Only and Other (See Comments)    "Deathly sick"   Zestril [Lisinopril] Other (See Comments)    UNSPECIFIED SPECIFIC REACTION  Makes him feel really bad, doesn't feel like getting up in the morning     Family History: Family History  Problem Relation Age of Onset   Hypertension Mother    Brain cancer Mother    Lung cancer Mother    Hypertension Brother    Cervical cancer Sister    Hypertension Father    Cerebral aneurysm Father    Colon cancer Neg Hx    Esophageal cancer Neg Hx    Inflammatory bowel disease Neg Hx    Liver disease Neg Hx    Pancreatic cancer Neg Hx    Rectal cancer Neg Hx    Stomach cancer Neg Hx     Social History: Social History   Socioeconomic History   Marital status: Widowed    Spouse name: Not on file   Number of children: 0   Years of education: GED   Highest education level: GED or equivalent  Occupational History   Occupation: Disabled  Scientist, product/process development strain: Not on file   Food insecurity    Worry: Not on file    Inability: Not on file   Transportation needs    Medical: Not on file    Non-medical: Not on file  Tobacco Use   Smoking status: Current Some Day Smoker    Packs/day: 1.50    Types: Cigarettes    Start date: 11/14/1956   Smokeless tobacco: Former Systems developer    Types: Chew  Substance and Sexual Activity   Alcohol use: No    Alcohol/week: 0.0 standard drinks   Drug use: No   Sexual activity: Not on file  Lifestyle   Physical activity    Days per week: Not on file    Minutes per session: Not on file   Stress: Not on file  Relationships   Social connections    Talks on phone: Not on file    Gets together: Not on file    Attends religious service: Not on file    Active member of club or organization: Not on file    Attends meetings of clubs or organizations: Not on file    Relationship status: Not on file  Intimate partner violence    Fear of current or ex partner: Not on  file    Emotionally abused: Not on file    Physically abused: Not on file    Forced sexual activity: Not on file  Other Topics Concern   Not on file  Social History Narrative   Lives at home alone.   Right-handed.   Drinks approximately 1 liter of Dr. Malachi Bonds per day.    Observations/Objective:   Height 5\' 8"  (1.727 m), weight 180 lb (81.6 kg). No acute distress.  Alert and oriented.  Speech fluent and not dysarthric.  Language intact.   Face symmetric.  Assessment and Plan:   Dizziness/vertigo, triggered by neck movement to the left.  Vestibular rehab ineffective.  Prior CTA of head negative for vertebrobasilar insufficiency.  Would like to rule out cervicogenic component that may be compressing the vertebral artery.  MRI of cervical spine without contrast Carotid doppler Further recommendations pending results. Otherwise, follow up in 6 months.  Follow Up Instructions:    -I discussed the assessment and treatment plan with the patient. The patient was provided an opportunity to ask questions and all were answered. The patient agreed with the plan and demonstrated an understanding of the instructions.   The patient was advised to call back or seek an in-person evaluation if the symptoms worsen or if the condition fails to improve as anticipated.  Time spent with patient:  15 minutes.   Dudley Major, DO

## 2018-12-04 ENCOUNTER — Telehealth (INDEPENDENT_AMBULATORY_CARE_PROVIDER_SITE_OTHER): Payer: Medicare Other | Admitting: Neurology

## 2018-12-04 ENCOUNTER — Other Ambulatory Visit: Payer: Self-pay

## 2018-12-04 ENCOUNTER — Encounter: Payer: Self-pay | Admitting: Neurology

## 2018-12-04 VITALS — Ht 68.0 in | Wt 180.0 lb

## 2018-12-04 DIAGNOSIS — R42 Dizziness and giddiness: Secondary | ICD-10-CM

## 2018-12-04 NOTE — Addendum Note (Signed)
Addended by: Ranae Plumber on: 12/04/2018 02:50 PM   Modules accepted: Orders

## 2018-12-14 ENCOUNTER — Ambulatory Visit (HOSPITAL_COMMUNITY): Payer: Medicare Other

## 2018-12-19 ENCOUNTER — Other Ambulatory Visit: Payer: Self-pay | Admitting: Neurology

## 2018-12-26 ENCOUNTER — Other Ambulatory Visit: Payer: Medicare Other

## 2019-01-24 ENCOUNTER — Other Ambulatory Visit: Payer: Self-pay | Admitting: Neurological Surgery

## 2019-01-24 DIAGNOSIS — M47816 Spondylosis without myelopathy or radiculopathy, lumbar region: Secondary | ICD-10-CM

## 2019-02-15 ENCOUNTER — Other Ambulatory Visit: Payer: Self-pay

## 2019-02-15 ENCOUNTER — Emergency Department (HOSPITAL_COMMUNITY): Payer: Medicare Other

## 2019-02-15 ENCOUNTER — Encounter (HOSPITAL_COMMUNITY): Payer: Self-pay

## 2019-02-15 ENCOUNTER — Emergency Department (HOSPITAL_COMMUNITY)
Admission: EM | Admit: 2019-02-15 | Discharge: 2019-02-15 | Disposition: A | Discharge status: Home / Self Care | Payer: Medicare Other | Source: Home / Self Care | Attending: Emergency Medicine | Admitting: Emergency Medicine

## 2019-02-15 DIAGNOSIS — J9601 Acute respiratory failure with hypoxia: Secondary | ICD-10-CM

## 2019-02-15 DIAGNOSIS — R0609 Other forms of dyspnea: Secondary | ICD-10-CM | POA: Insufficient documentation

## 2019-02-15 DIAGNOSIS — R0902 Hypoxemia: Secondary | ICD-10-CM | POA: Insufficient documentation

## 2019-02-15 DIAGNOSIS — J9621 Acute and chronic respiratory failure with hypoxia: Secondary | ICD-10-CM | POA: Diagnosis not present

## 2019-02-15 DIAGNOSIS — J449 Chronic obstructive pulmonary disease, unspecified: Secondary | ICD-10-CM | POA: Insufficient documentation

## 2019-02-15 DIAGNOSIS — Z720 Tobacco use: Secondary | ICD-10-CM | POA: Insufficient documentation

## 2019-02-15 DIAGNOSIS — I252 Old myocardial infarction: Secondary | ICD-10-CM | POA: Insufficient documentation

## 2019-02-15 DIAGNOSIS — Z5320 Procedure and treatment not carried out because of patient's decision for unspecified reasons: Secondary | ICD-10-CM | POA: Insufficient documentation

## 2019-02-15 LAB — BRAIN NATRIURETIC PEPTIDE: B Natriuretic Peptide: 218.1 pg/mL — ABNORMAL HIGH (ref 0.0–100.0)

## 2019-02-15 LAB — LACTIC ACID, PLASMA: Lactic Acid, Venous: 1.3 mmol/L (ref 0.5–1.9)

## 2019-02-15 MED ORDER — ONDANSETRON HCL 4 MG/2ML IJ SOLN
4.0000 mg | Freq: Once | INTRAMUSCULAR | Status: AC
  Administered 2019-02-15: 04:00:00 4 mg via INTRAVENOUS
  Filled 2019-02-15: qty 2

## 2019-02-15 MED ORDER — DEXAMETHASONE SODIUM PHOSPHATE 10 MG/ML IJ SOLN
6.0000 mg | Freq: Once | INTRAMUSCULAR | Status: AC
  Administered 2019-02-15: 04:00:00 6 mg via INTRAVENOUS
  Filled 2019-02-15: qty 1

## 2019-02-15 NOTE — ED Provider Notes (Signed)
[facility] Emergency Department Provider Note MRN:  [id number]  Arrival date & time: 02/15/19     Chief Complaint   Respiratory Distress   History of Present Illness   Rick Church is a 79 y.o. year-old male with a history of aortic stenosis, COPD presenting to the ED with chief complaint of respiratory distress.  1 or 2 months of gradual onset shortness of breath, much worse over the past 2 days.  Explains that he feels terrible.  Denies cold-like symptoms, no fever, no cough, no chest pain, no abdominal pain.  Shortness of breath is moderate to severe, constant.  Worse with exertion.  Denies sick contacts.  Review of Systems  A complete 10 system review of systems was obtained and all systems are negative except as noted in the HPI and PMH.   Patient's Health History    Past Medical History:  Diagnosis Date  . Acute respiratory distress syndrome (ARDS) (HCC)   . Anxiety   . Aortic stenosis    mild AS 01/2014 echo  . Arthritis   . Complication of anesthesia   . COPD (chronic obstructive pulmonary disease) ([place])   . Ejection fraction   . GERD (gastroesophageal reflux disease)   . Headache   . High cholesterol   . History of hiatal hernia   . Hx of adenomatous colonic polyps   . Hypertension   . Legionnaire's disease ([place])   . Migraine   . Neck pain   . NSTEMI (non-ST elevated myocardial infarction) ([place])    12/2013; cath non-obstructive CAD  . Panic attack   . Pneumonia 2015  . PONV (postoperative nausea and vomiting)    Vomitted after last endo precodure   . 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 Stroke ([place])    hx of x 3 - no residual  -- patient was unaware of CVA    Past Surgical History:  Procedure Laterality Date  . AMPUTATION Left 04/13/2018   Procedure: LEFT RING FINGERTIP REPAIR;  Surgeon: Milly Jakob, MD;  Location: [place];  Service: Orthopedics;  Laterality: Left;  . BACK SURGERY  2000   fusion   . BIOPSY  12/13/2017   Procedure: BIOPSY;  Surgeon: Wonda Horner, MD;  Location: [facility];  Service: Endoscopy;;  . CARPAL TUNNEL RELEASE Bilateral   . CHOLECYSTECTOMY N/A 07/11/2016   Procedure: LAPAROSCOPIC CHOLECYSTECTOMY;  Surgeon: Georganna Skeans, MD;  Location: [facility];  Service: General;  Laterality: N/A;  . COLONOSCOPY WITH PROPOFOL N/A 12/13/2017   Procedure: COLONOSCOPY WITH PROPOFOL;  Surgeon: Wonda Horner, MD;  Location: [facility];  Service: Endoscopy;  Laterality: N/A;  . COLONOSCOPY WITH PROPOFOL N/A 06/25/2018   Procedure: COLONOSCOPY WITH PROPOFOL;  Surgeon: Rush Landmark Telford Nab., MD;  Location: [facility];  Service: Gastroenterology;  Laterality: N/A;  . ENDOSCOPIC MUCOSAL RESECTION N/A 06/25/2018   Procedure: ENDOSCOPIC MUCOSAL RESECTION;  Surgeon: Rush Landmark Telford Nab., MD;  Location: [facility];  Service: Gastroenterology;  Laterality: N/A;  . FOOT NEUROMA SURGERY Left 09/29/2101  . HEMOSTASIS CLIP PLACEMENT  06/25/2018   Procedure: HEMOSTASIS CLIP PLACEMENT;  Surgeon: Irving Copas., MD;  Location: [place] [facility];  Service: Gastroenterology;;  . LEFT HEART CATH AND CORONARY ANGIOGRAPHY N/A 06/07/2016   Procedure: Left Heart Cath and Coronary Angiography;  Surgeon: Adrian Prows, MD;  Location: [facility];  Service: Cardiovascular;  Laterality: N/A;  . LEFT HEART CATHETERIZATION WITH CORONARY ANGIOGRAM N/A 01/06/2014   Procedure: LEFT HEART CATHETERIZATION WITH CORONARY ANGIOGRAM;  Surgeon: Peter M Martinique, MD;  Location: [facility];  Service: Cardiovascular;  Laterality: N/A;  . LUMBAR FUSION    . NECK SURGERY    . POLYPECTOMY  12/13/2017   Procedure: POLYPECTOMY;  Surgeon: Wonda Horner, MD;  Location: [facility];  Service: Endoscopy;;  . POLYPECTOMY  06/25/2018   Procedure: POLYPECTOMY;  Surgeon: Irving Copas., MD;  Location: [facility];  Service: Gastroenterology;;  . Cherre Robins CUFF REPAIR Left   . SHOULDER SURGERY Right 1998, 2002  . SUBMUCOSAL LIFTING  INJECTION  06/25/2018   Procedure: SUBMUCOSAL LIFTING INJECTION;  Surgeon: Rush Landmark Telford Nab., MD;  Location: [place];  Service: Gastroenterology;;  . TONSILLECTOMY    . TRACHEOSTOMY     feinstein  . TRACHEOSTOMY CLOSURE      Family History  Problem Relation Age of Onset  . Hypertension Mother   . Brain cancer Mother   . Lung cancer Mother   . Hypertension Brother   . Cervical cancer Sister   . Hypertension Father   . Cerebral aneurysm Father   . Colon cancer Neg Hx   . Esophageal cancer Neg Hx   . Inflammatory bowel disease Neg Hx   . Liver disease Neg Hx   . Pancreatic cancer Neg Hx   . Rectal cancer Neg Hx   . Stomach cancer Neg Hx     Social History   Socioeconomic History  . Marital status: Widowed    Spouse name: Not on file  . Number of children: 0  . Years of education: GED  . Highest education level: GED or equivalent  Occupational History  . Occupation: Disabled  Tobacco Use  . Smoking status: Current Some Day Smoker    Packs/day: 1.50    Types: Cigarettes    Start date: 11/14/1956  . Smokeless tobacco: Former Systems developer    Types: Chew  Substance and Sexual Activity  . Alcohol use: No    Alcohol/week: 0.0 standard drinks  . Drug use: No  . Sexual activity: Not on file  Other Topics Concern  . Not on file  Social History Narrative   Lives at home alone.   Right-handed.   Drinks approximately 1 liter of Dr. Malachi Bonds per day.   Social Determinants of Health   Financial Resource Strain:   . Difficulty of Paying Living Expenses: Not on file  Food Insecurity:   . Worried About Charity fundraiser in the Last Year: Not on file  . Ran Out of Food in the Last Year: Not on file  Transportation Needs:   . Lack of Transportation (Medical): Not on file  . Lack of Transportation (Non-Medical): Not on file  Physical Activity:   . Days of Exercise per Week: Not on file  . Minutes of Exercise per Session: Not on file  Stress:   . Feeling of Stress : Not on  file  Social Connections:   . Frequency of Communication with Friends and Family: Not on file  . Frequency of Social Gatherings with Friends and Family: Not on file  . Attends Religious Services: Not on file  . Active Member of Clubs or Organizations: Not on file  . Attends Archivist Meetings: Not on file  . Marital Status: Not on file  Intimate Partner Violence:   . Fear of Current or Ex-Partner: Not on file  . Emotionally Abused: Not on file  . Physically Abused: Not on file  . Sexually Abused: Not on file     Physical  Exam   Vitals:   02/15/19 0351 02/15/19 0400  BP: 130/70 127/77  Pulse: (!) 56 (!) 55  Resp: 19 14  Temp:    SpO2: 93% 94%    CONSTITUTIONAL: Well-appearing, NAD NEURO:  Alert and oriented x 3, no focal deficits EYES:  eyes equal and reactive ENT/NECK:  no LAD, no JVD CARDIO: Regular rate, well-perfused, normal S1 and S2 PULM:  CTAB no wheezing or rhonchi GI/GU:  normal bowel sounds, non-distended, non-tender MSK/SPINE:  No gross deformities, 2+ pitting edema to bilateral lower extremities SKIN:  no rash, atraumatic PSYCH:  Appropriate speech and behavior  *Additional and/or pertinent findings included in MDM below  Diagnostic and Interventional Summary    EKG Interpretation  Date/Time:  Friday February 15 2019 03:05:43 EST Ventricular Rate:  60 PR Interval:    QRS Duration: 98 QT Interval:  448 QTC Calculation: 448 R Axis:   -105 Text Interpretation: Sinus rhythm Atrial premature complex Prolonged PR interval Left atrial enlargement Left anterior fascicular block Anterior infarct, old Minimal ST depression Baseline wander in lead(s) V2 Confirmed by Gerlene Fee [id number]) on 02/15/2019 3:19:25 AM      Cardiac Monitoring Interpretation:  Labs Reviewed  CULTURE, BLOOD (ROUTINE X 2)  CULTURE, BLOOD (ROUTINE X 2)  CBC  BASIC METABOLIC PANEL  BRAIN NATRIURETIC PEPTIDE  LACTIC ACID, PLASMA  LACTIC ACID, PLASMA  D-DIMER, QUANTITATIVE  (NOT AT [facility])  PROCALCITONIN  LACTATE DEHYDROGENASE  FERRITIN  FIBRINOGEN  C-REACTIVE PROTEIN  HEPATIC FUNCTION PANEL  TRIGLYCERIDES  POC SARS CORONAVIRUS 2 AG -  ED    DG Chest Portable 1 View  Final Result      Medications  dexamethasone (DECADRON) injection 6 mg (6 mg Intravenous Given 02/15/19 0337)  ondansetron (ZOFRAN) injection 4 mg (4 mg Intravenous Given 02/15/19 0357)     Procedures  /  Critical Care .Critical Care Performed by: Maudie Flakes, MD Authorized by: Maudie Flakes, MD   Critical care provider statement:    Critical care time (minutes):  35   Critical care was necessary to treat or prevent imminent or life-threatening deterioration of the following conditions:  Respiratory failure   Critical care was time spent personally by me on the following activities:  Discussions with consultants, evaluation of patient's response to treatment, examination of patient, ordering and performing treatments and interventions, ordering and review of laboratory studies, ordering and review of radiographic studies, pulse oximetry, re-evaluation of patient's condition, obtaining history from patient or surrogate and review of old charts    ED Course and Medical Decision Making  I have reviewed the triage vital signs, the nursing notes, and pertinent available records from the EMR.  Pertinent labs & imaging results that were available during my care of the patient were reviewed by me and considered in my medical decision making (see below for details).     Hypoxic respiratory failure, question COVID-19 versus CHF exacerbation versus COPD exacerbation.  No chest pain, EKG without ischemic features.  Awaiting Covid test, chest x-ray, labs.  Respiratory therapy consulted to start patient on high flow nasal cannula given his profound hypoxia.  4:30 AM update: Patient became very impatient with being in the emergency department and is demanding to leave.  Patient is alert and oriented  and with full capacity to make decisions.  I made it very clear to the patient that he has a life-threatening medical condition, namely respiratory failure still of unclear cause given the incomplete work-up.  I told him that  if he left the hospital there is a strong possibility that he will die.  He does not care about this, he still desires to leave.  With witnesses present I urged the gentleman to stay, to no avail.  Left AMA.  Barth Kirks. Sedonia Small, MD [place] [email or web address]  Final Clinical Impressions(s) / ED Diagnoses     ICD-10-CM   1. Acute respiratory failure with hypoxia (HCC)  J96.01     ED Discharge Orders    None       Discharge Instructions Discussed with and Provided to Patient:     Discharge Instructions     You are leaving the hospital [place].  You have a serious and life-threatening medical condition.  Please come back to the emergency department at anytime for continued care.       Maudie Flakes, MD 02/15/19 [phone]

## 2019-02-15 NOTE — ED Notes (Signed)
RT at bedside pt. Placed on HFNC 10 L

## 2019-02-15 NOTE — ED Triage Notes (Signed)
Pt states that SOB started on Monday, worse tonight, denies cough, oxygen 72% on arrival, placed on NRB, hx of COPD

## 2019-02-15 NOTE — Discharge Instructions (Addendum)
You are leaving the hospital [place].  You have a serious and life-threatening medical condition.  Please come back to the emergency department at anytime for continued care.

## 2019-02-15 NOTE — ED Notes (Signed)
Pt. Stated to registration he would "like to get the hell out of here."   RN check on pt's concern. Updated pt pending labs and requires continuous oxygen supplement. Pt states he "doesn't give a shit." RN informed pt she would talk MD.   Dr. Sedonia Small informed and at bedside

## 2019-02-15 NOTE — ED Notes (Signed)
Pt is aware he is leaving against medical advise at this time. Pt. Removed IVs. Pt. Detached self from monitoring system. Unable to obtain discharge VS.   Discussed AMA discharge and need to seek medical attention ASAP if worsen. Pt ripped discharge instructions and were thrown in trash can.   Upon leaving room, Pt. States "if I said anything to hurt any of y'alls feelings, I hope you know I meant every last word." Pt ambulatory. Refused mask.

## 2019-02-18 ENCOUNTER — Inpatient Hospital Stay (HOSPITAL_COMMUNITY)
Admission: EM | Admit: 2019-02-18 | Discharge: 2019-02-18 | DRG: 189 | Discharge status: Against Medical Advice | Payer: Medicare Other | Attending: Internal Medicine | Admitting: Internal Medicine

## 2019-02-18 ENCOUNTER — Inpatient Hospital Stay (HOSPITAL_COMMUNITY): Payer: Medicare Other

## 2019-02-18 ENCOUNTER — Other Ambulatory Visit: Payer: Self-pay

## 2019-02-18 ENCOUNTER — Emergency Department (HOSPITAL_COMMUNITY): Payer: Medicare Other

## 2019-02-18 ENCOUNTER — Encounter (HOSPITAL_COMMUNITY): Payer: Self-pay | Admitting: *Deleted

## 2019-02-18 DIAGNOSIS — I2510 Atherosclerotic heart disease of native coronary artery without angina pectoris: Secondary | ICD-10-CM | POA: Diagnosis present

## 2019-02-18 DIAGNOSIS — R0600 Dyspnea, unspecified: Secondary | ICD-10-CM

## 2019-02-18 DIAGNOSIS — M7989 Other specified soft tissue disorders: Secondary | ICD-10-CM | POA: Diagnosis present

## 2019-02-18 DIAGNOSIS — Z8701 Personal history of pneumonia (recurrent): Secondary | ICD-10-CM

## 2019-02-18 DIAGNOSIS — F329 Major depressive disorder, single episode, unspecified: Secondary | ICD-10-CM | POA: Diagnosis present

## 2019-02-18 DIAGNOSIS — Z981 Arthrodesis status: Secondary | ICD-10-CM | POA: Diagnosis not present

## 2019-02-18 DIAGNOSIS — R0902 Hypoxemia: Secondary | ICD-10-CM

## 2019-02-18 DIAGNOSIS — J439 Emphysema, unspecified: Secondary | ICD-10-CM | POA: Diagnosis present

## 2019-02-18 DIAGNOSIS — I252 Old myocardial infarction: Secondary | ICD-10-CM

## 2019-02-18 DIAGNOSIS — I5032 Chronic diastolic (congestive) heart failure: Secondary | ICD-10-CM | POA: Diagnosis present

## 2019-02-18 DIAGNOSIS — J432 Centrilobular emphysema: Secondary | ICD-10-CM | POA: Diagnosis not present

## 2019-02-18 DIAGNOSIS — Z8049 Family history of malignant neoplasm of other genital organs: Secondary | ICD-10-CM | POA: Diagnosis not present

## 2019-02-18 DIAGNOSIS — J9621 Acute and chronic respiratory failure with hypoxia: Secondary | ICD-10-CM | POA: Diagnosis present

## 2019-02-18 DIAGNOSIS — R600 Localized edema: Secondary | ICD-10-CM | POA: Diagnosis present

## 2019-02-18 DIAGNOSIS — F419 Anxiety disorder, unspecified: Secondary | ICD-10-CM | POA: Diagnosis present

## 2019-02-18 DIAGNOSIS — Z66 Do not resuscitate: Secondary | ICD-10-CM | POA: Diagnosis present

## 2019-02-18 DIAGNOSIS — I110 Hypertensive heart disease with heart failure: Secondary | ICD-10-CM | POA: Diagnosis present

## 2019-02-18 DIAGNOSIS — R296 Repeated falls: Secondary | ICD-10-CM | POA: Diagnosis present

## 2019-02-18 DIAGNOSIS — E785 Hyperlipidemia, unspecified: Secondary | ICD-10-CM | POA: Diagnosis present

## 2019-02-18 DIAGNOSIS — Z8673 Personal history of transient ischemic attack (TIA), and cerebral infarction without residual deficits: Secondary | ICD-10-CM | POA: Diagnosis not present

## 2019-02-18 DIAGNOSIS — Z8249 Family history of ischemic heart disease and other diseases of the circulatory system: Secondary | ICD-10-CM

## 2019-02-18 DIAGNOSIS — I10 Essential (primary) hypertension: Secondary | ICD-10-CM | POA: Diagnosis present

## 2019-02-18 DIAGNOSIS — Z801 Family history of malignant neoplasm of trachea, bronchus and lung: Secondary | ICD-10-CM

## 2019-02-18 DIAGNOSIS — F17210 Nicotine dependence, cigarettes, uncomplicated: Secondary | ICD-10-CM | POA: Diagnosis present

## 2019-02-18 DIAGNOSIS — Z20822 Contact with and (suspected) exposure to covid-19: Secondary | ICD-10-CM | POA: Diagnosis present

## 2019-02-18 DIAGNOSIS — M79609 Pain in unspecified limb: Secondary | ICD-10-CM | POA: Diagnosis not present

## 2019-02-18 DIAGNOSIS — J9601 Acute respiratory failure with hypoxia: Secondary | ICD-10-CM

## 2019-02-18 DIAGNOSIS — R1310 Dysphagia, unspecified: Secondary | ICD-10-CM | POA: Diagnosis present

## 2019-02-18 DIAGNOSIS — Z808 Family history of malignant neoplasm of other organs or systems: Secondary | ICD-10-CM

## 2019-02-18 LAB — CBC WITH DIFFERENTIAL/PLATELET
Abs Immature Granulocytes: 0.04 10*3/uL (ref 0.00–0.07)
Basophils Absolute: 0 10*3/uL (ref 0.0–0.1)
Basophils Relative: 0 %
Eosinophils Absolute: 0.1 10*3/uL (ref 0.0–0.5)
Eosinophils Relative: 1 %
HCT: 47.3 % (ref 39.0–52.0)
Hemoglobin: 15 g/dL (ref 13.0–17.0)
Immature Granulocytes: 0 %
Lymphocytes Relative: 19 %
Lymphs Abs: 1.7 10*3/uL (ref 0.7–4.0)
MCH: 30.3 pg (ref 26.0–34.0)
MCHC: 31.7 g/dL (ref 30.0–36.0)
MCV: 95.6 fL (ref 80.0–100.0)
Monocytes Absolute: 0.8 10*3/uL (ref 0.1–1.0)
Monocytes Relative: 9 %
Neutro Abs: 6.6 10*3/uL (ref 1.7–7.7)
Neutrophils Relative %: 71 %
Platelets: 223 10*3/uL (ref 150–400)
RBC: 4.95 MIL/uL (ref 4.22–5.81)
RDW: 18.4 % — ABNORMAL HIGH (ref 11.5–15.5)
WBC: 9.3 10*3/uL (ref 4.0–10.5)
nRBC: 0 % (ref 0.0–0.2)

## 2019-02-18 LAB — URINALYSIS, ROUTINE W REFLEX MICROSCOPIC
Bilirubin Urine: NEGATIVE
Glucose, UA: NEGATIVE mg/dL
Hgb urine dipstick: NEGATIVE
Ketones, ur: NEGATIVE mg/dL
Leukocytes,Ua: NEGATIVE
Nitrite: NEGATIVE
Protein, ur: NEGATIVE mg/dL
Specific Gravity, Urine: 1.011 (ref 1.005–1.030)
pH: 6 (ref 5.0–8.0)

## 2019-02-18 LAB — COMPREHENSIVE METABOLIC PANEL
ALT: 34 U/L (ref 0–44)
AST: 24 U/L (ref 15–41)
Albumin: 3.2 g/dL — ABNORMAL LOW (ref 3.5–5.0)
Alkaline Phosphatase: 101 U/L (ref 38–126)
Anion gap: 11 (ref 5–15)
BUN: 12 mg/dL (ref 8–23)
CO2: 27 mmol/L (ref 22–32)
Calcium: 8.8 mg/dL — ABNORMAL LOW (ref 8.9–10.3)
Chloride: 111 mmol/L (ref 98–111)
Creatinine, Ser: 1.03 mg/dL (ref 0.61–1.24)
GFR calc Af Amer: >60 mL/min (ref >60)
GFR calc non Af Amer: >60 mL/min (ref >60)
Glucose, Bld: 116 mg/dL — ABNORMAL HIGH (ref 70–99)
Potassium: 3.3 mmol/L — ABNORMAL LOW (ref 3.5–5.1)
Sodium: 149 mmol/L — ABNORMAL HIGH (ref 135–145)
Total Bilirubin: 0.7 mg/dL (ref 0.3–1.2)
Total Protein: 5.8 g/dL — ABNORMAL LOW (ref 6.5–8.1)

## 2019-02-18 LAB — BRAIN NATRIURETIC PEPTIDE: B Natriuretic Peptide: 480 pg/mL — ABNORMAL HIGH (ref 0.0–100.0)

## 2019-02-18 LAB — HIV ANTIBODY (ROUTINE TESTING W REFLEX): HIV Screen 4th Generation wRfx: NONREACTIVE

## 2019-02-18 LAB — RESPIRATORY PANEL BY RT PCR (FLU A&B, COVID)
Influenza A by PCR: NEGATIVE
Influenza B by PCR: NEGATIVE
SARS Coronavirus 2 by RT PCR: NEGATIVE

## 2019-02-18 LAB — TROPONIN I (HIGH SENSITIVITY)
Troponin I (High Sensitivity): 153 ng/L (ref <18)
Troponin I (High Sensitivity): 145 ng/L (ref <18)

## 2019-02-18 LAB — ECHOCARDIOGRAM COMPLETE
Height: 67 in
Weight: 3040 oz

## 2019-02-18 MED ORDER — CITALOPRAM HYDROBROMIDE 20 MG PO TABS: 40.0000 mg | ORAL_TABLET | Freq: Every day | ORAL | Status: DC

## 2019-02-18 MED ORDER — ENOXAPARIN SODIUM 40 MG/0.4ML ~~LOC~~ SOLN
40.0000 mg | SUBCUTANEOUS | Status: DC
  Administered 2019-02-18: 12:00:00 40 mg via SUBCUTANEOUS
  Filled 2019-02-18: qty 0.4

## 2019-02-18 MED ORDER — ALBUTEROL SULFATE HFA 108 (90 BASE) MCG/ACT IN AERS
4.0000 | INHALATION_SPRAY | Freq: Once | RESPIRATORY_TRACT | Status: AC
  Administered 2019-02-18: 04:00:00 4 via RESPIRATORY_TRACT
  Filled 2019-02-18: qty 6.7

## 2019-02-18 MED ORDER — PREDNISONE 20 MG PO TABS: 40.0000 mg | ORAL_TABLET | Freq: Every day | ORAL | Status: DC

## 2019-02-18 MED ORDER — IPRATROPIUM-ALBUTEROL 0.5-2.5 (3) MG/3ML IN SOLN
3.0000 mL | Freq: Four times a day (QID) | RESPIRATORY_TRACT | Status: DC
  Administered 2019-02-18 (×2): 3 mL via RESPIRATORY_TRACT
  Filled 2019-02-18 (×2): qty 3

## 2019-02-18 MED ORDER — ALBUTEROL SULFATE (2.5 MG/3ML) 0.083% IN NEBU: 2.5000 mg | INHALATION_SOLUTION | RESPIRATORY_TRACT | Status: DC | PRN

## 2019-02-18 MED ORDER — IRBESARTAN 300 MG PO TABS: 300.0000 mg | ORAL_TABLET | Freq: Every day | ORAL | Status: DC

## 2019-02-18 MED ORDER — DIAZEPAM 2 MG PO TABS: 2.0000 mg | ORAL_TABLET | Freq: Four times a day (QID) | ORAL | Status: DC | PRN

## 2019-02-18 MED ORDER — CLONAZEPAM 0.5 MG PO TABS: 2.0000 mg | ORAL_TABLET | Freq: Every day | ORAL | Status: DC

## 2019-02-18 MED ORDER — MECLIZINE HCL 25 MG PO TABS: 125.0000 mg | ORAL_TABLET | Freq: Every day | ORAL | Status: DC

## 2019-02-18 MED ORDER — PANTOPRAZOLE SODIUM 40 MG PO TBEC
40.0000 mg | DELAYED_RELEASE_TABLET | Freq: Every day | ORAL | Status: DC
  Filled 2019-02-18: qty 1

## 2019-02-18 MED ORDER — CITALOPRAM HYDROBROMIDE 20 MG PO TABS: 20.0000 mg | ORAL_TABLET | Freq: Every day | ORAL | Status: DC

## 2019-02-18 MED ORDER — MECLIZINE HCL 25 MG PO TABS
50.0000 mg | ORAL_TABLET | Freq: Every day | ORAL | Status: DC
  Administered 2019-02-18: 50 mg via ORAL
  Filled 2019-02-18: qty 2

## 2019-02-18 MED ORDER — SODIUM CHLORIDE 0.9% FLUSH
3.0000 mL | Freq: Two times a day (BID) | INTRAVENOUS | Status: DC
  Administered 2019-02-18: 12:00:00 3 mL via INTRAVENOUS

## 2019-02-18 MED ORDER — METHYLPREDNISOLONE SODIUM SUCC 125 MG IJ SOLR
60.0000 mg | Freq: Two times a day (BID) | INTRAMUSCULAR | Status: DC
  Administered 2019-02-18: 12:00:00 60 mg via INTRAVENOUS
  Filled 2019-02-18: qty 2

## 2019-02-18 MED ORDER — TRAZODONE HCL 100 MG PO TABS: 100.0000 mg | ORAL_TABLET | Freq: Every day | ORAL | Status: DC

## 2019-02-18 MED ORDER — DOXYCYCLINE HYCLATE 100 MG PO TABS
100.0000 mg | ORAL_TABLET | Freq: Two times a day (BID) | ORAL | Status: DC
  Administered 2019-02-18: 12:00:00 100 mg via ORAL
  Filled 2019-02-18: qty 1

## 2019-02-18 MED ORDER — ATORVASTATIN CALCIUM 40 MG PO TABS
40.0000 mg | ORAL_TABLET | Freq: Every day | ORAL | Status: DC
  Administered 2019-02-18: 17:00:00 40 mg via ORAL
  Filled 2019-02-18: qty 1

## 2019-02-18 MED ORDER — ASPIRIN EC 81 MG PO TBEC: 81.0000 mg | DELAYED_RELEASE_TABLET | Freq: Every day | ORAL | Status: DC

## 2019-02-18 MED ORDER — GABAPENTIN 300 MG PO CAPS
300.0000 mg | ORAL_CAPSULE | Freq: Two times a day (BID) | ORAL | Status: DC
  Administered 2019-02-18: 12:00:00 300 mg via ORAL
  Filled 2019-02-18: qty 1

## 2019-02-18 MED ORDER — AMLODIPINE BESYLATE 10 MG PO TABS: 10.0000 mg | ORAL_TABLET | Freq: Every day | ORAL | Status: DC

## 2019-02-18 NOTE — Evaluation (Addendum)
Clinical/Bedside Swallow Evaluation Patient Details  Name: Rick Church MRN: [id number] Date of Birth: August 27, 1940  Today's Date: 02/18/2019 Time: SLP Start Time (ACUTE ONLY): 1132 SLP Stop Time (ACUTE ONLY): 1145 SLP Time Calculation (min) (ACUTE ONLY): 13 min  Past Medical History:  Past Medical History:  Diagnosis Date  . Acute respiratory distress syndrome (ARDS) (HCC)   . Anxiety   . Aortic stenosis    mild AS 01/2014 echo  . Arthritis   . Complication of anesthesia   . COPD (chronic obstructive pulmonary disease) ([place])   . GERD (gastroesophageal reflux disease)   . Headache   . High cholesterol   . History of hiatal hernia   . Hx of adenomatous colonic polyps   . Hypertension   . Legionnaire's disease ([place])   . Migraine   . Neck pain   . NSTEMI (non-ST elevated myocardial infarction) ([place])    12/2013; cath non-obstructive CAD  . Panic attack   . Pneumonia 2015  . PONV (postoperative nausea and vomiting)    Vomitted after last endo precodure   . 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 Stroke ([place])    hx of x 3 - no residual  -- patient was unaware of CVA   Past Surgical History:  Past Surgical History:  Procedure Laterality Date  . AMPUTATION Left 04/13/2018   Procedure: LEFT RING FINGERTIP REPAIR;  Surgeon: Milly Jakob, MD;  Location: [facility];  Service: Orthopedics;  Laterality: Left;  . BACK SURGERY  2000   fusion   . BIOPSY  12/13/2017   Procedure: BIOPSY;  Surgeon: Wonda Horner, MD;  Location: [facility];  Service: Endoscopy;;  . CARPAL TUNNEL RELEASE Bilateral   . CHOLECYSTECTOMY N/A 07/11/2016   Procedure: LAPAROSCOPIC CHOLECYSTECTOMY;  Surgeon: Georganna Skeans, MD;  Location: [facility];  Service: General;  Laterality: N/A;  . COLONOSCOPY WITH PROPOFOL N/A 12/13/2017   Procedure: COLONOSCOPY WITH PROPOFOL;  Surgeon: Wonda Horner, MD;  Location: [facility];  Service: Endoscopy;  Laterality: N/A;  . COLONOSCOPY WITH PROPOFOL N/A 06/25/2018   Procedure:  COLONOSCOPY WITH PROPOFOL;  Surgeon: Rush Landmark Telford Nab., MD;  Location: [facility];  Service: Gastroenterology;  Laterality: N/A;  . ENDOSCOPIC MUCOSAL RESECTION N/A 06/25/2018   Procedure: ENDOSCOPIC MUCOSAL RESECTION;  Surgeon: Rush Landmark Telford Nab., MD;  Location: [facility];  Service: Gastroenterology;  Laterality: N/A;  . FOOT NEUROMA SURGERY Left 09/29/2101  . HEMOSTASIS CLIP PLACEMENT  06/25/2018   Procedure: HEMOSTASIS CLIP PLACEMENT;  Surgeon: Irving Copas., MD;  Location: [facility];  Service: Gastroenterology;;  . LEFT HEART CATH AND CORONARY ANGIOGRAPHY N/A 06/07/2016   Procedure: Left Heart Cath and Coronary Angiography;  Surgeon: Adrian Prows, MD;  Location: [facility];  Service: Cardiovascular;  Laterality: N/A;  . LEFT HEART CATHETERIZATION WITH CORONARY ANGIOGRAM N/A 01/06/2014   Procedure: LEFT HEART CATHETERIZATION WITH CORONARY ANGIOGRAM;  Surgeon: Peter M Martinique, MD;  Location[facility];  Service: Cardiovascular;  Laterality: N/A;  . LUMBAR FUSION    . NECK SURGERY    . POLYPECTOMY  12/13/2017   Procedure: POLYPECTOMY;  Surgeon: Wonda Horner, MD;  Location: [facility];  Service: Endoscopy;;  . POLYPECTOMY  06/25/2018   Procedure: POLYPECTOMY;  Surgeon: Irving Copas., MD;  Location: [facility];  Service: Gastroenterology;;  . Cherre Robins CUFF REPAIR Left   . SHOULDER SURGERY Right 1998, 2002  . SUBMUCOSAL LIFTING INJECTION  06/25/2018   Procedure: SUBMUCOSAL LIFTING INJECTION;  Surgeon: Rush Landmark Telford Nab., MD;  Location: [facility];  Service: Gastroenterology;;  . TONSILLECTOMY    . TRACHEOSTOMY     feinstein  . TRACHEOSTOMY CLOSURE     HPI:  Rick Church is a 79 y.o. male who presents the emerge department today secondary to continued shortness of breath. Per chart pt seen here couple days ago and left AGAINST MEDICAL ADVICE because he felt better. Per chart pt states that he accidentally took his dog heart warm pills on  Tuesday. PMH: dysphagia, COPD, GERD, pna, panic attacks, hiatal hernia. ST services has seen pt multiple times >MBS 12/11/2013 Pt demonstrated severe oropharyngeal dysphagia with primary structural impairment relating to anterior cervical plate at QA348G impeding epiglottic deflection and airway protection and Dys 2, nectar recommended. MBS repeated 12/27/2013 recommending Dys 3/thin   Assessment / Plan / Recommendation Clinical Impression  Pt seen in 2015 for dysphagia having multiple MBS and FEES x 1. He was found to have dysphagia caused by a primary structural impairment relating to anterior cervical plate at QA348G impeding epiglottic deflection and airway protection. Dys 2= texture, nectar thick liquids were recommended. Today pt states his lungs "haven't been right since I vomited during my colonoscopy and they had to suck it out. " During this assessment per subjective measure, there was not a big concern for aspiration. He did have sporadic immediate and delayed throat clearing following approximately 2 oz volume of thin boluses. If liquids are entering laryngeal vestibule it is possible he may be clearing the majority, although cannot definitively confirm. CXR> No new cardiopulmonary abnormality. Chronic Emphysema. He declined solid texture (cracker) reporting high likelihood of "choking". Discussed chopping meat which he agreed to. SLP ordered Dys 3 texture, thin liquids and recommend pills whole in puree. Dysphagia appears chronic with cervical plate and COPD. SLP will follow up for management of textures liquids and possiblity of needing instrumental testing if increased s/s aspiration present.   SLP Visit Diagnosis: Dysphagia, unspecified (R13.10)    Aspiration Risk  Moderate aspiration risk    Diet Recommendation Dysphagia 3 (Mech soft);Thin liquid   Liquid Administration via: Straw;Cup Medication Administration: Whole meds with puree Supervision: Patient able to self feed Compensations:  Slow rate;Small sips/bites Postural Changes: Seated upright at 90 degrees    Other  Recommendations Oral Care Recommendations: Oral care BID   Follow up Recommendations None      Frequency and Duration min 2x/week  2 weeks       Prognosis Prognosis for Safe Diet Advancement: Good Barriers to Reach Goals: Other (Comment)(cervical hardware)      Swallow Study   General HPI: Rick Church is a 79 y.o. male who presents the emerge department today secondary to continued shortness of breath. Per chart pt seen here couple days ago and left AGAINST MEDICAL ADVICE because he felt better. Per chart pt states that he accidentally took his dog heart warm pills on Tuesday. PMH: dysphagia, COPD, GERD, pna, panic attacks, hiatal hernia. ST services has seen pt multiple times >MBS 12/11/2013 Pt demonstrated severe oropharyngeal dysphagia with primary structural impairment relating to anterior cervical plate at QA348G impeding epiglottic deflection and airway protection and Dys 2, nectar recommended. MBS repeated 12/27/2013 recommending Dys 3/thin Type of Study: Bedside Swallow Evaluation Previous Swallow Assessment: (see HPI) Diet Prior to this Study: NPO Temperature Spikes Noted: No Respiratory Status: Nasal cannula History of Recent Intubation: No Behavior/Cognition: Alert;Cooperative;Pleasant mood;Requires cueing(pleasant by the time therapist saw him) Oral Cavity Assessment: Within Functional Limits Oral Care Completed by SLP: No  Oral Cavity - Dentition: Dentures, top;Dentures, bottom Vision: Functional for self-feeding Self-Feeding Abilities: Able to feed self Patient Positioning: Upright in bed Baseline Vocal Quality: Normal Volitional Cough: Strong Volitional Swallow: Able to elicit    Oral/Motor/Sensory Function Overall Oral Motor/Sensory Function: Within functional limits   Ice Chips Ice chips: Not tested   Thin Liquid Thin Liquid: Impaired Presentation: Straw;Cup Pharyngeal  Phase  Impairments: Throat Clearing - Delayed;Throat Clearing - Immediate    Nectar Thick Nectar Thick Liquid: Not tested   Honey Thick Honey Thick Liquid: Not tested   Puree Puree: Within functional limits   Solid     Solid: (pt declined stating "I'll get choked")      Houston Siren 02/18/2019,11:42 AM  Orbie Pyo Colvin Caroli.Ed Risk analyst [phone] Office [phone]

## 2019-02-18 NOTE — ED Triage Notes (Signed)
Patient presents to  ED today c/o sob states he was here last week for same, states he drove self to ED. Patient is also concern because he accidentally took 4 of his dogs heart worm pill on 2/1

## 2019-02-18 NOTE — ED Notes (Addendum)
Pt standing at bedside with nasal cannula removed, SpO2 decreased to 70% on RA. Pt upset about NPO order (for scheduled swallow eval), yelling and cursing at staff. Attempted to calm pt, pt yelled that he wanted food and a Dr. Malachi Bonds right now or he was leaving AMA. Nasal cannula reapplied and pt returned to bed after at-length discussion. Dr. Lorin Mercy and charge RN made aware, MD came to talk to pt. Dr. Lorin Mercy discussed need for swallow eval with pt again; MD contacted speech pathology to expedite eval. Pt left bed again minutes later, removed nasal cannula, decreased to 74% on RA. RN reapplied [place] and returned pt to bed. Unable to administer scheduled medications or draw labs.

## 2019-02-18 NOTE — ED Notes (Addendum)
The RN April says hole off on getting the blood for this Pt at this time.

## 2019-02-18 NOTE — Evaluation (Signed)
Occupational Therapy Evaluation Patient Details Name: Rick Church MRN: [id number] DOB: 1940-12-15 Today's Date: 02/18/2019    History of Present Illness   79 y.o. male who presents the ED secondary to continued shortness of breath. Pt with recent admission and left AMA. Per chart pt states that he accidentally took his dog heart warm pills on Tuesday. Imaging of chest showing on 02/18/2019 largely resolved basilar predominant pulmonary edema since 02/15/2019.     Clinical Impression   PTA, pt was living alone and was independent. Pt currently requiring Min Guard A for safety and functional mobility. Pt presenting with decreased balance and activity tolerance as seen by fatigued and decreased SpO2 to 83% on 4L with activity. Pt would benefit from further acute OT to facilitate safe dc. Pt declining SNF, recommend dc to home with HHOT for further OT to optimize safety, independence with ADLs, and return to PLOF.      Follow Up Recommendations  Home health OT;Supervision/Assistance - 24 hour    Equipment Recommendations  None recommended by OT    Recommendations for Other Services PT consult     Precautions / Restrictions Precautions Precautions: Fall;Other (comment) Precaution Comments: watch O2; reports hx of falls Restrictions Weight Bearing Restrictions: No      Mobility Bed Mobility Overal bed mobility: Needs Assistance Bed Mobility: Supine to Sit     Supine to sit: Supervision     General bed mobility comments: Supervision for safety  Transfers Overall transfer level: Needs assistance   Transfers: Sit to/from Stand Sit to Stand: Min guard         General transfer comment: MIn Guard A for safety    Balance Overall balance assessment: Mild deficits observed, not formally tested                                         ADL either performed or assessed with clinical judgement   ADL Overall ADL's : Needs assistance/impaired Eating/Feeding:  Supervision/ safety;Set up;Sitting   Grooming: Wash/dry hands;Supervision/safety;Standing   Upper Body Bathing: Supervision/ safety;Set up;Sitting   Lower Body Bathing: Min guard;Sit to/from stand   Upper Body Dressing : Supervision/safety;Set up;Sitting   Lower Body Dressing: Min guard;Sit to/from stand   Toilet Transfer: Min guard;Ambulation   Toileting- Clothing Manipulation and Hygiene: Supervision/safety;Sit to/from stand Toileting - Clothing Manipulation Details (indicate cue type and reason): Supervision for urination at toilet     Functional mobility during ADLs: Min guard General ADL Comments: Pt presenting with decreased balance, strength, and acitvity tolerance with pain at right knee and decreased SpO2.      Vision Baseline Vision/History: Wears glasses Wears Glasses: At all times Patient Visual Report: No change from baseline       Perception     Praxis      Pertinent Vitals/Pain Pain Assessment: Faces Pain Score: 10-Worst pain ever Pain Location: R knee Pain Descriptors / Indicators: Discomfort;Grimacing Pain Intervention(s): Monitored during session;Limited activity within patient's tolerance;Repositioned     Hand Dominance Right   Extremity/Trunk Assessment Upper Extremity Assessment Upper Extremity Assessment: Overall WFL for tasks assessed   Lower Extremity Assessment Lower Extremity Assessment: Defer to PT evaluation RLE Deficits / Details: Very guarded in RLE secondary to R knee pain. Decreased weightbearing noted in standing.    Cervical / Trunk Assessment Cervical / Trunk Assessment: Kyphotic   Communication Communication Communication: No difficulties   Cognition Arousal/Alertness:  Awake/alert Behavior During Therapy: WFL for tasks assessed/performed Overall Cognitive Status: No family/caregiver present to determine baseline cognitive functioning                                     General Comments  SpO2 dropping to  83% on 4L via [place] with extensions. Cued for purse lip breathing and pt requiring increased time and seated rest break to return to 93% on 4L.    Exercises     Shoulder Instructions      Home Living Family/patient expects to be discharged to:: Private residence Living Arrangements: Alone Available Help at Discharge: (Reports no one) Type of Home: House Home Access: Stairs to enter [employer] of Steps: 3 Entrance Stairs-Rails: Left Home Layout: One level     Bathroom Shower/Tub: Teacher, early years/pre: Standard     Home Equipment: Environmental consultant - 2 wheels;Cane - single point;Shower seat;Grab bars - tub/shower;Grab bars - toilet          Prior Functioning/Environment Level of Independence: Independent        Comments: ADLs, IADLs, and Drives. Has dogs he cares for        OT Problem List: Decreased strength;Decreased range of motion;Decreased activity tolerance;Impaired balance (sitting and/or standing);Decreased knowledge of use of DME or AE;Decreased knowledge of precautions;Cardiopulmonary status limiting activity;Pain      OT Treatment/Interventions: Self-care/ADL training;Therapeutic exercise;Energy conservation;DME and/or AE instruction;Therapeutic activities;Patient/family education    OT Goals(Current goals can be found in the care plan section) Acute Rehab OT Goals Patient Stated Goal: Go home OT Goal Formulation: With patient Time For Goal Achievement: 03/04/19 Potential to Achieve Goals: Good  OT Frequency: Min 2X/week   Barriers to D/C: Decreased caregiver support  Lives alone       Co-evaluation              AM-PAC OT "6 Clicks" Daily Activity     Outcome Measure Help from another person eating meals?: None Help from another person taking care of personal grooming?: A Little Help from another person toileting, which includes using toliet, bedpan, or urinal?: A Little Help from another person bathing (including washing,  rinsing, drying)?: A Little Help from another person to put on and taking off regular upper body clothing?: A Little Help from another person to put on and taking off regular lower body clothing?: A Little 6 Click Score: 19   End of Session Equipment Utilized During Treatment: Oxygen(4L) Nurse Communication: Mobility status  Activity Tolerance: Patient tolerated treatment well Patient left: in bed;with call bell/phone within reach;with bed alarm set  OT Visit Diagnosis: Unsteadiness on feet (R26.81);Other abnormalities of gait and mobility (R26.89);Muscle weakness (generalized) (M62.81);Pain Pain - Right/Left: Right Pain - part of body: Knee                Time: [id number] OT Time Calculation (min): 15 min Charges:  OT General Charges $OT Visit: 1 Visit OT Evaluation $OT Eval Moderate Complexity: [place], OTR/L Acute Rehab Pager: [phone] Office: [place] 02/18/2019, 3:46 PM

## 2019-02-18 NOTE — Progress Notes (Signed)
VASCULAR LAB PRELIMINARY  PRELIMINARY  PRELIMINARY  PRELIMINARY  Right lower extremity venous duplex completed.    Preliminary report:  See CV proc for preliminary results.   Darlen Gledhill, RVT 02/18/2019, 11:25 AM

## 2019-02-18 NOTE — Progress Notes (Signed)
Upon assessment this shift this RN noted that patient had 2 magazines loaded with bullets as well as 15+ home medications. This information was reported to the charge RN who contacted hospital security and removed the magazines with bullets out of patient possession to lock up. Charge RN noted that patient had home pills in his bed and a bottle of home gabapentin. This RN attempted to retrieve the medication and when doing so patient became very agitated, aggressive, and used loud profanity. Patient then stood up and approached this RN and snatched the bag of medication again using profanity. Charge RN made aware of this incident. Patient was informed that it was hospital policy that all home medication be sent back home or to our in house pharmacy. Patient refused and verbalized that he would leave if he couldn't keep his medication. Hospital security returned to hand patient a copy of the belongs slip. Security was informed of the recent behavior change and episode and are at bedside with patient.

## 2019-02-18 NOTE — Consult Note (Signed)
CARDIOLOGY CONSULT NOTE  Patient ID: Rick Church MRN: [id number] DOB/AGE: [birth year] 79 y.o.  Admit date: 02/18/2019 Referring Physician  Karmen Bongo, MD Primary Physician:  Aura Dials, MD Reason for Consultation  Shortness of breath.  Patient ID: Rick Church, male    DOB: 1940/07/08, 79 y.o.   MRN: [id number]  Chief Complaint  Patient presents with  . Shortness of Breath   HPI:    Rick Church  is a 79 y.o. coronary artery disease with severe ectasia and RCA and circumflex with slow flow, no critical stenosis on cath in 05/2016, hypertension, COPD, mild aortic stenosis mean pressure gradient 13 mmHg in 03/2016, minimal carotid artery stenosis  At last visit, patient had complaiend of chest pain and shortness of breath. I recommended adding Imdur, as well as Xarelto 2.5 mg bid, givne patients aneurysmal arteries with CAD. Now admitted with worsening dyspnea.   Patient states that his dyspnea has started after he has had colonoscopy in December 2020, procedure could not be completed due to aspiration.  Denies chest pain, he has noticed leg edema over the past 2 to 3 weeks, has chronic orthopnea but worse over the past couple weeks.  Past Medical History:  Diagnosis Date  . Acute respiratory distress syndrome (ARDS) (HCC)   . Anxiety   . Aortic stenosis    mild AS 01/2014 echo  . Arthritis   . Complication of anesthesia   . COPD (chronic obstructive pulmonary disease) ([place])   . Ejection fraction   . GERD (gastroesophageal reflux disease)   . Headache   . High cholesterol   . History of hiatal hernia   . Hx of adenomatous colonic polyps   . Hypertension   . Legionnaire's disease ([place])   . Migraine   . Neck pain   . NSTEMI (non-ST elevated myocardial infarction) ([place])    12/2013; cath non-obstructive CAD  . Panic attack   . Pneumonia 2015  . PONV (postoperative nausea and vomiting)    Vomitted after last endo precodure   . 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 Stroke ([place])    hx of x 3 - no residual  -- patient was unaware of CVA   Past Surgical History:  Procedure Laterality Date  . AMPUTATION Left 04/13/2018   Procedure: LEFT RING FINGERTIP REPAIR;  Surgeon: Milly Jakob, MD;  Location: [place];  Service: Orthopedics;  Laterality: Left;  . BACK SURGERY  2000   fusion   . BIOPSY  12/13/2017   Procedure: BIOPSY;  Surgeon: Wonda Horner, MD;  Location: [facility];  Service: Endoscopy;;  . CARPAL TUNNEL RELEASE Bilateral   . CHOLECYSTECTOMY N/A 07/11/2016   Procedure: LAPAROSCOPIC CHOLECYSTECTOMY;  Surgeon: Georganna Skeans, MD;  Location: [place];  Service: General;  Laterality: N/A;  . COLONOSCOPY WITH PROPOFOL N/A 12/13/2017   Procedure: COLONOSCOPY WITH PROPOFOL;  Surgeon: Wonda Horner, MD;  Location: [facility];  Service: Endoscopy;  Laterality: N/A;  . COLONOSCOPY WITH PROPOFOL N/A 06/25/2018   Procedure: COLONOSCOPY WITH PROPOFOL;  Surgeon: Rush Landmark Telford Nab., MD;  Location: [place];  Service: Gastroenterology;  Laterality: N/A;  . ENDOSCOPIC MUCOSAL RESECTION N/A 06/25/2018   Procedure: ENDOSCOPIC MUCOSAL RESECTION;  Surgeon: Rush Landmark Telford Nab., MD;  Location: [place];  Service: Gastroenterology;  Laterality: N/A;  . FOOT NEUROMA SURGERY Left 09/29/2101  . HEMOSTASIS CLIP PLACEMENT  06/25/2018   Procedure: HEMOSTASIS CLIP PLACEMENT;  Surgeon: Irving Copas., MD;  Location: [place];  Service: Gastroenterology;;  .  LEFT HEART CATH AND CORONARY ANGIOGRAPHY N/A 06/07/2016   Procedure: Left Heart Cath and Coronary Angiography;  Surgeon: Adrian Prows, MD;  Location: [place] [facility];  Service: Cardiovascular;  Laterality: N/A;  . LEFT HEART CATHETERIZATION WITH CORONARY ANGIOGRAM N/A 01/06/2014   Procedure: LEFT HEART CATHETERIZATION WITH CORONARY ANGIOGRAM;  Surgeon: Peter M Martinique, MD;  [facility]: [facility];  Service: Cardiovascular;  Laterality: N/A;  . LUMBAR FUSION    . NECK SURGERY    . POLYPECTOMY   12/13/2017   Procedure: POLYPECTOMY;  Surgeon: Wonda Horner, MD;  Location: [facility];  Service: Endoscopy;;  . POLYPECTOMY  06/25/2018   Procedure: POLYPECTOMY;  Surgeon: Irving Copas., MD;  Location: [place];  Service: Gastroenterology;;  . Cherre Robins CUFF REPAIR Left   . SHOULDER SURGERY Right 1998, 2002  . SUBMUCOSAL LIFTING INJECTION  06/25/2018   Procedure: SUBMUCOSAL LIFTING INJECTION;  Surgeon: Rush Landmark Telford Nab., MD;  Location: [place];  Service: Gastroenterology;;  . TONSILLECTOMY    . TRACHEOSTOMY     feinstein  . TRACHEOSTOMY CLOSURE     Social History   Socioeconomic History  . Marital status: Widowed    Spouse name: Not on file  . Number of children: 0  . Years of education: GED  . Highest education level: GED or equivalent  Occupational History  . Occupation: Disabled  Tobacco Use  . Smoking status: Current Some Day Smoker    Packs/day: 1.50    Types: Cigarettes    Start date: 11/14/1956  . Smokeless tobacco: Former Systems developer    Types: Chew  Substance and Sexual Activity  . Alcohol use: No    Alcohol/week: 0.0 standard drinks  . Drug use: No  . Sexual activity: Not on file  Other Topics Concern  . Not on file  Social History Narrative   Lives at home alone.   Right-handed.   Drinks approximately 1 liter of Dr. Malachi Bonds per day.   Social Determinants of Health   Financial Resource Strain:   . Difficulty of Paying Living Expenses: Not on file  Food Insecurity:   . Worried About Charity fundraiser in the Last Year: Not on file  . Ran Out of Food in the Last Year: Not on file  Transportation Needs:   . Lack of Transportation (Medical): Not on file  . Lack of Transportation (Non-Medical): Not on file  Physical Activity:   . Days of Exercise per Week: Not on file  . Minutes of Exercise per Session: Not on file  Stress:   . Feeling of Stress : Not on file  Social Connections:   . Frequency of Communication with Friends and Family: Not  on file  . Frequency of Social Gatherings with Friends and Family: Not on file  . Attends Religious Services: Not on file  . Active Member of Clubs or Organizations: Not on file  . Attends Archivist Meetings: Not on file  . Marital Status: Not on file  Intimate Partner Violence:   . Fear of Current or Ex-Partner: Not on file  . Emotionally Abused: Not on file  . Physically Abused: Not on file  . Sexually Abused: Not on file   ROS  Review of Systems  Constitution: Positive for malaise/fatigue and weight gain.  Cardiovascular: Positive for dyspnea on exertion and leg swelling. Negative for chest pain and claudication.  Respiratory: Positive for cough, sputum production and wheezing.   Gastrointestinal: Positive for dysphagia. Negative for melena.  All other  systems reviewed and are negative.  Objective   Vitals with BMI 02/18/2019 02/18/2019 02/18/2019  Height - - -  Weight - - -  BMI - - -  Systolic [id number]  Diastolic 75 69 71  Pulse 65 65 71    Blood pressure (!) 151/75, pulse 65, temperature 97.8 F (36.6 C), temperature source Oral, resp. rate (!) 22, height 5\' 7"  (1.702 m), weight 86.2 kg, SpO2 (!) 89 %.    Physical Exam  Constitutional: He appears well-developed and well-nourished. No distress.  HENT:  Head: Atraumatic.  Eyes: Conjunctivae are normal.  Cardiovascular: Normal rate, regular rhythm, S1 normal and S2 normal. Exam reveals no gallop.  Murmur heard.  Harsh midsystolic murmur is present with a grade of 3/6 at the upper right sternal border radiating to the neck. Pulses:      Carotid pulses are on the right side with bruit and on the left side with bruit.      Dorsalis pedis pulses are 1+ on the right side and 1+ on the left side.       Posterior tibial pulses are 1+ on the right side and 1+ on the left side.  No edema. No JVD.  Loss of hair bilateral lower extremity and chronic ischemic changes noted.   Pulmonary/Chest: Effort normal and breath  sounds normal.  Barrel shaped chest  Abdominal: Soft. Bowel sounds are normal.  Skin: Skin is warm and dry.   Laboratory examination:   Recent Labs    04/10/18 0059 02/18/19 0404  NA 139 149*  K 3.8 3.3*  CL 105 111  CO2 28 27  GLUCOSE 136* 116*  BUN 13 12  CREATININE 1.22 1.03  CALCIUM 9.1 8.8*  GFRNONAA 57* >60  GFRAA >60 >60   estimated creatinine clearance is 62.9 mL/min (by C-G formula based on SCr of 1.03 mg/dL).  CMP Latest Ref Rng & Units 02/18/2019 08/24/2018 04/10/2018  Glucose 70 - 99 mg/dL 116(H) - 136(H)  BUN 8 - 23 mg/dL 12 - 13  Creatinine 0.61 - 1.24 mg/dL 1.03 - 1.22  Sodium 135 - 145 mmol/L 149(H) - 139  Potassium 3.5 - 5.1 mmol/L 3.3(L) - 3.8  Chloride 98 - 111 mmol/L 111 - 105  CO2 22 - 32 mmol/L 27 - 28  Calcium 8.9 - 10.3 mg/dL 8.8(L) - 9.1  Total Protein 6.5 - 8.1 g/dL 5.8(L) 6.3 -  Total Bilirubin 0.3 - 1.2 mg/dL 0.7 0.5 -  Alkaline Phos 38 - 126 U/L 101 100 -  AST 15 - 41 U/L 24 17 -  ALT 0 - 44 U/L 34 23 -   CBC Latest Ref Rng & Units 02/18/2019 04/10/2018 09/04/2016  WBC 4.0 - 10.5 K/uL 9.3 10.8(H) 10.5  Hemoglobin 13.0 - 17.0 g/dL 15.0 14.9 14.0  Hematocrit 39.0 - 52.0 % 47.3 46.2 42.1  Platelets 150 - 400 K/uL 223 199 180   Lipid Panel     Component Value Date/Time   TRIG 409 (H) 11/20/2013 0400   HEMOGLOBIN A1C Lab Results  Component Value Date   HGBA1C 5.5 10/06/2015   MPG 111 10/06/2015   TSH Recent Labs    08/24/18 1627  TSH 1.48   BNP (last 3 results) Recent Labs    02/15/19 0309  BNP 218.1*    Medications and allergies   Allergies  Allergen Reactions  . Atorvastatin Other (See Comments)    leg myalgias  . Indocin [Indomethacin] Nausea Only and Other (See Comments)  dizzy  . Penicillins Hives and Other (See Comments)    Tolerated a dose of CEFEPIME 12/31/13 Did it involve swelling of the face/tongue/throat, SOB, or low BP? No Did it involve sudden or severe rash/hives, skin peeling, or any reaction on the  inside of your mouth or nose? Yes Did you need to seek medical attention at a hospital or doctor's office? No When did it last happen?1961 If all above answers are "NO", may proceed with cephalosporin use.   Marland Kitchen Buspirone Other (See Comments)    UNSPECIFIED INTOLERANCE  . Propofol Nausea And Vomiting    Severe vomiting with anesthesia   . Restoril [Temazepam] Other (See Comments)    UNSPECIFIED INTOLERANCE  . Toprol Xl [Metoprolol Tartrate] Other (See Comments)    UNSPECIFIED REACTION    . Asa [Aspirin] Itching and Rash  . Codeine Nausea And Vomiting  . Hytrin [Terazosin] Nausea And Vomiting       . Neurontin [Gabapentin] Nausea And Vomiting       . Oxycodone Nausea Only and Other (See Comments)    "Deathly sick"  . Zestril [Lisinopril] Other (See Comments)    UNSPECIFIED SPECIFIC REACTION  Makes him feel really bad, doesn't feel like getting up in the morning      Prior to Admission medications   Medication Sig Start Date End Date Taking? Authorizing Provider  acetaminophen (TYLENOL) 500 MG tablet Take 500 mg by mouth every 6 (six) hours as needed for moderate pain or headache.    Yes [provider]  amLODipine (NORVASC) 10 MG tablet Take 10 mg by mouth at bedtime. (2100) 11/12/15  Yes [provider]  aspirin EC 81 MG tablet Take 81 mg by mouth at bedtime. (2100)   Yes [provider]  butalbital-acetaminophen-caffeine (FIORICET) 50-325-40 MG tablet Take 1 tablet by mouth every 6 (six) hours as needed for migraine. 09/19/18  Yes Jaffe, Adam R, DO  CAMBIA 50 MG PACK TAKE AS NEEDED FOR SEVERE MIGRAINE Patient taking differently: Take 1 each by mouth daily as needed (migraine).  07/28/15  Yes Marcial Pacas, MD  citalopram (CELEXA) 40 MG tablet Take 40 mg by mouth at bedtime.   Yes [provider]  clonazePAM (KLONOPIN) 2 MG tablet Take 1 tablet (2 mg total) by mouth 2 (two) times daily as needed for anxiety. Patient taking differently: Take 2 mg by  mouth at bedtime.  06/07/16  Yes Adrian Prows, MD  diazepam (VALIUM) 2 MG tablet Take 2 mg by mouth every 6 (six) hours as needed for anxiety.  02/05/19  Yes [provider]  gabapentin (NEURONTIN) 300 MG capsule Take 1 capsule (300 mg total) by mouth 2 (two) times daily. 07/12/18  Yes Jaffe, Adam R, DO  ibuprofen (ADVIL,MOTRIN) 200 MG tablet Take 400 mg by mouth every 8 (eight) hours as needed for headache or moderate pain.    Yes [provider]  irbesartan (AVAPRO) 300 MG tablet Take 300 mg by mouth at bedtime. (2100) 09/02/16  Yes [provider]  meclizine (ANTIVERT) 25 MG tablet Take 50-150 mg by mouth See admin instructions. Take 2-3 tablets by mouth scheduled in the morning & take 5-6 tablets by mouth scheduled at night; take 2-3 tablets if needed for additional dizziness or nausea.   Yes [provider]  Multiple Vitamin (MULTIVITAMIN WITH MINERALS) TABS tablet Take 1 tablet by mouth at bedtime. (2100)   Yes [provider]  pantoprazole (PROTONIX) 40 MG tablet Take 1 tablet (40 mg total)  by mouth daily. Patient taking differently: Take 40 mg by mouth daily as needed (heartburn).  01/25/14  Yes Hongalgi, Lenis Dickinson, MD  pregabalin (LYRICA) 75 MG capsule TAKE 1 CAPSULE BY MOUTH THREE TIMES DAILY AS NEEDED FOR PAIN Patient taking differently: Take 75 mg by mouth 3 (three) times daily as needed (for pain).  12/19/18  Yes Jaffe, Adam R, DO  simvastatin (ZOCOR) 80 MG tablet Take 40 mg by mouth at bedtime. (2100) 03/01/16  Yes [provider]  traZODone (DESYREL) 100 MG tablet Take 100 mg by mouth at bedtime.  05/05/15  Yes [provider]      Current Outpatient Medications  Medication Instructions  . acetaminophen (TYLENOL) 500 mg, Oral, Every 6 hours PRN  . amLODipine (NORVASC) 10 mg, Oral, Daily at bedtime, (2100)  . aspirin EC 81 mg, Oral, Daily at bedtime, (2100)  . butalbital-acetaminophen-caffeine (FIORICET) 50-325-40 MG tablet 1  tablet, Oral, Every 6 hours PRN  . CAMBIA 50 MG PACK TAKE AS NEEDED FOR SEVERE MIGRAINE  . citalopram (CELEXA) 40 mg, Oral, Daily at bedtime  . clonazePAM (KLONOPIN) 2 mg, Oral, 2 times daily PRN  . diazepam (VALIUM) 2 mg, Oral, Every 6 hours PRN  . gabapentin (NEURONTIN) 300 mg, Oral, 2 times daily  . ibuprofen (ADVIL) 400 mg, Oral, Every 8 hours PRN  . irbesartan (AVAPRO) 300 mg, Oral, Daily at bedtime, (2100)  . meclizine (ANTIVERT) 50-150 mg, Oral, See admin instructions, Take 2-3 tablets by mouth scheduled in the morning & take 5-6 tablets by mouth scheduled at night; take 2-3 tablets if needed for additional dizziness or nausea.  . Multiple Vitamin (MULTIVITAMIN WITH MINERALS) TABS tablet 1 tablet, Oral, Daily at bedtime, (2100)  . pantoprazole (PROTONIX) 40 mg, Oral, Daily  . pregabalin (LYRICA) 75 MG capsule TAKE 1 CAPSULE BY MOUTH THREE TIMES DAILY AS NEEDED FOR PAIN  . simvastatin (ZOCOR) 40 mg, Oral, Daily at bedtime, (2100)  . traZODone (DESYREL) 100 mg, Oral, Daily at bedtime    No intake/output data recorded. No intake/output data recorded.    Radiology:   Imaging: DG Chest Port 1 View  Result Date: 02/18/2019 CLINICAL DATA:  79 year old male with recurrent shortness of breath. EXAM: PORTABLE CHEST 1 VIEW COMPARISON:  Portable chest 02/15/2019. FINDINGS: Portable AP semi upright view at 0413 hours. Substantially improved bibasilar ventilation from 02/15/2019. Residual basilar predominant increased interstitial opacity. Questionable small pleural effusions, especially on the right. Underlying mild cardiomegaly. Other mediastinal contours are within normal limits. No pneumothorax. Evidence of upper lobe centrilobular emphysema, confirmed by CT on 11/14/2013. No consolidation. No acute osseous abnormality identified. IMPRESSION: 1. Largely resolved basilar predominant pulmonary edema since 02/15/2019. Questionable small pleural effusions. 2. No new cardiopulmonary abnormality.  Chronic Emphysema (ICD10-J43.9). Electronically Signed   By: Genevie Ann M.D.   On: 02/18/2019 04:34   Cardiac Studies:   Echocardiogram  [03/10/2016]: Left ventricle cavity is normal in size. Moderate concentric hypertrophy of the left ventricle. Normal global wall motion. Doppler evidence of grade I (impaired) diastolic dysfunction, normal LAP. Calculated EF 69%. Left atrial cavity is mildly dilated at 4.3 cm. Trileaflet aortic valve with trace regurgitation. Mild aortic valve leaflet calcification. Mildly restricted aortic valve leaflets. Mild aortic valve stenosis. Aortic valve peak pressure gradient of 25 and mean gradient of 13 mmHg, calculated aortic valve area 1.43 cm.  Carotid Doppler  [04/05/2016]: Minimal stenosis in the right internal carotid artery (1-15%) with severe diffuse heteregenous plaque. Mild stenosis in the right external carotid artery (<  50%). Antegrade vertebral artery flow. Follow up in one year is appropriate if clinically indicated.  Nuclear stress test  [03/04/2016]: 1. The resting electrocardiogram demonstrated normal sinus rhythm, normal resting conduction, no resting arrhythmias and nonspecific ST-T changes. Stress EKG is non-diagnostic for ischemia as it a pharmacologic stress using Lexiscan. 2. The LV is dilated mildly in both rest and stress images. There is a moderate area of severe ischemia in the basal inferior, mid inferior and apical inferior myocardial wall(s). Overall left ventricular systolic function was normal with hypokinesis in the same region. The left ventricular ejection fraction was calculated or visually estimated to be 59%. This is a high risk study, consider further cardiac work-up.  Coronary angiography 06/08/2016:  50% stenosis in the PL branch of RCA disease, a very tiny obtuse marginal 3 was occluded but otherwise no significant disease. Normal LVEF.  Assessment  1.  Shortness of breath and dyspnea exertion related to underlying COPD 2.   Acute on chronic diastolic heart failure.  3. Tobacco use disorder with dependency. 4. Essential hypertension  Recommendations:  His presentation is not consistent with cardiac etiology.  Since he has had colonoscopy in December 2020, states that he has been having difficulty swallowing, he also had aspiration during colonoscopy as per his history, suspect worsening of underlying COPD. Will follow.   Adrian Prows, MD, [facility] 02/18/2019, 7:26 AM [facility] [place] Pager: [phone] Office: [phone]

## 2019-02-18 NOTE — H&P (Signed)
History and Physical    VANDER YAP [id number] DOB: 1940-01-18 DOA: 02/18/2019  PCP: Aura Dials, MD Consultants:  [place] Baseman - ENT; Tomi Likens- neurology; [place] - GI; [place] Silos - orthopedics; [facility] - cardiology; [facility] - neurosurgery Patient coming from:  Home - lives alone; NOK: Nephew, does not request that we call him  Chief Complaint: SOB  HPI: Rick Church is a 79 y.o. male with medical history significant of CVA; CAD; Legionnaire's disease; HTN; HLD; COPD; and mild AS presenting with SOB.  He was previously in the ER on 2/5 with a similar complaint; he :became very impatient with being in the" ER and left AMA.   He reports he has been nausea and dizzy daily for 3 years.  He was here one day last week because he couldn't breathe.  He felt better while here and left.  All week, her has been getting worse.  He incidentally also takes Zofran for his nausea but he took his dog's heart worm pills instead earlier this week.  SOB started about a week or two ago, "or maybe a little bit longer."  He had a colonoscopy at the end of December and is very sensitive to anesthesia; he feels like he aspirated from that procedure and since then he gets choked on his own saliva, on a bowl of oatmeal.  The choking causes him to cough significantly, like he's pulling a muscle.  +wheezing.  No fever.  No known COVID contacts.  He has not gotten his COVID vaccine.  He has gained significant weight in the last year.  +RLE edema, chronic.  No chest pain.    ED Course: Hypoxia, improved with 4L O2.  Likely due to combination of COPD/CHF.  Troponin 150, 140; EKG not normal but no STEMI.  Improved symptoms with albuterol.  BNP pending.  COVID pending.  Left from ER 3 days ago AMA after improvement with breathing treatments.  He took his dog's heart worm pills Tuesday inadvertently - Poison Control says nothing to do.  Review of Systems: As per HPI; otherwise review of systems reviewed and negative.   Ambulatory  Status:  Ambulates without assistance  Past Medical History:  Diagnosis Date  . Acute respiratory distress syndrome (ARDS) (HCC)   . Anxiety   . Aortic stenosis    mild AS 01/2014 echo  . Arthritis   . Complication of anesthesia   . COPD (chronic obstructive pulmonary disease) ([place])   . GERD (gastroesophageal reflux disease)   . Headache   . High cholesterol   . History of hiatal hernia   . Hx of adenomatous colonic polyps   . Hypertension   . Legionnaire's disease ([place])   . Migraine   . Neck pain   . NSTEMI (non-ST elevated myocardial infarction) ([place])    12/2013; cath non-obstructive CAD  . Panic attack   . Pneumonia 2015  . PONV (postoperative nausea and vomiting)    Vomitted after last endo precodure   . 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 Stroke ([place])    hx of x 3 - no residual  -- patient was unaware of CVA    Past Surgical History:  Procedure Laterality Date  . AMPUTATION Left 04/13/2018   Procedure: LEFT RING FINGERTIP REPAIR;  Surgeon: Milly Jakob, MD;  Location: [place];  Service: Orthopedics;  Laterality: Left;  . BACK SURGERY  2000   fusion   . BIOPSY  12/13/2017   Procedure: BIOPSY;  Surgeon: Wonda Horner, MD;  Location: [facility];  Service: Endoscopy;;  . CARPAL TUNNEL RELEASE Bilateral   . CHOLECYSTECTOMY N/A 07/11/2016   Procedure: LAPAROSCOPIC CHOLECYSTECTOMY;  Surgeon: Georganna Skeans, MD;  Location: [place];  Service: General;  Laterality: N/A;  . COLONOSCOPY WITH PROPOFOL N/A 12/13/2017   Procedure: COLONOSCOPY WITH PROPOFOL;  Surgeon: Wonda Horner, MD;  Location: [facility];  Service: Endoscopy;  Laterality: N/A;  . COLONOSCOPY WITH PROPOFOL N/A 06/25/2018   Procedure: COLONOSCOPY WITH PROPOFOL;  Surgeon: Rush Landmark Telford Nab., MD;  Location: [place];  Service: Gastroenterology;  Laterality: N/A;  . ENDOSCOPIC MUCOSAL RESECTION N/A 06/25/2018   Procedure: ENDOSCOPIC MUCOSAL RESECTION;  Surgeon: Rush Landmark Telford Nab., MD;  Location: [place];  Service: Gastroenterology;  Laterality: N/A;  . FOOT NEUROMA SURGERY Left 09/29/2101  . HEMOSTASIS CLIP PLACEMENT  06/25/2018   Procedure: HEMOSTASIS CLIP PLACEMENT;  Surgeon: Irving Copas., MD;  Location: [place];  Service: Gastroenterology;;  . LEFT HEART CATH AND CORONARY ANGIOGRAPHY N/A 06/07/2016   Procedure: Left Heart Cath and Coronary Angiography;  Surgeon: Adrian Prows, MD;  Location: [place] [facility];  Service: Cardiovascular;  Laterality: N/A;  . LEFT HEART CATHETERIZATION WITH CORONARY ANGIOGRAM N/A 01/06/2014   Procedure: LEFT HEART CATHETERIZATION WITH CORONARY ANGIOGRAM;  Surgeon: Peter M Martinique, MD;  Location[facility] C[facility];  Service: Cardiovascular;  Laterality: N/A;  . LUMBAR FUSION    . NECK SURGERY    . POLYPECTOMY  12/13/2017   Procedure: POLYPECTOMY;  Surgeon: Wonda Horner, MD;  Location[facility];  Service: Endoscopy;;  . POLYPECTOMY  06/25/2018   Procedure: POLYPECTOMY;  Surgeon: Irving Copas., MD;  Location: [place];  Service: Gastroenterology;;  . Cherre Robins CUFF REPAIR Left   . SHOULDER SURGERY Right 1998, 2002  . SUBMUCOSAL LIFTING INJECTION  06/25/2018   Procedure: SUBMUCOSAL LIFTING INJECTION;  Surgeon: Rush Landmark Telford Nab., MD;  Location: [place];  Service: Gastroenterology;;  . TONSILLECTOMY    . TRACHEOSTOMY     feinstein  . TRACHEOSTOMY CLOSURE      Social History   Socioeconomic History  . Marital status: Widowed    Spouse name: Not on file  . Number of children: 0  . Years of education: GED  . Highest education level: GED or equivalent  Occupational History  . Occupation: retired Clinical cytogeneticist  Tobacco Use  . Smoking status: Current Every Day Smoker    Packs/day: 2.00    Types: Cigarettes    Start date: 11/14/1956  . Smokeless tobacco: Former Systems developer    Types: Chew  . Tobacco comment: cut back to 1.5 ppd  Substance and Sexual Activity  . Alcohol use: No    Alcohol/week: 0.0 standard drinks  .  Drug use: No  . Sexual activity: Not on file  Other Topics Concern  . Not on file  Social History Narrative   Lives at home alone.   Right-handed.   Drinks approximately 1 liter of Dr. Malachi Bonds per day.   Social Determinants of Health   Financial Resource Strain:   . Difficulty of Paying Living Expenses: Not on file  Food Insecurity:   . Worried About Charity fundraiser in the Last Year: Not on file  . Ran Out of Food in the Last Year: Not on file  Transportation Needs:   . Lack of Transportation (Medical): Not on file  . Lack of Transportation (Non-Medical): Not on file  Physical Activity:   . Days of Exercise per Week: Not on file  . Minutes  of Exercise per Session: Not on file  Stress:   . Feeling of Stress : Not on file  Social Connections:   . Frequency of Communication with Friends and Family: Not on file  . Frequency of Social Gatherings with Friends and Family: Not on file  . Attends Religious Services: Not on file  . Active Member of Clubs or Organizations: Not on file  . Attends Archivist Meetings: Not on file  . Marital Status: Not on file  Intimate Partner Violence:   . Fear of Current or Ex-Partner: Not on file  . Emotionally Abused: Not on file  . Physically Abused: Not on file  . Sexually Abused: Not on file    Allergies  Allergen Reactions  . Atorvastatin Other (See Comments)    leg myalgias  . Indocin [Indomethacin] Nausea Only and Other (See Comments)    dizzy  . Penicillins Hives and Other (See Comments)    Tolerated a dose of CEFEPIME 12/31/13 Did it involve swelling of the face/tongue/throat, SOB, or low BP? No Did it involve sudden or severe rash/hives, skin peeling, or any reaction on the inside of your mouth or nose? Yes Did you need to seek medical attention at a hospital or doctor's office? No When did it last happen?1961 If all above answers are "NO", may proceed with cephalosporin use.   Marland Kitchen Buspirone Other (See Comments)     UNSPECIFIED INTOLERANCE  . Propofol Nausea And Vomiting    Severe vomiting with anesthesia   . Restoril [Temazepam] Other (See Comments)    UNSPECIFIED INTOLERANCE  . Toprol Xl [Metoprolol Tartrate] Other (See Comments)    UNSPECIFIED REACTION    . Asa [Aspirin] Itching and Rash  . Codeine Nausea And Vomiting  . Hytrin [Terazosin] Nausea And Vomiting       . Neurontin [Gabapentin] Nausea And Vomiting       . Oxycodone Nausea Only and Other (See Comments)    "Deathly sick"  . Zestril [Lisinopril] Other (See Comments)    UNSPECIFIED SPECIFIC REACTION  Makes him feel really bad, doesn't feel like getting up in the morning     Family History  Problem Relation Age of Onset  . Hypertension Mother   . Brain cancer Mother   . Lung cancer Mother   . Hypertension Brother   . Cervical cancer Sister   . Hypertension Father   . Cerebral aneurysm Father   . Colon cancer Neg Hx   . Esophageal cancer Neg Hx   . Inflammatory bowel disease Neg Hx   . Liver disease Neg Hx   . Pancreatic cancer Neg Hx   . Rectal cancer Neg Hx   . Stomach cancer Neg Hx     Prior to Admission medications   Medication Sig Start Date End Date Taking? Authorizing Provider  acetaminophen (TYLENOL) 500 MG tablet Take 500 mg by mouth every 6 (six) hours as needed for moderate pain or headache.    Yes [provider]  amLODipine (NORVASC) 10 MG tablet Take 10 mg by mouth at bedtime. (2100) 11/12/15  Yes [provider]  aspirin EC 81 MG tablet Take 81 mg by mouth at bedtime. (2100)   Yes [provider]  butalbital-acetaminophen-caffeine (FIORICET) 50-325-40 MG tablet Take 1 tablet by mouth every 6 (six) hours as needed for migraine. 09/19/18  Yes Jaffe, Adam R, DO  [place] 50 MG PACK TAKE AS NEEDED FOR SEVERE MIGRAINE Patient taking differently: Take 1 each by mouth daily as  needed (migraine).  07/28/15  Yes Marcial Pacas, MD  citalopram (CELEXA) 40 MG tablet Take 40 mg by mouth at bedtime.    Yes [provider]  clonazePAM (KLONOPIN) 2 MG tablet Take 1 tablet (2 mg total) by mouth 2 (two) times daily as needed for anxiety. Patient taking differently: Take 2 mg by mouth at bedtime.  06/07/16  Yes Adrian Prows, MD  diazepam (VALIUM) 2 MG tablet Take 2 mg by mouth every 6 (six) hours as needed for anxiety.  02/05/19  Yes [provider]  gabapentin (NEURONTIN) 300 MG capsule Take 1 capsule (300 mg total) by mouth 2 (two) times daily. 07/12/18  Yes Jaffe, Adam R, DO  ibuprofen (ADVIL,MOTRIN) 200 MG tablet Take 400 mg by mouth every 8 (eight) hours as needed for headache or moderate pain.    Yes [provider]  irbesartan (AVAPRO) 300 MG tablet Take 300 mg by mouth at bedtime. (2100) 09/02/16  Yes [provider]  meclizine (ANTIVERT) 25 MG tablet Take 50-150 mg by mouth See admin instructions. Take 2-3 tablets by mouth scheduled in the morning & take 5-6 tablets by mouth scheduled at night; take 2-3 tablets if needed for additional dizziness or nausea.   Yes [provider]  Multiple Vitamin (MULTIVITAMIN WITH MINERALS) TABS tablet Take 1 tablet by mouth at bedtime. (2100)   Yes [provider]  pantoprazole (PROTONIX) 40 MG tablet Take 1 tablet (40 mg total) by mouth daily. Patient taking differently: Take 40 mg by mouth daily as needed (heartburn).  01/25/14  Yes Hongalgi, Lenis Dickinson, MD  pregabalin (LYRICA) 75 MG capsule TAKE 1 CAPSULE BY MOUTH THREE TIMES DAILY AS NEEDED FOR PAIN Patient taking differently: Take 75 mg by mouth 3 (three) times daily as needed (for pain).  12/19/18  Yes Jaffe, Adam R, DO  simvastatin (ZOCOR) 80 MG tablet Take 40 mg by mouth at bedtime. (2100) 03/01/16  Yes [provider]  traZODone (DESYREL) 100 MG tablet Take 100 mg by mouth at bedtime.  05/05/15  Yes [provider]    Physical Exam: Vitals:   02/18/19 0757 02/18/19 0823 02/18/19 0900 02/18/19 1308  BP:  (!) 155/78  (!) 157/76  Pulse: 63 62  62 60  Resp: 14 20 20 20   Temp:    97.6 F (36.4 C)  TempSrc:      SpO2: 94% 95% 96% (!) 89%  Weight:      Height:         . General:  Appears calm and comfortable and is NAD; very conversant . Eyes:  PERRL, EOMI, normal lids, iris . ENT:  grossly normal hearing, lips & tongue, mmm; artificial upper dentition . Neck:  no LAD, masses or thyromegaly . Cardiovascular:  RRR, no m/r/g.  . Respiratory:   CTA bilaterally with no rales/rhonchi, scant scattered wheezes.  Normal to mildly increased respiratory effort on [place] O2. . Abdomen:  soft, NT, ND, NABS . Back:   normal alignment, no CVAT . Skin:  no rash or induration seen on limited exam . Musculoskeletal:  grossly normal tone BUE/BLE, good ROM, no bony abnormality; remote L index finger partial amputation . Lower extremity:  RLE edema with negative squeeze/Homan's . Psychiatric:  grossly normal mood and affect, speech fluent and appropriate, AOx3 . Neurologic:  CN 2-12 grossly intact, moves all extremities in coordinated fashion    Radiological Exams on Admission: DG Chest Port 1 View  Result Date: 02/18/2019 CLINICAL DATA:  79 year old male  with recurrent shortness of breath. EXAM: PORTABLE CHEST 1 VIEW COMPARISON:  Portable chest 02/15/2019. FINDINGS: Portable AP semi upright view at 0413 hours. Substantially improved bibasilar ventilation from 02/15/2019. Residual basilar predominant increased interstitial opacity. Questionable small pleural effusions, especially on the right. Underlying mild cardiomegaly. Other mediastinal contours are within normal limits. No pneumothorax. Evidence of upper lobe centrilobular emphysema, confirmed by CT on 11/14/2013. No consolidation. No acute osseous abnormality identified. IMPRESSION: 1. Largely resolved basilar predominant pulmonary edema since 02/15/2019. Questionable small pleural effusions. 2. No new cardiopulmonary abnormality. Chronic Emphysema (ICD10-J43.9). Electronically Signed   By: Genevie Ann  M.D.   On: 02/18/2019 04:34   VAS [place] LOWER EXTREMITY VENOUS (DVT)  Result Date: 02/18/2019  Lower Venous DVT Study Indications: Pain, and Swelling.  Comparison Study: No prior study on file Performing Technologist: Sharion Dove RVS  Examination Guidelines: A complete evaluation includes B-mode imaging, spectral Doppler, color Doppler, and power Doppler as needed of all accessible portions of each vessel. Bilateral testing is considered an integral part of a complete examination. Limited examinations for reoccurring indications may be performed as noted. The reflux portion of the exam is performed with the patient in reverse Trendelenburg.  +---------+---------------+---------+-----------+----------+--------------+ RIGHT    CompressibilityPhasicitySpontaneityPropertiesThrombus Aging +---------+---------------+---------+-----------+----------+--------------+ CFV      Full           Yes      Yes                                 +---------+---------------+---------+-----------+----------+--------------+ SFJ      Full                                                        +---------+---------------+---------+-----------+----------+--------------+ FV Prox  Full                                                        +---------+---------------+---------+-----------+----------+--------------+ FV Mid   Full                                                        +---------+---------------+---------+-----------+----------+--------------+ FV DistalFull                                                        +---------+---------------+---------+-----------+----------+--------------+ PFV      Full                                                        +---------+---------------+---------+-----------+----------+--------------+ POP      Full           Yes      Yes                                 +---------+---------------+---------+-----------+----------+--------------+  PTV       Full                                                        +---------+---------------+---------+-----------+----------+--------------+ PERO     Full                                                        +---------+---------------+---------+-----------+----------+--------------+   +----+---------------+---------+-----------+----------+--------------+ LEFTCompressibilityPhasicitySpontaneityPropertiesThrombus Aging +----+---------------+---------+-----------+----------+--------------+ CFV Full           Yes      Yes                                 +----+---------------+---------+-----------+----------+--------------+     Summary: RIGHT: - There is no evidence of deep vein thrombosis in the lower extremity.  interstitial edema noted in calf  LEFT: - No evidence of common femoral vein obstruction.  *See table(s) above for measurements and observations.    Preliminary     EKG: Independently reviewed.  NSR with rate 72; prolonged QTc 520; LAFB; nonspecific ST changes with no evidence of STEMI   Labs on Admission: I have personally reviewed the available labs and imaging studies at the time of the admission.  Pertinent labs:   Na++ 149 K+ 3.3 Glucose 116 Albumin 3.2 HS troponin 153, 145 Unremarkable CBC Respiratory panel PCR pending   Assessment/Plan Principal Problem:   Acute on chronic respiratory failure with hypoxia (HCC) Active Problems:   COPD with emphysema (HCC)   HLD (hyperlipidemia)   HTN (hypertension)   CAD (coronary artery disease), native coronary artery   Chronic diastolic CHF (congestive heart failure) (HCC)   Dysphagia   Edema of right lower extremity   Acute on chronic respiratory failure with hypoxia, likely related to COPD exacerbation -Patient has an extraordinarily high pack year history with ongoing smoking of 1-2 ppd -He complains of subacute shortness of breath and productive cough, most likely caused by acute COPD exacerbation.    -He does not have fever or leukocytosis.  -Chest x-ray is not consistent with pneumonia -will admit patient - with his failure of outpatient therapy and markedly decreased O2 sats (into the 70s), it seems likely that he will need several days of hospitalization to show sufficient improvement for discharge. -Nebulizers: scheduled Duoneb and prn albuterol -Solu-Medrol 60 mg IV BID -> prednisone -PO doxycycline -COVID negative -He previously left the ER AMA and was threatening to do so again; I calmly tried to explain that this would be detrimental to his health and possibly life-threatening and would very likely to lead to repeat ER visit - if he survived; I then asked him to stop cursing at the nurses.  Chronic diastolic CHF -Patient's symptoms appear to be more likely associated with pulmonary disease -Cardiology was consulted and the patient has been seen by Dr. Einar Gip -Repeat echo was ordered  RLE edema -Patient with RLE edema at the time of my evaluation; he reported that this was new and did not mention chronic R knee issues -He did mention recent falls at home -With PT evaluation, he reported h/o chronic R knee  issues requiring joint injections and apparently requested this now -This limited his ability to participate in PT -I have recommended outpatient ortho f/u -RLE DVT US ordered but edema is more likely associated with chronic knee issues  Dysphagia -Patient reported that after his colonoscopy about a month ago, he thinks he aspirated -Since then he has had recurrent coughing/choking with PO intake -As such, I made him NPO until his ST swallow evaluation -He became very disgruntled about having to be NPO and threatened to leave AMA; however, he could barely stand up at the bedside without O2 and dropped his sats into the 70s and so reluctantly agreed to stay -Swallow evaluation was performed (rapid response by ST was greatly appreciated!) and he is now on a dysphagia 3  diet  HTN -Continue Norvasc, Avapro  HLD -Continue Zocor  CAD -Continue ASA  Depression/anxiety -Continue Celexa, although the dose was adjusted to account for QTC prolongation -He is taking both Valium and Klonopin; this may need to be considered by his PCP but will be continued for now -He was also taking both Neurontin and Lyrica; the Lyrica has been held for now and this also should be addressed by his PCP -Continue Trazodone  Chronic dizziness/nausea -Perhaps related to medications -Suggest tapering to only 1 BZD and stopping either Neurontin or Lyrica -He may benefit from vestibular PT, but he reports that this has been going on for months so this is not an acute issue  -For now continue Meclizine but he is also on very large doses of this medication -He took his dog's heartworm medication and so his baseline judgement may be impaired; per Poison Control, this does not need to be followed    Note: This patient has been tested and is negative for the novel coronavirus COVID-19.   DVT prophylaxis: Lovenox  Code Status:  DNR - confirmed with patient Family Communication: None present; he requested that I not call his NOK Disposition Plan:  The patient is anticipated to d/c to home once the acute respiratory issues have been resolved; he may benefit from [facility] including PT, and appears likely to need home O2 Consults called: TOC team/PT/OT/Nutrition/RT  Admission status: Admit - It is my clinical opinion that admission to [facility] is reasonable and necessary because this patient will require at least 2 midnights in the hospital to treat this condition based on the medical complexity of the problems presented.  Given the aforementioned information, the predictability of an adverse outcome is felt to be significant.    Karmen Bongo MD [facility]   How to contact the [facility] Attending or Consulting provider [place] or covering provider during after hours [place], for this  patient?  1. Check the care team in [facility] and look for a) attending/consulting [facility] provider listed and b) the [facility] team listed 2. Log into [email or web address] and use [place]'s universal password to access. If you do not have the password, please contact the hospital operator. 3. Locate the [facility] provider you are looking for under [facility] and page to a number that you can be directly reached. 4. If you still have difficulty reaching the provider, please page the [facility] (Director on Call) for the Hospitalists listed on amion for assistance.   02/18/2019, 2:14 PM

## 2019-02-18 NOTE — Progress Notes (Signed)
  Echocardiogram 2D Echocardiogram has been performed.  Geoffery Lyons [place] 02/18/2019, 11:45 AM

## 2019-02-18 NOTE — ED Notes (Signed)
Pt asking for food. Pt NPO and informed of that. Pt upset and stating that he is going to eat and drink. April - RN aware.
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
## 2019-02-18 NOTE — Progress Notes (Signed)
02/18/19 1749  PT Visit Information  Last PT Received On 02/18/19  Assistance Needed +1  History of Present Illness   79 y.o. male who presents the ED secondary to continued shortness of breath. Pt with recent admission and left AMA. Per chart pt states that he accidentally took his dog heart warm pills on Tuesday. Imaging of chest showing on 02/18/2019 largely resolved basilar predominant pulmonary edema since 02/15/2019.  Precautions  Precautions Fall;Other (comment)  Precaution Comments watch O2; reports hx of falls  Restrictions  Weight Bearing Restrictions No  Home Living  Family/patient expects to be discharged to: Private residence  Living Arrangements Alone  Available Help at Discharge  (Reports no one)  Type of [place] to enter  Entrance Stairs-Number of Steps 3  Entrance Stairs-Rails Left  Home Layout One level  Bathroom Shower/Tub Tub/shower unit  Research officer, trade union - single point;Shower seat;Grab bars - tub/shower;Grab bars - toilet;Walker - 2 wheels  Prior Function  Level of Independence Independent  Comments ADLs, IADLs, and Drives. Has dogs he cares for  Communication  Communication No difficulties  Pain Assessment  Pain Assessment 0-10  Pain Score 10  Pain Location R knee  Pain Descriptors / Indicators Discomfort;Grimacing  Pain Intervention(s) Limited activity within patient's tolerance;Monitored during session;Repositioned  Cognition  Arousal/Alertness Awake/alert  Behavior During Therapy WFL for tasks assessed/performed  Overall Cognitive Status No family/caregiver present to determine baseline cognitive functioning  Upper Extremity Assessment  Upper Extremity Assessment Defer to OT evaluation  Lower Extremity Assessment  Lower Extremity Assessment Generalized weakness;RLE deficits/detail  RLE Deficits / Details Very guarded in RLE secondary to R knee pain. Decreased weightbearing noted in standing.    Cervical / Trunk Assessment  Cervical / Trunk Assessment Kyphotic  Bed Mobility  Overal bed mobility Needs Assistance  Bed Mobility Supine to Sit;Sit to Supine  Supine to sit Supervision  Sit to supine Supervision  General bed mobility comments Supervision for safety  Transfers  Overall transfer level Needs assistance  Equipment used Rolling walker (2 wheeled)  Transfers Sit to/from Stand  Sit to Stand Min guard  General transfer comment Min Guard A for safety  Ambulation/Gait  Ambulation/Gait assistance Min guard  Assistive device Rolling walker (2 wheeled)  General Gait Details Took side steps at EOB. Pt very SOB, however, oxygen sats at 90% on 4L. Pt also with increased pain and limited weightshift to RLE secondary to pain.   Balance  Overall balance assessment Mild deficits observed, not formally tested  PT - End of Session  Equipment Utilized During Treatment Gait belt;Oxygen  Activity Tolerance Patient limited by fatigue;Patient limited by pain  Patient left in bed;with call bell/phone within reach  Nurse Communication Mobility status  PT Assessment  PT Recommendation/Assessment Patient needs continued PT services  PT Visit Diagnosis Other abnormalities of gait and mobility (R26.89);Unsteadiness on feet (R26.81);History of falling (Z91.81);Muscle weakness (generalized) (M62.81);Pain  Pain - Right/Left Right  Pain - part of body Knee  PT Problem List Decreased strength;Decreased range of motion;Decreased activity tolerance;Decreased balance;Decreased mobility;Decreased knowledge of use of DME;Decreased safety awareness;Decreased knowledge of precautions;Pain  Barriers to Discharge Decreased caregiver support  PT Plan  PT Frequency (ACUTE ONLY) Min 3X/week  PT Treatment/Interventions (ACUTE ONLY) Gait training;DME instruction;Functional mobility training;Stair training;Therapeutic activities;Balance training;Therapeutic exercise;Patient/family education  AM-PAC PT "6 Clicks"  Mobility Outcome Measure (Version 2)  Help needed turning from your back to your side while in a flat bed without  using bedrails? 4  Help needed moving from lying on your back to sitting on the side of a flat bed without using bedrails? 4  Help needed moving to and from a bed to a chair (including a wheelchair)? 3  Help needed standing up from a chair using your arms (e.g., wheelchair or bedside chair)? 3  Help needed to walk in hospital room? 3  Help needed climbing 3-5 steps with a railing?  2  6 Click Score 19  Consider Recommendation of Discharge To: Home with [facility]  PT Recommendation  Follow Up Recommendations Home health PT;Supervision/Assistance - 24 hour (pt refusing SNF )  PT equipment Other (comment) (TBD)  Individuals Consulted  Consulted and Agree with Results and Recommendations Patient  Acute Rehab PT Goals  Patient Stated Goal Go home  PT Goal Formulation With patient  Time For Goal Achievement 03/04/19  Potential to Achieve Goals Good  PT Time Calculation  PT Start Time (ACUTE ONLY) 1315  PT Stop Time (ACUTE ONLY) 1338  PT Time Calculation (min) (ACUTE ONLY) 23 min  PT General Charges  $$ ACUTE PT VISIT 1 Visit  PT Evaluation  $PT Eval Moderate Complexity 1 Mod  PT Treatments  $Therapeutic Activity 8-22 mins  Written Expression  Dominant Hand Right   Pt admitted with problem above and deficits above. Pt limited secondary to R knee pain and decreased activity tolerance. Pt with increased SOB, however, sats at 90% on 4L after taking side steps at EOB. Feel pt would benefit from SNF level therapies, however, currently refusing. Pt reports no one at home to assist, so would benefit from [facility] services, as he is declining SNF. Will continue to follow acutely to maximize functional mobility independence and safety.   Reuel Derby, PT, DPT  Acute Rehabilitation Services  Pager: [phone] Office: [phone]

## 2019-02-18 NOTE — ED Notes (Signed)
Set up pt. on bedside toilet Pt. A&Ox4 and following commands. Pt instructed to inform RN/NT when moving back to bed to reduce risk for fall. Pt gait unstable.

## 2019-02-19 ENCOUNTER — Other Ambulatory Visit: Payer: Medicare Other

## 2019-02-19 NOTE — Discharge Summary (Signed)
Discharge Summary  Rick Church [id number] DOB: 1940/05/25  PCP: Aura Dials, MD  Admit date: 02/18/2019 Discharge date: 02/19/2019   Time spent: 35 minutes  Admitted From: Home Disposition:  LEFT AMA  Recommendations for Outpatient Follow-up:  1. Follow up with PCP this week 2. You appear to need home oxygen 3. Physical therapy has recommended SNF placement; [facility] services would be important since you would not consider SNF    Discharge Diagnoses:  Active Hospital Problems   Diagnosis Date Noted  . Acute on chronic respiratory failure with hypoxia ([place]) 02/18/2019  . Edema of right lower extremity 02/18/2019  . Dysphagia 07/20/2018  . CAD (coronary artery disease), native coronary artery 02/07/2014  . Chronic diastolic CHF (congestive heart failure) ([place]) 02/07/2014  . HLD (hyperlipidemia) 01/22/2014  . HTN (hypertension) 01/22/2014  . COPD with emphysema ([place]) 11/15/2013    Resolved Hospital Problems  No resolved problems to display.    Discharge Condition: Unstable - left AMA  CODE STATUS:  DNR Diet recommendation:  Heart healthy, dysphagia 3 diet  Vitals:   02/18/19 1308 02/18/19 1410  BP: (!) 157/76   Pulse: 60 62  Resp: 20 16  Temp: 97.6 F (36.4 C)   SpO2: (!) 89% 90%    History of present illness:  Rick Church is a 79 y.o. year old male with medical history significant for CVA; CAD; Legionnaire's disease; HTN; HLD; COPD; and mild AS who presented on 02/18/2019 with SOB and was found to have a COPD exacerbation; he appears likely to need home O2 but left AMA prior to complete evaluation and treatment. Remaining hospital course addressed in problem based format below:   Hospital Course:   Acute on chronic respiratory failure with hypoxia, likely related to COPD exacerbation -Patient has an extraordinarily high pack year history with ongoing smoking of 1-2 ppd -He complained of subacute shortness of breath and productive cough, most likely caused by  acute COPD exacerbation.  -No fever or leukocytosis.  -Chest x-ray was not consistent with pneumonia -He would benefit from home nebulizers: scheduled Duoneb and prn albuterol -He would also benefit from prednisone -COVID negative -He previously left the ER AMA and threatened to do so again again the time of admission; I calmly tried to explain that this would be detrimental to his health and possibly life-threatening and would very likely to lead to repeat ER visit - if he survived; I then asked him to stop cursing at the nurses. -Upon arrival upstairs, he continued to exhibit behaviors that limited our ability to effectively care for him.  He was found to have 2 full magazines with bullets which were confiscated and placed in storage for him.  He also had his home medications that he refused to relinquish.   -He left AMA later in the evening.  Chronic diastolic CHF -Patient's symptoms appear to be more likely associated with pulmonary disease -Cardiology was consulted and the patient was been seen by Dr. Einar Gip -Repeat echo was performed and showed preserved EF and grade 1 diastolic dysfunction  RLE edema -Patient with RLE edema at the time of my evaluation; he reported that this was new and did not mention chronic R knee issues -He did mention recent falls at home -With PT evaluation, he reported h/o chronic R knee issues requiring joint injections and apparently requested this as inpatient -This limited his ability to participate in PT -I have recommended outpatient ortho f/u -RLE DVT US negative for clot -PT evaluated him and  recommended SNF, which the patient declined. -OT evaluated him and recommended HH and 24 hour supervision.  Dysphagia -Patient reported that after his colonoscopy about a month ago, he thinks he aspirated -Since then he has had recurrent coughing/choking with PO intake -As such, I made him NPO until his ST swallow evaluation -He became very disgruntled about  having to be NPO and threatened to leave AMA; however, he could barely stand up at the bedside without O2 and dropped his sats into the 70s and so reluctantly agreed to stay -Swallow evaluation was performed (rapid response by ST was greatly appreciated!) and he was recommended to have a dysphagia 3 diet  HTN -Continue Norvasc, Avapro  HLD -Continue Zocor  CAD -Continue ASA  Depression/anxiety -Continue Celexa, although the dose was adjusted to account for QTC prolongation -He is taking both Valium and Klonopin; this may need to be considered by his PCP but will be continued for now -He was also taking both Neurontin and Lyrica; the Lyrica has been held for now and this also should be addressed by his PCP -Continue Trazodone  Chronic dizziness/nausea -Perhaps related to medications -Suggest tapering to only 1 BZD and stopping either Neurontin or Lyrica -He may benefit from vestibular PT as an outpatient  -For now continue Meclizine but he is also on very large doses of this medication -He took his dog's heartworm medication and so his baseline judgement may be impaired; per Poison Control, this does not need to be followed   Consultations:  Cardiology  ST/PT/OT  Procedures/Studies: Echo RLE DVT US  Discharge Exam: BP (!) 157/76 (BP Location: Left Arm)   Pulse 62   Temp 97.6 F (36.4 C)   Resp 16   Ht 5\' 7"  (1.702 m)   Wt 86.2 kg   SpO2 90%   BMI 29.76 kg/m   Unable to perform since the patient left AMA  Discharge Instructions Follow up: Please make an appointment to see your primary physician for follow up within 3 days of hospital discharge.  At that appointment:  -We routinely change or add medications that can affect your baseline labs and fluid status; therefore, you may require repeat blood work or tests during your next visit with your PCP.  Your PCP may decide not to get them or may add new tests based on their clinical decision.  -Please get all  medicines reviewed and adjusted.  -Please request that your primary physician go over all hospital tests and procedure/radiological results at the follow up.  Please get all hospital records sent to your physician by signing a hospital release before you go home.  Activity: As tolerated with fall precautions; use walker/cane & assistance as needed.  Disposition: Home    Diet:   Heart Healthy, dysphagia 3.  For Heart failure patients - Check your weight at the same time daily.  If you gain over 2 pounds, develop leg swelling, or experience more shortness of breath/chest pain, call your Primary MD immediately. Follow cardiac low salt diet with no more than 1.5 liters/day of fluid.  For all patients - If you experience worsening of your admission symptoms or develop shortness of breath, life threatening emergency, suicidal or homicidal thoughts you must seek medical attention immediately by calling 911 or calling your MD immediately.  Read complete instructions along with all the possible side effects for all the medicines you take and that have been prescribed to you. Take any new medicines after you have completely understood and accept all  the possible adverse reactions/side effects.   Do not drive, operate heavy machinery, perform activities at heights, swimming or participation in water activities or provide baby sitting services if your were admitted for syncope/seizures until you have seen by Primary MD/Neurologist and advised to do so.  Do not drive when taking pain medications.    Do not take more than prescribed pain, sleep and anxiety medications.  Special Instructions: If you have smoked or chewed Tobacco  in the last 2 yrs please stop smoking; also stop any regular Alcohol and/or any Recreational drug use including marijuana.  Wear Seat belts while driving.   Please note:  You were cared for by a hospitalist during your hospital stay. If you have any questions about your discharge  medications or the care you received while you were in the hospital, you can call the unit and asked to speak with the hospitalist on call. Once you are discharged, your primary care physician will handle any further medical issues. Please note that NO REFILLS for any discharge medications will be authorized, as it is imperative that you return to your primary care physician (or establish a relationship with a primary care physician if you do not have one) for your aftercare needs so that they can reassess your need for medications and monitor your lab values.   Allergies as of 02/18/2019      Reactions   Atorvastatin Other (See Comments)   leg myalgias   Indocin [indomethacin] Nausea Only, Other (See Comments)   dizzy   Penicillins Hives, Other (See Comments)   Tolerated a dose of CEFEPIME 12/31/13 Did it involve swelling of the face/tongue/throat, SOB, or low BP? No Did it involve sudden or severe rash/hives, skin peeling, or any reaction on the inside of your mouth or nose? Yes Did you need to seek medical attention at a hospital or doctor's office? No When did it last happen?1961 If all above answers are "NO", may proceed with cephalosporin use.   Buspirone Other (See Comments)   UNSPECIFIED INTOLERANCE   Propofol Nausea And Vomiting   Severe vomiting with anesthesia    Restoril [temazepam] Other (See Comments)   UNSPECIFIED INTOLERANCE   Toprol Xl [metoprolol Tartrate] Other (See Comments)   UNSPECIFIED REACTION    Asa [aspirin] Itching, Rash   Codeine Nausea And Vomiting   Hytrin [terazosin] Nausea And Vomiting      Neurontin [gabapentin] Nausea And Vomiting      Oxycodone Nausea Only, Other (See Comments)   "Deathly sick"   Zestril [lisinopril] Other (See Comments)   UNSPECIFIED SPECIFIC REACTION  Makes him feel really bad, doesn't feel like getting up in the morning       Medication List    ASK your doctor about these medications   acetaminophen 500 MG tablet Commonly  known as: TYLENOL Take 500 mg by mouth every 6 (six) hours as needed for moderate pain or headache.   amLODipine 10 MG tablet Commonly known as: NORVASC Take 10 mg by mouth at bedtime. (2100)   aspirin EC 81 MG tablet Take 81 mg by mouth at bedtime. (2100)   butalbital-acetaminophen-caffeine 50-325-40 MG tablet Commonly known as: FIORICET Take 1 tablet by mouth every 6 (six) hours as needed for migraine.   [place] 50 MG Pack Generic drug: Diclofenac Potassium(Migraine) TAKE AS NEEDED FOR SEVERE MIGRAINE   citalopram 40 MG tablet Commonly known as: CELEXA Take 40 mg by mouth at bedtime.   clonazePAM 2 MG tablet Commonly known as: Bobbye [place]  Take 1 tablet (2 mg total) by mouth 2 (two) times daily as needed for anxiety.   diazepam 2 MG tablet Commonly known as: VALIUM Take 2 mg by mouth every 6 (six) hours as needed for anxiety.   gabapentin 300 MG capsule Commonly known as: NEURONTIN Take 1 capsule (300 mg total) by mouth 2 (two) times daily.   ibuprofen 200 MG tablet Commonly known as: ADVIL Take 400 mg by mouth every 8 (eight) hours as needed for headache or moderate pain.   irbesartan 300 MG tablet Commonly known as: AVAPRO Take 300 mg by mouth at bedtime. (2100)   meclizine 25 MG tablet Commonly known as: ANTIVERT Take 50-150 mg by mouth See admin instructions. Take 2-3 tablets by mouth scheduled in the morning & take 5-6 tablets by mouth scheduled at night; take 2-3 tablets if needed for additional dizziness or nausea.   multivitamin with minerals Tabs tablet Take 1 tablet by mouth at bedtime. (2100)   pantoprazole 40 MG tablet Commonly known as: PROTONIX Take 1 tablet (40 mg total) by mouth daily.   pregabalin 75 MG capsule Commonly known as: LYRICA TAKE 1 CAPSULE BY MOUTH THREE TIMES DAILY AS NEEDED FOR PAIN   simvastatin 80 MG tablet Commonly known as: ZOCOR Take 40 mg by mouth at bedtime. (2100)   traZODone 100 MG tablet Commonly known as:  DESYREL Take 100 mg by mouth at bedtime.      Allergies  Allergen Reactions  . Atorvastatin Other (See Comments)    leg myalgias  . Indocin [Indomethacin] Nausea Only and Other (See Comments)    dizzy  . Penicillins Hives and Other (See Comments)    Tolerated a dose of CEFEPIME 12/31/13 Did it involve swelling of the face/tongue/throat, SOB, or low BP? No Did it involve sudden or severe rash/hives, skin peeling, or any reaction on the inside of your mouth or nose? Yes Did you need to seek medical attention at a hospital or doctor's office? No When did it last happen?1961 If all above answers are "NO", may proceed with cephalosporin use.   Marland Kitchen Buspirone Other (See Comments)    UNSPECIFIED INTOLERANCE  . Propofol Nausea And Vomiting    Severe vomiting with anesthesia   . Restoril [Temazepam] Other (See Comments)    UNSPECIFIED INTOLERANCE  . Toprol Xl [Metoprolol Tartrate] Other (See Comments)    UNSPECIFIED REACTION    . Asa [Aspirin] Itching and Rash  . Codeine Nausea And Vomiting  . Hytrin [Terazosin] Nausea And Vomiting       . Neurontin [Gabapentin] Nausea And Vomiting       . Oxycodone Nausea Only and Other (See Comments)    "Deathly sick"  . Zestril [Lisinopril] Other (See Comments)    UNSPECIFIED SPECIFIC REACTION  Makes him feel really bad, doesn't feel like getting up in the morning       The results of significant diagnostics from this hospitalization (including imaging, microbiology, ancillary and laboratory) are listed below for reference.    Significant Diagnostic Studies: DG Chest Port 1 View  Result Date: 02/18/2019 CLINICAL DATA:  79 year old male with recurrent shortness of breath. EXAM: PORTABLE CHEST 1 VIEW COMPARISON:  Portable chest 02/15/2019. FINDINGS: Portable AP semi upright view at 0413 hours. Substantially improved bibasilar ventilation from 02/15/2019. Residual basilar predominant increased interstitial opacity. Questionable small pleural  effusions, especially on the right. Underlying mild cardiomegaly. Other mediastinal contours are within normal limits. No pneumothorax. Evidence of upper lobe centrilobular emphysema, confirmed by CT  on 11/14/2013. No consolidation. No acute osseous abnormality identified. IMPRESSION: 1. Largely resolved basilar predominant pulmonary edema since 02/15/2019. Questionable small pleural effusions. 2. No new cardiopulmonary abnormality. Chronic Emphysema (ICD10-J43.9). Electronically Signed   By: Genevie Ann M.D.   On: 02/18/2019 04:34   DG Chest Portable 1 View  Result Date: 02/15/2019 CLINICAL DATA:  Shortness of breath for several days EXAM: PORTABLE CHEST 1 VIEW COMPARISON:  03/26/2018 FINDINGS: Cardiac shadow is enlarged. Aortic calcifications are noted. Increased vascular congestion is noted with interstitial edema and bibasilar atelectatic changes. No bony abnormality is seen. Postsurgical changes in the cervical spine are noted. IMPRESSION: Changes consistent with CHF and bibasilar atelectasis. Electronically Signed   By: Inez Catalina M.D.   On: 02/15/2019 03:47   ECHOCARDIOGRAM COMPLETE  Result Date: 02/18/2019    ECHOCARDIOGRAM REPORT   Patient Name:   Rick Church Date of Exam: 02/18/2019 Medical Rec #:  [id number]      Height:       67.0 in Accession #:    [id number]     Weight:       190.0 lb Date of Birth:  11-10-1940       BSA:          1.98 m Patient Age:    63 years       BP:           155/78 mmHg Patient Gender: M              HR:           65 bpm. Exam Location:  Inpatient Procedure: 2D Echo, Cardiac Doppler and Color Doppler Indications:    Dyspnea 786.09  History:        Patient has prior history of Echocardiogram examinations, most                 recent 01/23/2014. CHF, CAD, COPD and Stroke, Aortic Valve                 Disease, Arrythmias:non-specific ST changes; Risk                 Factors:Hypertension, Dyslipidemia and Current Smoker. GERD.  Sonographer:    Vickie Epley RDCS Referring Phys: [place]  1. Left ventricular ejection fraction, by estimation, is 60 to 65%. The left ventricle has normal function. Left ventricular diastolic parameters are consistent with Grade I diastolic dysfunction (impaired relaxation). Mild LV hypertrophy. No regional wall motion abnormalities.  2. Right ventricular systolic function is normal. Tricuspid regurgitation signal is inadequate for assessing RVSP.  3. The aortic valve is tricuspid. There is moderate stenosis with mean gradient 22 mmHg and AVA 1.09 cm^2.  4. No significant mitral regurgitation.  5. The IVC was dilated suggesting RA pressure 15 mmHg. FINDINGS  Left Ventricle: Left ventricular ejection fraction, by estimation, is 60 to 65%. The left ventricle has normal function. The left ventricle has no regional wall motion abnormalities. The left ventricular internal cavity size was normal in size. There is  mildly increased left ventricular hypertrophy. Right Ventricle: The right ventricular size is normal. No increase in right ventricular wall thickness. Right ventricular systolic function is normal. Tricuspid regurgitation signal is inadequate for assessing RVSP. Left Atrium: Left atrial size was normal in size. Right Atrium: Right atrial size was normal in size. Pericardium: There is no evidence of pericardial effusion. Mitral Valve: The mitral valve is normal in structure and function. No evidence of mitral valve regurgitation.  No evidence of mitral valve stenosis. Tricuspid Valve: The tricuspid valve is normal in structure. Tricuspid valve regurgitation is not demonstrated. Aortic Valve: The aortic valve is tricuspid. Aortic valve regurgitation is not visualized. Aortic valve mean gradient measures 22.0 mmHg. Aortic valve peak gradient measures 37.5 mmHg. Aortic valve area, by VTI measures 1.09 cm. Pulmonic Valve: The pulmonic valve was normal in structure. Pulmonic valve regurgitation is not visualized. Aorta: The aortic root is normal  in size and structure. Venous: The inferior vena cava is dilated in size with less than 50% respiratory variability, suggesting right atrial pressure of 15 mmHg. IAS/Shunts: No atrial level shunt detected by color flow Doppler.  LEFT VENTRICLE PLAX 2D LVIDd:         5.11 cm  Diastology LVIDs:         3.65 cm  LV e' lateral:   8.38 cm/s LV PW:         0.94 cm  LV E/e' lateral: 12.4 LV IVS:        0.94 cm  LV e' medial:    7.51 cm/s LVOT diam:     2.16 cm  LV E/e' medial:  13.8 LV SV:         84.28 ml LV SV Index:   33.34 LVOT Area:     3.66 cm  RIGHT VENTRICLE RV S prime:     12.10 cm/s TAPSE (M-mode): 2.1 cm LEFT ATRIUM             Index       RIGHT ATRIUM           Index LA diam:        4.20 cm 2.12 cm/m  RA Area:     22.30 cm LA Vol (A2C):   49.5 ml 25.02 ml/m RA Volume:   59.30 ml  29.97 ml/m LA Vol (A4C):   55.4 ml 28.00 ml/m LA Biplane Vol: 52.5 ml 26.53 ml/m  AORTIC VALVE AV Area (Vmax):    1.27 cm AV Area (Vmean):   1.21 cm AV Area (VTI):     1.09 cm AV Vmax:           306.00 cm/s AV Vmean:          218.000 cm/s AV VTI:            0.772 m AV Peak Grad:      37.5 mmHg AV Mean Grad:      22.0 mmHg LVOT Vmax:         106.00 cm/s LVOT Vmean:        72.000 cm/s LVOT VTI:          0.230 m LVOT/AV VTI ratio: 0.30  AORTA Ao Root diam: 3.60 cm MITRAL VALVE MV Area (PHT): 2.09 cm              SHUNTS MV Decel Time: 363 msec              Systemic VTI:  0.23 m MV E velocity: 104.00 cm/s 103 cm/s  Systemic Diam: 2.16 cm MV A velocity: 125.00 cm/s 70.3 cm/s MV E/A ratio:  0.83        1.5 Loralie Champagne MD Electronically signed by Loralie Champagne MD Signature Date/Time: 02/18/2019/5:53:20 PM    Final    VAS [place] LOWER EXTREMITY VENOUS (DVT)  Result Date: 02/18/2019  Lower Venous DVT Study Indications: Pain, and Swelling.  Comparison Study: No prior study on file Performing Technologist: Sharion Dove RVS  Examination  Guidelines: A complete evaluation includes B-mode imaging, spectral Doppler, color Doppler, and power  Doppler as needed of all accessible portions of each vessel. Bilateral testing is considered an integral part of a complete examination. Limited examinations for reoccurring indications may be performed as noted. The reflux portion of the exam is performed with the patient in reverse Trendelenburg.  +---------+---------------+---------+-----------+----------+--------------+ RIGHT    CompressibilityPhasicitySpontaneityPropertiesThrombus Aging +---------+---------------+---------+-----------+----------+--------------+ CFV      Full           Yes      Yes                                 +---------+---------------+---------+-----------+----------+--------------+ SFJ      Full                                                        +---------+---------------+---------+-----------+----------+--------------+ FV Prox  Full                                                        +---------+---------------+---------+-----------+----------+--------------+ FV Mid   Full                                                        +---------+---------------+---------+-----------+----------+--------------+ FV DistalFull                                                        +---------+---------------+---------+-----------+----------+--------------+ PFV      Full                                                        +---------+---------------+---------+-----------+----------+--------------+ POP      Full           Yes      Yes                                 +---------+---------------+---------+-----------+----------+--------------+ PTV      Full                                                        +---------+---------------+---------+-----------+----------+--------------+ PERO     Full                                                        +---------+---------------+---------+-----------+----------+--------------+    +----+---------------+---------+-----------+----------+--------------+  LEFTCompressibilityPhasicitySpontaneityPropertiesThrombus Aging +----+---------------+---------+-----------+----------+--------------+ CFV Full           Yes      Yes                                 +----+---------------+---------+-----------+----------+--------------+     Summary: RIGHT: - There is no evidence of deep vein thrombosis in the lower extremity.  interstitial edema noted in calf  LEFT: - No evidence of common femoral vein obstruction.  *See table(s) above for measurements and observations. Electronically signed by Monica Martinez MD on 02/18/2019 at 4:17:05 PM.    Final     Microbiology: Recent Results (from the past 240 hour(s))  Blood Culture (routine x 2)     Status: None (Preliminary result)   Collection Time: 02/15/19  3:14 AM   Specimen: BLOOD LEFT FOREARM  Result Value Ref Range Status   Specimen Description BLOOD LEFT FOREARM  Final   Special Requests   Final    BOTTLES DRAWN AEROBIC AND ANAEROBIC Blood Culture results may not be optimal due to an inadequate volume of blood received in culture bottles   Culture   Final    NO GROWTH 2 DAYS Performed at [facility], [place] [address] [place], [place] [address]    Report Status PENDING  Incomplete  Blood Culture (routine x 2)     Status: None (Preliminary result)   Collection Time: 02/15/19  3:16 AM   Specimen: BLOOD RIGHT FOREARM  Result Value Ref Range Status   Specimen Description BLOOD RIGHT FOREARM  Final   Special Requests   Final    BOTTLES DRAWN AEROBIC AND ANAEROBIC Blood Culture results may not be optimal due to an inadequate volume of blood received in culture bottles   Culture   Final    NO GROWTH 2 DAYS Performed at [facility], [place] [address] [place], [place] [address]    Report Status PENDING  Incomplete  Respiratory Panel by RT PCR (Flu A&B, Covid) - Nasopharyngeal Swab     Status: None   Collection Time:  02/18/19  7:45 AM   Specimen: Nasopharyngeal Swab  Result Value Ref Range Status   SARS Coronavirus 2 by RT PCR NEGATIVE NEGATIVE Final    Comment: (NOTE) SARS-CoV-2 target nucleic acids are NOT DETECTED. The SARS-CoV-2 RNA is generally detectable in upper respiratoy specimens during the acute phase of infection. The lowest concentration of SARS-CoV-2 viral copies this assay can detect is 131 copies/mL. A negative result does not preclude SARS-Cov-2 infection and should not be used as the sole basis for treatment or other patient management decisions. A negative result may occur with  improper specimen collection/handling, submission of specimen other than nasopharyngeal swab, presence of viral mutation(s) within the areas targeted by this assay, and inadequate number of viral copies (<131 copies/mL). A negative result must be combined with clinical observations, patient history, and epidemiological information. The expected result is Negative. Fact Sheet for Patients:  PinkCheek.be Fact Sheet for Healthcare Providers:  GravelBags.it This test is not yet ap proved or cleared by the [place] FDA and  has been authorized for detection and/or diagnosis of SARS-CoV-2 by FDA under an Emergency Use Authorization (EUA). This EUA will remain  in effect (meaning this test can be used) for the duration of the COVID-19 declaration under Section 564(b)(1) of the Act, 21 U.S.C. section 360bbb-3(b)(1), unless the authorization is terminated or revoked sooner.  Influenza A by PCR NEGATIVE NEGATIVE Final   Influenza B by PCR NEGATIVE NEGATIVE Final    Comment: (NOTE) The Xpert Xpress SARS-CoV-2/FLU/RSV assay is intended as an aid in  the diagnosis of influenza from Nasopharyngeal swab specimens and  should not be used as a sole basis for treatment. Nasal washings and  aspirates are unacceptable for Xpert Xpress SARS-CoV-2/FLU/RSV    testing. Fact Sheet for Patients: PinkCheek.be Fact Sheet for Healthcare Providers: GravelBags.it This test is not yet approved or cleared by the [place] FDA and  has been authorized for detection and/or diagnosis of SARS-CoV-2 by  FDA under an Emergency Use Authorization (EUA). This EUA will remain  in effect (meaning this test can be used) for the duration of the  Covid-19 declaration under Section 564(b)(1) of the Act, 21  U.S.C. section 360bbb-3(b)(1), unless the authorization is  terminated or revoked. Performed at [facility], [place] [address], [place], [place] [address]      Labs: Basic Metabolic Panel: Recent Labs  Lab 02/18/19 0404  NA 149*  K 3.3*  CL 111  CO2 27  GLUCOSE 116*  BUN 12  CREATININE 1.03  CALCIUM 8.8*   Liver Function Tests: Recent Labs  Lab 02/18/19 0404  AST 24  ALT 34  ALKPHOS 101  BILITOT 0.7  PROT 5.8*  ALBUMIN 3.2*   No results for input(s): LIPASE, AMYLASE in the last 168 hours. No results for input(s): AMMONIA in the last 168 hours. CBC: Recent Labs  Lab 02/18/19 0404  WBC 9.3  NEUTROABS 6.6  HGB 15.0  HCT 47.3  MCV 95.6  PLT 223   Cardiac Enzymes: No results for input(s): CKTOTAL, CKMB, CKMBINDEX, TROPONINI in the last 168 hours. BNP: BNP (last 3 results) Recent Labs    02/15/19 0309 02/18/19 0404  BNP 218.1* 480.0*    ProBNP (last 3 results) No results for input(s): PROBNP in the last 8760 hours.  CBG: No results for input(s): GLUCAP in the last 168 hours.     Signed:  Karmen Bongo, MD [facility] 02/19/2019, 7:33 AM

## 2019-02-20 LAB — CULTURE, BLOOD (ROUTINE X 2)
Culture: NO GROWTH
Culture: NO GROWTH

## 2019-02-28 ENCOUNTER — Other Ambulatory Visit: Payer: Self-pay | Admitting: Neurology

## 2019-03-01 ENCOUNTER — Other Ambulatory Visit: Payer: Medicare Other

## 2019-03-13 ENCOUNTER — Ambulatory Visit
Admission: RE | Admit: 2019-03-13 | Discharge: 2019-03-13 | Disposition: A | Discharge status: Home / Self Care | Payer: Medicare Other | Source: Ambulatory Visit | Attending: Neurological Surgery | Admitting: Neurological Surgery

## 2019-03-13 ENCOUNTER — Other Ambulatory Visit: Payer: Self-pay

## 2019-03-13 DIAGNOSIS — M47816 Spondylosis without myelopathy or radiculopathy, lumbar region: Secondary | ICD-10-CM

## 2019-03-13 MED ORDER — GADOBENATE DIMEGLUMINE 529 MG/ML IV SOLN
15.0000 mL | Freq: Once | INTRAVENOUS | Status: AC | PRN
  Administered 2019-03-13: 15 mL via INTRAVENOUS

## 2019-03-18 ENCOUNTER — Other Ambulatory Visit: Payer: Self-pay

## 2019-03-20 ENCOUNTER — Other Ambulatory Visit: Payer: Self-pay

## 2019-03-22 ENCOUNTER — Telehealth: Payer: Self-pay | Admitting: Neurology

## 2019-03-22 NOTE — Telephone Encounter (Signed)
Patient called and requested a new prescription for gabapentin 300 MG be sent into [facility].

## 2019-03-25 ENCOUNTER — Other Ambulatory Visit: Payer: Self-pay

## 2019-03-25 MED ORDER — GABAPENTIN 300 MG PO CAPS: 300.0000 mg | ORAL_CAPSULE | Freq: Two times a day (BID) | ORAL | 5 refills | Status: DC

## 2019-05-02 ENCOUNTER — Other Ambulatory Visit: Payer: Self-pay

## 2019-05-02 ENCOUNTER — Ambulatory Visit
Admission: RE | Admit: 2019-05-02 | Discharge: 2019-05-02 | Disposition: A | Discharge status: Home / Self Care | Payer: Medicare Other | Source: Ambulatory Visit | Attending: Neurology | Admitting: Neurology

## 2019-05-02 DIAGNOSIS — R42 Dizziness and giddiness: Secondary | ICD-10-CM

## 2019-05-08 ENCOUNTER — Other Ambulatory Visit: Payer: Self-pay | Admitting: *Deleted

## 2019-05-08 DIAGNOSIS — I714 Abdominal aortic aneurysm, without rupture, unspecified: Secondary | ICD-10-CM

## 2019-05-13 ENCOUNTER — Ambulatory Visit (HOSPITAL_COMMUNITY): Payer: Medicare Other

## 2019-05-13 ENCOUNTER — Encounter: Payer: Medicare Other | Admitting: Surgery

## 2019-06-03 ENCOUNTER — Ambulatory Visit: Payer: Medicare Other | Admitting: Neurology

## 2019-06-24 ENCOUNTER — Encounter: Payer: Self-pay | Admitting: Surgery

## 2019-06-24 ENCOUNTER — Ambulatory Visit: Payer: Medicare Other | Admitting: Surgery

## 2019-06-24 ENCOUNTER — Other Ambulatory Visit: Payer: Self-pay

## 2019-06-24 ENCOUNTER — Ambulatory Visit (HOSPITAL_COMMUNITY)
Admission: RE | Admit: 2019-06-24 | Discharge: 2019-06-24 | Disposition: A | Discharge status: Home / Self Care | Payer: Medicare Other | Source: Ambulatory Visit | Attending: Surgery | Admitting: Surgery

## 2019-06-24 VITALS — BP 122/72 | HR 59 | Temp 98.0°F | Resp 20 | Ht 67.0 in | Wt 182.0 lb

## 2019-06-24 DIAGNOSIS — I714 Abdominal aortic aneurysm, without rupture, unspecified: Secondary | ICD-10-CM

## 2019-06-24 NOTE — Progress Notes (Signed)
[facility] [place]  Patient name: Rick Church MRN: [id number] DOB: [birth year] Sex: male   REQUESTING PROVIDER:    Aura Dials   REASON FOR CONSULT:    AAA  HISTORY OF PRESENT ILLNESS:   Rick Church is a 79 y.o. male, who is referred for evaluation of abdominal aortic aneurysm that was detected on a MRI for back pain. He denies any abdominal pain.  Patient does have history of COPD secondary to smoking. He has a history of stroke with no residual deficits. He suffers from coronary artery disease, status post MI. Cardiac cath was nonobstructive. He takes a statin for hypercholesterolemia. He is a current smoker.  PAST MEDICAL HISTORY    Past Medical History:  Diagnosis Date  . Acute respiratory distress syndrome (ARDS) (HCC)   . Anxiety   . Aortic stenosis    mild AS 01/2014 echo  . Arthritis   . Complication of anesthesia   . COPD (chronic obstructive pulmonary disease) ([place])   . GERD (gastroesophageal reflux disease)   . Headache   . High cholesterol   . History of hiatal hernia   . Hx of adenomatous colonic polyps   . Hypertension   . Legionnaire's disease ([place])   . Migraine   . Neck pain   . NSTEMI (non-ST elevated myocardial infarction) ([place])    12/2013; cath non-obstructive CAD  . Panic attack   . Pneumonia 2015  . PONV (postoperative nausea and vomiting)    Vomitted after last endo precodure   . 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 Stroke ([place])    hx of x 3 - no residual  -- patient was unaware of CVA     FAMILY HISTORY   Family History  Problem Relation Age of Onset  . Hypertension Mother   . Brain cancer Mother   . Lung cancer Mother   . Hypertension Brother   . Cervical cancer Sister   . Hypertension Father   . Cerebral aneurysm Father   . Colon cancer Neg Hx   . Esophageal cancer Neg Hx   . Inflammatory bowel disease Neg Hx   . Liver disease Neg Hx   . Pancreatic cancer Neg Hx   . Rectal  cancer Neg Hx   . Stomach cancer Neg Hx     SOCIAL HISTORY:   Social History   Socioeconomic History  . Marital status: Widowed    Spouse name: Not on file  . Number of children: 0  . Years of education: GED  . Highest education level: GED or equivalent  Occupational History  . Occupation: retired Clinical cytogeneticist  Tobacco Use  . Smoking status: Current Every Day Smoker    Packs/day: 2.00    Types: Cigarettes    Start date: 11/14/1956  . Smokeless tobacco: Former Systems developer    Types: Chew  . Tobacco comment: cut back to 1.5 ppd  Vaping Use  . Vaping Use: Never used  Substance and Sexual Activity  . Alcohol use: No    Alcohol/week: 0.0 standard drinks  . Drug use: No  . Sexual activity: Not on file  Other Topics Concern  . Not on file  Social History Narrative   Lives at home alone.   Right-handed.   Drinks approximately 1 liter of Dr. Malachi Bonds per day.   Social Determinants of Health   Financial Resource Strain:   . Difficulty of Paying Living Expenses:   Food Insecurity:   . Worried About Estate manager/land agent  of Food in the Last Year:   . [place] in the Last Year:   Transportation Needs:   . Lack of Transportation (Medical):   Marland Kitchen Lack of Transportation (Non-Medical):   Physical Activity:   . Days of Exercise per Week:   . Minutes of Exercise per Session:   Stress:   . Feeling of Stress :   Social Connections:   . Frequency of Communication with Friends and Family:   . Frequency of Social Gatherings with Friends and Family:   . Attends Religious Services:   . Active Member of Clubs or Organizations:   . Attends Archivist Meetings:   Marland Kitchen Marital Status:   Intimate Partner Violence:   . Fear of Current or Ex-Partner:   . Emotionally Abused:   Marland Kitchen Physically Abused:   . Sexually Abused:     ALLERGIES:    Allergies  Allergen Reactions  . Atorvastatin Other (See Comments)    leg myalgias  . Indocin [Indomethacin] Nausea Only and Other (See Comments)     dizzy  . Penicillins Hives and Other (See Comments)    Tolerated a dose of CEFEPIME 12/31/13 Did it involve swelling of the face/tongue/throat, SOB, or low BP? No Did it involve sudden or severe rash/hives, skin peeling, or any reaction on the inside of your mouth or nose? Yes Did you need to seek medical attention at a hospital or doctor's office? No When did it last happen?1961 If all above answers are "NO", may proceed with cephalosporin use.   Marland Kitchen Buspirone Other (See Comments)    UNSPECIFIED INTOLERANCE  . Propofol Nausea And Vomiting    Severe vomiting with anesthesia   . Restoril [Temazepam] Other (See Comments)    UNSPECIFIED INTOLERANCE  . Toprol Xl [Metoprolol Tartrate] Other (See Comments)    UNSPECIFIED REACTION    . Asa [Aspirin] Itching and Rash  . Codeine Nausea And Vomiting  . Hytrin [Terazosin] Nausea And Vomiting       . Neurontin [Gabapentin] Nausea And Vomiting       . Oxycodone Nausea Only and Other (See Comments)    "Deathly sick"  . Zestril [Lisinopril] Other (See Comments)    UNSPECIFIED SPECIFIC REACTION  Makes him feel really bad, doesn't feel like getting up in the morning     CURRENT MEDICATIONS:    Current Outpatient Medications  Medication Sig Dispense Refill  . acetaminophen (TYLENOL) 500 MG tablet Take 500 mg by mouth every 6 (six) hours as needed for moderate pain or headache.     Marland Kitchen amLODipine (NORVASC) 10 MG tablet Take 10 mg by mouth at bedtime. (2100)  1  . aspirin EC 81 MG tablet Take 81 mg by mouth at bedtime. (2100)    . butalbital-acetaminophen-caffeine (FIORICET) 50-325-40 MG tablet TAKE 1 TABLET BY MOUTH EVERY 6 HOURS AS NEEDED FOR MIGRAINE 10 tablet 5  . CAMBIA 50 MG PACK TAKE AS NEEDED FOR SEVERE MIGRAINE (Patient taking differently: Take 1 each by mouth daily as needed (migraine). ) 9 each 11  . citalopram (CELEXA) 40 MG tablet Take 40 mg by mouth at bedtime.    . clonazePAM (KLONOPIN) 2 MG tablet Take 1 tablet (2 mg total) by  mouth 2 (two) times daily as needed for anxiety. (Patient taking differently: Take 2 mg by mouth at bedtime. ) 30 tablet 0  . diazepam (VALIUM) 2 MG tablet Take 2 mg by mouth every 6 (six) hours as needed for anxiety.     Marland Kitchen  gabapentin (NEURONTIN) 300 MG capsule Take 1 capsule (300 mg total) by mouth 2 (two) times daily. 60 capsule 5  . ibuprofen (ADVIL,MOTRIN) 200 MG tablet Take 400 mg by mouth every 8 (eight) hours as needed for headache or moderate pain.     Marland Kitchen irbesartan (AVAPRO) 300 MG tablet Take 300 mg by mouth at bedtime. (2100)  2  . meclizine (ANTIVERT) 25 MG tablet Take 50-150 mg by mouth See admin instructions. Take 2-3 tablets by mouth scheduled in the morning & take 5-6 tablets by mouth scheduled at night; take 2-3 tablets if needed for additional dizziness or nausea.    . Multiple Vitamin (MULTIVITAMIN WITH MINERALS) TABS tablet Take 1 tablet by mouth at bedtime. (2100)    . pantoprazole (PROTONIX) 40 MG tablet Take 1 tablet (40 mg total) by mouth daily. (Patient taking differently: Take 40 mg by mouth daily as needed (heartburn). ) 30 tablet 0  . pregabalin (LYRICA) 75 MG capsule TAKE 1 CAPSULE BY MOUTH THREE TIMES DAILY AS NEEDED FOR PAIN (Patient taking differently: Take 75 mg by mouth 3 (three) times daily as needed (for pain). ) 270 capsule 5  . simvastatin (ZOCOR) 80 MG tablet Take 40 mg by mouth at bedtime. (2100)    . traZODone (DESYREL) 100 MG tablet Take 100 mg by mouth at bedtime.   6   No current facility-administered medications for this visit.    REVIEW OF SYSTEMS:   [X]  denotes positive finding, [ ]  denotes negative finding Cardiac  Comments:  Chest pain or chest pressure:    Shortness of breath upon exertion:    Short of breath when lying flat:    Irregular heart rhythm:        Vascular    Pain in calf, thigh, or hip brought on by ambulation:    Pain in feet at night that wakes you up from your sleep:     Blood clot in your veins:    Leg swelling:           Pulmonary    Oxygen at home:    Productive cough:     Wheezing:         Neurologic    Sudden weakness in arms or legs:     Sudden numbness in arms or legs:     Sudden onset of difficulty speaking or slurred speech:    Temporary loss of vision in one eye:     Problems with dizziness:  x       Gastrointestinal    Blood in stool:      Vomited blood:         Genitourinary    Burning when urinating:     Blood in urine:        Psychiatric    Major depression:         Hematologic    Bleeding problems:    Problems with blood clotting too easily:        Skin    Rashes or ulcers:        Constitutional    Fever or chills:     PHYSICAL EXAM:   Vitals:   06/24/19 0934  BP: 122/72  Pulse: (!) 59  Resp: 20  Temp: 98 F (36.7 C)  SpO2: 92%  Weight: 182 lb (82.6 kg)  Height: 5\' 7"  (1.702 m)    GENERAL: The patient is a well-nourished male, in no acute distress. The vital signs are documented above. CARDIAC: There is a  regular rate and rhythm.  VASCULAR: Nonpalpable pedal or popliteal pulses PULMONARY: Nonlabored respirations ABDOMEN: Soft and non-tender with normal pitched bowel sounds.  MUSCULOSKELETAL: There are no major deformities or cyanosis. NEUROLOGIC: No focal weakness or paresthesias are detected. SKIN: There are no ulcers or rashes noted. PSYCHIATRIC: The patient has a normal affect.  STUDIES:   I have reviewed her MRI with the following findings:  1. Progressive moderately severe spinal stenosis at L3-4 due to a combination of a broad-based disc protrusion and hypertrophy of the ligamentum flavum and facet joints. 2. Moderate spinal stenosis at L2-3 and L4-5, unchanged. 3. Chronic bilateral foraminal stenosis at L4-5, right greater than left, unchanged. 4. 4.3 cm fusiform aneurysm of the distal abdominal aorta, increased from 3.6 mm on the prior MRI of 2017. Recommend followup by ultrasound in 1 year. This recommendation follows ACR  consensus guidelines: White Paper of the ACR Incidental Findings Committee II on Vascular Findings. J Am Coll Radiol 2013; 10:789-794. Aortic aneurysm NOS (ICD10-I71.9) :   Abdominal ultrasound: Abdominal Aorta: There is evidence of abnormal dilatation of the mid  Abdominal aorta. The largest aortic measurement is 4.8 cm. The largest  aortic diameter has increased compared to prior exam. Previous diameter  measurement was 3.5 cm obtained on  12/07/2015.  Stenosis: +-----------------+-------------+  Location     Stenosis     +-----------------+-------------+  Left Common Iliac>50% stenosis  +-----------------+-------------+  ASSESSMENT and PLAN   AAA: Maximum aortic diameter was 4.4 cm by MRI and 4.8 cm by ultrasound. We discussed the natural history of aneurysmal degeneration of the aorta and the indications for repair. I will have him follow-up with me in 6 months. At that time I will get a CT angiogram to better define his anatomy and determine the most appropriate method of treatment once he reaches size criteria. I will also get a carotid duplex given his history of stroke   Leia Alf, MD, FACS [facility] [phone] Pager [phone]

## 2019-06-26 ENCOUNTER — Other Ambulatory Visit: Payer: Self-pay | Admitting: *Deleted

## 2019-06-26 DIAGNOSIS — R0989 Other specified symptoms and signs involving the circulatory and respiratory systems: Secondary | ICD-10-CM

## 2019-08-05 NOTE — Progress Notes (Deleted)
NEUROLOGY FOLLOW UP OFFICE NOTE  Rick Church [id number]  HISTORY OF PRESENT ILLNESS: Rick Church is a61year old male with COPD, HTN, CAD s/p NSTEMI, arthritis and history of Legionnaire's pneumonia who follows up for dizziness and migraines.  UPDATE: To rule out cervicogenic component to dizziness, MRI cervical spine on 05/02/2019 personally reviewed was performed and demonstrated no significant change compared to prior study from August 2017, with slight bilateral facet arthritis at C3-4.  Dizziness is still an issue.  When he turns his head to the left, he notes moderate spinning and ringing in the ears.  It lasts as long as he keeps his head turned left.  It does not occur with any other neck or head position.  It doesn't happen every time he turns his head left but it occurs 2 or 3 times a day.  He takes meclizine and Zofran for episodes.    HISTORY: Since January or February 2018, he has not been feeling well. He reported onset of dizziness. He has prior history of vertigo, but this was different. He described this dizziness as feeling "drunk". There was a sense of movement but not spinning. It was not positional. He also endorsed nausea and abdominal discomfort. He was found to have gallstones. After they were removed in early July, the dizziness and nausea became worse. Prior to surgery, they were episodic, where he would have 3 or 4 days of no dizziness at a time. After the surgery, it was constant. He has had falls due to unsteadiness. He sometimes noted sunspots and double vision. He denied hearing loss but has chronic tinnitus. He denied slurred speech, facial droop or unilateral numbness or weakness. Meclizine was ineffective. Zofran did not help with nausea. He tried the Epley maneuver, which helped with his previous vertigo, but it was ineffective. CT of head from 09/04/16 was personally reviewed and revealed signs of cerebrovascular disease but no acute  findings or mass lesions. He takes Lyrica once daily for neuralgia. In September 2018, he started taking it 3 times daily (as originally prescribed) and the dizziness and nausea resolved.It subsequently returned. He later was started on gabapentin 300mg  at bedtime and underwent vestibular rehab and dizziness again resolved. CTA of head was performed on 10/11/16, which was personally reviewed and revealed no significant intracranial stenosis or occlusion, including the vertebrobasilar system.   He has significant stroke risk factors. He has prior strokes. He is a smoker. He has hypertension and CAD. He takes ASA 81mg  daily.  He also has longstanding history of migrainessince the early 1960s, described as severe and pounding, beginning from back of neck and involving the front of his head. Associated nausea.They would occur daily. Stress is trigger. Fioricet helps relieve it.Past medication includes nortriptyline. Cambia was effective in the past.  PAST MEDICAL HISTORY: Past Medical History:  Diagnosis Date  . Acute respiratory distress syndrome (ARDS) ([facility])   . Anxiety   . Aortic stenosis    mild AS 01/2014 echo  . Arthritis   . Complication of anesthesia   . COPD (chronic obstructive pulmonary disease) ([place])   . GERD (gastroesophageal reflux disease)   . Headache   . High cholesterol   . History of hiatal hernia   . Hx of adenomatous colonic polyps   . Hypertension   . Legionnaire's disease ([place])   . Migraine   . Neck pain   . NSTEMI (non-ST elevated myocardial infarction) ([place])    12/2013; cath non-obstructive CAD  . Panic  attack   . Pneumonia 2015  . PONV (postoperative nausea and vomiting)    Vomitted after last endo precodure   . 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 Stroke ([place])    hx of x 3 - no residual  -- patient was unaware of CVA    MEDICATIONS: Current Outpatient Medications on File Prior to Visit  Medication Sig Dispense Refill  . acetaminophen (TYLENOL) 500 MG  tablet Take 500 mg by mouth every 6 (six) hours as needed for moderate pain or headache.     Marland Kitchen amLODipine (NORVASC) 10 MG tablet Take 10 mg by mouth at bedtime. (2100)  1  . aspirin EC 81 MG tablet Take 81 mg by mouth at bedtime. (2100)    . butalbital-acetaminophen-caffeine (FIORICET) 50-325-40 MG tablet TAKE 1 TABLET BY MOUTH EVERY 6 HOURS AS NEEDED FOR MIGRAINE 10 tablet 5  . CAMBIA 50 MG PACK TAKE AS NEEDED FOR SEVERE MIGRAINE (Patient taking differently: Take 1 each by mouth daily as needed (migraine). ) 9 each 11  . citalopram (CELEXA) 40 MG tablet Take 40 mg by mouth at bedtime.    . clonazePAM (KLONOPIN) 2 MG tablet Take 1 tablet (2 mg total) by mouth 2 (two) times daily as needed for anxiety. (Patient taking differently: Take 2 mg by mouth at bedtime. ) 30 tablet 0  . diazepam (VALIUM) 2 MG tablet Take 2 mg by mouth every 6 (six) hours as needed for anxiety.     . gabapentin (NEURONTIN) 300 MG capsule Take 1 capsule (300 mg total) by mouth 2 (two) times daily. 60 capsule 5  . ibuprofen (ADVIL,MOTRIN) 200 MG tablet Take 400 mg by mouth every 8 (eight) hours as needed for headache or moderate pain.     Marland Kitchen irbesartan (AVAPRO) 300 MG tablet Take 300 mg by mouth at bedtime. (2100)  2  . meclizine (ANTIVERT) 25 MG tablet Take 50-150 mg by mouth See admin instructions. Take 2-3 tablets by mouth scheduled in the morning & take 5-6 tablets by mouth scheduled at night; take 2-3 tablets if needed for additional dizziness or nausea.    . Multiple Vitamin (MULTIVITAMIN WITH MINERALS) TABS tablet Take 1 tablet by mouth at bedtime. (2100)    . pantoprazole (PROTONIX) 40 MG tablet Take 1 tablet (40 mg total) by mouth daily. (Patient taking differently: Take 40 mg by mouth daily as needed (heartburn). ) 30 tablet 0  . pregabalin (LYRICA) 75 MG capsule TAKE 1 CAPSULE BY MOUTH THREE TIMES DAILY AS NEEDED FOR PAIN (Patient taking differently: Take 75 mg by mouth 3 (three) times daily as needed (for pain). ) 270  capsule 5  . simvastatin (ZOCOR) 80 MG tablet Take 40 mg by mouth at bedtime. (2100)    . traZODone (DESYREL) 100 MG tablet Take 100 mg by mouth at bedtime.   6   No current facility-administered medications on file prior to visit.    ALLERGIES: Allergies  Allergen Reactions  . Atorvastatin Other (See Comments)    leg myalgias  . Indocin [Indomethacin] Nausea Only and Other (See Comments)    dizzy  . Penicillins Hives and Other (See Comments)    Tolerated a dose of CEFEPIME 12/31/13 Did it involve swelling of the face/tongue/throat, SOB, or low BP? No Did it involve sudden or severe rash/hives, skin peeling, or any reaction on the inside of your mouth or nose? Yes Did you need to seek medical attention at a hospital or doctor's office? No When did it last happen?1961  If all above answers are "NO", may proceed with cephalosporin use.   Marland Kitchen Buspirone Other (See Comments)    UNSPECIFIED INTOLERANCE  . Propofol Nausea And Vomiting    Severe vomiting with anesthesia   . Restoril [Temazepam] Other (See Comments)    UNSPECIFIED INTOLERANCE  . Toprol Xl [Metoprolol Tartrate] Other (See Comments)    UNSPECIFIED REACTION    . Asa [Aspirin] Itching and Rash  . Codeine Nausea And Vomiting  . Hytrin [Terazosin] Nausea And Vomiting       . Neurontin [Gabapentin] Nausea And Vomiting       . Oxycodone Nausea Only and Other (See Comments)    "Deathly sick"  . Zestril [Lisinopril] Other (See Comments)    UNSPECIFIED SPECIFIC REACTION  Makes him feel really bad, doesn't feel like getting up in the morning     FAMILY HISTORY: Family History  Problem Relation Age of Onset  . Hypertension Mother   . Brain cancer Mother   . Lung cancer Mother   . Hypertension Brother   . Cervical cancer Sister   . Hypertension Father   . Cerebral aneurysm Father   . Colon cancer Neg Hx   . Esophageal cancer Neg Hx   . Inflammatory bowel disease Neg Hx   . Liver disease Neg Hx   . Pancreatic  cancer Neg Hx   . Rectal cancer Neg Hx   . Stomach cancer Neg Hx    ***.  SOCIAL HISTORY: Social History   Socioeconomic History  . Marital status: Widowed    Spouse name: Not on file  . Number of children: 0  . Years of education: GED  . Highest education level: GED or equivalent  Occupational History  . Occupation: retired Clinical cytogeneticist  Tobacco Use  . Smoking status: Current Every Day Smoker    Packs/day: 2.00    Types: Cigarettes    Start date: 11/14/1956  . Smokeless tobacco: Former Systems developer    Types: Chew  . Tobacco comment: cut back to 1.5 ppd  Vaping Use  . Vaping Use: Never used  Substance and Sexual Activity  . Alcohol use: No    Alcohol/week: 0.0 standard drinks  . Drug use: No  . Sexual activity: Not on file  Other Topics Concern  . Not on file  Social History Narrative   Lives at home alone.   Right-handed.   Drinks approximately 1 liter of Dr. Malachi Bonds per day.   Social Determinants of Health   Financial Resource Strain:   . Difficulty of Paying Living Expenses:   Food Insecurity:   . Worried About Charity fundraiser in the Last Year:   . Arboriculturist in the Last Year:   Transportation Needs:   . Film/video editor (Medical):   Marland Kitchen Lack of Transportation (Non-Medical):   Physical Activity:   . Days of Exercise per Week:   . Minutes of Exercise per Session:   Stress:   . Feeling of Stress :   Social Connections:   . Frequency of Communication with Friends and Family:   . Frequency of Social Gatherings with Friends and Family:   . Attends Religious Services:   . Active Member of Clubs or Organizations:   . Attends Archivist Meetings:   Marland Kitchen Marital Status:   Intimate Partner Violence:   . Fear of Current or Ex-Partner:   . Emotionally Abused:   Marland Kitchen Physically Abused:   . Sexually Abused:     REVIEW  OF SYSTEMS: Constitutional: No fevers, chills, or sweats, no generalized fatigue, change in appetite Eyes: No visual changes, double  vision, eye pain Ear, nose and throat: No hearing loss, ear pain, nasal congestion, sore throat Cardiovascular: No chest pain, palpitations Respiratory:  No shortness of breath at rest or with exertion, wheezes GastrointestinaI: No nausea, vomiting, diarrhea, abdominal pain, fecal incontinence Genitourinary:  No dysuria, urinary retention or frequency Musculoskeletal:  No neck pain, back pain Integumentary: No rash, pruritus, skin lesions Neurological: as above Psychiatric: No depression, insomnia, anxiety Endocrine: No palpitations, fatigue, diaphoresis, mood swings, change in appetite, change in weight, increased thirst Hematologic/Lymphatic:  No purpura, petechiae. Allergic/Immunologic: no itchy/runny eyes, nasal congestion, recent allergic reactions, rashes  PHYSICAL EXAM: *** General: No acute distress.  Patient appears ***-groomed.   Head:  Normocephalic/atraumatic Eyes:  Fundi examined but not visualized Neck: supple, no paraspinal tenderness, full range of motion Heart:  Regular rate and rhythm Lungs:  Clear to auscultation bilaterally Back: No paraspinal tenderness Neurological Exam: alert and oriented to person, place, and time. Attention span and concentration intact, recent and remote memory intact, fund of knowledge intact.  Speech fluent and not dysarthric, language intact.  CN II-XII intact. Bulk and tone normal, muscle strength 5/5 throughout.  Sensation to light touch, temperature and vibration intact.  Deep tendon reflexes 2+ throughout, toes downgoing.  Finger to nose and heel to shin testing intact.  Gait normal, Romberg negative.  IMPRESSION: ***  PLAN: ***  Metta Clines, DO  CC: ***

## 2019-08-06 ENCOUNTER — Ambulatory Visit: Payer: Medicare Other | Admitting: Neurology

## 2019-08-07 ENCOUNTER — Other Ambulatory Visit: Payer: Self-pay | Admitting: Neurology

## 2019-08-07 ENCOUNTER — Other Ambulatory Visit: Payer: Self-pay

## 2019-08-12 ENCOUNTER — Ambulatory Visit: Payer: Medicare Other | Admitting: Surgery

## 2019-08-12 ENCOUNTER — Encounter (HOSPITAL_COMMUNITY): Payer: Medicare Other

## 2019-08-21 ENCOUNTER — Other Ambulatory Visit: Payer: Self-pay

## 2019-08-21 ENCOUNTER — Emergency Department (HOSPITAL_COMMUNITY): Payer: Medicare Other

## 2019-08-21 ENCOUNTER — Emergency Department (HOSPITAL_COMMUNITY)
Admission: EM | Admit: 2019-08-21 | Discharge: 2019-08-21 | Disposition: A | Discharge status: Home / Self Care | Payer: Medicare Other | Attending: Emergency Medicine | Admitting: Emergency Medicine

## 2019-08-21 ENCOUNTER — Encounter (HOSPITAL_COMMUNITY): Payer: Self-pay | Admitting: Emergency Medicine

## 2019-08-21 DIAGNOSIS — R202 Paresthesia of skin: Secondary | ICD-10-CM | POA: Insufficient documentation

## 2019-08-21 DIAGNOSIS — J449 Chronic obstructive pulmonary disease, unspecified: Secondary | ICD-10-CM | POA: Insufficient documentation

## 2019-08-21 DIAGNOSIS — Z5321 Procedure and treatment not carried out due to patient leaving prior to being seen by health care provider: Secondary | ICD-10-CM | POA: Diagnosis not present

## 2019-08-21 DIAGNOSIS — W108XXA Fall (on) (from) other stairs and steps, initial encounter: Secondary | ICD-10-CM | POA: Insufficient documentation

## 2019-08-21 LAB — CBC
HCT: 44.7 % (ref 39.0–52.0)
Hemoglobin: 14.5 g/dL (ref 13.0–17.0)
MCH: 30.1 pg (ref 26.0–34.0)
MCHC: 32.4 g/dL (ref 30.0–36.0)
MCV: 92.7 fL (ref 80.0–100.0)
Platelets: 227 10*3/uL (ref 150–400)
RBC: 4.82 MIL/uL (ref 4.22–5.81)
RDW: 16.3 % — ABNORMAL HIGH (ref 11.5–15.5)
WBC: 14.5 10*3/uL — ABNORMAL HIGH (ref 4.0–10.5)
nRBC: 0 % (ref 0.0–0.2)

## 2019-08-21 LAB — ETHANOL: Alcohol, Ethyl (B): <10 mg/dL (ref <10)

## 2019-08-21 LAB — I-STAT CHEM 8, ED
BUN: 21 mg/dL (ref 8–23)
Calcium, Ion: 1.12 mmol/L — ABNORMAL LOW (ref 1.15–1.40)
Chloride: 106 mmol/L (ref 98–111)
Creatinine, Ser: 1.1 mg/dL (ref 0.61–1.24)
Glucose, Bld: 95 mg/dL (ref 70–99)
HCT: 43 % (ref 39.0–52.0)
Hemoglobin: 14.6 g/dL (ref 13.0–17.0)
Potassium: 3.6 mmol/L (ref 3.5–5.1)
Sodium: 144 mmol/L (ref 135–145)
TCO2: 28 mmol/L (ref 22–32)

## 2019-08-21 LAB — COMPREHENSIVE METABOLIC PANEL
ALT: 35 U/L (ref 0–44)
AST: 33 U/L (ref 15–41)
Albumin: 3.6 g/dL (ref 3.5–5.0)
Alkaline Phosphatase: 96 U/L (ref 38–126)
Anion gap: 10 (ref 5–15)
BUN: 18 mg/dL (ref 8–23)
CO2: 27 mmol/L (ref 22–32)
Calcium: 8.7 mg/dL — ABNORMAL LOW (ref 8.9–10.3)
Chloride: 106 mmol/L (ref 98–111)
Creatinine, Ser: 1.16 mg/dL (ref 0.61–1.24)
GFR calc Af Amer: >60 mL/min (ref >60)
GFR calc non Af Amer: >60 mL/min (ref >60)
Glucose, Bld: 104 mg/dL — ABNORMAL HIGH (ref 70–99)
Potassium: 3.6 mmol/L (ref 3.5–5.1)
Sodium: 143 mmol/L (ref 135–145)
Total Bilirubin: 0.3 mg/dL (ref 0.3–1.2)
Total Protein: 5.7 g/dL — ABNORMAL LOW (ref 6.5–8.1)

## 2019-08-21 LAB — SAMPLE TO BLOOD BANK

## 2019-08-21 LAB — LACTIC ACID, PLASMA: Lactic Acid, Venous: 1.2 mmol/L (ref 0.5–1.9)

## 2019-08-21 LAB — PROTIME-INR
INR: 1.1 (ref 0.8–1.2)
Prothrombin Time: 13.6 seconds (ref 11.4–15.2)

## 2019-08-21 NOTE — ED Notes (Signed)
Pt came to this staff member yelling and using profanity, voicing his concerns about the wait time and the reason we will not treat him. The pt was educated on the proper protocol for receiving treatment in the ED. Pt states he left the ED to go home and get his own pain medication and it was no longer working. Pt demanded that we give him something for his pain. Pt was asked to have a seat and quietly wait for his name to be called so a provider could assess him. Pt continues to yell at staff using profanity. Pt refuses to have a seat. Pt was asked to leave the ED if he was unable to follow directions. Pt rips the bractlet off and throws it at this staff member. Pt was seen leaving this facility followed by security.

## 2019-08-21 NOTE — ED Triage Notes (Addendum)
Patient fell approx 10 feet off his porch, landing on a piece of plywood on Monday evening.  Patient states that his pain has progressively gotten worse and he has had multiple neck surgeries with Dr Ellene Route.  Patient has been placed into [place] J collar.  Patient does have numbness in his hands bilaterally.  Patient also with COPD history, oxygen only at 90%.

## 2019-08-22 ENCOUNTER — Emergency Department (HOSPITAL_COMMUNITY)
Admission: EM | Admit: 2019-08-22 | Discharge: 2019-08-22 | Disposition: A | Discharge status: Home / Self Care | Payer: Medicare Other | Attending: Emergency Medicine | Admitting: Emergency Medicine

## 2019-08-22 ENCOUNTER — Other Ambulatory Visit: Payer: Self-pay

## 2019-08-22 ENCOUNTER — Emergency Department (HOSPITAL_COMMUNITY): Payer: Medicare Other

## 2019-08-22 ENCOUNTER — Encounter (HOSPITAL_COMMUNITY): Payer: Self-pay | Admitting: Emergency Medicine

## 2019-08-22 DIAGNOSIS — Z79899 Other long term (current) drug therapy: Secondary | ICD-10-CM | POA: Insufficient documentation

## 2019-08-22 DIAGNOSIS — I5032 Chronic diastolic (congestive) heart failure: Secondary | ICD-10-CM | POA: Diagnosis not present

## 2019-08-22 DIAGNOSIS — Y999 Unspecified external cause status: Secondary | ICD-10-CM | POA: Insufficient documentation

## 2019-08-22 DIAGNOSIS — I2510 Atherosclerotic heart disease of native coronary artery without angina pectoris: Secondary | ICD-10-CM | POA: Diagnosis not present

## 2019-08-22 DIAGNOSIS — W19XXXA Unspecified fall, initial encounter: Secondary | ICD-10-CM | POA: Diagnosis not present

## 2019-08-22 DIAGNOSIS — F09 Unspecified mental disorder due to known physiological condition: Secondary | ICD-10-CM | POA: Insufficient documentation

## 2019-08-22 DIAGNOSIS — M542 Cervicalgia: Secondary | ICD-10-CM | POA: Insufficient documentation

## 2019-08-22 DIAGNOSIS — Y929 Unspecified place or not applicable: Secondary | ICD-10-CM | POA: Diagnosis not present

## 2019-08-22 DIAGNOSIS — I110 Hypertensive heart disease with heart failure: Secondary | ICD-10-CM | POA: Insufficient documentation

## 2019-08-22 DIAGNOSIS — R454 Irritability and anger: Secondary | ICD-10-CM | POA: Insufficient documentation

## 2019-08-22 DIAGNOSIS — J449 Chronic obstructive pulmonary disease, unspecified: Secondary | ICD-10-CM | POA: Insufficient documentation

## 2019-08-22 DIAGNOSIS — J189 Pneumonia, unspecified organism: Secondary | ICD-10-CM | POA: Diagnosis not present

## 2019-08-22 DIAGNOSIS — Y939 Activity, unspecified: Secondary | ICD-10-CM | POA: Insufficient documentation

## 2019-08-22 DIAGNOSIS — Z7982 Long term (current) use of aspirin: Secondary | ICD-10-CM | POA: Insufficient documentation

## 2019-08-22 DIAGNOSIS — F17210 Nicotine dependence, cigarettes, uncomplicated: Secondary | ICD-10-CM | POA: Diagnosis not present

## 2019-08-22 LAB — CBC WITH DIFFERENTIAL/PLATELET
Abs Immature Granulocytes: 0.1 10*3/uL — ABNORMAL HIGH (ref 0.00–0.07)
Basophils Absolute: 0.1 10*3/uL (ref 0.0–0.1)
Basophils Relative: 1 %
Eosinophils Absolute: 0.1 10*3/uL (ref 0.0–0.5)
Eosinophils Relative: 1 %
HCT: 42.7 % (ref 39.0–52.0)
Hemoglobin: 13.9 g/dL (ref 13.0–17.0)
Immature Granulocytes: 1 %
Lymphocytes Relative: 12 %
Lymphs Abs: 1.8 10*3/uL (ref 0.7–4.0)
MCH: 30.2 pg (ref 26.0–34.0)
MCHC: 32.6 g/dL (ref 30.0–36.0)
MCV: 92.8 fL (ref 80.0–100.0)
Monocytes Absolute: 1.4 10*3/uL — ABNORMAL HIGH (ref 0.1–1.0)
Monocytes Relative: 9 %
Neutro Abs: 11.8 10*3/uL — ABNORMAL HIGH (ref 1.7–7.7)
Neutrophils Relative %: 76 %
Platelets: 209 10*3/uL (ref 150–400)
RBC: 4.6 MIL/uL (ref 4.22–5.81)
RDW: 16.6 % — ABNORMAL HIGH (ref 11.5–15.5)
WBC: 15.3 10*3/uL — ABNORMAL HIGH (ref 4.0–10.5)
nRBC: 0 % (ref 0.0–0.2)

## 2019-08-22 LAB — COMPREHENSIVE METABOLIC PANEL
ALT: 29 U/L (ref 0–44)
AST: 22 U/L (ref 15–41)
Albumin: 3.9 g/dL (ref 3.5–5.0)
Alkaline Phosphatase: 86 U/L (ref 38–126)
Anion gap: 10 (ref 5–15)
BUN: 17 mg/dL (ref 8–23)
CO2: 22 mmol/L (ref 22–32)
Calcium: 8.3 mg/dL — ABNORMAL LOW (ref 8.9–10.3)
Chloride: 110 mmol/L (ref 98–111)
Creatinine, Ser: 1.09 mg/dL (ref 0.61–1.24)
GFR calc Af Amer: >60 mL/min (ref >60)
GFR calc non Af Amer: >60 mL/min (ref >60)
Glucose, Bld: 84 mg/dL (ref 70–99)
Potassium: 3.7 mmol/L (ref 3.5–5.1)
Sodium: 142 mmol/L (ref 135–145)
Total Bilirubin: 0.6 mg/dL (ref 0.3–1.2)
Total Protein: 6.2 g/dL — ABNORMAL LOW (ref 6.5–8.1)

## 2019-08-22 LAB — SALICYLATE LEVEL: Salicylate Lvl: <7 mg/dL — ABNORMAL LOW (ref 7.0–30.0)

## 2019-08-22 LAB — ETHANOL: Alcohol, Ethyl (B): <10 mg/dL (ref <10)

## 2019-08-22 LAB — ACETAMINOPHEN LEVEL
Acetaminophen (Tylenol), Serum: 24 ug/mL (ref 10–30)
Acetaminophen (Tylenol), Serum: 12 ug/mL (ref 10–30)

## 2019-08-22 MED ORDER — ALBUTEROL SULFATE HFA 108 (90 BASE) MCG/ACT IN AERS: 4.0000 | INHALATION_SPRAY | Freq: Once | RESPIRATORY_TRACT | Status: DC

## 2019-08-22 MED ORDER — PREDNISONE 20 MG PO TABS: 60.0000 mg | ORAL_TABLET | Freq: Once | ORAL | Status: DC

## 2019-08-22 MED ORDER — DOXYCYCLINE HYCLATE 100 MG PO CAPS
100.0000 mg | ORAL_CAPSULE | Freq: Two times a day (BID) | ORAL | 0 refills | Status: AC
Start: 2019-08-22 — End: 2019-09-01

## 2019-08-22 NOTE — ED Notes (Signed)
Patient requesting to stand up to urinate, charge nurse Bobby aware due to patients verbal aggression. RN Mortimer Fries and this nurse entered the room offering to assist patient out of bed to use the urinal. Patient began yelling and cursing at staff, to which staff requesting patient to refrain from speaking to staff that way. Patient continues to verbally abuse staff and stating he will not allow [place] to help demanding for staff to leave the room. Patient refusing to allow staff to assist patient.
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
## 2019-08-22 NOTE — ED Triage Notes (Signed)
Arrives via EMS from home. He called his MD office and told them that he took 36 lyrica pills to help with his neck pain/kill himself to end the neck pain. Drew a gun on EMS. Patient is IVC per PCP office, has been cursing and screaming since he got here. Patient had a fall and was seen at [facility] on Tuesday, wanted some pain medications and did not receive them. 5 mg Versed given, 2.5 mg Haldol given IM, no effect.

## 2019-08-22 NOTE — ED Triage Notes (Signed)
Per PC they said the patient should be drowsy if he took 20-60 Lyrica. Recommend Benzos for safety, acetaminophen level and then observe until he is back to baseline.

## 2019-08-22 NOTE — ED Notes (Signed)
Patient refuses to wear oxygen and refuses any medications.

## 2019-09-13 ENCOUNTER — Other Ambulatory Visit: Payer: Self-pay | Admitting: Neurology

## 2019-09-13 NOTE — Telephone Encounter (Signed)
Patient states that he was late for his appt the other week and had to resch his appt. He has appt for 11-07-19 and needs a refill on the lyrica until he can be seen by out office.  He would like to speak to the nurse about this   He uses the [facility] on [address]

## 2019-10-17 NOTE — Progress Notes (Signed)
NEUROLOGY FOLLOW UP OFFICE NOTE  OAKLEN THIAM [id number]  HISTORY OF PRESENT ILLNESS: Rick Church is a75year old male with COPD, HTN, CAD s/p NSTEMI, arthritis and history of C3-C4 cervical disectomy and Legionnaire's pneumonia follows up for dizziness and migraines.  UPDATE: He continues to feel fairly constant dizziness and nausea.  To evaluate for a cervicogenic component to his dizziness, MRI of cervical spine was performed on 05/03/2019, which showed spondylosis including slight bilateral facet arthritis at C3-4.  In August, he fell 10 feet off of his porch and landed on a piece of plywood sustaining severe neck pain and numbness in the hands.  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  Carotid ultrasound was ordered but not performed.  He saw ENT.  Vestibular testing did not demonstrate any significant central or peripheral etiology.  He treats acute dizziness and nausea with meclizine and Zofran.  He takes gabapentin 300mg  once daily.  As advised, he is no longer on Lyrica.  HISTORY: Since January or February 2018, he has not been feeling well. He reported onset of dizziness. He has prior history of vertigo, but this was different. He described this dizziness as feeling "drunk". There was a sense of movement but not spinning. It was not positional. He also endorsed nausea and abdominal discomfort. He was found to have gallstones. After they were removed in early July, the dizziness and nausea became worse. Prior to surgery, they were episodic, where he would have 3 or 4 days of no dizziness at a time. After the surgery, it was constant. He has had falls  due to unsteadiness. He sometimes noted sunspots and double vision. He denied hearing loss but has chronic tinnitus. He denied slurred speech, facial droop or unilateral numbness or weakness. Meclizine was ineffective. Zofran did not help with nausea. He tried the Epley maneuver, which helped with his previous vertigo, but it was ineffective. CT of head from 09/04/16 was personally reviewed and revealed signs of cerebrovascular disease but no acute findings or mass lesions. He takes Lyrica once daily for neuralgia. In September 2018, he started taking it 3 times daily (as originally prescribed) and the dizziness and nausea resolved.It subsequently returned. He later was started on gabapentin 300mg  at bedtime and underwent vestibular rehab and dizziness again resolved. CTA of head was performed on 10/11/16, which was personally reviewed and revealed no significant intracranial stenosis or occlusion, including the vertebrobasilar system.   He has significant stroke risk factors. He has prior strokes. He is a smoker. He has hypertension and CAD. He takes ASA 81mg  daily.  He also has longstanding history of migrainessince the early 1960s, described as severe and pounding, beginning from back of neck and involving the front of his head. Associated nausea.They would occur daily. Stress is trigger. Fioricet helps relieve it.Past medication includes nortriptyline. Cambia was effective in the past.  Past medication:  Lyrica, nortriptyline, Cambia  PAST MEDICAL HISTORY: Past Medical History:  Diagnosis Date  . Acute respiratory distress syndrome (ARDS) (HCC)   . Anxiety   . Aortic stenosis    mild AS 01/2014 echo  . Arthritis   . Complication of anesthesia   . COPD (chronic obstructive pulmonary disease) (  HCC)   . GERD (gastroesophageal reflux disease)   . Headache   . High cholesterol   . History of hiatal hernia   . Hx of adenomatous colonic polyps   . Hypertension   .  Legionnaire's disease ([place])   . Migraine   . Neck pain   . NSTEMI (non-ST elevated myocardial infarction) ([place])    12/2013; cath non-obstructive CAD  . Panic attack   . Pneumonia 2015  . PONV (postoperative nausea and vomiting)    Vomitted after last endo precodure   . 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 Stroke ([place])    hx of x 3 - no residual  -- patient was unaware of CVA    MEDICATIONS: Current Outpatient Medications on File Prior to Visit  Medication Sig Dispense Refill  . acetaminophen (TYLENOL) 500 MG tablet Take 500 mg by mouth every 6 (six) hours as needed for moderate pain or headache.     Marland Kitchen amLODipine (NORVASC) 10 MG tablet Take 10 mg by mouth at bedtime. (2100)  1  . aspirin EC 81 MG tablet Take 81 mg by mouth at bedtime. (2100)    . butalbital-acetaminophen-caffeine (FIORICET) 50-325-40 MG tablet TAKE 1 TABLET BY MOUTH EVERY 6 HOURS AS NEEDED FOR MIGRAINE 10 tablet 4  . CAMBIA 50 MG PACK TAKE AS NEEDED FOR SEVERE MIGRAINE (Patient taking differently: Take 1 each by mouth daily as needed (migraine). ) 9 each 11  . citalopram (CELEXA) 40 MG tablet Take 40 mg by mouth at bedtime.    . clonazePAM (KLONOPIN) 2 MG tablet Take 1 tablet (2 mg total) by mouth 2 (two) times daily as needed for anxiety. (Patient taking differently: Take 2 mg by mouth at bedtime. ) 30 tablet 0  . diazepam (VALIUM) 2 MG tablet Take 2 mg by mouth every 6 (six) hours as needed for anxiety.     . gabapentin (NEURONTIN) 300 MG capsule Take 1 capsule (300 mg total) by mouth 2 (two) times daily. 60 capsule 5  . ibuprofen (ADVIL,MOTRIN) 200 MG tablet Take 400 mg by mouth every 8 (eight) hours as needed for headache or moderate pain.     Marland Kitchen irbesartan (AVAPRO) 300 MG tablet Take 300 mg by mouth at bedtime. (2100)  2  . meclizine (ANTIVERT) 25 MG tablet Take 50-150 mg by mouth See admin instructions. Take 2-3 tablets by mouth scheduled in the morning & take 5-6 tablets by mouth scheduled at night; take 2-3 tablets if needed for  additional dizziness or nausea.    . Multiple Vitamin (MULTIVITAMIN WITH MINERALS) TABS tablet Take 1 tablet by mouth at bedtime. (2100)    . pantoprazole (PROTONIX) 40 MG tablet Take 1 tablet (40 mg total) by mouth daily. (Patient taking differently: Take 40 mg by mouth daily as needed (heartburn). ) 30 tablet 0  . pregabalin (LYRICA) 75 MG capsule TAKE 1 CAPSULE BY MOUTH THREE TIMES DAILY AS NEEDED FOR PAIN 270 capsule 0  . simvastatin (ZOCOR) 80 MG tablet Take 40 mg by mouth at bedtime. (2100)    . traZODone (DESYREL) 100 MG tablet Take 100 mg by mouth at bedtime.   6   No current facility-administered medications on file prior to visit.    ALLERGIES: Allergies  Allergen Reactions  . Atorvastatin Other (See Comments)    leg myalgias  . Indocin [Indomethacin] Nausea Only and Other (See Comments)    dizzy  . Penicillins Hives and Other (See Comments)    Tolerated a dose of CEFEPIME  12/31/13 Did it involve swelling of the face/tongue/throat, SOB, or low BP? No Did it involve sudden or severe rash/hives, skin peeling, or any reaction on the inside of your mouth or nose? Yes Did you need to seek medical attention at a hospital or doctor's office? No When did it last happen?1961 If all above answers are "NO", may proceed with cephalosporin use.   Marland Kitchen Buspirone Other (See Comments)    UNSPECIFIED INTOLERANCE  . Propofol Nausea And Vomiting    Severe vomiting with anesthesia   . Restoril [Temazepam] Other (See Comments)    UNSPECIFIED INTOLERANCE  . Toprol Xl [Metoprolol Tartrate] Other (See Comments)    UNSPECIFIED REACTION    . Asa [Aspirin] Itching and Rash  . Codeine Nausea And Vomiting  . Hytrin [Terazosin] Nausea And Vomiting       . Neurontin [Gabapentin] Nausea And Vomiting       . Oxycodone Nausea Only and Other (See Comments)    "Deathly sick"  . Zestril [Lisinopril] Other (See Comments)    UNSPECIFIED SPECIFIC REACTION  Makes him feel really bad, doesn't feel like  getting up in the morning     FAMILY HISTORY: Family History  Problem Relation Age of Onset  . Hypertension Mother   . Brain cancer Mother   . Lung cancer Mother   . Hypertension Brother   . Cervical cancer Sister   . Hypertension Father   . Cerebral aneurysm Father   . Colon cancer Neg Hx   . Esophageal cancer Neg Hx   . Inflammatory bowel disease Neg Hx   . Liver disease Neg Hx   . Pancreatic cancer Neg Hx   . Rectal cancer Neg Hx   . Stomach cancer Neg Hx    SOCIAL HISTORY: Social History   Socioeconomic History  . Marital status: Widowed    Spouse name: Not on file  . Number of children: 0  . Years of education: GED  . Highest education level: GED or equivalent  Occupational History  . Occupation: retired Clinical cytogeneticist  Tobacco Use  . Smoking status: Current Every Day Smoker    Packs/day: 2.00    Types: Cigarettes    Start date: 11/14/1956  . Smokeless tobacco: Former Systems developer    Types: Chew  . Tobacco comment: cut back to 1.5 ppd  Vaping Use  . Vaping Use: Never used  Substance and Sexual Activity  . Alcohol use: No    Alcohol/week: 0.0 standard drinks  . Drug use: No  . Sexual activity: Not on file  Other Topics Concern  . Not on file  Social History Narrative   Lives at home alone.   Right-handed.   Drinks approximately 1 liter of Dr. Malachi Bonds per day.   Social Determinants of Health   Financial Resource Strain:   . Difficulty of Paying Living Expenses: Not on file  Food Insecurity:   . Worried About Charity fundraiser in the Last Year: Not on file  . Ran Out of Food in the Last Year: Not on file  Transportation Needs:   . Lack of Transportation (Medical): Not on file  . Lack of Transportation (Non-Medical): Not on file  Physical Activity:   . Days of Exercise per Week: Not on file  . Minutes of Exercise per Session: Not on file  Stress:   . Feeling of Stress : Not on file  Social Connections:   . Frequency of Communication with Friends and  Family: Not on file  .  Frequency of Social Gatherings with Friends and Family: Not on file  . Attends Religious Services: Not on file  . Active Member of Clubs or Organizations: Not on file  . Attends Archivist Meetings: Not on file  . Marital Status: Not on file  Intimate Partner Violence:   . Fear of Current or Ex-Partner: Not on file  . Emotionally Abused: Not on file  . Physically Abused: Not on file  . Sexually Abused: Not on file    PHYSICAL EXAM: Blood pressure 120/60, pulse 72, height 5\' 8"  (1.727 m), weight 173 lb (78.5 kg), SpO2 95 %. General: No acute distress.  Patient appears well-groomed.   Head:  Normocephalic/atraumatic Eyes:  Fundi examined but not visualized Neurological Exam: alert and oriented to person, place, and time. Attention span and concentration intact, recent and remote memory intact, fund of knowledge intact.  Speech fluent and not dysarthric, language intact.  CN II-XII intact. Bulk and tone normal, muscle strength 5/5 throughout.  Sensation to light touch intact.  Deep tendon reflexes 1+ throughout.  Finger to nose testing intact.  Cautious.  Romberg negative.  IMPRESSION: Chronic dizziness/vertigo.  Unclear etiology.  Migraine without aura  PLAN: 1.  Advised to increase gabapentin up to 300mg  twice daily to help with pain 2.  Meclizine and Zofran for acute treatment 3.  Follow up in 6 months.  Metta Clines, DO

## 2019-10-18 ENCOUNTER — Encounter: Payer: Self-pay | Admitting: Neurology

## 2019-10-18 ENCOUNTER — Other Ambulatory Visit: Payer: Self-pay

## 2019-10-18 ENCOUNTER — Ambulatory Visit: Payer: Medicare Other | Admitting: Neurology

## 2019-10-18 VITALS — BP 120/60 | HR 72 | Ht 68.0 in | Wt 173.0 lb

## 2019-10-18 DIAGNOSIS — R42 Dizziness and giddiness: Secondary | ICD-10-CM | POA: Diagnosis not present

## 2019-10-18 DIAGNOSIS — M542 Cervicalgia: Secondary | ICD-10-CM

## 2019-10-18 DIAGNOSIS — G43009 Migraine without aura, not intractable, without status migrainosus: Secondary | ICD-10-CM | POA: Diagnosis not present

## 2019-10-18 NOTE — Patient Instructions (Signed)
1.  You may take gabapentin 300mg  TWICE a day 2.  Use meclizine and ondansetron for dizzy spells and nausea 3.  Follow up in 6 months.

## 2019-10-31 ENCOUNTER — Other Ambulatory Visit: Payer: Self-pay | Admitting: Neurology

## 2019-11-07 ENCOUNTER — Ambulatory Visit: Payer: Medicare Other | Admitting: Neurology

## 2019-11-28 ENCOUNTER — Other Ambulatory Visit: Payer: Self-pay | Admitting: *Deleted

## 2019-11-28 DIAGNOSIS — I714 Abdominal aortic aneurysm, without rupture, unspecified: Secondary | ICD-10-CM

## 2019-12-04 ENCOUNTER — Inpatient Hospital Stay (HOSPITAL_BASED_OUTPATIENT_CLINIC_OR_DEPARTMENT_OTHER)
Admission: EM | Admit: 2019-12-04 | Discharge: 2019-12-10 | DRG: 205 | Disposition: A | Discharge status: Home Health | Payer: Medicare Other | Attending: Family Medicine | Admitting: Family Medicine

## 2019-12-04 ENCOUNTER — Emergency Department (HOSPITAL_BASED_OUTPATIENT_CLINIC_OR_DEPARTMENT_OTHER): Payer: Medicare Other

## 2019-12-04 ENCOUNTER — Other Ambulatory Visit: Payer: Self-pay

## 2019-12-04 DIAGNOSIS — E785 Hyperlipidemia, unspecified: Secondary | ICD-10-CM | POA: Diagnosis present

## 2019-12-04 DIAGNOSIS — Z8701 Personal history of pneumonia (recurrent): Secondary | ICD-10-CM

## 2019-12-04 DIAGNOSIS — Z7982 Long term (current) use of aspirin: Secondary | ICD-10-CM

## 2019-12-04 DIAGNOSIS — R001 Bradycardia, unspecified: Secondary | ICD-10-CM | POA: Diagnosis present

## 2019-12-04 DIAGNOSIS — E7800 Pure hypercholesterolemia, unspecified: Secondary | ICD-10-CM | POA: Diagnosis present

## 2019-12-04 DIAGNOSIS — I2510 Atherosclerotic heart disease of native coronary artery without angina pectoris: Secondary | ICD-10-CM | POA: Diagnosis present

## 2019-12-04 DIAGNOSIS — Z8619 Personal history of other infectious and parasitic diseases: Secondary | ICD-10-CM

## 2019-12-04 DIAGNOSIS — Z808 Family history of malignant neoplasm of other organs or systems: Secondary | ICD-10-CM

## 2019-12-04 DIAGNOSIS — K219 Gastro-esophageal reflux disease without esophagitis: Secondary | ICD-10-CM | POA: Diagnosis present

## 2019-12-04 DIAGNOSIS — J432 Centrilobular emphysema: Secondary | ICD-10-CM | POA: Diagnosis present

## 2019-12-04 DIAGNOSIS — I350 Nonrheumatic aortic (valve) stenosis: Secondary | ICD-10-CM | POA: Diagnosis present

## 2019-12-04 DIAGNOSIS — I110 Hypertensive heart disease with heart failure: Secondary | ICD-10-CM | POA: Diagnosis present

## 2019-12-04 DIAGNOSIS — Z79899 Other long term (current) drug therapy: Secondary | ICD-10-CM

## 2019-12-04 DIAGNOSIS — F32A Depression, unspecified: Secondary | ICD-10-CM | POA: Diagnosis present

## 2019-12-04 DIAGNOSIS — Z9049 Acquired absence of other specified parts of digestive tract: Secondary | ICD-10-CM

## 2019-12-04 DIAGNOSIS — J189 Pneumonia, unspecified organism: Secondary | ICD-10-CM | POA: Diagnosis present

## 2019-12-04 DIAGNOSIS — I714 Abdominal aortic aneurysm, without rupture, unspecified: Secondary | ICD-10-CM

## 2019-12-04 DIAGNOSIS — Z801 Family history of malignant neoplasm of trachea, bronchus and lung: Secondary | ICD-10-CM

## 2019-12-04 DIAGNOSIS — J9691 Respiratory failure, unspecified with hypoxia: Secondary | ICD-10-CM

## 2019-12-04 DIAGNOSIS — Z8719 Personal history of other diseases of the digestive system: Secondary | ICD-10-CM

## 2019-12-04 DIAGNOSIS — R230 Cyanosis: Secondary | ICD-10-CM | POA: Diagnosis present

## 2019-12-04 DIAGNOSIS — Z8249 Family history of ischemic heart disease and other diseases of the circulatory system: Secondary | ICD-10-CM

## 2019-12-04 DIAGNOSIS — S2232XA Fracture of one rib, left side, initial encounter for closed fracture: Principal | ICD-10-CM

## 2019-12-04 DIAGNOSIS — Z9119 Patient's noncompliance with other medical treatment and regimen: Secondary | ICD-10-CM

## 2019-12-04 DIAGNOSIS — N179 Acute kidney failure, unspecified: Secondary | ICD-10-CM | POA: Diagnosis present

## 2019-12-04 DIAGNOSIS — J9601 Acute respiratory failure with hypoxia: Secondary | ICD-10-CM

## 2019-12-04 DIAGNOSIS — Z20822 Contact with and (suspected) exposure to covid-19: Secondary | ICD-10-CM | POA: Diagnosis present

## 2019-12-04 DIAGNOSIS — F419 Anxiety disorder, unspecified: Secondary | ICD-10-CM | POA: Diagnosis present

## 2019-12-04 DIAGNOSIS — G629 Polyneuropathy, unspecified: Secondary | ICD-10-CM | POA: Diagnosis present

## 2019-12-04 DIAGNOSIS — I5031 Acute diastolic (congestive) heart failure: Secondary | ICD-10-CM | POA: Diagnosis present

## 2019-12-04 DIAGNOSIS — W1830XA Fall on same level, unspecified, initial encounter: Secondary | ICD-10-CM | POA: Diagnosis present

## 2019-12-04 DIAGNOSIS — Z880 Allergy status to penicillin: Secondary | ICD-10-CM

## 2019-12-04 DIAGNOSIS — R0902 Hypoxemia: Secondary | ICD-10-CM | POA: Diagnosis not present

## 2019-12-04 DIAGNOSIS — Z885 Allergy status to narcotic agent status: Secondary | ICD-10-CM

## 2019-12-04 DIAGNOSIS — Y92019 Unspecified place in single-family (private) house as the place of occurrence of the external cause: Secondary | ICD-10-CM

## 2019-12-04 DIAGNOSIS — Z8673 Personal history of transient ischemic attack (TIA), and cerebral infarction without residual deficits: Secondary | ICD-10-CM

## 2019-12-04 DIAGNOSIS — Z89022 Acquired absence of left finger(s): Secondary | ICD-10-CM

## 2019-12-04 DIAGNOSIS — I252 Old myocardial infarction: Secondary | ICD-10-CM

## 2019-12-04 DIAGNOSIS — F17210 Nicotine dependence, cigarettes, uncomplicated: Secondary | ICD-10-CM | POA: Diagnosis present

## 2019-12-04 DIAGNOSIS — G9341 Metabolic encephalopathy: Secondary | ICD-10-CM | POA: Diagnosis present

## 2019-12-04 DIAGNOSIS — Z8049 Family history of malignant neoplasm of other genital organs: Secondary | ICD-10-CM

## 2019-12-04 DIAGNOSIS — S298XXA Other specified injuries of thorax, initial encounter: Secondary | ICD-10-CM

## 2019-12-04 LAB — CBC WITH DIFFERENTIAL/PLATELET
Abs Immature Granulocytes: 0.11 10*3/uL — ABNORMAL HIGH (ref 0.00–0.07)
Basophils Absolute: 0.1 10*3/uL (ref 0.0–0.1)
Basophils Relative: 1 %
Eosinophils Absolute: 0 10*3/uL (ref 0.0–0.5)
Eosinophils Relative: 0 %
HCT: 44.2 % (ref 39.0–52.0)
Hemoglobin: 14.1 g/dL (ref 13.0–17.0)
Immature Granulocytes: 1 %
Lymphocytes Relative: 16 %
Lymphs Abs: 2.2 10*3/uL (ref 0.7–4.0)
MCH: 30.8 pg (ref 26.0–34.0)
MCHC: 31.9 g/dL (ref 30.0–36.0)
MCV: 96.5 fL (ref 80.0–100.0)
Monocytes Absolute: 1.2 10*3/uL — ABNORMAL HIGH (ref 0.1–1.0)
Monocytes Relative: 9 %
Neutro Abs: 10.1 10*3/uL — ABNORMAL HIGH (ref 1.7–7.7)
Neutrophils Relative %: 73 %
Platelets: 224 10*3/uL (ref 150–400)
RBC: 4.58 MIL/uL (ref 4.22–5.81)
RDW: 18.2 % — ABNORMAL HIGH (ref 11.5–15.5)
WBC: 13.7 10*3/uL — ABNORMAL HIGH (ref 4.0–10.5)
nRBC: 0.7 % — ABNORMAL HIGH (ref 0.0–0.2)

## 2019-12-04 MED ORDER — FENTANYL CITRATE (PF) 100 MCG/2ML IJ SOLN
50.0000 ug | Freq: Once | INTRAMUSCULAR | Status: AC
  Administered 2019-12-04: 50 ug via INTRAVENOUS
  Filled 2019-12-04: qty 2

## 2019-12-04 NOTE — ED Provider Notes (Signed)
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 upper quadrant abdominal tenderness, I elected to obtain CT chest abdomen pelvis CT chest reveals isolated rib fracture but no PTX.  No intra-abdominal traumatic injuries.  Patient reports this happened least 2 to 3 days ago and no signs of any head or neck trauma.  Due to new oxygen requirement, patient will be placed into the hospital. Discussed with Dr. Tonie Griffith for admission  Final Clinical Impression(s) / ED Diagnoses Final diagnoses:  Blunt trauma to chest, initial encounter  Closed fracture of one rib of left side, initial encounter  Acute respiratory failure with hypoxia (HCC)  Abdominal aortic aneurysm (AAA) without rupture [facility])    Rx / DC Orders ED Discharge Orders    None       Ripley Fraise, MD 12/05/19 0210

## 2019-12-04 NOTE — ED Triage Notes (Signed)
Pt reports falling 3 days ago, states he is unsure if hit his head but endorsing left side rib pain, takes ASA daily, A&Ox4. States he is unsure what lead to his fall. States he thinks he broke a couple ribs

## 2019-12-05 ENCOUNTER — Inpatient Hospital Stay (HOSPITAL_COMMUNITY): Payer: Medicare Other

## 2019-12-05 ENCOUNTER — Emergency Department (HOSPITAL_BASED_OUTPATIENT_CLINIC_OR_DEPARTMENT_OTHER): Payer: Medicare Other

## 2019-12-05 ENCOUNTER — Encounter (HOSPITAL_BASED_OUTPATIENT_CLINIC_OR_DEPARTMENT_OTHER): Payer: Self-pay | Admitting: Radiology

## 2019-12-05 DIAGNOSIS — R230 Cyanosis: Secondary | ICD-10-CM | POA: Diagnosis present

## 2019-12-05 DIAGNOSIS — E7800 Pure hypercholesterolemia, unspecified: Secondary | ICD-10-CM | POA: Diagnosis present

## 2019-12-05 DIAGNOSIS — E785 Hyperlipidemia, unspecified: Secondary | ICD-10-CM | POA: Diagnosis present

## 2019-12-05 DIAGNOSIS — Z20822 Contact with and (suspected) exposure to covid-19: Secondary | ICD-10-CM | POA: Diagnosis present

## 2019-12-05 DIAGNOSIS — Y92019 Unspecified place in single-family (private) house as the place of occurrence of the external cause: Secondary | ICD-10-CM | POA: Diagnosis not present

## 2019-12-05 DIAGNOSIS — S2232XA Fracture of one rib, left side, initial encounter for closed fracture: Secondary | ICD-10-CM | POA: Diagnosis present

## 2019-12-05 DIAGNOSIS — J9811 Atelectasis: Secondary | ICD-10-CM | POA: Diagnosis not present

## 2019-12-05 DIAGNOSIS — F17210 Nicotine dependence, cigarettes, uncomplicated: Secondary | ICD-10-CM | POA: Diagnosis present

## 2019-12-05 DIAGNOSIS — J449 Chronic obstructive pulmonary disease, unspecified: Secondary | ICD-10-CM | POA: Diagnosis not present

## 2019-12-05 DIAGNOSIS — F32A Depression, unspecified: Secondary | ICD-10-CM | POA: Diagnosis present

## 2019-12-05 DIAGNOSIS — I110 Hypertensive heart disease with heart failure: Secondary | ICD-10-CM | POA: Diagnosis present

## 2019-12-05 DIAGNOSIS — J432 Centrilobular emphysema: Secondary | ICD-10-CM | POA: Diagnosis present

## 2019-12-05 DIAGNOSIS — J9602 Acute respiratory failure with hypercapnia: Secondary | ICD-10-CM | POA: Diagnosis not present

## 2019-12-05 DIAGNOSIS — J9601 Acute respiratory failure with hypoxia: Secondary | ICD-10-CM

## 2019-12-05 DIAGNOSIS — Z8249 Family history of ischemic heart disease and other diseases of the circulatory system: Secondary | ICD-10-CM | POA: Diagnosis not present

## 2019-12-05 DIAGNOSIS — K219 Gastro-esophageal reflux disease without esophagitis: Secondary | ICD-10-CM | POA: Diagnosis present

## 2019-12-05 DIAGNOSIS — J189 Pneumonia, unspecified organism: Secondary | ICD-10-CM | POA: Diagnosis present

## 2019-12-05 DIAGNOSIS — W1830XA Fall on same level, unspecified, initial encounter: Secondary | ICD-10-CM | POA: Diagnosis present

## 2019-12-05 DIAGNOSIS — G629 Polyneuropathy, unspecified: Secondary | ICD-10-CM | POA: Diagnosis present

## 2019-12-05 DIAGNOSIS — I5031 Acute diastolic (congestive) heart failure: Secondary | ICD-10-CM

## 2019-12-05 DIAGNOSIS — R0902 Hypoxemia: Secondary | ICD-10-CM | POA: Diagnosis present

## 2019-12-05 DIAGNOSIS — Z9119 Patient's noncompliance with other medical treatment and regimen: Secondary | ICD-10-CM | POA: Diagnosis not present

## 2019-12-05 DIAGNOSIS — N179 Acute kidney failure, unspecified: Secondary | ICD-10-CM | POA: Diagnosis present

## 2019-12-05 DIAGNOSIS — I2510 Atherosclerotic heart disease of native coronary artery without angina pectoris: Secondary | ICD-10-CM | POA: Diagnosis present

## 2019-12-05 DIAGNOSIS — G9341 Metabolic encephalopathy: Secondary | ICD-10-CM | POA: Diagnosis present

## 2019-12-05 DIAGNOSIS — R001 Bradycardia, unspecified: Secondary | ICD-10-CM | POA: Diagnosis present

## 2019-12-05 DIAGNOSIS — I714 Abdominal aortic aneurysm, without rupture: Secondary | ICD-10-CM | POA: Diagnosis present

## 2019-12-05 DIAGNOSIS — I350 Nonrheumatic aortic (valve) stenosis: Secondary | ICD-10-CM | POA: Diagnosis present

## 2019-12-05 DIAGNOSIS — F419 Anxiety disorder, unspecified: Secondary | ICD-10-CM | POA: Diagnosis present

## 2019-12-05 DIAGNOSIS — Z8673 Personal history of transient ischemic attack (TIA), and cerebral infarction without residual deficits: Secondary | ICD-10-CM | POA: Diagnosis not present

## 2019-12-05 LAB — BLOOD GAS, ARTERIAL
Acid-Base Excess: 3 mmol/L — ABNORMAL HIGH (ref 0.0–2.0)
Bicarbonate: 27.2 mmol/L (ref 20.0–28.0)
FIO2: 55
O2 Saturation: 88.3 %
Patient temperature: 36.9
pCO2 arterial: 42.5 mmHg (ref 32.0–48.0)
pH, Arterial: 7.421 (ref 7.350–7.450)
pO2, Arterial: 56.3 mmHg — ABNORMAL LOW (ref 83.0–108.0)

## 2019-12-05 LAB — ECHOCARDIOGRAM COMPLETE BUBBLE STUDY
AR max vel: 0.9 cm2
AV Area VTI: 0.88 cm2
AV Area mean vel: 0.93 cm2
AV Mean grad: 23 mmHg
AV Peak grad: 41.3 mmHg
Ao pk vel: 3.22 m/s
Area-P 1/2: 2.24 cm2
Height: 69 in
S' Lateral: 3 cm
Single Plane A4C EF: 70.6 %
Weight: 2889.6 oz

## 2019-12-05 LAB — RESP PANEL BY RT-PCR (FLU A&B, COVID) ARPGX2
Influenza A by PCR: NEGATIVE
Influenza B by PCR: NEGATIVE
SARS Coronavirus 2 by RT PCR: NEGATIVE

## 2019-12-05 LAB — BASIC METABOLIC PANEL
Anion gap: 9 (ref 5–15)
BUN: 38 mg/dL — ABNORMAL HIGH (ref 8–23)
CO2: 25 mmol/L (ref 22–32)
Calcium: 8.7 mg/dL — ABNORMAL LOW (ref 8.9–10.3)
Chloride: 103 mmol/L (ref 98–111)
Creatinine, Ser: 1.47 mg/dL — ABNORMAL HIGH (ref 0.61–1.24)
GFR, Estimated: 49 mL/min — ABNORMAL LOW (ref >60)
Glucose, Bld: 89 mg/dL (ref 70–99)
Potassium: 4.6 mmol/L (ref 3.5–5.1)
Sodium: 137 mmol/L (ref 135–145)

## 2019-12-05 LAB — MRSA PCR SCREENING: MRSA by PCR: NEGATIVE

## 2019-12-05 LAB — BRAIN NATRIURETIC PEPTIDE: B Natriuretic Peptide: 596.8 pg/mL — ABNORMAL HIGH (ref 0.0–100.0)

## 2019-12-05 MED ORDER — ACETAMINOPHEN 325 MG PO TABS: 650.0000 mg | ORAL_TABLET | Freq: Four times a day (QID) | ORAL | Status: DC | PRN

## 2019-12-05 MED ORDER — FUROSEMIDE 10 MG/ML IJ SOLN
40.0000 mg | Freq: Once | INTRAMUSCULAR | Status: AC
  Administered 2019-12-05: 40 mg via INTRAVENOUS
  Filled 2019-12-05: qty 4

## 2019-12-05 MED ORDER — POTASSIUM CHLORIDE CRYS ER 20 MEQ PO TBCR
20.0000 meq | EXTENDED_RELEASE_TABLET | Freq: Every day | ORAL | Status: DC
  Administered 2019-12-05 – 2019-12-10 (×6): 20 meq via ORAL
  Filled 2019-12-05 (×6): qty 1

## 2019-12-05 MED ORDER — FUROSEMIDE 10 MG/ML IJ SOLN: 40.0000 mg | Freq: Once | INTRAMUSCULAR | Status: DC

## 2019-12-05 MED ORDER — SODIUM CHLORIDE 0.9 % IV SOLN
2.0000 g | INTRAVENOUS | Status: AC
  Administered 2019-12-05 – 2019-12-09 (×5): 2 g via INTRAVENOUS
  Filled 2019-12-05 (×5): qty 20

## 2019-12-05 MED ORDER — CLONAZEPAM 0.25 MG PO TBDP: 1.0000 mg | ORAL_TABLET | Freq: Two times a day (BID) | ORAL | Status: DC | PRN

## 2019-12-05 MED ORDER — IOHEXOL 300 MG/ML  SOLN
100.0000 mL | Freq: Once | INTRAMUSCULAR | Status: AC | PRN
  Administered 2019-12-05: 100 mL via INTRAVENOUS

## 2019-12-05 MED ORDER — REVEFENACIN 175 MCG/3ML IN SOLN
175.0000 ug | Freq: Every day | RESPIRATORY_TRACT | Status: DC
  Administered 2019-12-06: 175 ug via RESPIRATORY_TRACT
  Filled 2019-12-05 (×2): qty 3

## 2019-12-05 MED ORDER — FUROSEMIDE 10 MG/ML IJ SOLN
40.0000 mg | Freq: Two times a day (BID) | INTRAMUSCULAR | Status: DC
  Administered 2019-12-05: 40 mg via INTRAVENOUS
  Filled 2019-12-05: qty 4

## 2019-12-05 MED ORDER — ARFORMOTEROL TARTRATE 15 MCG/2ML IN NEBU
15.0000 ug | INHALATION_SOLUTION | Freq: Two times a day (BID) | RESPIRATORY_TRACT | Status: DC
  Administered 2019-12-05 – 2019-12-10 (×11): 15 ug via RESPIRATORY_TRACT
  Filled 2019-12-05 (×12): qty 2

## 2019-12-05 MED ORDER — TRAZODONE HCL 50 MG PO TABS
100.0000 mg | ORAL_TABLET | Freq: Every day | ORAL | Status: DC
  Administered 2019-12-05 – 2019-12-09 (×5): 100 mg via ORAL
  Filled 2019-12-05 (×5): qty 2

## 2019-12-05 MED ORDER — ONDANSETRON HCL 4 MG/2ML IJ SOLN
INTRAMUSCULAR | Status: AC
  Filled 2019-12-05: qty 2

## 2019-12-05 MED ORDER — FUROSEMIDE 10 MG/ML IJ SOLN: 40.0000 mg | Freq: Every day | INTRAMUSCULAR | Status: DC

## 2019-12-05 MED ORDER — PANTOPRAZOLE SODIUM 40 MG PO TBEC: 40.0000 mg | DELAYED_RELEASE_TABLET | Freq: Every day | ORAL | Status: DC | PRN

## 2019-12-05 MED ORDER — CITALOPRAM HYDROBROMIDE 20 MG PO TABS
40.0000 mg | ORAL_TABLET | Freq: Every day | ORAL | Status: DC
  Administered 2019-12-05 – 2019-12-09 (×5): 40 mg via ORAL
  Filled 2019-12-05 (×5): qty 2

## 2019-12-05 MED ORDER — PREGABALIN 75 MG PO CAPS: 75.0000 mg | ORAL_CAPSULE | Freq: Every day | ORAL | Status: DC

## 2019-12-05 MED ORDER — ONDANSETRON HCL 4 MG PO TABS
4.0000 mg | ORAL_TABLET | Freq: Four times a day (QID) | ORAL | Status: DC | PRN
  Administered 2019-12-07 – 2019-12-10 (×4): 4 mg via ORAL
  Filled 2019-12-05 (×4): qty 1

## 2019-12-05 MED ORDER — CLONAZEPAM 0.25 MG PO TBDP
2.0000 mg | ORAL_TABLET | Freq: Two times a day (BID) | ORAL | Status: DC | PRN
  Administered 2019-12-05 – 2019-12-08 (×2): 2 mg via ORAL
  Filled 2019-12-05 (×2): qty 8

## 2019-12-05 MED ORDER — ADULT MULTIVITAMIN W/MINERALS CH
1.0000 | ORAL_TABLET | Freq: Every day | ORAL | Status: DC
  Administered 2019-12-05 – 2019-12-09 (×5): 1 via ORAL
  Filled 2019-12-05 (×5): qty 1

## 2019-12-05 MED ORDER — GABAPENTIN 300 MG PO CAPS
300.0000 mg | ORAL_CAPSULE | Freq: Two times a day (BID) | ORAL | Status: DC
  Administered 2019-12-05 – 2019-12-10 (×11): 300 mg via ORAL
  Filled 2019-12-05 (×11): qty 1

## 2019-12-05 MED ORDER — MORPHINE SULFATE (PF) 2 MG/ML IV SOLN
2.0000 mg | INTRAVENOUS | Status: DC | PRN
  Administered 2019-12-05 (×2): 2 mg via INTRAVENOUS
  Filled 2019-12-05 (×2): qty 1

## 2019-12-05 MED ORDER — BUDESONIDE 0.25 MG/2ML IN SUSP
0.2500 mg | Freq: Two times a day (BID) | RESPIRATORY_TRACT | Status: DC
  Administered 2019-12-05 – 2019-12-08 (×7): 0.25 mg via RESPIRATORY_TRACT
  Filled 2019-12-05 (×7): qty 2

## 2019-12-05 MED ORDER — HEPARIN SODIUM (PORCINE) 5000 UNIT/ML IJ SOLN
5000.0000 [IU] | Freq: Three times a day (TID) | INTRAMUSCULAR | Status: DC
  Administered 2019-12-05 – 2019-12-10 (×17): 5000 [IU] via SUBCUTANEOUS
  Filled 2019-12-05 (×17): qty 1

## 2019-12-05 MED ORDER — ACETAMINOPHEN 650 MG RE SUPP: 650.0000 mg | Freq: Four times a day (QID) | RECTAL | Status: DC | PRN

## 2019-12-05 MED ORDER — POLYETHYLENE GLYCOL 3350 17 G PO PACK: 17.0000 g | PACK | Freq: Every day | ORAL | Status: DC | PRN

## 2019-12-05 MED ORDER — IRBESARTAN 150 MG PO TABS: 300.0000 mg | ORAL_TABLET | Freq: Every day | ORAL | Status: DC

## 2019-12-05 MED ORDER — MORPHINE SULFATE (PF) 4 MG/ML IV SOLN
4.0000 mg | INTRAVENOUS | Status: DC | PRN
  Administered 2019-12-09: 4 mg via INTRAVENOUS
  Filled 2019-12-05: qty 1

## 2019-12-05 MED ORDER — AMLODIPINE BESYLATE 10 MG PO TABS
10.0000 mg | ORAL_TABLET | Freq: Every day | ORAL | Status: DC
  Administered 2019-12-05 – 2019-12-09 (×5): 10 mg via ORAL
  Filled 2019-12-05 (×5): qty 1

## 2019-12-05 MED ORDER — CLONAZEPAM 2 MG PO TABS: 2.0000 mg | ORAL_TABLET | Freq: Two times a day (BID) | ORAL | Status: DC | PRN

## 2019-12-05 MED ORDER — HYDROMORPHONE HCL 2 MG PO TABS: 2.0000 mg | ORAL_TABLET | ORAL | Status: DC | PRN

## 2019-12-05 MED ORDER — ALBUTEROL SULFATE (2.5 MG/3ML) 0.083% IN NEBU
5.0000 mg | INHALATION_SOLUTION | Freq: Once | RESPIRATORY_TRACT | Status: AC
  Administered 2019-12-05: 5 mg via RESPIRATORY_TRACT
  Filled 2019-12-05: qty 6

## 2019-12-05 MED ORDER — FENTANYL CITRATE (PF) 100 MCG/2ML IJ SOLN
50.0000 ug | Freq: Once | INTRAMUSCULAR | Status: AC
  Administered 2019-12-05: 50 ug via INTRAVENOUS
  Filled 2019-12-05: qty 2

## 2019-12-05 MED ORDER — OXYCODONE HCL 5 MG PO TABS
5.0000 mg | ORAL_TABLET | ORAL | Status: DC | PRN
  Filled 2019-12-05: qty 1

## 2019-12-05 MED ORDER — CHLORHEXIDINE GLUCONATE CLOTH 2 % EX PADS
6.0000 | MEDICATED_PAD | Freq: Every day | CUTANEOUS | Status: DC
  Administered 2019-12-07 – 2019-12-10 (×4): 6 via TOPICAL

## 2019-12-05 MED ORDER — ONDANSETRON HCL 4 MG/2ML IJ SOLN: 4.0000 mg | Freq: Four times a day (QID) | INTRAMUSCULAR | Status: DC | PRN

## 2019-12-05 MED ORDER — ALBUTEROL SULFATE (2.5 MG/3ML) 0.083% IN NEBU: 2.5000 mg | INHALATION_SOLUTION | RESPIRATORY_TRACT | Status: DC | PRN

## 2019-12-05 MED ORDER — HYDROMORPHONE HCL 2 MG PO TABS
2.0000 mg | ORAL_TABLET | ORAL | Status: DC | PRN
  Administered 2019-12-05 – 2019-12-10 (×13): 2 mg via ORAL
  Filled 2019-12-05 (×13): qty 1

## 2019-12-05 MED ORDER — SODIUM CHLORIDE 0.9 % IV SOLN
500.0000 mg | INTRAVENOUS | Status: DC
  Administered 2019-12-05 – 2019-12-08 (×4): 500 mg via INTRAVENOUS
  Filled 2019-12-05 (×5): qty 500

## 2019-12-05 MED ORDER — ROSUVASTATIN CALCIUM 20 MG PO TABS
20.0000 mg | ORAL_TABLET | Freq: Every day | ORAL | Status: DC
  Administered 2019-12-05 – 2019-12-10 (×6): 20 mg via ORAL
  Filled 2019-12-05 (×6): qty 1

## 2019-12-05 MED ORDER — ASPIRIN EC 81 MG PO TBEC
81.0000 mg | DELAYED_RELEASE_TABLET | Freq: Every day | ORAL | Status: DC
  Administered 2019-12-05 – 2019-12-09 (×5): 81 mg via ORAL
  Filled 2019-12-05 (×5): qty 1

## 2019-12-05 MED ORDER — LEVOFLOXACIN IN D5W 750 MG/150ML IV SOLN: 750.0000 mg | INTRAVENOUS | Status: DC

## 2019-12-05 MED ORDER — MORPHINE SULFATE (PF) 4 MG/ML IV SOLN: 4.0000 mg | INTRAVENOUS | Status: DC | PRN

## 2019-12-05 NOTE — Progress Notes (Signed)
PROGRESS NOTE    RANI SISNEY  SPQ:[id number] DOB: 04/02/1940 DOA: 12/04/2019 PCP: Aura Dials, MD      Brief Narrative:  Mr. Elvin is a 79 y.o. 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  Patient confused and unable to participate in history taking.  Evidently a family member went to check on him because he had not heard from him in a few days, and found his home in disarray and to the ER.  In the ER, SPO2 initially 54% on room air, placed on Ventimask.  WBC 13 K, mild AKI, BNP 596, chest x-ray clear without pneumothorax.  Covid negative, flu negative.  CT C/A/P showed emphysema, LEFT 8th rib fracture, no pneumothorax bbut overlying subcutaneous emphysema, and AAA.         Assessment & Plan:  Acute hypoxic respiratory failure, unclear etiology CXR initially normal.  CT chest with contrast rules out large PE, and showed no significant airspae diseas, but paitent with O2 requirement way out of proportion to CT chest.  Repeat CXR this AM shows advancing infiltrates bilaterally, still no pneumothorax.  -Continue NRB  -Furosemide 40 mg IV twice a day  -K supplement -Strict I/Os, daily weights, telemetry  -Daily monitoring renal function -Obtain echo  -Start empiric antibiotics   -Consult CCM, appreciate cares   Acute metabolic encephalopathy Due to severe hypoxia  COPD History smoking -Bronchodilators as needed  Coronary and vascular disease secondary prevention Hypertension BP normal -Continue amlodipine, Crestor, aspirin -Hold ARB  Depression neuropathy -Continue citalopram -Continue gabapentin  AAA -Outpatient follow up  AKI Baseline Cr 1.1, here up to 1.47 -Hold ARB        Disposition: Status is: Inpatient  Remains inpatient  appropriate because:IV treatments appropriate due to intensity of illness or inability to take PO   Dispo: The patient is from: Home              Anticipated d/c is to: SNF              Anticipated d/c date is: > 3 days              Patient currently is not medically stable to d/c.              MDM: This is a no charge note.  For further details, please see H&P by my partner Dr. Clearence Ped from earlier today.  The below labs and imaging reports were reviewed and summarized above.    DVT prophylaxis: heparin injection 5,000 Units Start: 12/05/19 0615 SCDs Start: 12/05/19 0521  Code Status: FULL Family Communication:     Consultants:   CCM  Procedures:     Antimicrobials:   CTX and azithromycin 11/25 >>   Culture data:   None           Subjective: Patient feels dizy, confused.         Objective: Vitals:   12/05/19 0430 12/05/19 0500 12/05/19 0541 12/05/19 0756  BP: (!) 144/72 137/74 137/74   Pulse:  64  72  Resp: (!) 26 18 20 17   Temp: 98.4 F (36.9 C)   (!) 97.4 F (36.3 C)  TempSrc: Oral   Oral  SpO2: 90% 90% 93% 93%  Weight: 81.9 kg     Height:  No intake or output data in the 24 hours ending 12/05/19 0810 Filed Weights   12/04/19 2121 12/05/19 0430  Weight: 80.7 kg 81.9 kg    Examination: The patient was seen and examined.      Data Reviewed: I have personally reviewed following labs and imaging studies:  CBC: Recent Labs  Lab 12/04/19 2345  WBC 13.7*  NEUTROABS 10.1*  HGB 14.1  HCT 44.2  MCV 96.5  PLT 979   Basic Metabolic Panel: Recent Labs  Lab 12/04/19 2345  NA 137  K 4.6  CL 103  CO2 25  GLUCOSE 89  BUN 38*  CREATININE 1.47*  CALCIUM 8.7*   GFR: Estimated Creatinine Clearance: 42.1 mL/min (A) (by C-G formula based on SCr of 1.47 mg/dL (H)). Liver Function Tests: No results for input(s): AST, ALT, ALKPHOS, BILITOT, PROT, ALBUMIN in the last 168 hours. No results for input(s): LIPASE,  AMYLASE in the last 168 hours. No results for input(s): AMMONIA in the last 168 hours. Coagulation Profile: No results for input(s): INR, PROTIME in the last 168 hours. Cardiac Enzymes: No results for input(s): CKTOTAL, CKMB, CKMBINDEX, TROPONINI in the last 168 hours. BNP (last 3 results) No results for input(s): PROBNP in the last 8760 hours. HbA1C: No results for input(s): HGBA1C in the last 72 hours. CBG: No results for input(s): GLUCAP in the last 168 hours. Lipid Profile: No results for input(s): CHOL, HDL, LDLCALC, TRIG, CHOLHDL, LDLDIRECT in the last 72 hours. Thyroid Function Tests: No results for input(s): TSH, T4TOTAL, FREET4, T3FREE, THYROIDAB in the last 72 hours. Anemia Panel: No results for input(s): VITAMINB12, FOLATE, FERRITIN, TIBC, IRON, RETICCTPCT in the last 72 hours. Urine analysis:    Component Value Date/Time   COLORURINE YELLOW 02/18/2019 0831   APPEARANCEUR CLEAR 02/18/2019 0831   LABSPEC 1.011 02/18/2019 0831   PHURINE 6.0 02/18/2019 0831   GLUCOSEU NEGATIVE 02/18/2019 0831   HGBUR NEGATIVE 02/18/2019 0831   BILIRUBINUR NEGATIVE 02/18/2019 0831   KETONESUR NEGATIVE 02/18/2019 0831   PROTEINUR NEGATIVE 02/18/2019 0831   UROBILINOGEN 1.0 01/23/2014 0547   NITRITE NEGATIVE 02/18/2019 0831   LEUKOCYTESUR NEGATIVE 02/18/2019 0831   Sepsis Labs: @LABRCNTIP (procalcitonin:4,lacticacidven:4)  ) Recent Results (from the past 240 hour(s))  Resp Panel by RT-PCR (Flu A&B, Covid) Nasopharyngeal Swab     Status: None   Collection Time: 12/05/19  1:25 AM   Specimen: Nasopharyngeal Swab; Nasopharyngeal(NP) swabs in vial transport medium  Result Value Ref Range Status   SARS Coronavirus 2 by RT PCR NEGATIVE NEGATIVE Final    Comment: (NOTE) SARS-CoV-2 target nucleic acids are NOT DETECTED.  The SARS-CoV-2 RNA is generally detectable in upper respiratory specimens during the acute phase of infection. The lowest concentration of SARS-CoV-2 viral copies this  assay can detect is 138 copies/mL. A negative result does not preclude SARS-Cov-2 infection and should not be used as the sole basis for treatment or other patient management decisions. A negative result may occur with  improper specimen collection/handling, submission of specimen other than nasopharyngeal swab, presence of viral mutation(s) within the areas targeted by this assay, and inadequate number of viral copies(<138 copies/mL). A negative result must be combined with clinical observations, patient history, and epidemiological information. The expected result is Negative.  Fact Sheet for Patients:  EntrepreneurPulse.com.au  Fact Sheet for Healthcare Providers:  IncredibleEmployment.be  This test is no t yet approved or cleared by the [place] FDA and  has been authorized for detection and/or diagnosis of SARS-CoV-2 by FDA under an  Emergency Use Authorization (EUA). This EUA will remain  in effect (meaning this test can be used) for the duration of the COVID-19 declaration under Section 564(b)(1) of the Act, 21 U.S.C.section 360bbb-3(b)(1), unless the authorization is terminated  or revoked sooner.       Influenza A by PCR NEGATIVE NEGATIVE Final   Influenza B by PCR NEGATIVE NEGATIVE Final    Comment: (NOTE) The Xpert Xpress SARS-CoV-2/FLU/RSV plus assay is intended as an aid in the diagnosis of influenza from Nasopharyngeal swab specimens and should not be used as a sole basis for treatment. Nasal washings and aspirates are unacceptable for Xpert Xpress SARS-CoV-2/FLU/RSV testing.  Fact Sheet for Patients: EntrepreneurPulse.com.au  Fact Sheet for Healthcare Providers: IncredibleEmployment.be  This test is not yet approved or cleared by the [place] FDA and has been authorized for detection and/or diagnosis of SARS-CoV-2 by FDA under an Emergency Use Authorization (EUA). This EUA will  remain in effect (meaning this test can be used) for the duration of the COVID-19 declaration under Section 564(b)(1) of the Act, 21 U.S.C. section 360bbb-3(b)(1), unless the authorization is terminated or revoked.  Performed at [facility], [address] [place], [place] [address]   MRSA PCR Screening     Status: None   Collection Time: 12/05/19  4:45 AM   Specimen: Nasal Mucosa; Nasopharyngeal  Result Value Ref Range Status   MRSA by PCR NEGATIVE NEGATIVE Final    Comment:        The GeneXpert MRSA Assay (FDA approved for NASAL specimens only), is one component of a comprehensive MRSA colonization surveillance program. It is not intended to diagnose MRSA infection nor to guide or monitor treatment for MRSA infections. Performed at [facility], [place] [address], [place], [place] [address]          Radiology Studies: CT Chest W Contrast  Result Date: 12/05/2019 CLINICAL DATA:  Acute pain due to trauma. Left-sided displaced rib fractures. EXAM: CT CHEST WITH CONTRAST CT ABDOMEN AND PELVIS WITH CONTRAST TECHNIQUE: Multidetector CT imaging of the chest was performed using the standard protocol during bolus administration of intravenous contrast. Multiplanar CT image reconstructions were obtained. Multidetector CT imaging of the abdomen and pelvis was performed using the standard protocol during bolus administration of intravenous contrast. CONTRAST:  141mL OMNIPAQUE IOHEXOL 300 MG/ML  SOLN COMPARISON:  CT angio of the chest dated November 14, 2013. FINDINGS: CTA CHEST FINDINGS Cardiovascular: There are atherosclerotic changes of the thoracic aorta without evidence for dissection. The arch vessels are patent where visualized. There is no large centrally located pulmonary embolism. Detection of smaller pulmonary emboli is not possible on this study due to contrast timing. The heart is enlarged. There are coronary artery calcifications. Mediastinum/Nodes: -- No  mediastinal lymphadenopathy. -- No hilar lymphadenopathy. -- No axillary lymphadenopathy. -- No supraclavicular lymphadenopathy. -- Normal thyroid gland where visualized. -  Unremarkable esophagus. Lungs/Pleura: There are severe emphysematous changes bilaterally. There is no pneumothorax. Bibasilar atelectasis is noted. There are trace to small bilateral pleural effusions. There is subcutaneous gas along the patient's left flank. Musculoskeletal: There is an acute displaced fracture involving the eighth rib on the left. CT ABDOMEN and PELVIS FINDINGS Hepatobiliary: The liver is normal. Status post cholecystectomy.There is no biliary ductal dilation. Pancreas: Normal contours without ductal dilatation. No peripancreatic fluid collection. Spleen: Unremarkable. Adrenals/Urinary Tract: --Adrenal glands: Unremarkable. --Right kidney/ureter: No hydronephrosis or radiopaque kidney stones. --Left kidney/ureter: There is a punctate nonobstructing stone in the interpolar region  of the left kidney. --Urinary bladder: Unremarkable. Stomach/Bowel: --Stomach/Duodenum: No hiatal hernia or other gastric abnormality. Normal duodenal course and caliber. --Small bowel: Unremarkable. --Colon: Rectosigmoid diverticulosis without acute inflammation. --Appendix: Normal. Vascular/Lymphatic: Atherosclerotic changes are noted throughout the abdominal aorta. There is an infrarenal fusiform abdominal aortic aneurysm measuring approximately 5 x 4.6 cm. 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 The right common iliac artery is ectatic measuring 1.9 cm. --No retroperitoneal lymphadenopathy. --No mesenteric lymphadenopathy. --No pelvic or inguinal lymphadenopathy. Reproductive: Unremarkable Other: There is a small amount of free fluid in the patient's abdomen. The abdominal wall is normal. Musculoskeletal. There is an age-indeterminate compression fracture of the L1 vertebral body with approximately 20%  height loss anteriorly. There is no retropulsion. Degenerative changes are noted throughout the lumbar spine. IMPRESSION: 1. Acute displaced fracture involving the eighth rib on the left. No pneumothorax. 2. Subcutaneous gas along the patient's left flank. 3. Age-indeterminate compression fracture of the L1 vertebral body with approximately 20% height loss anteriorly. There is no retropulsion. 4. Infrarenal fusiform abdominal aortic aneurysm measuring approximately 5 x 4.6 cm. Recommend follow-up every 6 months and vascular consultation. This recommendation follows ACR consensus guidelines: White Paper of the ACR Incidental Findings Committee II on Vascular Findings. J Am Coll Radiol 2013; 10:789-794. 5. 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 6. Trace to small bilateral pleural effusions. 7. Cardiomegaly and coronary artery disease. 8. Punctate nonobstructing stone in the interpolar region of the left kidney. 9. Rectosigmoid diverticulosis without acute inflammation. 10. Trace ascites. Aortic Atherosclerosis (ICD10-I70.0) and Emphysema (ICD10-J43.9). Electronically Signed   By: Constance Holster M.D.   On: 12/05/2019 01:11   CT ABDOMEN PELVIS W CONTRAST  Result Date: 12/05/2019 CLINICAL DATA:  Acute pain due to trauma. Left-sided displaced rib fractures. EXAM: CT CHEST WITH CONTRAST CT ABDOMEN AND PELVIS WITH CONTRAST TECHNIQUE: Multidetector CT imaging of the chest was performed using the standard protocol during bolus administration of intravenous contrast. Multiplanar CT image reconstructions were obtained. Multidetector CT imaging of the abdomen and pelvis was performed using the standard protocol during bolus administration of intravenous contrast. CONTRAST:  187mL OMNIPAQUE IOHEXOL 300 MG/ML  SOLN COMPARISON:  CT angio of the chest dated November 14, 2013. FINDINGS: CTA CHEST FINDINGS Cardiovascular: There are atherosclerotic changes of the thoracic aorta without evidence  for dissection. The arch vessels are patent where visualized. There is no large centrally located pulmonary embolism. Detection of smaller pulmonary emboli is not possible on this study due to contrast timing. The heart is enlarged. There are coronary artery calcifications. Mediastinum/Nodes: -- No mediastinal lymphadenopathy. -- No hilar lymphadenopathy. -- No axillary lymphadenopathy. -- No supraclavicular lymphadenopathy. -- Normal thyroid gland where visualized. -  Unremarkable esophagus. Lungs/Pleura: There are severe emphysematous changes bilaterally. There is no pneumothorax. Bibasilar atelectasis is noted. There are trace to small bilateral pleural effusions. There is subcutaneous gas along the patient's left flank. Musculoskeletal: There is an acute displaced fracture involving the eighth rib on the left. CT ABDOMEN and PELVIS FINDINGS Hepatobiliary: The liver is normal. Status post cholecystectomy.There is no biliary ductal dilation. Pancreas: Normal contours without ductal dilatation. No peripancreatic fluid collection. Spleen: Unremarkable. Adrenals/Urinary Tract: --Adrenal glands: Unremarkable. --Right kidney/ureter: No hydronephrosis or radiopaque kidney stones. --Left kidney/ureter: There is a punctate nonobstructing stone in the interpolar region of the left kidney. --Urinary bladder: Unremarkable. Stomach/Bowel: --Stomach/Duodenum: No hiatal hernia or other gastric abnormality. Normal duodenal course and caliber. --Small bowel: Unremarkable. --Colon:  Rectosigmoid diverticulosis without acute inflammation. --Appendix: Normal. Vascular/Lymphatic: Atherosclerotic changes are noted throughout the abdominal aorta. There is an infrarenal fusiform abdominal aortic aneurysm measuring approximately 5 x 4.6 cm. 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 The right common iliac artery is ectatic measuring 1.9 cm. --No retroperitoneal lymphadenopathy. --No mesenteric  lymphadenopathy. --No pelvic or inguinal lymphadenopathy. Reproductive: Unremarkable Other: There is a small amount of free fluid in the patient's abdomen. The abdominal wall is normal. Musculoskeletal. There is an age-indeterminate compression fracture of the L1 vertebral body with approximately 20% height loss anteriorly. There is no retropulsion. Degenerative changes are noted throughout the lumbar spine. IMPRESSION: 1. Acute displaced fracture involving the eighth rib on the left. No pneumothorax. 2. Subcutaneous gas along the patient's left flank. 3. Age-indeterminate compression fracture of the L1 vertebral body with approximately 20% height loss anteriorly. There is no retropulsion. 4. Infrarenal fusiform abdominal aortic aneurysm measuring approximately 5 x 4.6 cm. Recommend follow-up every 6 months and vascular consultation. This recommendation follows ACR consensus guidelines: White Paper of the ACR Incidental Findings Committee II on Vascular Findings. J Am Coll Radiol 2013; 10:789-794. 5. 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 6. Trace to small bilateral pleural effusions. 7. Cardiomegaly and coronary artery disease. 8. Punctate nonobstructing stone in the interpolar region of the left kidney. 9. Rectosigmoid diverticulosis without acute inflammation. 10. Trace ascites. Aortic Atherosclerosis (ICD10-I70.0) and Emphysema (ICD10-J43.9). Electronically Signed   By: Constance Holster M.D.   On: 12/05/2019 01:11   DG CHEST PORT 1 VIEW  Result Date: 12/05/2019 CLINICAL DATA:  Shortness of breath. Respiratory failure with hypoxia EXAM: PORTABLE CHEST 1 VIEW COMPARISON:  12/05/2019 FINDINGS: Cardiac enlargement with pulmonary vascular congestion. Interstitial changes in the lungs, likely edema. Changes are progressing since previous study. Probable small left pleural effusion or basilar atelectasis. Subcutaneous emphysema in the left chest wall. Previous resection or  resorption of the distal clavicles. IMPRESSION: Cardiac enlargement with pulmonary vascular congestion and interstitial edema, progressing since previous study. Electronically Signed   By: Lucienne Capers M.D.   On: 12/05/2019 06:24   DG Chest Portable 1 View  Result Date: 12/05/2019 CLINICAL DATA:  Shortness of breath. Decreased oxygen saturation. Recent fall with rib injury. EXAM: PORTABLE CHEST 1 VIEW COMPARISON:  12/04/2019 FINDINGS: Shallow inspiration. Cardiac enlargement. No vascular congestion. Linear atelectasis in the lung bases, greater on the left. Probable small left pleural effusion. Displaced left rib fractures with subcutaneous emphysema again demonstrated. No developing pneumothorax. No change since prior study. IMPRESSION: Shallow inspiration with atelectasis in the lung bases and small left pleural effusion. Multiple left rib fractures with subcutaneous emphysema. No developing pneumothorax. Electronically Signed   By: Lucienne Capers M.D.   On: 12/05/2019 00:22   DG Chest Portable 1 View  Result Date: 12/04/2019 CLINICAL DATA:  Severe left rib pain after a fall. EXAM: PORTABLE CHEST 1 VIEW COMPARISON:  08/22/2019 FINDINGS: Shallow inspiration. Cardiac enlargement with mild pulmonary vascular congestion. Interstitial changes in the bases likely represent early edema. Linear atelectasis in both lung bases, greater on the left. Since the prior study, there are new displaced fractures of the anterolateral left eighth and ninth ribs with subcutaneous emphysema in the left chest wall. No visible pneumothorax. Calcification of the aorta. Postoperative changes in the cervical spine. IMPRESSION: 1. Cardiac enlargement with mild pulmonary vascular congestion and early interstitial edema. 2. Acute displaced fractures of the left eighth and ninth ribs with subcutaneous  emphysema in the left chest wall and atelectasis in the left base. Electronically Signed   By: Lucienne Capers M.D.   On:  12/04/2019 23:32        Scheduled Meds: . amLODipine  10 mg Oral QHS  . aspirin EC  81 mg Oral QHS  . citalopram  40 mg Oral QHS  . furosemide  40 mg Intravenous BID  . gabapentin  300 mg Oral BID  . heparin  5,000 Units Subcutaneous Q8H  . multivitamin with minerals  1 tablet Oral QHS  . potassium chloride  20 mEq Oral Daily  . rosuvastatin  20 mg Oral Daily  . traZODone  100 mg Oral QHS   Continuous Infusions:   LOS: 0 days    Time spent: 15 minutes    Edwin Dada, MD [facility] 12/05/2019, 8:10 AM     Please page though [facility] or Epic secure chat:  For password, contact charge nurse

## 2019-12-05 NOTE — H&P (Signed)
TRH H&P    Patient Demographics:    Rick Church, is a 79 y.o. male  MRN: [id number]  DOB - April 25, 1940  Admit Date - 12/04/2019  Referring MD/NP/PA: [facility]   Outpatient Primary MD for the patient is Aura Dials, MD  Patient coming from: Home  Chief complaint-fall   HPI:    Rick Church  is a 79 y.o. male, with history of CVA, NSTEMI, legionnaires disease, hypertension, colon polyps, GERD, COPD, anxiety, ARDS, and more presents to the ER with a chief complaint of fall.  Unfortunately history is quite limited.  Patient reports that the last thing he remembers is going grocery shopping on Monday or Tuesday, and then coming to 1 or 2 days later on the floor.  Patient reports that he became aware of his surroundings again at 7 PM on 12/04/2019.  He reports that when he looked around the room it looked like "Custer"s last then."  He reports it was a mess and he does not have any idea what happened there.  He thinks he fell down, he thinks he was crawling around on the floor.  At this time patient reports that he does not remember being sick.  He does know that he woke up with severe pain in his left ribs.  He called a friend who is a Marine scientist and was advised to go to the ER.  In this moment he acknowledges pain in his left ribs still, but no other complaints.  In the ER Initially patient's oxygen sat was reported at 54% on room air.  Patient arrived to this hospital on Ventimask with oxygen was in the high 80s and low 90s. Blood pressure has been stable, pulse has been borderline to bradycardic at 59 Patient had a leukocytosis at 13.7, hemoglobin 14.1 Chemistry panel reveals an AKI with a creatinine of 1.47 baseline seems to be 1.1 BNP is 596.8 Negative flu and Covid CT abdomen shows displaced eighth rib fracture on the left with no pneumothorax EKG shows sinus rhythm with a rate of 64, QTc 429 Admission requested  for management of acute hypoxic respiratory failure   Review of systems:    Review of Systems  Unable to perform ROS: Other  Patient reports he does not remember anything from before the fall    Past History of the following :    Past Medical History:  Diagnosis Date  . Acute respiratory distress syndrome (ARDS) (HCC)   . Anxiety   . Aortic stenosis    mild AS 01/2014 echo  . Arthritis   . Complication of anesthesia   . COPD (chronic obstructive pulmonary disease) ([place])   . GERD (gastroesophageal reflux disease)   . Headache   . High cholesterol   . History of hiatal hernia   . Hx of adenomatous colonic polyps   . Hypertension   . Legionnaire's disease ([place])   . Migraine   . Neck pain   . NSTEMI (non-ST elevated myocardial infarction) ([place])    12/2013; cath non-obstructive CAD  . Panic attack   .  Pneumonia 2015  . PONV (postoperative nausea and vomiting)    Vomitted after last endo precodure   . 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 Stroke ([place])    hx of x 3 - no residual  -- patient was unaware of CVA      Past Surgical History:  Procedure Laterality Date  . AMPUTATION Left 04/13/2018   Procedure: LEFT RING FINGERTIP REPAIR;  Surgeon: Milly Jakob, MD;  Location: [facility];  Service: Orthopedics;  Laterality: Left;  . BACK SURGERY  2000   fusion   . BIOPSY  12/13/2017   Procedure: BIOPSY;  Surgeon: Wonda Horner, MD;  Location: [facility];  Service: Endoscopy;;  . CARPAL TUNNEL RELEASE Bilateral   . CHOLECYSTECTOMY N/A 07/11/2016   Procedure: LAPAROSCOPIC CHOLECYSTECTOMY;  Surgeon: Georganna Skeans, MD;  Location: [facility];  Service: General;  Laterality: N/A;  . COLONOSCOPY WITH PROPOFOL N/A 12/13/2017   Procedure: COLONOSCOPY WITH PROPOFOL;  Surgeon: Wonda Horner, MD;  Location: [facility];  Service: Endoscopy;  Laterality: N/A;  . COLONOSCOPY WITH PROPOFOL N/A 06/25/2018   Procedure: COLONOSCOPY WITH PROPOFOL;  Surgeon: Rush Landmark Telford Nab., MD;  Location:  [place];  Service: Gastroenterology;  Laterality: N/A;  . ENDOSCOPIC MUCOSAL RESECTION N/A 06/25/2018   Procedure: ENDOSCOPIC MUCOSAL RESECTION;  Surgeon: Rush Landmark Telford Nab., MD;  Location: [place] [facility];  Service: Gastroenterology;  Laterality: N/A;  . FOOT NEUROMA SURGERY Left 09/29/2101  . HEMOSTASIS CLIP PLACEMENT  06/25/2018   Procedure: HEMOSTASIS CLIP PLACEMENT;  Surgeon: Irving Copas., MD;  Location: [facility];  Service: Gastroenterology;;  . LEFT HEART CATH AND CORONARY ANGIOGRAPHY N/A 06/07/2016   Procedure: Left Heart Cath and Coronary Angiography;  Surgeon: [place] Prows, MD;  Location: [facility];  Service: Cardiovascular;  Laterality: N/A;  . LEFT HEART CATHETERIZATION WITH CORONARY ANGIOGRAM N/A 01/06/2014   Procedure: LEFT HEART CATHETERIZATION WITH CORONARY ANGIOGRAM;  Surgeon: Peter M Martinique, MD;  [facility];  Service: Cardiovascular;  Laterality: N/A;  . LUMBAR FUSION    . NECK SURGERY    . POLYPECTOMY  12/13/2017   Procedure: POLYPECTOMY;  Surgeon: Wonda Horner, MD;  Location: [facility];  Service: Endoscopy;;  . POLYPECTOMY  06/25/2018   Procedure: POLYPECTOMY;  Surgeon: Irving Copas., MD;  Location: [place];  Service: Gastroenterology;;  . Cherre Robins CUFF REPAIR Left   . SHOULDER SURGERY Right 1998, 2002  . SUBMUCOSAL LIFTING INJECTION  06/25/2018   Procedure: SUBMUCOSAL LIFTING INJECTION;  Surgeon: Rush Landmark Telford Nab., MD;  Location: [place];  Service: Gastroenterology;;  . TONSILLECTOMY    . TRACHEOSTOMY     feinstein  . TRACHEOSTOMY CLOSURE        Social History:      Social History   Tobacco Use  . Smoking status: Current Every Day Smoker    Packs/day: 2.00    Types: Cigarettes    Start date: 11/14/1956  . Smokeless tobacco: Former Systems developer    Types: Chew  . Tobacco comment: cut back to 1.5 ppd  Substance Use Topics  . Alcohol use: No    Alcohol/week: 0.0 standard drinks       Family History :      Family History  Problem Relation Age of Onset  . Hypertension Mother   . Brain cancer Mother   . Lung cancer Mother   . Hypertension Brother   . Cervical cancer Sister   . Hypertension Father   . Cerebral aneurysm Father   . Colon cancer  Neg Hx   . Esophageal cancer Neg Hx   . Inflammatory bowel disease Neg Hx   . Liver disease Neg Hx   . Pancreatic cancer Neg Hx   . Rectal cancer Neg Hx   . Stomach cancer Neg Hx       Home Medications:   Prior to Admission medications   Medication Sig Start Date End Date Taking? Authorizing Provider  acetaminophen (TYLENOL) 500 MG tablet Take 500 mg by mouth every 6 (six) hours as needed for moderate pain or headache.     [provider]  amLODipine (NORVASC) 10 MG tablet Take 10 mg by mouth at bedtime. (2100) 11/12/15   [provider]  aspirin EC 81 MG tablet Take 81 mg by mouth at bedtime. (2100)    [provider]  butalbital-acetaminophen-caffeine (FIORICET) 50-325-40 MG tablet TAKE 1 TABLET BY MOUTH EVERY 6 HOURS AS NEEDED FOR MIGRAINE 08/07/19   Jaffe, Adam R, DO  CAMB[place] 50 MG PACK TAKE AS NEEDED FOR SEVERE MIGRAINE Patient taking differently: Take 1 each by mouth daily as needed (migraine).  07/28/15   Marcial Pacas, MD  citalopram (CELEXA) 40 MG tablet Take 40 mg by mouth at bedtime.    [provider]  clonazePAM (KLONOPIN) 2 MG tablet Take 1 tablet (2 mg total) by mouth 2 (two) times daily as needed for anxiety. Patient taking differently: Take 2 mg by mouth at bedtime.  06/07/16   [place] Prows, MD  diazepam (VALIUM) 2 MG tablet Take 2 mg by mouth every 6 (six) hours as needed for anxiety.  02/05/19   [provider]  gabapentin (NEURONTIN) 300 MG capsule TAKE 1 CAPSULE BY MOUTH  TWICE DAILY 10/31/19   Tomi Likens, Adam R, DO  ibuprofen (ADVIL,MOTRIN) 200 MG tablet Take 400 mg by mouth every 8 (eight) hours as needed for headache or moderate pain.     [provider]  irbesartan (AVAPRO) 300  MG tablet Take 300 mg by mouth at bedtime. (2100) 09/02/16   [provider]  meclizine (ANTIVERT) 25 MG tablet Take 50-150 mg by mouth See admin instructions. Take 2-3 tablets by mouth scheduled in the morning & take 5-6 tablets by mouth scheduled at night; take 2-3 tablets if needed for additional dizziness or nausea.    [provider]  Multiple Vitamin (MULTIVITAMIN WITH MINERALS) TABS tablet Take 1 tablet by mouth at bedtime. (2100)    [provider]  pantoprazole (PROTONIX) 40 MG tablet Take 1 tablet (40 mg total) by mouth daily. Patient taking differently: Take 40 mg by mouth daily as needed (heartburn).  01/25/14   Hongalgi, Lenis Dickinson, MD  pregabalin (LYRICA) 75 MG capsule TAKE 1 CAPSULE BY MOUTH THREE TIMES DAILY AS NEEDED FOR PAIN Patient not taking: Reported on 10/18/2019 09/13/19   Pieter Partridge, DO  simvastatin (ZOCOR) 80 MG tablet Take 40 mg by mouth at bedtime. (2100) 03/01/16   [provider]  traZODone (DESYREL) 100 MG tablet Take 100 mg by mouth at bedtime.  05/05/15   [provider]     Allergies:     Allergies  Allergen Reactions  . Atorvastatin Other (See Comments)    leg myalgias  . Indocin [Indomethacin] Nausea Only and Other (See Comments)    dizzy  . Penicillins Hives and Other (See Comments)    Tolerated a dose of CEFEPIME 12/31/13 Did it involve swelling of the face/tongue/throat, SOB, or low BP? No Did it involve sudden or severe rash/hives, skin  peeling, or any reaction on the inside of your mouth or nose? Yes Did you need to seek medical attention at a hospital or doctor's office? No When did it last happen?1961 If all above answers are "NO", may proceed with cephalosporin use.   Marland Kitchen Buspirone Other (See Comments)    UNSPECIFIED INTOLERANCE  . Propofol Nausea And Vomiting    Severe vomiting with anesthesia   . Restoril [Temazepam] Other (See Comments)    UNSPECIFIED INTOLERANCE  . Toprol Xl [Metoprolol Tartrate]  Other (See Comments)    UNSPECIFIED REACTION    . Asa [Aspirin] Itching and Rash  . Codeine Nausea And Vomiting  . Hytrin [Terazosin] Nausea And Vomiting       . Neurontin [Gabapentin] Nausea And Vomiting       . Oxycodone Nausea Only and Other (See Comments)    "Deathly sick"  . Zestril [Lisinopril] Other (See Comments)    UNSPECIFIED SPECIFIC REACTION  Makes him feel really bad, doesn't feel like getting up in the morning      Physical Exam:   Vitals  Blood pressure 137/74, pulse 64, temperature 98.4 F (36.9 C), temperature source Oral, resp. rate 20, height 5\' 9"  (1.753 m), weight 81.9 kg, SpO2 93 %.  1.  General: Patient lying supine in bed in no acute distress  2. Psychiatric: Mood and behavior are normal for situation for me however it has been reported these been quite aggressive with nursing staff Alert and oriented x3  3. Neurologic: Cranial nerves II through XII are grossly intact with no focal deficit on limited exam, speech and language are normal, moves all 4 extremities voluntarily  4. HEENMT:  Head is atraumatic, normocephalic, pupils are reactive to light, neck is supple, trachea is midline, mucous membranes are moist  5. Respiratory : Diminished breath sounds at the bilateral bases, no rhonchi wheezes or crackles  6. Cardiovascular : Heart rate is normal, rhythm is regular, no murmurs rubs or gallops  7. Gastrointestinal:  Abdomen is soft, tender in the left upper quadrant secondary to rib fracture, nondistended, bowel sounds active  8. Skin:  Skin is warm dry and intact  9.Musculoskeletal:  Tenderness over left eighth and ninth ribs, no acute deformities of extremities, no peripheral edema, no calf tenderness    Data Review:    CBC Recent Labs  Lab 12/04/19 2345  WBC 13.7*  HGB 14.1  HCT 44.2  PLT 224  MCV 96.5  MCH 30.8  MCHC 31.9  RDW 18.2*  LYMPHSABS 2.2  MONOABS 1.2*  EOSABS 0.0  BASOSABS 0.1    ------------------------------------------------------------------------------------------------------------------  Results for orders placed or performed during the hospital encounter of 12/04/19 (from the past 48 hour(s))  Basic metabolic panel     Status: Abnormal   Collection Time: 12/04/19 11:45 PM  Result Value Ref Range   Sodium 137 135 - 145 mmol/L   Potassium 4.6 3.5 - 5.1 mmol/L   Chloride 103 98 - 111 mmol/L   CO2 25 22 - 32 mmol/L   Glucose, Bld 89 70 - 99 mg/dL    Comment: Glucose reference range applies only to samples taken after fasting for at least 8 hours.   BUN 38 (H) 8 - 23 mg/dL   Creatinine, Ser 1.47 (H) 0.61 - 1.24 mg/dL   Calcium 8.7 (L) 8.9 - 10.3 mg/dL   GFR, Estimated 49 (L) >60 mL/min    Comment: (NOTE) Calculated using the CKD-EPI Creatinine Equation (2021)    Anion gap 9 5 -  15    Comment: Performed at [facility], [address] [place], [place] [address]  CBC with Differential/Platelet     Status: Abnormal   Collection Time: 12/04/19 11:45 PM  Result Value Ref Range   WBC 13.7 (H) 4.0 - 10.5 K/uL   RBC 4.58 4.22 - 5.81 MIL/uL   Hemoglobin 14.1 13.0 - 17.0 g/dL   HCT 44.2 39 - 52 %   MCV 96.5 80.0 - 100.0 fL   MCH 30.8 26.0 - 34.0 pg   MCHC 31.9 30.0 - 36.0 g/dL   RDW 18.2 (H) 11.5 - 15.5 %   Platelets 224 150 - 400 K/uL   nRBC 0.7 (H) 0.0 - 0.2 %   Neutrophils Relative % 73 %   Neutro Abs 10.1 (H) 1.7 - 7.7 K/uL   Lymphocytes Relative 16 %   Lymphs Abs 2.2 0.7 - 4.0 K/uL   Monocytes Relative 9 %   Monocytes Absolute 1.2 (H) 0.1 - 1.0 K/uL   Eosinophils Relative 0 %   Eosinophils Absolute 0.0 0.0 - 0.5 K/uL   Basophils Relative 1 %   Basophils Absolute 0.1 0.0 - 0.1 K/uL   Immature Granulocytes 1 %   Abs Immature Granulocytes 0.11 (H) 0.00 - 0.07 K/uL    Comment: Performed at [facility], [address] [place], [place] [address]  Brain natriuretic peptide     Status: Abnormal   Collection Time: 12/04/19  11:45 PM  Result Value Ref Range   B Natriuretic Peptide 596.8 (H) 0.0 - 100.0 pg/mL    Comment: Performed at [facility], [address] [place], [place] [address]  Resp Panel by RT-PCR (Flu A&B, Covid) Nasopharyngeal Swab     Status: None   Collection Time: 12/05/19  1:25 AM   Specimen: Nasopharyngeal Swab; Nasopharyngeal(NP) swabs in vial transport medium  Result Value Ref Range   SARS Coronavirus 2 by RT PCR NEGATIVE NEGATIVE    Comment: (NOTE) SARS-CoV-2 target nucleic acids are NOT DETECTED.  The SARS-CoV-2 RNA is generally detectable in upper respiratory specimens during the acute phase of infection. The lowest concentration of SARS-CoV-2 viral copies this assay can detect is 138 copies/mL. A negative result does not preclude SARS-Cov-2 infection and should not be used as the sole basis for treatment or other patient management decisions. A negative result may occur with  improper specimen collection/handling, submission of specimen other than nasopharyngeal swab, presence of viral mutation(s) within the areas targeted by this assay, and inadequate number of viral copies(<138 copies/mL). A negative result must be combined with clinical observations, patient history, and epidemiological information. The expected result is Negative.  Fact Sheet for Patients:  EntrepreneurPulse.com.au  Fact Sheet for Healthcare Providers:  IncredibleEmployment.be  This test is no t yet approved or cleared by the [place] FDA and  has been authorized for detection and/or diagnosis of SARS-CoV-2 by FDA under an Emergency Use Authorization (EUA). This EUA will remain  in effect (meaning this test can be used) for the duration of the COVID-19 declaration under Section 564(b)(1) of the Act, 21 U.S.C.section 360bbb-3(b)(1), unless the authorization is terminated  or revoked sooner.       Influenza A by PCR NEGATIVE NEGATIVE   Influenza B by  PCR NEGATIVE NEGATIVE    Comment: (NOTE) The Xpert Xpress SARS-CoV-2/FLU/RSV plus assay is intended as an aid in the diagnosis of influenza from Nasopharyngeal swab specimens and should not be used as a  sole basis for treatment. Nasal washings and aspirates are unacceptable for Xpert Xpress SARS-CoV-2/FLU/RSV testing.  Fact Sheet for Patients: EntrepreneurPulse.com.au  Fact Sheet for Healthcare Providers: IncredibleEmployment.be  This test is not yet approved or cleared by the [place] FDA and has been authorized for detection and/or diagnosis of SARS-CoV-2 by FDA under an Emergency Use Authorization (EUA). This EUA will remain in effect (meaning this test can be used) for the duration of the COVID-19 declaration under Section 564(b)(1) of the Act, 21 U.S.C. section 360bbb-3(b)(1), unless the authorization is terminated or revoked.  Performed at [facility], [address] [place], [place] [address]   Blood gas, arterial     Status: Abnormal   Collection Time: 12/05/19  5:30 AM  Result Value Ref Range   FIO2 55.00    pH, Arterial 7.421 7.35 - 7.45   pCO2 arterial 42.5 32 - 48 mmHg   pO2, Arterial 56.3 (L) 83 - 108 mmHg   Bicarbonate 27.2 20.0 - 28.0 mmol/L   Acid-Base Excess 3.0 (H) 0.0 - 2.0 mmol/L   O2 Saturation 88.3 %   Patient temperature 36.9    Collection site RIGHT RADIAL    Sample type ARTERIAL    Allens test (pass/fail) PASS PASS    Comment: Performed at [facility], [place] [address], [place], [place] [address]    Chemistries  Recent Labs  Lab 12/04/19 2345  NA 137  K 4.6  CL 103  CO2 25  GLUCOSE 89  BUN 38*  CREATININE 1.47*  CALCIUM 8.7*   ------------------------------------------------------------------------------------------------------------------  ------------------------------------------------------------------------------------------------------------------ GFR: Estimated  Creatinine Clearance: 42.1 mL/min (A) (by C-G formula based on SCr of 1.47 mg/dL (H)). Liver Function Tests: No results for input(s): AST, ALT, ALKPHOS, BILITOT, PROT, ALBUMIN in the last 168 hours. No results for input(s): LIPASE, AMYLASE in the last 168 hours. No results for input(s): AMMONIA in the last 168 hours. Coagulation Profile: No results for input(s): INR, PROTIME in the last 168 hours. Cardiac Enzymes: No results for input(s): CKTOTAL, CKMB, CKMBINDEX, TROPONINI in the last 168 hours. BNP (last 3 results) No results for input(s): PROBNP in the last 8760 hours. HbA1C: No results for input(s): HGBA1C in the last 72 hours. CBG: No results for input(s): GLUCAP in the last 168 hours. Lipid Profile: No results for input(s): CHOL, HDL, LDLCALC, TRIG, CHOLHDL, LDLDIRECT in the last 72 hours. Thyroid Function Tests: No results for input(s): TSH, T4TOTAL, FREET4, T3FREE, THYROIDAB in the last 72 hours. Anemia Panel: No results for input(s): VITAMINB12, FOLATE, FERRITIN, TIBC, IRON, RETICCTPCT in the last 72 hours.  --------------------------------------------------------------------------------------------------------------- Urine analysis:    Component Value Date/Time   COLORURINE YELLOW 02/18/2019 0831   APPEARANCEUR CLEAR 02/18/2019 0831   LABSPEC 1.011 02/18/2019 0831   PHURINE 6.0 02/18/2019 0831   GLUCOSEU NEGATIVE 02/18/2019 0831   HGBUR NEGATIVE 02/18/2019 0831   BILIRUBINUR NEGATIVE 02/18/2019 0831   KETONESUR NEGATIVE 02/18/2019 0831   PROTEINUR NEGATIVE 02/18/2019 0831   UROBILINOGEN 1.0 01/23/2014 0547   NITRITE NEGATIVE 02/18/2019 0831   LEUKOCYTESUR NEGATIVE 02/18/2019 0831      Imaging Results:    CT Chest W Contrast  Result Date: 12/05/2019 CLINICAL DATA:  Acute pain due to trauma. Left-sided displaced rib fractures. EXAM: CT CHEST WITH CONTRAST CT ABDOMEN AND PELVIS WITH CONTRAST TECHNIQUE: Multidetector CT imaging of the chest was performed using the  standard protocol during bolus administration of intravenous contrast. Multiplanar CT image reconstructions were obtained. Multidetector CT imaging of the  abdomen and pelvis was performed using the standard protocol during bolus administration of intravenous contrast. CONTRAST:  110mL OMNIPAQUE IOHEXOL 300 MG/ML  SOLN COMPARISON:  CT angio of the chest dated November 14, 2013. FINDINGS: CTA CHEST FINDINGS Cardiovascular: There are atherosclerotic changes of the thoracic aorta without evidence for dissection. The arch vessels are patent where visualized. There is no large centrally located pulmonary embolism. Detection of smaller pulmonary emboli is not possible on this study due to contrast timing. The heart is enlarged. There are coronary artery calcifications. Mediastinum/Nodes: -- No mediastinal lymphadenopathy. -- No hilar lymphadenopathy. -- No axillary lymphadenopathy. -- No supraclavicular lymphadenopathy. -- Normal thyroid gland where visualized. -  Unremarkable esophagus. Lungs/Pleura: There are severe emphysematous changes bilaterally. There is no pneumothorax. Bibasilar atelectasis is noted. There are trace to small bilateral pleural effusions. There is subcutaneous gas along the patient's left flank. Musculoskeletal: There is an acute displaced fracture involving the eighth rib on the left. CT ABDOMEN and PELVIS FINDINGS Hepatobiliary: The liver is normal. Status post cholecystectomy.There is no biliary ductal dilation. Pancreas: Normal contours without ductal dilatation. No peripancreatic fluid collection. Spleen: Unremarkable. Adrenals/Urinary Tract: --Adrenal glands: Unremarkable. --Right kidney/ureter: No hydronephrosis or radiopaque kidney stones. --Left kidney/ureter: There is a punctate nonobstructing stone in the interpolar region of the left kidney. --Urinary bladder: Unremarkable. Stomach/Bowel: --Stomach/Duodenum: No hiatal hernia or other gastric abnormality. Normal duodenal course and  caliber. --Small bowel: Unremarkable. --Colon: Rectosigmoid diverticulosis without acute inflammation. --Appendix: Normal. Vascular/Lymphatic: Atherosclerotic changes are noted throughout the abdominal aorta. There is an infrarenal fusiform abdominal aortic aneurysm measuring approximately 5 x 4.6 cm. 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 The right common iliac artery is ectatic measuring 1.9 cm. --No retroperitoneal lymphadenopathy. --No mesenteric lymphadenopathy. --No pelvic or inguinal lymphadenopathy. Reproductive: Unremarkable Other: There is a small amount of free fluid in the patient's abdomen. The abdominal wall is normal. Musculoskeletal. There is an age-indeterminate compression fracture of the L1 vertebral body with approximately 20% height loss anteriorly. There is no retropulsion. Degenerative changes are noted throughout the lumbar spine. IMPRESSION: 1. Acute displaced fracture involving the eighth rib on the left. No pneumothorax. 2. Subcutaneous gas along the patient's left flank. 3. Age-indeterminate compression fracture of the L1 vertebral body with approximately 20% height loss anteriorly. There is no retropulsion. 4. Infrarenal fusiform abdominal aortic aneurysm measuring approximately 5 x 4.6 cm. Recommend follow-up every 6 months and vascular consultation. This recommendation follows ACR consensus guidelines: White Paper of the ACR Incidental Findings Committee II on Vascular Findings. J Am Coll Radiol 2013; 10:789-794. 5. 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 6. Trace to small bilateral pleural effusions. 7. Cardiomegaly and coronary artery disease. 8. Punctate nonobstructing stone in the interpolar region of the left kidney. 9. Rectosigmoid diverticulosis without acute inflammation. 10. Trace ascites. Aortic Atherosclerosis (ICD10-I70.0) and Emphysema (ICD10-J43.9). Electronically Signed   By: Constance Holster  M.D.   On: 12/05/2019 01:11   CT ABDOMEN PELVIS W CONTRAST  Result Date: 12/05/2019 CLINICAL DATA:  Acute pain due to trauma. Left-sided displaced rib fractures. EXAM: CT CHEST WITH CONTRAST CT ABDOMEN AND PELVIS WITH CONTRAST TECHNIQUE: Multidetector CT imaging of the chest was performed using the standard protocol during bolus administration of intravenous contrast. Multiplanar CT image reconstructions were obtained. Multidetector CT imaging of the abdomen and pelvis was performed using the standard protocol during bolus administration of intravenous contrast. CONTRAST:  127mL OMNIPAQUE IOHEXOL 300 MG/ML  SOLN COMPARISON:  CT angio of the chest dated November 14, 2013. FINDINGS: CTA CHEST FINDINGS Cardiovascular: There are atherosclerotic changes of the thoracic aorta without evidence for dissection. The arch vessels are patent where visualized. There is no large centrally located pulmonary embolism. Detection of smaller pulmonary emboli is not possible on this study due to contrast timing. The heart is enlarged. There are coronary artery calcifications. Mediastinum/Nodes: -- No mediastinal lymphadenopathy. -- No hilar lymphadenopathy. -- No axillary lymphadenopathy. -- No supraclavicular lymphadenopathy. -- Normal thyroid gland where visualized. -  Unremarkable esophagus. Lungs/Pleura: There are severe emphysematous changes bilaterally. There is no pneumothorax. Bibasilar atelectasis is noted. There are trace to small bilateral pleural effusions. There is subcutaneous gas along the patient's left flank. Musculoskeletal: There is an acute displaced fracture involving the eighth rib on the left. CT ABDOMEN and PELVIS FINDINGS Hepatobiliary: The liver is normal. Status post cholecystectomy.There is no biliary ductal dilation. Pancreas: Normal contours without ductal dilatation. No peripancreatic fluid collection. Spleen: Unremarkable. Adrenals/Urinary Tract: --Adrenal glands: Unremarkable. --Right kidney/ureter:  No hydronephrosis or radiopaque kidney stones. --Left kidney/ureter: There is a punctate nonobstructing stone in the interpolar region of the left kidney. --Urinary bladder: Unremarkable. Stomach/Bowel: --Stomach/Duodenum: No hiatal hernia or other gastric abnormality. Normal duodenal course and caliber. --Small bowel: Unremarkable. --Colon: Rectosigmoid diverticulosis without acute inflammation. --Appendix: Normal. Vascular/Lymphatic: Atherosclerotic changes are noted throughout the abdominal aorta. There is an infrarenal fusiform abdominal aortic aneurysm measuring approximately 5 x 4.6 cm. 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 The right common iliac artery is ectatic measuring 1.9 cm. --No retroperitoneal lymphadenopathy. --No mesenteric lymphadenopathy. --No pelvic or inguinal lymphadenopathy. Reproductive: Unremarkable Other: There is a small amount of free fluid in the patient's abdomen. The abdominal wall is normal. Musculoskeletal. There is an age-indeterminate compression fracture of the L1 vertebral body with approximately 20% height loss anteriorly. There is no retropulsion. Degenerative changes are noted throughout the lumbar spine. IMPRESSION: 1. Acute displaced fracture involving the eighth rib on the left. No pneumothorax. 2. Subcutaneous gas along the patient's left flank. 3. Age-indeterminate compression fracture of the L1 vertebral body with approximately 20% height loss anteriorly. There is no retropulsion. 4. Infrarenal fusiform abdominal aortic aneurysm measuring approximately 5 x 4.6 cm. Recommend follow-up every 6 months and vascular consultation. This recommendation follows ACR consensus guidelines: White Paper of the ACR Incidental Findings Committee II on Vascular Findings. J Am Coll Radiol 2013; 10:789-794. 5. 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 6. Trace to small bilateral pleural effusions. 7. Cardiomegaly and  coronary artery disease. 8. Punctate nonobstructing stone in the interpolar region of the left kidney. 9. Rectosigmoid diverticulosis without acute inflammation. 10. Trace ascites. Aortic Atherosclerosis (ICD10-I70.0) and Emphysema (ICD10-J43.9). Electronically Signed   By: Constance Holster M.D.   On: 12/05/2019 01:11   DG Chest Portable 1 View  Result Date: 12/05/2019 CLINICAL DATA:  Shortness of breath. Decreased oxygen saturation. Recent fall with rib injury. EXAM: PORTABLE CHEST 1 VIEW COMPARISON:  12/04/2019 FINDINGS: Shallow inspiration. Cardiac enlargement. No vascular congestion. Linear atelectasis in the lung bases, greater on the left. Probable small left pleural effusion. Displaced left rib fractures with subcutaneous emphysema again demonstrated. No developing pneumothorax. No change since prior study. IMPRESSION: Shallow inspiration with atelectasis in the lung bases and small left pleural effusion. Multiple left rib fractures with subcutaneous emphysema. No developing pneumothorax. Electronically Signed   By: Lucienne Capers M.D.   On: 12/05/2019 00:22   DG Chest  Portable 1 View  Result Date: 12/04/2019 CLINICAL DATA:  Severe left rib pain after a fall. EXAM: PORTABLE CHEST 1 VIEW COMPARISON:  08/22/2019 FINDINGS: Shallow inspiration. Cardiac enlargement with mild pulmonary vascular congestion. Interstitial changes in the bases likely represent early edema. Linear atelectasis in both lung bases, greater on the left. Since the prior study, there are new displaced fractures of the anterolateral left eighth and ninth ribs with subcutaneous emphysema in the left chest wall. No visible pneumothorax. Calcification of the aorta. Postoperative changes in the cervical spine. IMPRESSION: 1. Cardiac enlargement with mild pulmonary vascular congestion and early interstitial edema. 2. Acute displaced fractures of the left eighth and ninth ribs with subcutaneous emphysema in the left chest wall and  atelectasis in the left base. Electronically Signed   By: Lucienne Capers M.D.   On: 12/04/2019 23:32     Assessment & Plan:    Active Problems:   Hypoxia   1. Fall with rib fractures 1. Without history is unclear what exactly happened 2. Possible syncope and collapse -echo and monitor on telemetry 3. Possibly patient was in a delirious state secondary to hypoxia with an oxygen saturation of 54% -continue supplemental oxygen 4. Consult PT 5. Pain scale for pain control 6. Continue to monitor 2. AKI 1. Creatinine at baseline is 1.1 today is 1.47 2. Questionable pulmonary edema on chest x-ray done at [facility], Lasix given 3. Clinically appears euvolemic 4. Avoid nephrotoxic agents when possible 5. Continue to monitor 3. Acute hypoxic respiratory failure 1. Etiology is unclear 2. Patient has history of COPD possibly COPD exacerbation blood gas pending 3. No infiltrate on chest x-ray 4. Possibly respiratory failure secondary to the rib fracture and not aerating lungs well secondary to pain 5. Continue supplemental oxygen to maintain oxygen saturation above 90% 6. Wean off as tolerated 7. Monitor on telemetry 4. Hypertension 1. Continue amlodipine hold ARB secondary to AKI 5. Abdominal aortic aneurysm 1. Seen on CT will need outpatient follow-up 6. Leukocytosis 1. No infectious symptoms at this time, likely stress reaction 7.    DVT Prophylaxis-   Heparin- SCDs  AM Labs Ordered, also please review Full Orders  Family Communication: No family at bedside  Code Status: Full  Admission status:Inpatient :The appropriate admission status for this patient is INPATIENT. Inpatient status is judged to be reasonable and necessary in order to provide the required intensity of service to ensure the patient's safety. The patient's presenting symptoms, physical exam findings, and initial radiographic and laboratory data in the context of their chronic comorbidities is felt to  place them at high risk for further clinical deterioration. Furthermore, it is not anticipated that the patient will be medically stable for discharge from the hospital within 2 midnights of admission. The following factors support the admission status of inpatient.     The patient's presenting symptoms include fall. The worrisome physical exam findings include tenderness over left rib cage, hypoxia down to 54%. The initial radiographic and laboratory data are worrisome because of fracture of left ribs 8, AKI The chronic co-morbidities include history of CVA, high cholesterol, hypertension, Legionnaires' disease, COPD, history of ARDS       * I certify that at the point of admission it is my clinical judgment that the patient will require inpatient hospital care spanning beyond 2 midnights from the point of admission due to high intensity of service, high risk for further deterioration and high frequency of surveillance required.*  Time spent in minutes :  [place]

## 2019-12-05 NOTE — Progress Notes (Signed)
RT called to assess patient this AM. MD at bedside. When I arrived, SAT 90% on NRB. Patient slightly SOB, BBS slight crackles in bases. CCM consulted, will hold of on BIPAP. SAT goals 88-92%. RT will continue to monitor.
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
## 2019-12-05 NOTE — ED Notes (Signed)
Placed patient on NRB due to decrease in oxygen saturations to 44%. Patient was SOB with increased RR's of 36. Patient oxygen saturations increased to 93-96%.

## 2019-12-05 NOTE — ED Notes (Signed)
After obtaining ECG applied all aspects of monitor. Pt oxygen saturation  was 55% on room air. Tried multiple probe points. Reached out to RT for evaluation. Pt  Placed on non-rebreather EDP notified.

## 2019-12-05 NOTE — Progress Notes (Signed)
Pt with compromised labored breathing on VM 55% at 15L & has a hard time standing due to pain from Left rib fracture. Pt is non-complaint, verbally & physically aggressive to staff members. Dr Gwen Pounds informed. Foley inserted as ordered. ABG done.Changed NRB by RT. Seen & examined by Dr Gwen Pounds. Will continue to monitor pt.

## 2019-12-05 NOTE — Progress Notes (Signed)
PT Cancellation Note  Patient Details Name: Rick Church MRN: [id number] DOB: 11-May-1940   Cancelled Treatment:    Reason Eval/Treat Not Completed: Patient not medically ready. Per discussion with RN pt has been hypoxic this morning and remains on NRB at this time. PT will hold until respiratory status is more stable.   Zenaida Niece 12/05/2019, 11:52 AM

## 2019-12-05 NOTE — Consult Note (Addendum)
NAME:  Rick Church, MRN:  [id number], DOB:  [birth year], LOS: 0 ADMISSION DATE:  12/04/2019, CONSULTATION DATE:  11/25 REFERRING MD:  Dr. Loleta Books, CHIEF COMPLAINT:  Rib pain, fall    Brief History   79 y/o M admitted 11/24 after mechanical fall at home 3 days prior to admit with left sided rib pain. Found to have rib fractures and acute hypoxic respiratory failure. Work up notable for negative CT with contrast of chest for large vessel embolism.    History of present illness   79 y/o M admitted 11/24 after mechanical fall at home 3 days prior to admit with left sided rib pain.   The patient reports he does not remember what led to his fall.  He states he really does not remember the last three days.  Notes that he fell and since has had left sided chest pain.  He lives alone and does not have children. He has a nephew who helps him out from time to time. The patient states he remains independent of all ADL's.  Notes he can walk but has to stop for breaks due to back pain.  States he does have a cough on most days and produces sputum. He is not on oxygen. Reports he was found several years ago on the street down and did not remember the circumstances surrounding that episode either. States he was told he had an irregular heart rhythm at that time.  Is on ASA QD.  On presentation, he was noted to be hypoxic on room air.  Given c/o chest and abdominal pain, a CT of the chest, abd, and pelvis was completed which showed an acute displaced 8th left rib fracture without pneumothorax.  There was subcutaneous gas along the left flank, compression fracture of L1 vertebral body, trace to small bilateral pleural effusions, cardiomegaly and CAD.    Found to have rib fractures and acute hypoxic respiratory failure. Work up notable for negative CT with contrast of chest for large vessel embolism.    Past Medical History  Tobacco Abuse - began smoking age 55, 1ppd Legionnaire's Disease  PNA - 2015 COPD  ARDS   HTN HLD NSTEMI - non-obs CAD on LHC 2015 CVA - no residual  Mild AS - on ECHO 2016  GERD  Hiatal Hernia  Chronic Back Pain  Significant Hospital Events   11/24 Admit   Consults:    Procedures:    Significant Diagnostic Tests:  CT Chest / ABD / Pelvis 11/25 >> showed an acute displaced 8th left rib fracture without pneumothorax.  There was subcutaneous gas along the left flank, compression fracture of L1 vertebral body, trace to small bilateral pleural effusions, cardiomegaly and CAD.    Micro Data:  COVID 11/24 >> negative  Influenza 11/24 >> negative   Antimicrobials:  Ceftriaxone 11/25 >>  Azithro 11/25 >>   Interim history/subjective:  Pt reports pain in his left side  Objective   Blood pressure 137/74, pulse 72, temperature (!) 97.4 F (36.3 C), temperature source Oral, resp. rate 17, height 5\' 9"  (1.753 m), weight 81.9 kg, SpO2 93 %.    FiO2 (%):  [55 %-100 %] 55 %  No intake or output data in the 24 hours ending 12/05/19 0805 Filed Weights   12/04/19 2121 12/05/19 0430  Weight: 80.7 kg 81.9 kg    Examination: General: elderly adult male lying in bed in NAD HEENT: MM pink/moist, anicteric  Neuro: Awake, alert, oriented, speech clear / MAE  CV:  s1s2 RRR, no m/r/g PULM: non-labored on NRB, crackles L>R base, clear anterior  GI: soft, bsx4 active  Extremities: warm/dry, RLE 1+ pitting edema, trace on left Skin: thin skin, multiple areas ecchymosis on arms, left first digit amputation   Resolved Hospital Problem list      Assessment & Plan:   Acute Hypoxic Respiratory Failure LLL Atelectasis / CAP  Displaced 8th Left Rib Fracture with SQ Air COPD without Acute Exacerbation  Multifactorial in the setting of significant underlying emphysema on CT (essentially only his lingula is spared), 2016 spirometry showed FEV1/FVC of 35% predicted, recent fall with displaced rib fracture with splinting and resultant atelectasis / possible CAP.  No pneumothorax.  Additionally, he does have an enlarged pulmonary artery on CT which given his history, likely supports undiagnosed pulmonary hypertension.  He may have baseline hypoxia given his longstanding tobacco abuse and occupational exposures. Mild elevation of BNP and small effusions bilaterally.  -pain control to allow for good pulmonary hygiene  -begin CAP coverage abx given LLL findings > note he has tolerated cephalosporins in past -mobilize / OOB  -wean O2 for sats 88-94% -assess ambulatory O2 needs prior to discharge  -will need outpatient pulmonary follow up / COPD evaluation  -lasix for net even to negative balance   ? Syncope  The patient does not remember falling at home. He does have hx of aortic stenosis noted on prior ECHO.  -consider repeat ECHO evaluation (although may not get great images due to his underlying lung disease) -defer to primary   Best practice (evaluated daily)   Diet: as tolerated  Pain/Anxiety/Delirium protocol (if indicated): n/a VAP protocol (if indicated): n/a DVT prophylaxis: per primary  GI prophylaxis: n/a  Glucose control: per primary  Mobility: as tolerated / OOB  Last date of multidisciplinary goals of care discussion: per primary  Family and staff present n/a Summary of discussion n/a Follow up goals of care discussion due: n/a  Code Status: Full Code Disposition: SDU  Labs   CBC: Recent Labs  Lab 12/04/19 2345  WBC 13.7*  NEUTROABS 10.1*  HGB 14.1  HCT 44.2  MCV 96.5  PLT 401    Basic Metabolic Panel: Recent Labs  Lab 12/04/19 2345  NA 137  K 4.6  CL 103  CO2 25  GLUCOSE 89  BUN 38*  CREATININE 1.47*  CALCIUM 8.7*   GFR: Estimated Creatinine Clearance: 42.1 mL/min (A) (by C-G formula based on SCr of 1.47 mg/dL (H)). Recent Labs  Lab 12/04/19 2345  WBC 13.7*    Liver Function Tests: No results for input(s): AST, ALT, ALKPHOS, BILITOT, PROT, ALBUMIN in the last 168 hours. No results for input(s): LIPASE, AMYLASE in the  last 168 hours. No results for input(s): AMMONIA in the last 168 hours.  ABG    Component Value Date/Time   PHART 7.421 12/05/2019 0530   PCO2ART 42.5 12/05/2019 0530   PO2ART 56.3 (L) 12/05/2019 0530   HCO3 27.2 12/05/2019 0530   TCO2 28 08/21/2019 0147   ACIDBASEDEF 1.0 11/18/2013 0851   O2SAT 88.3 12/05/2019 0530     Coagulation Profile: No results for input(s): INR, PROTIME in the last 168 hours.  Cardiac Enzymes: No results for input(s): CKTOTAL, CKMB, CKMBINDEX, TROPONINI in the last 168 hours.  HbA1C: Hgb A1c MFr Bld  Date/Time Value Ref Range Status  10/06/2015 05:16 AM 5.5 4.8 - 5.6 % Final    Comment:    (NOTE)         Pre-diabetes: 5.7 -  6.4         Diabetes: >6.4         Glycemic control for adults with diabetes: <7.0   11/25/2014 08:41 AM 6.2 (H) 4.8 - 5.6 % Final    Comment:             Pre-diabetes: 5.7 - 6.4          Diabetes: >6.4          Glycemic control for adults with diabetes: <7.0     CBG: No results for input(s): GLUCAP in the last 168 hours.  Review of Systems:   Gen: Denies fever, chills, weight change, fatigue, night sweats HEENT: Denies blurred vision, double vision, hearing loss, tinnitus, sinus congestion, rhinorrhea, sore throat, neck stiffness, dysphagia PULM: Denies shortness of breath, left sided rib pain, cough, sputum production, hemoptysis, wheezing CV: Denies chest pain, edema, orthopnea, paroxysmal nocturnal dyspnea, palpitations GI: Denies abdominal pain, nausea, vomiting, diarrhea, hematochezia, melena, constipation, change in bowel habits GU: Denies dysuria, hematuria, polyuria, oliguria, urethral discharge Endocrine: Denies hot or cold intolerance, polyuria, polyphagia or appetite change Derm: Denies rash, dry skin, scaling or peeling skin change Heme: Denies easy bruising, bleeding, bleeding gums Neuro: Denies headache, numbness, weakness, slurred speech, loss of memory or consciousness   Past Medical History  He,   has a past medical history of Acute respiratory distress syndrome (ARDS) (HCC), Anxiety, Aortic stenosis, Arthritis, Complication of anesthesia, COPD (chronic obstructive pulmonary disease) (HCC), GERD (gastroesophageal reflux disease), Headache, High cholesterol, History of hiatal hernia, adenomatous colonic polyps, Hypertension, Legionnaire's disease ([place]), Migraine, Neck pain, NSTEMI (non-ST elevated myocardial infarction) ([place]), Panic attack, Pneumonia (2015), PONV (postoperative nausea and vomiting), 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   Surgical History    Past Surgical History:  Procedure Laterality Date  . AMPUTATION Left 04/13/2018   Procedure: LEFT RING FINGERTIP REPAIR;  Surgeon: Milly Jakob, MD;  Location: [facility];  Service: Orthopedics;  Laterality: Left;  . BACK SURGERY  2000   fusion   . BIOPSY  12/13/2017   Procedure: BIOPSY;  Surgeon: Wonda Horner, MD;  Location: [facility];  Service: Endoscopy;;  . CARPAL TUNNEL RELEASE Bilateral   . CHOLECYSTECTOMY N/A 07/11/2016   Procedure: LAPAROSCOPIC CHOLECYSTECTOMY;  Surgeon: Georganna Skeans, MD;  Location: [facility];  Service: General;  Laterality: N/A;  . COLONOSCOPY WITH PROPOFOL N/A 12/13/2017   Procedure: COLONOSCOPY WITH PROPOFOL;  Surgeon: Wonda Horner, MD;  Location: [facility];  Service: Endoscopy;  Laterality: N/A;  . COLONOSCOPY WITH PROPOFOL N/A 06/25/2018   Procedure: COLONOSCOPY WITH PROPOFOL;  Surgeon: Rush Landmark Telford Nab., MD;  Location: [facility];  Service: Gastroenterology;  Laterality: N/A;  . ENDOSCOPIC MUCOSAL RESECTION N/A 06/25/2018   Procedure: ENDOSCOPIC MUCOSAL RESECTION;  Surgeon: Rush Landmark Telford Nab., MD;  Location: [facility];  Service: Gastroenterology;  Laterality: N/A;  . FOOT NEUROMA SURGERY Left 09/29/2101  . HEMOSTASIS CLIP PLACEMENT  06/25/2018   Procedure: HEMOSTASIS CLIP PLACEMENT;  Surgeon: Irving Copas., MD;  Location: [facility];  Service:  Gastroenterology;;  . LEFT HEART CATH AND CORONARY ANGIOGRAPHY N/A 06/07/2016   Procedure: Left Heart Cath and Coronary Angiography;  Surgeon: Adrian Prows, MD;  Location: [facility];  Service: Cardiovascular;  Laterality: N/A;  . LEFT HEART CATHETERIZATION WITH CORONARY ANGIOGRAM N/A 01/06/2014   Procedure: LEFT HEART CATHETERIZATION WITH CORONARY ANGIOGRAM;  Surgeon: Peter M Martinique, MD;  Location: [facility];  Service: Cardiovascular;  Laterality: N/A;  . LUMBAR FUSION    .  NECK SURGERY    . POLYPECTOMY  12/13/2017   Procedure: POLYPECTOMY;  Surgeon: Wonda Horner, MD;  Location: [facility];  Service: Endoscopy;;  . POLYPECTOMY  06/25/2018   Procedure: POLYPECTOMY;  Surgeon: Irving Copas., MD;  Location: [facility];  Service: Gastroenterology;;  . Cherre Robins CUFF REPAIR Left   . SHOULDER SURGERY Right 1998, 2002  . SUBMUCOSAL LIFTING INJECTION  06/25/2018   Procedure: SUBMUCOSAL LIFTING INJECTION;  Surgeon: Rush Landmark Telford Nab., MD;  Location: [facility];  Service: Gastroenterology;;  . TONSILLECTOMY    . TRACHEOSTOMY     feinstein  . TRACHEOSTOMY CLOSURE       Social History   reports that he has been smoking cigarettes. He started smoking about 63 years ago. He has been smoking about 2.00 packs per day. He has quit using smokeless tobacco.  His smokeless tobacco use included chew. He reports that he does not drink alcohol and does not use drugs.   Family History   His family history includes Brain cancer in his mother; Cerebral aneurysm in his father; Cervical cancer in his sister; Hypertension in his brother, father, and mother; Lung cancer in his mother. There is no history of Colon cancer, Esophageal cancer, Inflammatory bowel disease, Liver disease, Pancreatic cancer, Rectal cancer, or Stomach cancer.   Allergies Allergies  Allergen Reactions  . Atorvastatin Other (See Comments)    leg myalgias  . Indocin [Indomethacin] Nausea Only and Other (See Comments)      dizzy  . Penicillins Hives and Other (See Comments)    Tolerated a dose of CEFEPIME 12/31/13 Did it involve swelling of the face/tongue/throat, SOB, or low BP? No Did it involve sudden or severe rash/hives, skin peeling, or any reaction on the inside of your mouth or nose? Yes Did you need to seek medical attention at a hospital or doctor's office? No When did it last happen?1961 If all above answers are "NO", may proceed with cephalosporin use.   Marland Kitchen Buspirone Other (See Comments)    UNSPECIFIED INTOLERANCE  . Propofol Nausea And Vomiting    Severe vomiting with anesthesia   . Restoril [Temazepam] Other (See Comments)    UNSPECIFIED INTOLERANCE  . Toprol Xl [Metoprolol Tartrate] Other (See Comments)    UNSPECIFIED REACTION    . Asa [Aspirin] Itching and Rash  . Codeine Nausea And Vomiting  . Hytrin [Terazosin] Nausea And Vomiting       . Neurontin [Gabapentin] Nausea And Vomiting       . Oxycodone Nausea Only and Other (See Comments)    "Deathly sick"  . Zestril [Lisinopril] Other (See Comments)    UNSPECIFIED SPECIFIC REACTION  Makes him feel really bad, doesn't feel like getting up in the morning      Home Medications  Prior to Admission medications   Medication Sig Start Date End Date Taking? Authorizing Provider  acetaminophen (TYLENOL) 500 MG tablet Take 500 mg by mouth every 6 (six) hours as needed for moderate pain or headache.     [provider]  amLODipine (NORVASC) 10 MG tablet Take 10 mg by mouth at bedtime. (2100) 11/12/15   [provider]  aspirin EC 81 MG tablet Take 81 mg by mouth at bedtime. (2100)    [provider]  butalbital-acetaminophen-caffeine (FIORICET) 50-325-40 MG tablet TAKE 1 TABLET BY MOUTH EVERY 6 HOURS AS NEEDED FOR MIGRAINE 08/07/19   Jaffe, Adam R, DO  CAMBIA 50 MG PACK TAKE AS NEEDED FOR SEVERE MIGRAINE Patient taking differently: Take  1 each by mouth daily as needed (migraine).  07/28/15   Marcial Pacas, MD   citalopram (CELEXA) 40 MG tablet Take 40 mg by mouth at bedtime.    [provider]  clonazePAM (KLONOPIN) 2 MG tablet Take 1 tablet (2 mg total) by mouth 2 (two) times daily as needed for anxiety. Patient taking differently: Take 2 mg by mouth at bedtime.  06/07/16   Adrian Prows, MD  diazepam (VALIUM) 2 MG tablet Take 2 mg by mouth every 6 (six) hours as needed for anxiety.  02/05/19   [provider]  gabapentin (NEURONTIN) 300 MG capsule TAKE 1 CAPSULE BY MOUTH  TWICE DAILY 10/31/19   Tomi Likens, Adam R, DO  ibuprofen (ADVIL,MOTRIN) 200 MG tablet Take 400 mg by mouth every 8 (eight) hours as needed for headache or moderate pain.     [provider]  irbesartan (AVAPRO) 300 MG tablet Take 300 mg by mouth at bedtime. (2100) 09/02/16   [provider]  meclizine (ANTIVERT) 25 MG tablet Take 50-150 mg by mouth See admin instructions. Take 2-3 tablets by mouth scheduled in the morning & take 5-6 tablets by mouth scheduled at night; take 2-3 tablets if needed for additional dizziness or nausea.    [provider]  Multiple Vitamin (MULTIVITAMIN WITH MINERALS) TABS tablet Take 1 tablet by mouth at bedtime. (2100)    [provider]  pantoprazole (PROTONIX) 40 MG tablet Take 1 tablet (40 mg total) by mouth daily. Patient taking differently: Take 40 mg by mouth daily as needed (heartburn).  01/25/14   Hongalgi, Lenis Dickinson, MD  pregabalin (LYRICA) 75 MG capsule TAKE 1 CAPSULE BY MOUTH THREE TIMES DAILY AS NEEDED FOR PAIN Patient not taking: Reported on 10/18/2019 09/13/19   Pieter Partridge, DO  simvastatin (ZOCOR) 80 MG tablet Take 40 mg by mouth at bedtime. (2100) 03/01/16   [provider]  traZODone (DESYREL) 100 MG tablet Take 100 mg by mouth at bedtime.  05/05/15   [provider]     Critical care time: n/a     Noe Gens, MSN, NP-C, AGACNP-BC [facility] 12/05/2019, 8:05 AM   Please see [email or web address] for pager  details.    PCCM:  79 yo, s/p fall, left rib fracture, hypoxemia   PCCM consulted for hypoxemia   BP 137/74   Pulse 72   Temp (!) 97.4 F (36.3 C) (Oral)   Resp 17   Ht 5\' 9"  (1.753 m)   Wt 81.9 kg   SpO2 93%   BMI 26.67 kg/m   Gen: elderly male, on NRB, alert HENT: tracking, ncat Lungs: dimished BL, no wheeze  Heart: RRR, systolic murmur Ext: BL edema   Labs reviewed CT chest reviewed: BL upper lobe emphysema, lower lobe atelectasis, only good area of lung parenchyma is the lingula   Prior echo with mod AS   A:  Acute hypoxemic Respiratory failure - multifactorial, he is shunting, VQ mis-match, with hypoventilation due to lower lobe atelectasis on his CT chest, bad upper lobe predominate centrilobular emphysema and splinting due to rib fractures Additionally he has AS, I think his syncope history is worrisome, await repeat echo eval of AoV   P: Repeat ECHO pending I asked to the echo tech to get a bubble study to be through on the workup to r/o intracardiac shunt  I started nebs for his underlying copd  Pain control for his ribs  Mobilization Agree with cap coverage   Thanks  for the consult  Garner Nash, DO  Pulmonary Critical Care 12/05/2019 11:18 AM

## 2019-12-05 NOTE — Plan of Care (Signed)
  Problem: Education: Goal: Knowledge of General Education information will improve Description: Including pain rating scale, medication(s)/side effects and non-pharmacologic comfort measures Outcome: Progressing   Problem: Health Behavior/Discharge Planning: Goal: Ability to manage health-related needs will improve Outcome: Progressing   Problem: Clinical Measurements: Goal: Ability to maintain clinical measurements within normal limits will improve Outcome: Not Progressing Goal: Will remain free from infection Outcome: Progressing Goal: Diagnostic test results will improve Outcome: Progressing Goal: Respiratory complications will improve Outcome: Not Progressing Goal: Cardiovascular complication will be avoided Outcome: Progressing   Problem: Activity: Goal: Risk for activity intolerance will decrease Outcome: Not Progressing

## 2019-12-05 NOTE — ED Notes (Signed)
Placed patient on a 55% venturi mask. Patient oxygen saturations 92%. RR 21.

## 2019-12-06 LAB — CBC WITH DIFFERENTIAL/PLATELET
Abs Immature Granulocytes: 0.08 10*3/uL — ABNORMAL HIGH (ref 0.00–0.07)
Basophils Absolute: 0 10*3/uL (ref 0.0–0.1)
Basophils Relative: 0 %
Eosinophils Absolute: 0 10*3/uL (ref 0.0–0.5)
Eosinophils Relative: 0 %
HCT: 42.5 % (ref 39.0–52.0)
Hemoglobin: 13.5 g/dL (ref 13.0–17.0)
Immature Granulocytes: 1 %
Lymphocytes Relative: 8 %
Lymphs Abs: 1.1 10*3/uL (ref 0.7–4.0)
MCH: 30.8 pg (ref 26.0–34.0)
MCHC: 31.8 g/dL (ref 30.0–36.0)
MCV: 97 fL (ref 80.0–100.0)
Monocytes Absolute: 1 10*3/uL (ref 0.1–1.0)
Monocytes Relative: 8 %
Neutro Abs: 11 10*3/uL — ABNORMAL HIGH (ref 1.7–7.7)
Neutrophils Relative %: 83 %
Platelets: 193 10*3/uL (ref 150–400)
RBC: 4.38 MIL/uL (ref 4.22–5.81)
RDW: 17.9 % — ABNORMAL HIGH (ref 11.5–15.5)
WBC: 13.2 10*3/uL — ABNORMAL HIGH (ref 4.0–10.5)
nRBC: 0 % (ref 0.0–0.2)

## 2019-12-06 LAB — HEPATIC FUNCTION PANEL
ALT: 87 U/L — ABNORMAL HIGH (ref 0–44)
AST: 51 U/L — ABNORMAL HIGH (ref 15–41)
Albumin: 2.9 g/dL — ABNORMAL LOW (ref 3.5–5.0)
Alkaline Phosphatase: 90 U/L (ref 38–126)
Bilirubin, Direct: 0.1 mg/dL (ref 0.0–0.2)
Indirect Bilirubin: 0.7 mg/dL (ref 0.3–0.9)
Total Bilirubin: 0.8 mg/dL (ref 0.3–1.2)
Total Protein: 5.6 g/dL — ABNORMAL LOW (ref 6.5–8.1)

## 2019-12-06 LAB — BASIC METABOLIC PANEL
Anion gap: 10 (ref 5–15)
BUN: 29 mg/dL — ABNORMAL HIGH (ref 8–23)
CO2: 31 mmol/L (ref 22–32)
Calcium: 8.3 mg/dL — ABNORMAL LOW (ref 8.9–10.3)
Chloride: 100 mmol/L (ref 98–111)
Creatinine, Ser: 1.22 mg/dL (ref 0.61–1.24)
GFR, Estimated: >60 mL/min (ref >60)
Glucose, Bld: 113 mg/dL — ABNORMAL HIGH (ref 70–99)
Potassium: 3.7 mmol/L (ref 3.5–5.1)
Sodium: 141 mmol/L (ref 135–145)

## 2019-12-06 LAB — MAGNESIUM: Magnesium: 1.9 mg/dL (ref 1.7–2.4)

## 2019-12-06 MED ORDER — FUROSEMIDE 10 MG/ML IJ SOLN
40.0000 mg | Freq: Two times a day (BID) | INTRAMUSCULAR | Status: DC
  Administered 2019-12-06 – 2019-12-07 (×3): 40 mg via INTRAVENOUS
  Filled 2019-12-06 (×4): qty 4

## 2019-12-06 MED ORDER — GLUCAGON HCL RDNA (DIAGNOSTIC) 1 MG IJ SOLR
1.0000 mg | Freq: Once | INTRAMUSCULAR | Status: AC
  Administered 2019-12-06: 1 mg via INTRAVENOUS
  Filled 2019-12-06: qty 1

## 2019-12-06 MED ORDER — ACETAMINOPHEN 500 MG PO TABS
1000.0000 mg | ORAL_TABLET | Freq: Three times a day (TID) | ORAL | Status: DC
  Administered 2019-12-06 – 2019-12-10 (×12): 1000 mg via ORAL
  Filled 2019-12-06 (×12): qty 2

## 2019-12-06 MED ORDER — IPRATROPIUM-ALBUTEROL 0.5-2.5 (3) MG/3ML IN SOLN
3.0000 mL | Freq: Four times a day (QID) | RESPIRATORY_TRACT | Status: DC
Start: 2019-12-06 — End: 2019-12-06
  Administered 2019-12-06: 3 mL via RESPIRATORY_TRACT
  Filled 2019-12-06 (×2): qty 3

## 2019-12-06 MED ORDER — IPRATROPIUM-ALBUTEROL 0.5-2.5 (3) MG/3ML IN SOLN
3.0000 mL | Freq: Two times a day (BID) | RESPIRATORY_TRACT | Status: DC
  Administered 2019-12-07 – 2019-12-08 (×3): 3 mL via RESPIRATORY_TRACT
  Filled 2019-12-06 (×3): qty 3

## 2019-12-06 MED ORDER — IPRATROPIUM-ALBUTEROL 0.5-2.5 (3) MG/3ML IN SOLN: 3.0000 mL | Freq: Four times a day (QID) | RESPIRATORY_TRACT | Status: DC

## 2019-12-06 NOTE — Progress Notes (Signed)
PT Cancellation Note  Patient Details Name: Rick Church MRN: [id number] DOB: [birth year]   Cancelled Treatment:    Reason Eval/Treat Not Completed: Medical issues which prohibited therapy Checked on pt in am and he had just worked with OT.  Checked later and pt eating lunch.  He had episode of choking and per RN esophageal spasms that calmed with medication and pt ok for PT.  Spoke with pt and he was fatigued after OT and coughing for a couple hours with the spasms.  Politely declined PT today.  Will f/u as able.  Abran Richard, PT Acute Rehab Services Pager [phone] [facility] 12/06/2019, 2:58 PM

## 2019-12-06 NOTE — Consult Note (Addendum)
NAME:  Rick Church, MRN:  [id number], DOB:  05-09-1940, LOS: 1 ADMISSION DATE:  12/04/2019, CONSULTATION DATE:  11/25 REFERRING MD:  Dr. Loleta Books, CHIEF COMPLAINT:  Rib pain, fall    Brief History   79 y/o M admitted 11/24 after mechanical fall at home 3 days prior to admit with left sided rib pain. Found to have rib fractures and acute hypoxic respiratory failure. Work up notable for negative CT with contrast of chest for large vessel embolism.    History of present illness   79 y/o M admitted 11/24 after mechanical fall at home 3 days prior to admit with left sided rib pain.   The patient reports he does not remember what led to his fall.  He states he really does not remember the last three days.  Notes that he fell and since has had left sided chest pain.  He lives alone and does not have children. He has a nephew who helps him out from time to time. The patient states he remains independent of all ADL's.  Notes he can walk but has to stop for breaks due to back pain.  States he does have a cough on most days and produces sputum. He is not on oxygen. Reports he was found several years ago on the street down and did not remember the circumstances surrounding that episode either. States he was told he had an irregular heart rhythm at that time.  Is on ASA QD.  On presentation, he was noted to be hypoxic on room air.  Given c/o chest and abdominal pain, a CT of the chest, abd, and pelvis was completed which showed an acute displaced 8th left rib fracture without pneumothorax.  There was subcutaneous gas along the left flank, compression fracture of L1 vertebral body, trace to small bilateral pleural effusions, cardiomegaly and CAD.    Found to have rib fractures and acute hypoxic respiratory failure. Work up notable for negative CT with contrast of chest for large vessel embolism.    Past Medical History  Tobacco Abuse - began smoking age 11, 1ppd Legionnaire's Disease  PNA - 2015 COPD  ARDS   HTN HLD NSTEMI - non-obs CAD on LHC 2015 CVA - no residual  Mild AS - on ECHO 2016  GERD  Hiatal Hernia  Chronic Back Pain  Significant Hospital Events   11/24 Admit   Consults:  12/05/2019 pulmonary critical care  Procedures:    Significant Diagnostic Tests:  CT Chest / ABD / Pelvis 11/25 >> showed an acute displaced 8th left rib fracture without pneumothorax.  There was subcutaneous gas along the left flank, compression fracture of L1 vertebral body, trace to small bilateral pleural effusions, cardiomegaly and CAD.    Micro Data:  COVID 11/24 >> negative  Influenza 11/24 >> negative   Antimicrobials:  Ceftriaxone 11/25 >>  Azithro 11/25 >>   Interim history/subjective:  Complains of pain left side with coughing.  No acute distress currently on 2 L nasal cannula sats 88 to 90% Objective   Blood pressure 133/69, pulse (!) 59, temperature 98.5 F (36.9 C), temperature source Oral, resp. rate 16, height 5\' 9"  (1.753 m), weight 81.8 kg, SpO2 92 %.    FiO2 (%):  [90 %-100 %] 90 %   Intake/Output Summary (Last 24 hours) at 12/06/2019 0929 Last data filed at 12/05/2019 1500 Gross per 24 hour  Intake 2274 ml  Output 750 ml  Net 1524 ml   Filed Weights   12/04/19  2121 12/05/19 0430 12/06/19 0402  Weight: 80.7 kg 81.9 kg 81.8 kg    Examination: General: 79 year old male who appears to be neurologically intact at this time. HEENT: No JVD or lymphadenopathy is appreciated Neuro: No obvious focal defects CV: Heart sounds are regular PULM: Decreased breath sounds throughout currently on 10 L nasal cannula sats 88% sats do increase when he does cough and deep breathing which he does not want to do because of pain  GI: soft, bsx4 active  GU: Voids Extremities: warm/dry,  edema  Skin: no rashes or lesions    Resolved Hospital Problem list      Assessment & Plan:   Acute Hypoxic Respiratory Failure LLL Atelectasis / CAP  Displaced 8th Left Rib Fracture with SQ  Air COPD without Acute Exacerbation  Multifactorial in the setting of significant underlying emphysema on CT (essentially only his lingula is spared), 2016 spirometry showed FEV1/FVC of 35% predicted, recent fall with displaced rib fracture with splinting and resultant atelectasis / possible CAP.  No pneumothorax. Additionally, he does have an enlarged pulmonary artery on CT which given his history, likely supports undiagnosed pulmonary hypertension.  He may have baseline hypoxia given his longstanding tobacco abuse and occupational exposures. Mild elevation of BNP and small effusions bilaterally.   Wean FiO2 as tolerated Pulmonary toilet as tolerated Pain control with care not to decrease respiratory drive Agree with community-acquired pneumonia coverage I suspect he was O2 dependent prior to coming into the hospital.  And will require oxygen on discharge most likely. Ambulatory walking test prior to discharge to assess O2 requirement Pulmonary critical care will be available as needed.  ? Syncope  The patient does not remember falling at home. He does have hx of aortic stenosis noted on prior ECHO.  Questionable repeat echo evaluation Best practice (evaluated daily)   Diet: as tolerated  Pain/Anxiety/Delirium protocol (if indicated): n/a VAP protocol (if indicated): n/a DVT prophylaxis: per primary  GI prophylaxis: n/a  Glucose control: per primary  Mobility: as tolerated / OOB  Last date of multidisciplinary goals of care discussion: per primary  Family and staff present n/a Summary of discussion n/a Follow up goals of care discussion due: n/a  Code Status: Full Code Disposition: SDU  Labs   CBC: Recent Labs  Lab 12/04/19 2345 12/06/19 0017  WBC 13.7* 13.2*  NEUTROABS 10.1* 11.0*  HGB 14.1 13.5  HCT 44.2 42.5  MCV 96.5 97.0  PLT 224 161    Basic Metabolic Panel: Recent Labs  Lab 12/04/19 2345 12/06/19 0017  NA 137 141  K 4.6 3.7  CL 103 100  CO2 25 31  GLUCOSE  89 113*  BUN 38* 29*  CREATININE 1.47* 1.22  CALCIUM 8.7* 8.3*  MG  --  1.9   GFR: Estimated Creatinine Clearance: 50.7 mL/min (by C-G formula based on SCr of 1.22 mg/dL). Recent Labs  Lab 12/04/19 2345 12/06/19 0017  WBC 13.7* 13.2*    Liver Function Tests: Recent Labs  Lab 12/06/19 0017  AST 51*  ALT 87*  ALKPHOS 90  BILITOT 0.8  PROT 5.6*  ALBUMIN 2.9*   No results for input(s): LIPASE, AMYLASE in the last 168 hours. No results for input(s): AMMONIA in the last 168 hours.  ABG    Component Value Date/Time   PHART 7.421 12/05/2019 0530   PCO2ART 42.5 12/05/2019 0530   PO2ART 56.3 (L) 12/05/2019 0530   HCO3 27.2 12/05/2019 0530   TCO2 28 08/21/2019 0147   ACIDBASEDEF 1.0 11/18/2013  5945   O2SAT 88.3 12/05/2019 0530     Coagulation Profile: No results for input(s): INR, PROTIME in the last 168 hours.  Cardiac Enzymes: No results for input(s): CKTOTAL, CKMB, CKMBINDEX, TROPONINI in the last 168 hours.  HbA1C: Hgb A1c MFr Bld  Date/Time Value Ref Range Status  10/06/2015 05:16 AM 5.5 4.8 - 5.6 % Final    Comment:    (NOTE)         Pre-diabetes: 5.7 - 6.4         Diabetes: >6.4         Glycemic control for adults with diabetes: <7.0   11/25/2014 08:41 AM 6.2 (H) 4.8 - 5.6 % Final    Comment:             Pre-diabetes: 5.7 - 6.4          Diabetes: >6.4          Glycemic control for adults with diabetes: <7.0     CBG: No results for input(s): GLUCAP in the last 168 hours.       Richardson Landry Minor ACNP Acute Care Nurse Practitioner [facility] Please consult Amion 12/06/2019, 9:29 AM   PCCM:  79 yo, emphysema, fall, rib fracture, hypoxemia, lower lobe atelectasis. Getting better. Weaning fio2. Still has shallow breaths. Moderate AS  ECHO with no obvious shunt, moderate AS, bi atrial enlargement, grade 1 DF   BP 133/69 (BP Location: Left Arm)   Pulse (!) 59   Temp 98.5 F (36.9 C) (Oral)   Resp 16   Ht 5\' 9"  (1.753 m)    Wt 81.8 kg   SpO2 92%   BMI 26.63 kg/m   Gen: elderly male, resting in bed, no distress  HENT: tracking  Heart: Systolic murmur  Lungs: CTAB, no wheeze, poor effort, splinting with deep respirations   A:  Acute hypoxemic resp failure - this is from various problems, hypoventilation from rib fracture, splinting, basilar atelectasis causing vq mismatch, severe emphysema predominately in his upper lobes Moderate AS   P: Mobilization is key  OOB and UIC as much as tolerated  Pain control  Walking with PT if possible  Scheduled nebs for copd  Diuresis prn to maintain euvolemia  Pulmonary will be available this weekend if needed. Please call us.   Thanks  Garner Nash, DO [facility] 12/06/2019 10:49 AM

## 2019-12-06 NOTE — Plan of Care (Signed)
  Problem: Health Behavior/Discharge Planning: Goal: Ability to manage health-related needs will improve Outcome: Progressing   Problem: Clinical Measurements: Goal: Ability to maintain clinical measurements within normal limits will improve Outcome: Progressing   Problem: Clinical Measurements: Goal: Will remain free from infection Outcome: Progressing   Problem: Clinical Measurements: Goal: Respiratory complications will improve Outcome: Progressing   Problem: Activity: Goal: Risk for activity intolerance will decrease Outcome: Progressing   

## 2019-12-06 NOTE — Progress Notes (Signed)
PROGRESS NOTE    Rick Church  UVO:[id number] DOB: 1940/07/14 DOA: 12/04/2019 PCP: Aura Dials, MD      Brief Narrative:  [name] is a 79 y.o. 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  Patient confused and unable to participate in history taking.  Evidently a family member went to check on him because he had not heard from him in a few days, and found his home in disarray and to the ER.  In the ER, SPO2 initially 54% on room air, placed on Ventimask.  WBC 13 K, mild AKI, BNP 596, chest x-ray clear without pneumothorax.  Covid negative, flu negative.  CT C/A/P showed emphysema, LEFT 8th rib fracture, no pneumothorax bbut overlying subcutaneous emphysema, and AAA.         Assessment & Plan:  Acute respiratory failure with hypoxia Multifactorial, appears to be some degree of atelectasis, fluid, or infection in the lower lobes bilaterally causing shunting, superimposed severe centrilobular emphysema, worsened by his rib fractures.  CXR initially normal.  CT chest with contrast ruled out large PE, and showed no significant airspace disease, but did show severe emphysema, chronic, and subsequently patient's O2 requirements increased dramatically  Repeat chest x-ray showed advancing bilateral infiltrates, still no pneumothorax.  Patient started on antibiotics, diuretics, bronchodilators, pain control, and pulmonology were consulted.  Echocardiogram shows grade 1 diastolic dysfunction, mild to moderate AS  We have been able to wean down his oxygen, with about net -2 L yesterday (I's and O's are incorrect).  -Continue IV Lasix -Continue strict ins and outs -Continue antibiotics -Continue aggressive bronchodilators -Continue inhaled steroid -Mobilization and pain  control -Consult CCM, appreciate cares   Acute metabolic encephalopathy Due to severe hypoxia, improving  Rib fracture -Continue home Dilaudid -Schedule Tylenol -Aggressive pulmonary toilet  COPD History smoking -Continue inhaled steroid, LABA, LAMA -Frequent bronchodilators  Coronary and vascular disease secondary prevention Hypertension BP normal -Continue amlodipine, Crestor, aspirin -Continue to hold ARB  Depression neuropathy -Continue citalopram -Continue gabapentin  AAA -Outpatient follow up  AKI Baseline Cr 1.1, here up to 1.47, improving today with diuresis, 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    The patient was admitted with severe acute hypoxic respiratory failure, he is starting to improve, but is still requiring 6 L of oxygen, and is dyspneic with minimal exertion.          MDM: The below labs and imaging reports reviewed and summarized above.  Medication management as above.     DVT prophylaxis: heparin injection 5,000 Units Start: 12/05/19 0615 SCDs Start: 12/05/19 0521  Code Status: FULL Family Communication: Son by phone    Consultants:   CCM  Procedures:   11/25 echocardiogram-EF normal, grade 1 diastolic dysfunction, moderate AS  Antimicrobials:  CTX and azithromycin 11/25 >>   Culture data:   None           Subjective: Patient is feeling better, he still has severe left-sided pain, no confusion, mild psychomotor slowing, some dependent dyspnea is noted.  No vomiting, no swelling.        Objective: Vitals:   12/05/19 1900 12/05/19 2117  12/06/19 0000 12/06/19 0402  BP: 125/67 139/61 (!) 99/57 133/69  Pulse: 61 61 (!) 51 (!) 59  Resp: 17 20 16 16   Temp: 98.5 F (36.9 C)  (!) 97.3 F (36.3 C) 98.5 F (36.9 C)  TempSrc: Oral  Axillary Oral  SpO2: 95% 91% (!) 89% 92%  Weight:    81.8 kg  Height:        Intake/Output Summary (Last 24 hours) at 12/06/2019 3220 Last data filed at 12/05/2019 1500 Gross per 24 hour  Intake 2274 ml  Output 750 ml  Net 1524 ml   Filed Weights   12/04/19 2121 12/05/19 0430 12/06/19 0402  Weight: 80.7 kg 81.9 kg 81.8 kg    Examination: General appearance: Elderly adult male, lying in bed, interactive, tired.     HEENT: Pupils small but equal and reactive, oropharynx moist, no oral lesions, dentition normal repair, hearing normal Skin: Skin warm and dry, no suspicious rashes or lesions, no crepitus is felt over the left ribs Cardiac: Slow, regular, sinus rhythm on monitor, systolic murmur noted, no lower extremity edema, JVP not elevated Respiratory: Crackles at bilateral bases, up to mid lung.  Diminished throughout, no wheezing. Abdomen: Abdomen soft without tenderness palpation or guarding, no ascites MSK: Normal muscle bulk and tone Neuro: Extraocular movements intact, moves all extremities with generalized weakness but normal coordination, speech fluent Psych: Attention somewhat diminished, affect pleasant, judgment insight appear mildly impaired.      Data Reviewed: I have personally reviewed following labs and imaging studies:  CBC: Recent Labs  Lab 12/04/19 2345 12/06/19 0017  WBC 13.7* 13.2*  NEUTROABS 10.1* 11.0*  HGB 14.1 13.5  HCT 44.2 42.5  MCV 96.5 97.0  PLT 224 254   Basic Metabolic Panel: Recent Labs  Lab 12/04/19 2345 12/06/19 0017  NA 137 141  K 4.6 3.7  CL 103 100  CO2 25 31  GLUCOSE 89 113*  BUN 38* 29*  CREATININE 1.47* 1.22  CALCIUM 8.7* 8.3*  MG  --  1.9   GFR: Estimated Creatinine Clearance: 50.7 mL/min (by C-G formula based on SCr of  1.22 mg/dL). Liver Function Tests: Recent Labs  Lab 12/06/19 0017  AST 51*  ALT 87*  ALKPHOS 90  BILITOT 0.8  PROT 5.6*  ALBUMIN 2.9*   No results for input(s): LIPASE, AMYLASE in the last 168 hours. No results for input(s): AMMONIA in the last 168 hours. Coagulation Profile: No results for input(s): INR, PROTIME in the last 168 hours. Cardiac Enzymes: No results for input(s): CKTOTAL, CKMB, CKMBINDEX, TROPONINI in the last 168 hours. BNP (last 3 results) No results for input(s): PROBNP in the last 8760 hours. HbA1C: No results for input(s): HGBA1C in the last 72 hours. CBG: No results for input(s): GLUCAP in the last 168 hours. Lipid Profile: No results for input(s): CHOL, HDL, LDLCALC, TRIG, CHOLHDL, LDLDIRECT in the last 72 hours. Thyroid Function Tests: No results for input(s): TSH, T4TOTAL, FREET4, T3FREE, THYROIDAB in the last 72 hours. Anemia Panel: No results for input(s): VITAMINB12, FOLATE, FERRITIN, TIBC, IRON, RETICCTPCT in the last 72 hours. Urine analysis:    Component  Value Date/Time   COLORURINE YELLOW 02/18/2019 0831   APPEARANCEUR CLEAR 02/18/2019 0831   LABSPEC 1.011 02/18/2019 0831   PHURINE 6.0 02/18/2019 0831   GLUCOSEU NEGATIVE 02/18/2019 0831   HGBUR NEGATIVE 02/18/2019 0831   BILIRUBINUR NEGATIVE 02/18/2019 0831   KETONESUR NEGATIVE 02/18/2019 0831   PROTEINUR NEGATIVE 02/18/2019 0831   UROBILINOGEN 1.0 01/23/2014 0547   NITRITE NEGATIVE 02/18/2019 0831   LEUKOCYTESUR NEGATIVE 02/18/2019 0831   Sepsis Labs: @LABRCNTIP (procalcitonin:4,lacticacidven:4)  ) Recent Results (from the past 240 hour(s))  Resp Panel by RT-PCR (Flu A&B, Covid) Nasopharyngeal Swab     Status: None   Collection Time: 12/05/19  1:25 AM   Specimen: Nasopharyngeal Swab; Nasopharyngeal(NP) swabs in vial transport medium  Result Value Ref Range Status   SARS Coronavirus 2 by RT PCR NEGATIVE NEGATIVE Final    Comment: (NOTE) SARS-CoV-2 target nucleic acids are NOT  DETECTED.  The SARS-CoV-2 RNA is generally detectable in upper respiratory specimens during the acute phase of infection. The lowest concentration of SARS-CoV-2 viral copies this assay can detect is 138 copies/mL. A negative result does not preclude SARS-Cov-2 infection and should not be used as the sole basis for treatment or other patient management decisions. A negative result may occur with  improper specimen collection/handling, submission of specimen other than nasopharyngeal swab, presence of viral mutation(s) within the areas targeted by this assay, and inadequate number of viral copies(<138 copies/mL). A negative result must be combined with clinical observations, patient history, and epidemiological information. The expected result is Negative.  Fact Sheet for Patients:  EntrepreneurPulse.com.au  Fact Sheet for Healthcare Providers:  IncredibleEmployment.be  This test is no t yet approved or cleared by the [place] FDA and  has been authorized for detection and/or diagnosis of SARS-CoV-2 by FDA under an Emergency Use Authorization (EUA). This EUA will remain  in effect (meaning this test can be used) for the duration of the COVID-19 declaration under Section 564(b)(1) of the Act, 21 U.S.C.section 360bbb-3(b)(1), unless the authorization is terminated  or revoked sooner.       Influenza A by PCR NEGATIVE NEGATIVE Final   Influenza B by PCR NEGATIVE NEGATIVE Final    Comment: (NOTE) The Xpert Xpress SARS-CoV-2/FLU/RSV plus assay is intended as an aid in the diagnosis of influenza from Nasopharyngeal swab specimens and should not be used as a sole basis for treatment. Nasal washings and aspirates are unacceptable for Xpert Xpress SARS-CoV-2/FLU/RSV testing.  Fact Sheet for Patients: EntrepreneurPulse.com.au  Fact Sheet for Healthcare Providers: IncredibleEmployment.be  This test is not yet  approved or cleared by the [place] FDA and has been authorized for detection and/or diagnosis of SARS-CoV-2 by FDA under an Emergency Use Authorization (EUA). This EUA will remain in effect (meaning this test can be used) for the duration of the COVID-19 declaration under Section 564(b)(1) of the Act, 21 U.S.C. section 360bbb-3(b)(1), unless the authorization is terminated or revoked.  Performed at [facility], [address] [place], [place] [address]   MRSA PCR Screening     Status: None   Collection Time: 12/05/19  4:45 AM   Specimen: Nasal Mucosa; Nasopharyngeal  Result Value Ref Range Status   MRSA by PCR NEGATIVE NEGATIVE Final    Comment:        The GeneXpert MRSA Assay (FDA approved for NASAL specimens only), is one component of a comprehensive MRSA colonization surveillance program. It is not intended to diagnose MRSA infection nor to guide or monitor treatment for  MRSA infections. Performed at [facility], [address] [place], [place] [address]          Radiology Studies: CT Chest W Contrast  Result Date: 12/05/2019 CLINICAL DATA:  Acute pain due to trauma. Left-sided displaced rib fractures. EXAM: CT CHEST WITH CONTRAST CT ABDOMEN AND PELVIS WITH CONTRAST TECHNIQUE: Multidetector CT imaging of the chest was performed using the standard protocol during bolus administration of intravenous contrast. Multiplanar CT image reconstructions were obtained. Multidetector CT imaging of the abdomen and pelvis was performed using the standard protocol during bolus administration of intravenous contrast. CONTRAST:  168mL OMNIPAQUE IOHEXOL 300 MG/ML  SOLN COMPARISON:  CT angio of the chest dated November 14, 2013. FINDINGS: CTA CHEST FINDINGS Cardiovascular: There are atherosclerotic changes of the thoracic aorta without evidence for dissection. The arch vessels are patent where visualized. There is no large centrally located pulmonary embolism.  Detection of smaller pulmonary emboli is not possible on this study due to contrast timing. The heart is enlarged. There are coronary artery calcifications. Mediastinum/Nodes: -- No mediastinal lymphadenopathy. -- No hilar lymphadenopathy. -- No axillary lymphadenopathy. -- No supraclavicular lymphadenopathy. -- Normal thyroid gland where visualized. -  Unremarkable esophagus. Lungs/Pleura: There are severe emphysematous changes bilaterally. There is no pneumothorax. Bibasilar atelectasis is noted. There are trace to small bilateral pleural effusions. There is subcutaneous gas along the patient's left flank. Musculoskeletal: There is an acute displaced fracture involving the eighth rib on the left. CT ABDOMEN and PELVIS FINDINGS Hepatobiliary: The liver is normal. Status post cholecystectomy.There is no biliary ductal dilation. Pancreas: Normal contours without ductal dilatation. No peripancreatic fluid collection. Spleen: Unremarkable. Adrenals/Urinary Tract: --Adrenal glands: Unremarkable. --Right kidney/ureter: No hydronephrosis or radiopaque kidney stones. --Left kidney/ureter: There is a punctate nonobstructing stone in the interpolar region of the left kidney. --Urinary bladder: Unremarkable. Stomach/Bowel: --Stomach/Duodenum: No hiatal hernia or other gastric abnormality. Normal duodenal course and caliber. --Small bowel: Unremarkable. --Colon: Rectosigmoid diverticulosis without acute inflammation. --Appendix: Normal. Vascular/Lymphatic: Atherosclerotic changes are noted throughout the abdominal aorta. There is an infrarenal fusiform abdominal aortic aneurysm measuring approximately 5 x 4.6 cm. 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 The right common iliac artery is ectatic measuring 1.9 cm. --No retroperitoneal lymphadenopathy. --No mesenteric lymphadenopathy. --No pelvic or inguinal lymphadenopathy. Reproductive: Unremarkable Other: There is a small amount of free  fluid in the patient's abdomen. The abdominal wall is normal. Musculoskeletal. There is an age-indeterminate compression fracture of the L1 vertebral body with approximately 20% height loss anteriorly. There is no retropulsion. Degenerative changes are noted throughout the lumbar spine. IMPRESSION: 1. Acute displaced fracture involving the eighth rib on the left. No pneumothorax. 2. Subcutaneous gas along the patient's left flank. 3. Age-indeterminate compression fracture of the L1 vertebral body with approximately 20% height loss anteriorly. There is no retropulsion. 4. Infrarenal fusiform abdominal aortic aneurysm measuring approximately 5 x 4.6 cm. Recommend follow-up every 6 months and vascular consultation. This recommendation follows ACR consensus guidelines: White Paper of the ACR Incidental Findings Committee II on Vascular Findings. J Am Coll Radiol 2013; 10:789-794. 5. 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 6. Trace to small bilateral pleural effusions. 7. Cardiomegaly and coronary artery disease. 8. Punctate nonobstructing stone in the interpolar region of the left kidney. 9. Rectosigmoid diverticulosis without acute inflammation. 10. Trace ascites. Aortic Atherosclerosis (ICD10-I70.0) and Emphysema (ICD10-J43.9). Electronically Signed   By: Constance Holster M.D.   On: 12/05/2019 01:11  CT ABDOMEN PELVIS W CONTRAST  Result Date: 12/05/2019 CLINICAL DATA:  Acute pain due to trauma. Left-sided displaced rib fractures. EXAM: CT CHEST WITH CONTRAST CT ABDOMEN AND PELVIS WITH CONTRAST TECHNIQUE: Multidetector CT imaging of the chest was performed using the standard protocol during bolus administration of intravenous contrast. Multiplanar CT image reconstructions were obtained. Multidetector CT imaging of the abdomen and pelvis was performed using the standard protocol during bolus administration of intravenous contrast. CONTRAST:  175mL OMNIPAQUE IOHEXOL 300 MG/ML  SOLN  COMPARISON:  CT angio of the chest dated November 14, 2013. FINDINGS: CTA CHEST FINDINGS Cardiovascular: There are atherosclerotic changes of the thoracic aorta without evidence for dissection. The arch vessels are patent where visualized. There is no large centrally located pulmonary embolism. Detection of smaller pulmonary emboli is not possible on this study due to contrast timing. The heart is enlarged. There are coronary artery calcifications. Mediastinum/Nodes: -- No mediastinal lymphadenopathy. -- No hilar lymphadenopathy. -- No axillary lymphadenopathy. -- No supraclavicular lymphadenopathy. -- Normal thyroid gland where visualized. -  Unremarkable esophagus. Lungs/Pleura: There are severe emphysematous changes bilaterally. There is no pneumothorax. Bibasilar atelectasis is noted. There are trace to small bilateral pleural effusions. There is subcutaneous gas along the patient's left flank. Musculoskeletal: There is an acute displaced fracture involving the eighth rib on the left. CT ABDOMEN and PELVIS FINDINGS Hepatobiliary: The liver is normal. Status post cholecystectomy.There is no biliary ductal dilation. Pancreas: Normal contours without ductal dilatation. No peripancreatic fluid collection. Spleen: Unremarkable. Adrenals/Urinary Tract: --Adrenal glands: Unremarkable. --Right kidney/ureter: No hydronephrosis or radiopaque kidney stones. --Left kidney/ureter: There is a punctate nonobstructing stone in the interpolar region of the left kidney. --Urinary bladder: Unremarkable. Stomach/Bowel: --Stomach/Duodenum: No hiatal hernia or other gastric abnormality. Normal duodenal course and caliber. --Small bowel: Unremarkable. --Colon: Rectosigmoid diverticulosis without acute inflammation. --Appendix: Normal. Vascular/Lymphatic: Atherosclerotic changes are noted throughout the abdominal aorta. There is an infrarenal fusiform abdominal aortic aneurysm measuring approximately 5 x 4.6 cm. There is aneurysmal  dilatation of the left common iliac artery measuring 2.2 cm. The right common iliac artery is ectatic measuring 1.9 cm. --No retroperitoneal lymphadenopathy. --No mesenteric lymphadenopathy. --No pelvic or inguinal lymphadenopathy. Reproductive: Unremarkable Other: There is a small amount of free fluid in the patient's abdomen. The abdominal wall is normal. Musculoskeletal. There is an age-indeterminate compression fracture of the L1 vertebral body with approximately 20% height loss anteriorly. There is no retropulsion. Degenerative changes are noted throughout the lumbar spine. IMPRESSION: 1. Acute displaced fracture involving the eighth rib on the left. No pneumothorax. 2. Subcutaneous gas along the patient's left flank. 3. Age-indeterminate compression fracture of the L1 vertebral body with approximately 20% height loss anteriorly. There is no retropulsion. 4. Infrarenal fusiform abdominal aortic aneurysm measuring approximately 5 x 4.6 cm. Recommend follow-up every 6 months and vascular consultation. This recommendation follows ACR consensus guidelines: White Paper of the ACR Incidental Findings Committee II on Vascular Findings. J Am Coll Radiol 2013; 10:789-794. 5. 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 6. Trace to small bilateral pleural effusions. 7. Cardiomegaly and coronary artery disease. 8. Punctate nonobstructing stone in the interpolar region of the left kidney. 9. Rectosigmoid diverticulosis without acute inflammation. 10. Trace ascites. Aortic Atherosclerosis (ICD10-I70.0) and Emphysema (ICD10-J43.9). Electronically Signed   By: Constance Holster M.D.   On: 12/05/2019 01:11   DG CHEST PORT 1 VIEW  Result Date: 12/05/2019 CLINICAL DATA:  Shortness of breath. Respiratory failure with hypoxia EXAM: PORTABLE CHEST 1 VIEW COMPARISON:  12/05/2019 FINDINGS: Cardiac enlargement with pulmonary vascular congestion. Interstitial changes in the lungs, likely edema. Changes  are progressing since previous study. Probable small left pleural effusion or basilar atelectasis. Subcutaneous emphysema in the left chest wall. Previous resection or resorption of the distal clavicles. IMPRESSION: Cardiac enlargement with pulmonary vascular congestion and interstitial edema, progressing since previous study. Electronically Signed   By: Lucienne Capers M.D.   On: 12/05/2019 06:24   DG Chest Portable 1 View  Result Date: 12/05/2019 CLINICAL DATA:  Shortness of breath. Decreased oxygen saturation. Recent fall with rib injury. EXAM: PORTABLE CHEST 1 VIEW COMPARISON:  12/04/2019 FINDINGS: Shallow inspiration. Cardiac enlargement. No vascular congestion. Linear atelectasis in the lung bases, greater on the left. Probable small left pleural effusion. Displaced left rib fractures with subcutaneous emphysema again demonstrated. No developing pneumothorax. No change since prior study. IMPRESSION: Shallow inspiration with atelectasis in the lung bases and small left pleural effusion. Multiple left rib fractures with subcutaneous emphysema. No developing pneumothorax. Electronically Signed   By: Lucienne Capers M.D.   On: 12/05/2019 00:22   DG Chest Portable 1 View  Result Date: 12/04/2019 CLINICAL DATA:  Severe left rib pain after a fall. EXAM: PORTABLE CHEST 1 VIEW COMPARISON:  08/22/2019 FINDINGS: Shallow inspiration. Cardiac enlargement with mild pulmonary vascular congestion. Interstitial changes in the bases likely represent early edema. Linear atelectasis in both lung bases, greater on the left. Since the prior study, there are new displaced fractures of the anterolateral left eighth and ninth ribs with subcutaneous emphysema in the left chest wall. No visible pneumothorax. Calcification of the aorta. Postoperative changes in the cervical spine. IMPRESSION: 1. Cardiac enlargement with mild pulmonary vascular congestion and early interstitial edema. 2. Acute displaced fractures of the left  eighth and ninth ribs with subcutaneous emphysema in the left chest wall and atelectasis in the left base. Electronically Signed   By: Lucienne Capers M.D.   On: 12/04/2019 23:32   ECHOCARDIOGRAM COMPLETE BUBBLE STUDY  Result Date: 12/05/2019    ECHOCARDIOGRAM REPORT   Patient Name:   Rick Church Date of Exam: 12/05/2019 Medical Rec #:  [id number]      Height:       69.0 in Accession #:    [id number]     Weight:       180.6 lb Date of Birth:  [birth year]       BSA:          1.978 m Patient Age:    43 years       BP:           137/74 mmHg Patient Gender: M              HR:           58 bpm. Exam Location:  Inpatient Procedure: 2D Echo, Color Doppler, Cardiac Doppler and Saline Contrast Bubble            Study Indications:    O71.21 Acute diastolic (congestive) heart failure  History:        Patient has prior history of Echocardiogram examinations, most                 recent 02/18/2019. Hypoxia.  Sonographer:    Merrie Roof RDCS Referring Phys: [id number] Krishauna Schatzman P Yessenia Maillet IMPRESSIONS  1. Left ventricular ejection fraction, by estimation, is 65 to 70%. The left ventricle has normal function. The left ventricle has no regional wall motion abnormalities. There is mild left ventricular hypertrophy. Left ventricular  diastolic parameters are consistent with Grade I diastolic dysfunction (impaired relaxation).  2. Right ventricular systolic function is normal. The right ventricular size is mildly enlarged.  3. Left atrial size was severely dilated.  4. Right atrial size was severely dilated.  5. The mitral valve is normal in structure. No evidence of mitral valve regurgitation. No evidence of mitral stenosis.  6. DI - 0.28. The aortic valve is calcified. Aortic valve regurgitation is not visualized. Moderate aortic valve stenosis. Aortic valve mean gradient measures 23.0 mmHg. Aortic valve Vmax measures 3.22 m/s.  7. The inferior vena cava is dilated in size with <50% respiratory variability, suggesting right atrial  pressure of 15 mmHg.  8. Agitated saline contrast bubble study was negative, with no evidence of any interatrial shunt. FINDINGS  Left Ventricle: Left ventricular ejection fraction, by estimation, is 65 to 70%. The left ventricle has normal function. The left ventricle has no regional wall motion abnormalities. The left ventricular internal cavity size was normal in size. There is  mild left ventricular hypertrophy. Left ventricular diastolic parameters are consistent with Grade I diastolic dysfunction (impaired relaxation). Right Ventricle: The right ventricular size is mildly enlarged. No increase in right ventricular wall thickness. Right ventricular systolic function is normal. Left Atrium: Left atrial size was severely dilated. Right Atrium: Right atrial size was severely dilated. Pericardium: There is no evidence of pericardial effusion. Mitral Valve: The mitral valve is normal in structure. No evidence of mitral valve regurgitation. No evidence of mitral valve stenosis. Tricuspid Valve: The tricuspid valve is normal in structure. Tricuspid valve regurgitation is not demonstrated. No evidence of tricuspid stenosis. Aortic Valve: DI - 0.28. The aortic valve is calcified. Aortic valve regurgitation is not visualized. Moderate aortic stenosis is present. Aortic valve mean gradient measures 23.0 mmHg. Aortic valve peak gradient measures 41.3 mmHg. Aortic valve area, by  VTI measures 0.88 cm. Pulmonic Valve: The pulmonic valve was normal in structure. Pulmonic valve regurgitation is not visualized. No evidence of pulmonic stenosis. Aorta: The aortic root is normal in size and structure. Venous: The inferior vena cava is dilated in size with less than 50% respiratory variability, suggesting right atrial pressure of 15 mmHg. IAS/Shunts: No atrial level shunt detected by color flow Doppler. Agitated saline contrast was given intravenously to evaluate for intracardiac shunting. Agitated saline contrast bubble study  was negative, with no evidence of any interatrial shunt.  LEFT VENTRICLE PLAX 2D LVIDd:         4.50 cm      Diastology LVIDs:         3.00 cm      LV e' medial:    7.72 cm/s LV PW:         1.10 cm      LV E/e' medial:  14.2 LV IVS:        1.20 cm      LV e' lateral:   8.81 cm/s LVOT diam:     2.00 cm      LV E/e' lateral: 12.5 LV SV:         63 LV SV Index:   32 LVOT Area:     3.14 cm  LV Volumes (MOD) LV vol d, MOD A4C: 125.0 ml LV vol s, MOD A4C: 36.7 ml LV SV MOD A4C:     125.0 ml RIGHT VENTRICLE          IVC RV Basal diam:  4.65 cm  IVC diam: 2.30 cm RV Mid diam:  3.60 cm LEFT ATRIUM              Index       RIGHT ATRIUM           Index LA diam:        4.00 cm  2.02 cm/m  RA Area:     27.70 cm LA Vol (A2C):   145.0 ml 73.29 ml/m RA Volume:   86.70 ml  43.82 ml/m LA Vol (A4C):   72.0 ml  36.39 ml/m LA Biplane Vol: 105.0 ml 53.08 ml/m  AORTIC VALVE AV Area (Vmax):    0.90 cm AV Area (Vmean):   0.93 cm AV Area (VTI):     0.88 cm AV Vmax:           321.50 cm/s AV Vmean:          223.000 cm/s AV VTI:            0.715 m AV Peak Grad:      41.3 mmHg AV Mean Grad:      23.0 mmHg LVOT Vmax:         92.30 cm/s LVOT Vmean:        66.100 cm/s LVOT VTI:          0.200 m LVOT/AV VTI ratio: 0.28  AORTA Ao Root diam: 3.60 cm Ao Asc diam:  3.40 cm MITRAL VALVE MV Area (PHT): 2.24 cm     SHUNTS MV Decel Time: 338 msec     Systemic VTI:  0.20 m MV E velocity: 110.00 cm/s  Systemic Diam: 2.00 cm MV A velocity: 121.00 cm/s MV E/A ratio:  0.91 Candee Furbish MD Electronically signed by Candee Furbish MD Signature Date/Time: 12/05/2019/1:46:14 PM    Final         Scheduled Meds: . acetaminophen  1,000 mg Oral TID  . amLODipine  10 mg Oral QHS  . arformoterol  15 mcg Nebulization BID  . aspirin EC  81 mg Oral QHS  . budesonide (PULMICORT) nebulizer solution  0.25 mg Nebulization BID  . Chlorhexidine Gluconate Cloth  6 each Topical Daily  . citalopram  40 mg Oral QHS  . furosemide  40 mg Intravenous BID  .  gabapentin  300 mg Oral BID  . heparin  5,000 Units Subcutaneous Q8H  . ipratropium-albuterol  3 mL Nebulization Q6H  . multivitamin with minerals  1 tablet Oral QHS  . potassium chloride  20 mEq Oral Daily  . rosuvastatin  20 mg Oral Daily  . traZODone  100 mg Oral QHS   Continuous Infusions: . azithromycin 500 mg (12/05/19 1256)  . cefTRIAXone (ROCEPHIN)  IV 2 g (12/06/19 0816)     LOS: 1 day    Time spent: 35 minutes    Edwin Dada, MD [facility] 12/06/2019, 9:23 AM     Please page though [facility] or Epic secure chat:  For password, contact charge nurse

## 2019-12-06 NOTE — Evaluation (Signed)
Occupational Therapy Evaluation Patient Details Name: MASSIMILIANO ROHLEDER MRN: [id number] DOB: 11/03/1940 Today's Date: 12/06/2019    History of Present Illness 79 y/o M admitted 11/24 after mechanical fall at home 3 days prior to admit with left sided rib pain. Found to have rib fractures and acute hypoxic respiratory failure. Work up notable for negative CT with contrast of chest for large vessel embolism per chart.   Clinical Impression   Patient admitted for the following diagnosis.  Presents with dyspnea on exertion, unsteadiness on his feet, and pain to his L side due to a recent fall and rib fractures.  At home he lives alone, his brother and nephew check up on him.  He cares for his own meds, drives, completes his own meal prep, and keeps on what home management he can. He was not using an assistive device for mobility.  Currently he is needing Min Guard with transfers, and up to [place] with self care.  OT will continue to follow in the acute setting, he is hoping to return home with home health if needed.  He does not use oxygen at home.    Follow Up Recommendations  Home health OT;Supervision - Intermittent    Equipment Recommendations  None recommended by OT    Recommendations for Other Services       Precautions / Restrictions Precautions Precautions: Fall Precaution Comments: rib fractures L side. Restrictions Weight Bearing Restrictions: No      Mobility Bed Mobility Overal bed mobility: Needs Assistance Bed Mobility: Supine to Sit     Supine to sit: Min assist;HOB elevated     General bed mobility comments: went to the left.    Transfers Overall transfer level: Needs assistance   Transfers: Sit to/from Stand;Stand Pivot Transfers Sit to Stand: Min guard Stand pivot transfers: Min guard            Balance Overall balance assessment: Mild deficits observed, not formally tested                                         ADL either performed  or assessed with clinical judgement   ADL Overall ADL's : Needs assistance/impaired Eating/Feeding: Independent;Sitting   Grooming: Wash/dry hands;Wash/dry face;Set up   Upper Body Bathing: Set up;Sitting       Upper Body Dressing : Minimal assistance;Sitting   Lower Body Dressing: Minimal assistance;Sit to/from stand               Functional mobility during ADLs: Min guard General ADL Comments: reaching for arm rests and side rails for stability.     Vision Baseline Vision/History: Wears glasses Wears Glasses: At all times Patient Visual Report: No change from baseline       Perception     Praxis      Pertinent Vitals/Pain Pain Assessment: Faces Faces Pain Scale: Hurts even more Pain Location: L ribs with movement Pain Descriptors / Indicators: Grimacing;Guarding Pain Intervention(s): Monitored during session;Repositioned     Hand Dominance Right   Extremity/Trunk Assessment Upper Extremity Assessment Upper Extremity Assessment: Overall WFL for tasks assessed   Lower Extremity Assessment Lower Extremity Assessment: Defer to PT evaluation   Cervical / Trunk Assessment Cervical / Trunk Assessment: Normal   Communication Communication Communication: No difficulties   Cognition Arousal/Alertness: Awake/alert Behavior During Therapy: WFL for tasks assessed/performed Overall Cognitive Status: Within Functional Limits for tasks assessed  General Comments: Still not sure of details surrounding fall.   General Comments   could not get a good O2 reading.      Exercises     Shoulder Instructions      Home Living Family/patient expects to be discharged to:: Private residence Living Arrangements: Alone Available Help at Discharge: Family;Available PRN/intermittently (He, his brother and nephew watch out for one another.) Type of Home: House Home Access: Stairs to enter [employer] of Steps:  3 Entrance Stairs-Rails: Left Home Layout: One level     Bathroom Shower/Tub: Teacher, early years/pre: Standard     Home Equipment: Cane - single point;Shower seat;Grab bars - tub/shower;Grab bars - toilet;Walker - 2 wheels          Prior Functioning/Environment Level of Independence: Independent        Comments: ADLs, IADLs, and Drives short distances.        OT Problem List: Decreased activity tolerance;Impaired balance (sitting and/or standing);Pain      OT Treatment/Interventions: Self-care/ADL training;Therapeutic exercise;Balance training;Therapeutic activities    OT Goals(Current goals can be found in the care plan section) Acute Rehab OT Goals Patient Stated Goal: Hoping to can go home with [facility] if needed. OT Goal Formulation: With patient Time For Goal Achievement: 12/20/19 ADL Goals Pt Will Perform Grooming: with set-up;standing Pt Will Perform Lower Body Bathing: with set-up;sit to/from stand Pt Will Perform Lower Body Dressing: with set-up;sit to/from stand Pt Will Transfer to Toilet: Independently;ambulating;regular height toilet  OT Frequency: Min 2X/week   Barriers to D/C:    medical status.       Co-evaluation              AM-PAC OT "6 Clicks" Daily Activity     Outcome Measure Help from another person eating meals?: None Help from another person taking care of personal grooming?: A Little Help from another person toileting, which includes using toliet, bedpan, or urinal?: A Little Help from another person bathing (including washing, rinsing, drying)?: A Little Help from another person to put on and taking off regular upper body clothing?: A Little Help from another person to put on and taking off regular lower body clothing?: A Little 6 Click Score: 19   End of Session Equipment Utilized During Treatment: Oxygen Nurse Communication: Mobility status  Activity Tolerance: Patient tolerated treatment well Patient left: in  chair;with call bell/phone within reach;with chair alarm set;with nursing/sitter in room  OT Visit Diagnosis: Unsteadiness on feet (R26.81);Other abnormalities of gait and mobility (R26.89);History of falling (Z91.81);Pain Pain - part of body:  (ribs)                Time: 9166-0600 OT Time Calculation (min): 28 min Charges:  OT General Charges $OT Visit: 1 Visit OT Evaluation $OT Eval Moderate Complexity: 1 Mod OT Treatments $Self Care/Home Management : 8-22 mins  12/06/2019  Rich, OTR/L  Acute Rehabilitation Services  Office:  [place] 12/06/2019, 11:31 AM

## 2019-12-06 NOTE — Plan of Care (Signed)
  Problem: Education: Goal: Knowledge of General Education information will improve Description: Including pain rating scale, medication(s)/side effects and non-pharmacologic comfort measures Outcome: Progressing   Problem: Clinical Measurements: Goal: Ability to maintain clinical measurements within normal limits will improve Outcome: Progressing   Problem: Clinical Measurements: Goal: Diagnostic test results will improve Outcome: Progressing   Problem: Clinical Measurements: Goal: Respiratory complications will improve Outcome: Progressing   Problem: Activity: Goal: Risk for activity intolerance will decrease Outcome: Progressing   Problem: Nutrition: Goal: Adequate nutrition will be maintained Outcome: Progressing   Problem: Coping: Goal: Level of anxiety will decrease Outcome: Progressing   Problem: Pain Managment: Goal: General experience of comfort will improve Outcome: Progressing   Problem: Elimination: Goal: Will not experience complications related to urinary retention Outcome: Progressing   Problem: Skin Integrity: Goal: Risk for impaired skin integrity will decrease Outcome: Progressing

## 2019-12-07 DIAGNOSIS — R0902 Hypoxemia: Secondary | ICD-10-CM | POA: Diagnosis not present

## 2019-12-07 LAB — BASIC METABOLIC PANEL
Anion gap: 13 (ref 5–15)
BUN: 27 mg/dL — ABNORMAL HIGH (ref 8–23)
CO2: 33 mmol/L — ABNORMAL HIGH (ref 22–32)
Calcium: 8.5 mg/dL — ABNORMAL LOW (ref 8.9–10.3)
Chloride: 98 mmol/L (ref 98–111)
Creatinine, Ser: 1.19 mg/dL (ref 0.61–1.24)
GFR, Estimated: >60 mL/min (ref >60)
Glucose, Bld: 101 mg/dL — ABNORMAL HIGH (ref 70–99)
Potassium: 3.8 mmol/L (ref 3.5–5.1)
Sodium: 144 mmol/L (ref 135–145)

## 2019-12-07 LAB — CBC
HCT: 46.1 % (ref 39.0–52.0)
Hemoglobin: 14.4 g/dL (ref 13.0–17.0)
MCH: 30 pg (ref 26.0–34.0)
MCHC: 31.2 g/dL (ref 30.0–36.0)
MCV: 96 fL (ref 80.0–100.0)
Platelets: 174 10*3/uL (ref 150–400)
RBC: 4.8 MIL/uL (ref 4.22–5.81)
RDW: 18.5 % — ABNORMAL HIGH (ref 11.5–15.5)
WBC: 11 10*3/uL — ABNORMAL HIGH (ref 4.0–10.5)
nRBC: 0 % (ref 0.0–0.2)

## 2019-12-07 MED ORDER — FUROSEMIDE 40 MG PO TABS
40.0000 mg | ORAL_TABLET | Freq: Every day | ORAL | Status: DC
  Administered 2019-12-08: 40 mg via ORAL
  Filled 2019-12-07: qty 1

## 2019-12-07 NOTE — Progress Notes (Signed)
PROGRESS NOTE    [name]  QMG:[id number] DOB: Jul 15, 1940 DOA: 12/04/2019 PCP: Aura Dials, MD      Brief Narrative:  [name] is a 79 y.o. 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  Patient confused and unable to participate in history taking.  Evidently a family member went to check on him because he had not heard from him in a few days, and found his home in disarray and to the ER.  In the ER, SPO2 initially 54% on room air, placed on Ventimask.  WBC 13 K, mild AKI, BNP 596, chest x-ray clear without pneumothorax.  Covid negative, flu negative.  CT C/A/P showed emphysema, LEFT 8th rib fracture, no pneumothorax bbut overlying subcutaneous emphysema, and AAA.         Assessment & Plan:  Acute respiratory failure with hypoxia Acute diastolic CHF Multifactorial, appears to be some degree of atelectasis, fluid, or infection in the lower lobes bilaterally causing shunting, superimposed severe centrilobular emphysema, worsened by his rib fractures.  CXR initially normal.  CT chest with contrast ruled out large PE, and showed no significant airspace disease, but did show severe emphysema, chronic, and subsequently patient's O2 requirements increased dramatically  Repeat chest x-ray showed advancing bilateral infiltrates, still no pneumothorax.  Patient started on antibiotics, diuretics, bronchodilators, pain control, and pulmonology were consulted.  Echocardiogram shows grade 1 diastolic dysfunction, mild to moderate AS  Patient presented with dyspnea, bilateral pulmonary edema, and has not been diuresed 5 L, net -5.7 L on admission.   -Stop IV Lasix, transition oral -Continue strict ins and outs -Continue ceftriaxone and azithromycin -Continue aggressive  bronchodilators -Continue mobilization, pain control, chest PT  -Continue inhaled steroid  -Consult CCM, appreciate cares   Acute metabolic encephalopathy Due to severe hypoxia, resolved  Rib fracture -Continue home Dilaudid -Continue schedule Tylenol -Aggressive pulmonary toilet  COPD History smoking -Continue inhaled steroid, LABA, LAMA -Continue frequent bronchodilators  Coronary and vascular disease secondary prevention Hypertension BP normal -Continue amlodipine, Crestor, aspirin -Hold ARB  Depression neuropathy -Continue citalopram -Continue gabapentin  AAA -Outpatient follow up  AKI Baseline Cr 1.1, here up to 1.47, resolved with diuresis. -Hold ARB        Disposition: Status is: Inpatient  Remains inpatient appropriate because:IV treatments appropriate due to intensity of illness or inability to take PO   Dispo: The patient is from: Home              Anticipated d/c is to: SNF              Anticipated d/c date is: > 3 days              Patient currently is not medically stable to d/c.    The patient was admitted with severe acute hypoxic respiratory failure, he is starting to improve, but is still requiring 6 L of oxygen, and is dyspneic with minimal exertion.          MDM: The below labs and imaging reports reviewed and summarized above.  Medication management as above.     DVT prophylaxis: heparin injection 5,000 Units Start: 12/05/19 0615 SCDs Start: 12/05/19 0521  Code Status: FULL Family Communication:      Consultants:   CCM  Procedures:   11/25 echocardiogram-EF normal, grade 1 diastolic dysfunction,  moderate AS  Antimicrobials:   CTX and azithromycin 11/25 >>   Culture data:   None           Subjective: Feels well.  His rib pain is improved.  He still has some sputum.  He has no fever, vomiting, has peripheral swelling.           Objective: Vitals:   12/07/19 0800 12/07/19 0839 12/07/19  1155 12/07/19 1549  BP: (!) 103/54     Pulse: (!) 56     Resp: 18     Temp: 98.2 F (36.8 C)  97.9 F (36.6 C) 98.3 F (36.8 C)  TempSrc: Oral  Oral Oral  SpO2: 96% (!) 87%    Weight:      Height:        Intake/Output Summary (Last 24 hours) at 12/07/2019 1555 Last data filed at 12/07/2019 1258 Gross per 24 hour  Intake 590 ml  Output 1950 ml  Net -1360 ml   Filed Weights   12/05/19 0430 12/06/19 0402 12/07/19 0500  Weight: 81.9 kg 81.8 kg 78 kg    Examination: General appearance: Elderly adult male, sitting on the edge of the bed, eating breakfast, interactive, pleasant.     HEENT: Pupils equal round and reactive to light, oropharynx moist, no oral lesions, dentition normal.,  Hearing normal Skin: Skin warm and dry, no suspicious rashes or lesions, no crepitus over the left ribs. Cardiac: Heart rate regular, normal rhythm, systolic murmur noted, no lower extremity edema, JVP normal. Respiratory: Crackles resolved, diminished throughout, no wheezing. Abdomen: Abdomen soft without tenderness palpation or guarding, no ascites MSK: Normal muscle bulk and tone, Neuro: Extraocular movements intact, moves all extremities with generalized weakness, but normal coordination, speech fluent Psych: Attention normal, affect pleasant, judgment insight appear appropriate.      Data Reviewed: I have personally reviewed following labs and imaging studies:  CBC: Recent Labs  Lab 12/04/19 2345 12/06/19 0017 12/07/19 0059  WBC 13.7* 13.2* 11.0*  NEUTROABS 10.1* 11.0*  --   HGB 14.1 13.5 14.4  HCT 44.2 42.5 46.1  MCV 96.5 97.0 96.0  PLT 224 193 628   Basic Metabolic Panel: Recent Labs  Lab 12/04/19 2345 12/06/19 0017 12/07/19 0059  NA 137 141 144  K 4.6 3.7 3.8  CL 103 100 98  CO2 25 31 33*  GLUCOSE 89 113* 101*  BUN 38* 29* 27*  CREATININE 1.47* 1.22 1.19  CALCIUM 8.7* 8.3* 8.5*  MG  --  1.9  --    GFR: Estimated Creatinine Clearance: 52 mL/min (by C-G formula  based on SCr of 1.19 mg/dL). Liver Function Tests: Recent Labs  Lab 12/06/19 0017  AST 51*  ALT 87*  ALKPHOS 90  BILITOT 0.8  PROT 5.6*  ALBUMIN 2.9*   No results for input(s): LIPASE, AMYLASE in the last 168 hours. No results for input(s): AMMONIA in the last 168 hours. Coagulation Profile: No results for input(s): INR, PROTIME in the last 168 hours. Cardiac Enzymes: No results for input(s): CKTOTAL, CKMB, CKMBINDEX, TROPONINI in the last 168 hours. BNP (last 3 results) No results for input(s): PROBNP in the last 8760 hours. HbA1C: No results for input(s): HGBA1C in the last 72 hours. CBG: No results for input(s): GLUCAP in the last 168 hours. Lipid Profile: No results for input(s): CHOL, HDL, LDLCALC, TRIG, CHOLHDL, LDLDIRECT in the last 72 hours. Thyroid Function Tests: No results for input(s): TSH, T4TOTAL, FREET4, T3FREE, THYROIDAB in the last 72 hours. Anemia Panel:  No results for input(s): VITAMINB12, FOLATE, FERRITIN, TIBC, IRON, RETICCTPCT in the last 72 hours. Urine analysis:    Component Value Date/Time   COLORURINE YELLOW 02/18/2019 0831   APPEARANCEUR CLEAR 02/18/2019 0831   LABSPEC 1.011 02/18/2019 0831   PHURINE 6.0 02/18/2019 0831   GLUCOSEU NEGATIVE 02/18/2019 0831   HGBUR NEGATIVE 02/18/2019 0831   BILIRUBINUR NEGATIVE 02/18/2019 0831   KETONESUR NEGATIVE 02/18/2019 0831   PROTEINUR NEGATIVE 02/18/2019 0831   UROBILINOGEN 1.0 01/23/2014 0547   NITRITE NEGATIVE 02/18/2019 0831   LEUKOCYTESUR NEGATIVE 02/18/2019 0831   Sepsis Labs: @LABRCNTIP (procalcitonin:4,lacticacidven:4)  ) Recent Results (from the past 240 hour(s))  Resp Panel by RT-PCR (Flu A&B, Covid) Nasopharyngeal Swab     Status: None   Collection Time: 12/05/19  1:25 AM   Specimen: Nasopharyngeal Swab; Nasopharyngeal(NP) swabs in vial transport medium  Result Value Ref Range Status   SARS Coronavirus 2 by RT PCR NEGATIVE NEGATIVE Final    Comment: (NOTE) SARS-CoV-2 target nucleic  acids are NOT DETECTED.  The SARS-CoV-2 RNA is generally detectable in upper respiratory specimens during the acute phase of infection. The lowest concentration of SARS-CoV-2 viral copies this assay can detect is 138 copies/mL. A negative result does not preclude SARS-Cov-2 infection and should not be used as the sole basis for treatment or other patient management decisions. A negative result may occur with  improper specimen collection/handling, submission of specimen other than nasopharyngeal swab, presence of viral mutation(s) within the areas targeted by this assay, and inadequate number of viral copies(<138 copies/mL). A negative result must be combined with clinical observations, patient history, and epidemiological information. The expected result is Negative.  Fact Sheet for Patients:  EntrepreneurPulse.com.au  Fact Sheet for Healthcare Providers:  IncredibleEmployment.be  This test is no t yet approved or cleared by the [place] FDA and  has been authorized for detection and/or diagnosis of SARS-CoV-2 by FDA under an Emergency Use Authorization (EUA). This EUA will remain  in effect (meaning this test can be used) for the duration of the COVID-19 declaration under Section 564(b)(1) of the Act, 21 U.S.C.section 360bbb-3(b)(1), unless the authorization is terminated  or revoked sooner.       Influenza A by PCR NEGATIVE NEGATIVE Final   Influenza B by PCR NEGATIVE NEGATIVE Final    Comment: (NOTE) The Xpert Xpress SARS-CoV-2/FLU/RSV plus assay is intended as an aid in the diagnosis of influenza from Nasopharyngeal swab specimens and should not be used as a sole basis for treatment. Nasal washings and aspirates are unacceptable for Xpert Xpress SARS-CoV-2/FLU/RSV testing.  Fact Sheet for Patients: EntrepreneurPulse.com.au  Fact Sheet for Healthcare Providers: IncredibleEmployment.be  This  test is not yet approved or cleared by the [place] FDA and has been authorized for detection and/or diagnosis of SARS-CoV-2 by FDA under an Emergency Use Authorization (EUA). This EUA will remain in effect (meaning this test can be used) for the duration of the COVID-19 declaration under Section 564(b)(1) of the Act, 21 U.S.C. section 360bbb-3(b)(1), unless the authorization is terminated or revoked.  Performed at [facility], [address] [place], [place] [address]   MRSA PCR Screening     Status: None   Collection Time: 12/05/19  4:45 AM   Specimen: Nasal Mucosa; Nasopharyngeal  Result Value Ref Range Status   MRSA by PCR NEGATIVE NEGATIVE Final    Comment:        The GeneXpert MRSA Assay (FDA approved for NASAL specimens only), is one component of  a comprehensive MRSA colonization surveillance program. It is not intended to diagnose MRSA infection nor to guide or monitor treatment for MRSA infections. Performed at [facility], [place] [address] [place], [place] [address]          Radiology Studies: No results found.      Scheduled Meds: . acetaminophen  1,000 mg Oral TID  . amLODipine  10 mg Oral QHS  . arformoterol  15 mcg Nebulization BID  . aspirin EC  81 mg Oral QHS  . budesonide (PULMICORT) nebulizer solution  0.25 mg Nebulization BID  . Chlorhexidine Gluconate Cloth  6 each Topical Daily  . citalopram  40 mg Oral QHS  . furosemide  40 mg Intravenous BID  . gabapentin  300 mg Oral BID  . heparin  5,000 Units Subcutaneous Q8H  . ipratropium-albuterol  3 mL Nebulization BID  . multivitamin with minerals  1 tablet Oral QHS  . potassium chloride  20 mEq Oral Daily  . rosuvastatin  20 mg Oral Daily  . traZODone  100 mg Oral QHS   Continuous Infusions: . azithromycin 500 mg (12/07/19 0934)  . cefTRIAXone (ROCEPHIN)  IV 2 g (12/07/19 1053)     LOS: 2 days    Time spent: 35 minutes    Edwin Dada, MD [facility] 12/07/2019, 3:55 PM     Please page though [place] or Epic secure chat:  For password, contact charge nurse

## 2019-12-07 NOTE — Plan of Care (Signed)
  Problem: Education: Goal: Knowledge of General Education information will improve Description: Including pain rating scale, medication(s)/side effects and non-pharmacologic comfort measures Outcome: Progressing   Problem: Health Behavior/Discharge Planning: Goal: Ability to manage health-related needs will improve Outcome: Progressing   Problem: Clinical Measurements: Goal: Ability to maintain clinical measurements within normal limits will improve Outcome: Progressing   Problem: Clinical Measurements: Goal: Diagnostic test results will improve Outcome: Progressing   Problem: Clinical Measurements: Goal: Respiratory complications will improve Outcome: Progressing   Problem: Activity: Goal: Risk for activity intolerance will decrease Outcome: Progressing   Problem: Nutrition: Goal: Adequate nutrition will be maintained Outcome: Progressing   Problem: Elimination: Goal: Will not experience complications related to bowel motility Outcome: Progressing   Problem: Elimination: Goal: Will not experience complications related to urinary retention Outcome: Progressing   Problem: Pain Managment: Goal: General experience of comfort will improve Outcome: Progressing   Problem: Skin Integrity: Goal: Risk for impaired skin integrity will decrease Outcome: Progressing

## 2019-12-07 NOTE — Evaluation (Signed)
Physical Therapy Evaluation Patient Details Name: Rick Church MRN: [id number] DOB: [birth year] Today's Date: 12/07/2019   History of Present Illness  79 y/o M admitted 11/24 after mechanical fall at home 3 days prior to admit with left sided rib pain. Found to have rib fractures and acute hypoxic respiratory failure. Work up notable for negative CT with contrast of chest for large vessel embolism per chart.  Clinical Impression  Pt admitted with above diagnosis. Pt was able to ambulate with RW in hallway with min assist due to decr safety occasionally. Also cues for pursed lip breathing as he desat on 6L to 84%.  Reviewed incentive spirometer with pt.  Will follow acutely.   Pt currently with functional limitations due to the deficits listed below (see PT Problem List). Pt will benefit from skilled PT to increase their independence and safety with mobility to allow discharge to the venue listed below.      Follow Up Recommendations Home health PT;Supervision - Intermittent    Equipment Recommendations  None recommended by PT    Recommendations for Other Services       Precautions / Restrictions Precautions Precautions: Fall Precaution Comments: rib fractures L side. Restrictions Weight Bearing Restrictions: No      Mobility  Bed Mobility Overal bed mobility: Needs Assistance Bed Mobility: Supine to Sit     Supine to sit: Supervision          Transfers Overall transfer level: Needs assistance Equipment used: Rolling walker (2 wheeled) Transfers: Sit to/from [employer] Transfers Sit to Stand: Min guard;Min assist         General transfer comment: Slight posterior LOB when standing up.   Ambulation/Gait Ambulation/Gait assistance: Min guard;Min assist Gait Distance (Feet): 200 Feet Assistive device: Rolling walker (2 wheeled) Gait Pattern/deviations: Step-through pattern;Decreased stride length;Trunk flexed;Wide base of support   Gait velocity  interpretation: <1.8 ft/sec, indicate of risk for recurrent falls General Gait Details: Pt was able to ambulate with min assist to min guard assist  on unit with RW with occasional cues to steer RW with turns and cues for pursed lip breathing as pt desat at times to 84% on 6L.  Recovered to >90% once back in bed at end of walk. Reviewed incentive spirometer with pt and he practiced.   Stairs            Wheelchair Mobility    Modified Rankin (Stroke Patients Only)       Balance Overall balance assessment: Mild deficits observed, not formally tested                                           Pertinent Vitals/Pain Pain Assessment: Faces Faces Pain Scale: Hurts even more Pain Location: L ribs with movement Pain Descriptors / Indicators: Grimacing;Guarding Pain Intervention(s): Limited activity within patient's tolerance;Monitored during session;Repositioned    Home Living Family/patient expects to be discharged to:: Private residence Living Arrangements: Alone Available Help at Discharge: Family;Available PRN/intermittently (He, his brother and nephew watch out for one another.) Type of Home: House Home Access: Stairs to enter Entrance Stairs-Rails: Left Entrance Stairs-Number of Steps: 3 Home Layout: One level Home Equipment: Cane - single point;Shower seat;Grab bars - tub/shower;Grab bars - toilet;Walker - 4 wheels      Prior Function Level of Independence: Independent         Comments: ADLs, IADLs, and Drives short  distances.     Hand Dominance   Dominant Hand: Right    Extremity/Trunk Assessment   Upper Extremity Assessment Upper Extremity Assessment: Defer to OT evaluation    Lower Extremity Assessment Lower Extremity Assessment: Generalized weakness    Cervical / Trunk Assessment Cervical / Trunk Assessment: Normal  Communication   Communication: No difficulties  Cognition Arousal/Alertness: Awake/alert Behavior During Therapy:  WFL for tasks assessed/performed Overall Cognitive Status: Within Functional Limits for tasks assessed                                 General Comments: Still not sure of details surrounding fall.      General Comments      Exercises General Exercises - Lower Extremity Ankle Circles/Pumps: AROM;Both;10 reps;Seated Long Arc Quad: AROM;Both;10 reps;Seated   Assessment/Plan    PT Assessment Patient needs continued PT services  PT Problem List Decreased balance;Decreased mobility;Decreased activity tolerance;Decreased knowledge of use of DME;Decreased safety awareness;Decreased knowledge of precautions;Cardiopulmonary status limiting activity       PT Treatment Interventions DME instruction;Gait training;Functional mobility training;Therapeutic activities;Therapeutic exercise;Balance training;Patient/family education;Stair training    PT Goals (Current goals can be found in the Care Plan section)  Acute Rehab PT Goals Patient Stated Goal: Hoping to can go home with [facility] if needed. PT Goal Formulation: With patient Time For Goal Achievement: 12/21/19 Potential to Achieve Goals: Good    Frequency Min 3X/week   Barriers to discharge        Co-evaluation               AM-PAC PT "6 Clicks" Mobility  Outcome Measure Help needed turning from your back to your side while in a flat bed without using bedrails?: None Help needed moving from lying on your back to sitting on the side of a flat bed without using bedrails?: None Help needed moving to and from a bed to a chair (including a wheelchair)?: A Little Help needed standing up from a chair using your arms (e.g., wheelchair or bedside chair)?: A Little Help needed to walk in hospital room?: A Little Help needed climbing 3-5 steps with a railing? : A Little 6 Click Score: 20    End of Session Equipment Utilized During Treatment: Gait belt;Oxygen Activity Tolerance: Patient limited by fatigue Patient left: with  call bell/phone within reach;in bed;with bed alarm set Nurse Communication: Mobility status PT Visit Diagnosis: Unsteadiness on feet (R26.81);Muscle weakness (generalized) (M62.81)    Time: 1350-1411 PT Time Calculation (min) (ACUTE ONLY): 21 min   Charges:   PT Evaluation $PT Eval Moderate Complexity: 1 Mod          Raymond Bhardwaj W,PT Acute Rehabilitation Services Pager:  [phone]  Office:  [place] 12/07/2019, 4:10 PM

## 2019-12-08 ENCOUNTER — Inpatient Hospital Stay (HOSPITAL_COMMUNITY): Payer: Medicare Other

## 2019-12-08 DIAGNOSIS — J9602 Acute respiratory failure with hypercapnia: Secondary | ICD-10-CM | POA: Diagnosis not present

## 2019-12-08 DIAGNOSIS — J9601 Acute respiratory failure with hypoxia: Secondary | ICD-10-CM | POA: Diagnosis not present

## 2019-12-08 LAB — BASIC METABOLIC PANEL
Anion gap: 10 (ref 5–15)
BUN: 29 mg/dL — ABNORMAL HIGH (ref 8–23)
CO2: 31 mmol/L (ref 22–32)
Calcium: 8.6 mg/dL — ABNORMAL LOW (ref 8.9–10.3)
Chloride: 101 mmol/L (ref 98–111)
Creatinine, Ser: 1.01 mg/dL (ref 0.61–1.24)
GFR, Estimated: >60 mL/min (ref >60)
Glucose, Bld: 138 mg/dL — ABNORMAL HIGH (ref 70–99)
Potassium: 4.1 mmol/L (ref 3.5–5.1)
Sodium: 142 mmol/L (ref 135–145)

## 2019-12-08 MED ORDER — IPRATROPIUM-ALBUTEROL 0.5-2.5 (3) MG/3ML IN SOLN
3.0000 mL | Freq: Three times a day (TID) | RESPIRATORY_TRACT | Status: DC
  Administered 2019-12-08 – 2019-12-10 (×6): 3 mL via RESPIRATORY_TRACT
  Filled 2019-12-08 (×6): qty 3

## 2019-12-08 MED ORDER — ALPRAZOLAM 0.5 MG PO TABS: 0.5000 mg | ORAL_TABLET | Freq: Two times a day (BID) | ORAL | Status: DC | PRN

## 2019-12-08 MED ORDER — PREDNISONE 20 MG PO TABS
40.0000 mg | ORAL_TABLET | Freq: Every day | ORAL | Status: DC
Start: 2019-12-09 — End: 2019-12-08

## 2019-12-08 MED ORDER — AZITHROMYCIN 500 MG PO TABS
500.0000 mg | ORAL_TABLET | Freq: Once | ORAL | Status: AC
  Administered 2019-12-09: 500 mg via ORAL
  Filled 2019-12-08: qty 1

## 2019-12-08 MED ORDER — PREDNISONE 20 MG PO TABS
40.0000 mg | ORAL_TABLET | Freq: Every day | ORAL | Status: DC
  Administered 2019-12-08 – 2019-12-10 (×3): 40 mg via ORAL
  Filled 2019-12-08 (×3): qty 2

## 2019-12-08 MED ORDER — FUROSEMIDE 10 MG/ML IJ SOLN
40.0000 mg | Freq: Once | INTRAMUSCULAR | Status: AC
  Administered 2019-12-08: 40 mg via INTRAVENOUS
  Filled 2019-12-08: qty 4

## 2019-12-08 MED ORDER — CLONAZEPAM 0.5 MG PO TABS
1.0000 mg | ORAL_TABLET | Freq: Two times a day (BID) | ORAL | Status: DC | PRN
  Administered 2019-12-09: 1 mg via ORAL
  Filled 2019-12-08: qty 2

## 2019-12-08 NOTE — Progress Notes (Signed)
This patient is receiving the antibiotic azithromycin  by the  intravenous route. Based on criteria approved by the Pharmacy and Therapeutics   Committee, and the Infectious Disease Division, the antibiotic is being converted  to equivalent oral dose form(s). These criteria include:   - Patient being treated for a respiratory tract infection, urinary tract infection,  cellulitis, or Clostridium Difficile Associated Diarrhea  - The patient is not neutropenic and does not exhibit a GI malabsorption state  -The patient is eating (either orally or per tube) and/or has been taking other  orally administered medications for at least 24 hours.  - The patient is improving clinically (physician assessment and a 24-hour Tmax  of <100.47F).    If you have questions about this conversion, please contact the pharmacy department.    Thank you.   Wilson Singer, PharmD PGY1 Pharmacy Resident 12/08/2019 2:27 PM

## 2019-12-08 NOTE — Plan of Care (Signed)
  Problem: Education: Goal: Knowledge of General Education information will improve Description: Including pain rating scale, medication(s)/side effects and non-pharmacologic comfort measures Outcome: Progressing   Problem: Clinical Measurements: Goal: Ability to maintain clinical measurements within normal limits will improve Outcome: Progressing   Problem: Clinical Measurements: Goal: Respiratory complications will improve Outcome: Progressing   Problem: Clinical Measurements: Goal: Will remain free from infection Outcome: Progressing   Problem: Nutrition: Goal: Adequate nutrition will be maintained Outcome: Progressing   Problem: Pain Managment: Goal: General experience of comfort will improve Outcome: Progressing

## 2019-12-08 NOTE — Progress Notes (Signed)
SATURATION QUALIFICATIONS: (This note is used to comply with regulatory documentation for home oxygen)  Patient Saturations on 4 Liters of oxygen while Ambulating = 78 %  Patient Saturations on 6 Liters of oxygen while Ambulating = 86 %    Pt tolerated ambulation well, denies SOB, chest pain, distress, but pt seemeed having trouble catching breath in less than 6 L so  pt needs home oxygen.  Palma Holter, RN

## 2019-12-08 NOTE — Progress Notes (Signed)
PROGRESS NOTE    Rick Church  EEF:[id number] DOB: 1940-08-30 DOA: 12/04/2019 PCP: Aura Dials, MD      Brief Narrative:  Rick Church is a 79 y.o. 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  Patient confused and unable to participate in history taking.  Evidently a family member went to check on him because he had not heard from him in a few days, and found his home in disarray and to the ER.  In the ER, SPO2 initially 54% on room air, placed on Ventimask.  WBC 13 K, mild AKI, BNP 596, chest x-ray clear without pneumothorax.  Covid negative, flu negative.  CT C/A/P showed emphysema, LEFT 8th rib fracture, no pneumothorax bbut overlying subcutaneous emphysema, and AAA.         Assessment & Plan:  Acute respiratory failure with hypoxia Acute diastolic CHF Multifactorial, appears to be some degree of atelectasis, fluid, or infection in the lower lobes bilaterally causing shunting, superimposed severe centrilobular emphysema, worsened by his rib fractures.  CXR initially normal.  CT chest with contrast ruled out large PE, and showed no significant airspace disease, but did show severe emphysema, chronic, and subsequently patient's O2 requirements increased dramatically  Repeat chest x-ray showed advancing bilateral infiltrates, still no pneumothorax.  Patient started on antibiotics, diuretics, bronchodilators, pain control, and pulmonology were consulted.  Echocardiogram shows grade 1 diastolic dysfunction, mild to moderate AS  -700 cc yesterday, creatinine stable.   -Continue Lasix -Continue strict ins and outs -Continue ceftriaxone and azithromycin, day 4 of 5 azithromycin, day 4 of 7 of ceftriaxone -Continue aggressive bronchodilators -Continue mobilization, pain control,  chest PT  -Transition steroids to oral -Consult CCM, appreciate cares   Acute metabolic encephalopathy Due to severe hypoxia, resolved  Displaced left eighth rib fracture This appears to be improving -Continue home oral Dilaudid -Continue schedule Tylenol -Aggressive pulmonary toilet  COPD with severe emphysema, probable acute exacerbation History smoking FEV1 35% several years ago not on oxygen at home -Continue LABA, LAMA -Start prednisone  -Continue frequent bronchodilators  Coronary and vascular disease secondary prevention Hypertension BP normal -Continue amlodipine, Crestor, aspirin -Hold ARB  Depression neuropathy -Continue citalopram -Continue gabapentin  AAA -Outpatient follow up  AKI Baseline Cr 1.1, here up to 1.47, resolved with diuresis. -Hold ARB        Disposition: Status is: Inpatient  Remains inpatient appropriate because:IV treatments appropriate due to intensity of illness or inability to take PO   Dispo: The patient is from: Home              Anticipated d/c is to: SNF              Anticipated d/c date is: > 3 days              Patient currently is not medically stable to d/c.    The patient was admitted with severe acute hypoxic respiratory failure, he is starting to improve, but is still requiring 6 L of oxygen, and ambulated just 15 feet and desaturated to 80% on 6 L          MDM: The below labs and imaging reports reviewed and summarized above.  Medication management as above.     DVT prophylaxis: heparin injection 5,000 Units Start: 12/05/19 0615 SCDs Start: 12/05/19 [phone]  Code Status: FULL Family Communication:      Consultants:   CCM  Procedures:   11/25 echocardiogram-EF normal, grade 1 diastolic dysfunction, moderate AS  Antimicrobials:   CTX and azithromycin 11/25 >>   Culture data:   None           Subjective: Still coughing up some sputum.  No new fever, peripheral swelling,  vomiting.  Rib pain is tolerable with his opiate.  No confusion.           Objective: Vitals:   12/08/19 0720 12/08/19 0727 12/08/19 1148 12/08/19 1527  BP:  (!) 117/47 126/62 (!) 114/55  Pulse:  61 63 63  Resp:  14 19 17   Temp:  98.3 F (36.8 C) 98.3 F (36.8 C) 98.7 F (37.1 C)  TempSrc:  Oral Oral Oral  SpO2: 94% 93% 94% 90%  Weight:      Height:        Intake/Output Summary (Last 24 hours) at 12/08/2019 1628 Last data filed at 12/08/2019 1300 Gross per 24 hour  Intake 1190 ml  Output 950 ml  Net 240 ml   Filed Weights   12/06/19 0402 12/07/19 0500 12/08/19 0323  Weight: 81.8 kg 78 kg 76.1 kg    Examination: General appearance: Elderly adult male, sitting on the edge of the bed, interactive     HEENT: Pupils normal, oropharynx moist, no oral lesions, hearing normal Skin: Skin warm and dry, no suspicious rashes or lesions Cardiac: RRR, systolic murmur, no lower extremity edema, JVP normal Respiratory: Bibasilar crackles are resolved, no crackles, lung sounds diminished, diminished throughout, poor air movement Abdomen: Abdomen soft without tenderness palpation or guarding, no ascites or distention MSK: Normal muscle bulk and tone, no clubbing Neuro: Extraocular movements intact, moves all extremities with generalized weakness but normal coordination, speech fluent. Psych: Attention normal, affect normal, judgment insight appear somewhat impaired.  General appearance: Elderly adult male, sitting on the edge of the bed, eating breakfast, interactive, pleasant.     HEENT: Pupils equal round and reactive to light, oropharynx moist, no oral lesions, dentition normal.,  Hearing normal Skin: Skin warm and dry, no suspicious rashes or lesions, no crepitus over the left ribs. Cardiac: Heart rate regular, normal rhythm, systolic murmur noted, no lower extremity edema, JVP normal. Respiratory: Crackles resolved, diminished throughout, no wheezing. Abdomen: Abdomen soft  without tenderness palpation or guarding, no ascites MSK: Normal muscle bulk and tone, Neuro: Extraocular movements intact, moves all extremities with generalized weakness, but normal coordination, speech fluent Psych: Attention normal, affect pleasant, judgment insight appear appropriate.      Data Reviewed: I have personally reviewed following labs and imaging studies:  CBC: Recent Labs  Lab 12/04/19 2345 12/06/19 0017 12/07/19 0059  WBC 13.7* 13.2* 11.0*  NEUTROABS 10.1* 11.0*  --   HGB 14.1 13.5 14.4  HCT 44.2 42.5 46.1  MCV 96.5 97.0 96.0  PLT 224 193 809   Basic Metabolic Panel: Recent Labs  Lab 12/04/19 2345 12/06/19 0017 12/07/19 0059 12/08/19 0018  NA 137 141 144 142  K 4.6 3.7 3.8 4.1  CL 103 100 98 101  CO2 25 31 33* 31  GLUCOSE 89 113* 101* 138*  BUN 38* 29* 27* 29*  CREATININE 1.47* 1.22 1.19 1.01  CALCIUM 8.7* 8.3* 8.5* 8.6*  MG  --  1.9  --   --    GFR: Estimated Creatinine Clearance: 61.3 mL/min (by C-G formula based on SCr of 1.01 mg/dL). Liver Function Tests: Recent Labs  Lab 12/06/19 0017  AST 51*  ALT 87*  ALKPHOS 90  BILITOT 0.8  PROT 5.6*  ALBUMIN 2.9*   No results for input(s): LIPASE, AMYLASE in the last 168 hours. No results for input(s): AMMONIA in the last 168 hours. Coagulation Profile: No results for input(s): INR, PROTIME in the last 168 hours. Cardiac Enzymes: No results for input(s): CKTOTAL, CKMB, CKMBINDEX, TROPONINI in the last 168 hours. BNP (last 3 results) No results for input(s): PROBNP in the last 8760 hours. HbA1C: No results for input(s): HGBA1C in the last 72 hours. CBG: No results for input(s): GLUCAP in the last 168 hours. Lipid Profile: No results for input(s): CHOL, HDL, LDLCALC, TRIG, CHOLHDL, LDLDIRECT in the last 72 hours. Thyroid Function Tests: No results for input(s): TSH, T4TOTAL, FREET4, T3FREE, THYROIDAB in the last 72 hours. Anemia Panel: No results for input(s): VITAMINB12, FOLATE,  FERRITIN, TIBC, IRON, RETICCTPCT in the last 72 hours. Urine analysis:    Component Value Date/Time   COLORURINE YELLOW 02/18/2019 0831   APPEARANCEUR CLEAR 02/18/2019 0831   LABSPEC 1.011 02/18/2019 0831   PHURINE 6.0 02/18/2019 0831   GLUCOSEU NEGATIVE 02/18/2019 0831   HGBUR NEGATIVE 02/18/2019 0831   BILIRUBINUR NEGATIVE 02/18/2019 0831   KETONESUR NEGATIVE 02/18/2019 0831   PROTEINUR NEGATIVE 02/18/2019 0831   UROBILINOGEN 1.0 01/23/2014 0547   NITRITE NEGATIVE 02/18/2019 0831   LEUKOCYTESUR NEGATIVE 02/18/2019 0831   Sepsis Labs: @LABRCNTIP (procalcitonin:4,lacticacidven:4)  ) Recent Results (from the past 240 hour(s))  Resp Panel by RT-PCR (Flu A&B, Covid) Nasopharyngeal Swab     Status: None   Collection Time: 12/05/19  1:25 AM   Specimen: Nasopharyngeal Swab; Nasopharyngeal(NP) swabs in vial transport medium  Result Value Ref Range Status   SARS Coronavirus 2 by RT PCR NEGATIVE NEGATIVE Final    Comment: (NOTE) SARS-CoV-2 target nucleic acids are NOT DETECTED.  The SARS-CoV-2 RNA is generally detectable in upper respiratory specimens during the acute phase of infection. The lowest concentration of SARS-CoV-2 viral copies this assay can detect is 138 copies/mL. A negative result does not preclude SARS-Cov-2 infection and should not be used as the sole basis for treatment or other patient management decisions. A negative result may occur with  improper specimen collection/handling, submission of specimen other than nasopharyngeal swab, presence of viral mutation(s) within the areas targeted by this assay, and inadequate number of viral copies(<138 copies/mL). A negative result must be combined with clinical observations, patient history, and epidemiological information. The expected result is Negative.  Fact Sheet for Patients:  EntrepreneurPulse.com.au  Fact Sheet for Healthcare Providers:  IncredibleEmployment.be  This test  is no t yet approved or cleared by the [place] FDA and  has been authorized for detection and/or diagnosis of SARS-CoV-2 by FDA under an Emergency Use Authorization (EUA). This EUA will remain  in effect (meaning this test can be used) for the duration of the COVID-19 declaration under Section 564(b)(1) of the Act, 21 U.S.C.section 360bbb-3(b)(1), unless the authorization is terminated  or revoked sooner.       Influenza A by PCR NEGATIVE NEGATIVE Final   Influenza B by PCR NEGATIVE NEGATIVE Final    Comment: (NOTE) The Xpert Xpress SARS-CoV-2/FLU/RSV plus assay is intended as an aid in the diagnosis of influenza from Nasopharyngeal swab specimens and should not be used as a sole basis for treatment. Nasal washings and aspirates are unacceptable for Xpert Xpress SARS-CoV-2/FLU/RSV testing.  Fact Sheet for Patients: EntrepreneurPulse.com.au  Fact Sheet for Healthcare Providers: IncredibleEmployment.be  This test  is not yet approved or cleared by the [place] and has been authorized for detection and/or diagnosis of SARS-CoV-2 by FDA under an Emergency Use Authorization (EUA). This EUA will remain in effect (meaning this test can be used) for the duration of the COVID-19 declaration under Section 564(b)(1) of the Act, 21 U.S.C. section 360bbb-3(b)(1), unless the authorization is terminated or revoked.  Performed at [facility], [address] [place], [place] [address]   MRSA PCR Screening     Status: None   Collection Time: 12/05/19  4:45 AM   Specimen: Nasal Mucosa; Nasopharyngeal  Result Value Ref Range Status   MRSA by PCR NEGATIVE NEGATIVE Final    Comment:        The GeneXpert MRSA Assay (FDA approved for NASAL specimens only), is one component of a comprehensive MRSA colonization surveillance program. It is not intended to diagnose MRSA infection nor to guide or monitor treatment for MRSA  infections. Performed at [facility], [place] [address] [place], [place] [address]          Radiology Studies: DG Chest 2 View  Result Date: 12/08/2019 CLINICAL DATA:  Hypoxia. EXAM: CHEST - 2 VIEW COMPARISON:  November 25th 2021 FINDINGS: Cardiomediastinal silhouette is normal. Mediastinal contours appear intact. Calcific atherosclerotic disease and tortuosity of the aorta. Small left pleural effusion. Streaky peribronchial opacities in bilateral lower lobes, left greater than right. No radiographically apparent pneumothorax. Displaced left-sided rib fractures. The previously demonstrated chest wall emphysema has resolved. IMPRESSION: 1. Streaky peribronchial opacities in bilateral lower lobes, left greater than right, improved from the most recent radiograph. 2. Small left pleural effusion. 3. No radiographically apparent pneumothorax. 4. Displaced left-sided rib fractures. Electronically Signed   By: Fidela Salisbury M.D.   On: 12/08/2019 14:29        Scheduled Meds: . acetaminophen  1,000 mg Oral TID  . amLODipine  10 mg Oral QHS  . arformoterol  15 mcg Nebulization BID  . aspirin EC  81 mg Oral QHS  . [START ON 12/09/2019] azithromycin  500 mg Oral Once  . budesonide (PULMICORT) nebulizer solution  0.25 mg Nebulization BID  . Chlorhexidine Gluconate Cloth  6 each Topical Daily  . citalopram  40 mg Oral QHS  . gabapentin  300 mg Oral BID  . heparin  5,000 Units Subcutaneous Q8H  . ipratropium-albuterol  3 mL Nebulization BID  . multivitamin with minerals  1 tablet Oral QHS  . potassium chloride  20 mEq Oral Daily  . rosuvastatin  20 mg Oral Daily  . traZODone  100 mg Oral QHS   Continuous Infusions: . cefTRIAXone (ROCEPHIN)  IV 2 g (12/08/19 0905)     LOS: 3 days    Time spent: 35 minutes    Edwin Dada, MD [facility] 12/08/2019, 4:28 PM     Please page though [place] or Epic secure chat:  For password, contact charge nurse

## 2019-12-08 NOTE — Progress Notes (Signed)
Patient was found with oxygen saturation in the low 80's. Patient was on 15L HFNC, RT placed patient on 20L/90% Heated high flow nasal cannula. Patient currently tolerating well. All vitals are stable, RT will continue to monitor patient.

## 2019-12-09 ENCOUNTER — Inpatient Hospital Stay (HOSPITAL_COMMUNITY): Payer: Medicare Other

## 2019-12-09 DIAGNOSIS — J9811 Atelectasis: Secondary | ICD-10-CM

## 2019-12-09 DIAGNOSIS — J9601 Acute respiratory failure with hypoxia: Secondary | ICD-10-CM | POA: Diagnosis not present

## 2019-12-09 DIAGNOSIS — J9602 Acute respiratory failure with hypercapnia: Secondary | ICD-10-CM | POA: Diagnosis not present

## 2019-12-09 LAB — BASIC METABOLIC PANEL
Anion gap: 12 (ref 5–15)
BUN: 26 mg/dL — ABNORMAL HIGH (ref 8–23)
CO2: 29 mmol/L (ref 22–32)
Calcium: 9.1 mg/dL (ref 8.9–10.3)
Chloride: 99 mmol/L (ref 98–111)
Creatinine, Ser: 1.03 mg/dL (ref 0.61–1.24)
GFR, Estimated: >60 mL/min (ref >60)
Glucose, Bld: 225 mg/dL — ABNORMAL HIGH (ref 70–99)
Potassium: 4.1 mmol/L (ref 3.5–5.1)
Sodium: 140 mmol/L (ref 135–145)

## 2019-12-09 LAB — CBC
HCT: 46.8 % (ref 39.0–52.0)
Hemoglobin: 14.7 g/dL (ref 13.0–17.0)
MCH: 30.1 pg (ref 26.0–34.0)
MCHC: 31.4 g/dL (ref 30.0–36.0)
MCV: 95.7 fL (ref 80.0–100.0)
Platelets: 192 10*3/uL (ref 150–400)
RBC: 4.89 MIL/uL (ref 4.22–5.81)
RDW: 17.2 % — ABNORMAL HIGH (ref 11.5–15.5)
WBC: 9 10*3/uL (ref 4.0–10.5)
nRBC: 0 % (ref 0.0–0.2)

## 2019-12-09 LAB — BRAIN NATRIURETIC PEPTIDE: B Natriuretic Peptide: 93.7 pg/mL (ref 0.0–100.0)

## 2019-12-09 MED ORDER — FUROSEMIDE 40 MG PO TABS
40.0000 mg | ORAL_TABLET | Freq: Two times a day (BID) | ORAL | Status: DC
  Administered 2019-12-09 – 2019-12-10 (×3): 40 mg via ORAL
  Filled 2019-12-09 (×3): qty 1

## 2019-12-09 MED ORDER — SODIUM CHLORIDE 0.9 % IV SOLN
2.0000 g | INTRAVENOUS | Status: DC
  Administered 2019-12-10: 2 g via INTRAVENOUS
  Filled 2019-12-09: qty 20

## 2019-12-09 NOTE — Progress Notes (Signed)
RT went into pt's room and noticed pt was not on heated HFNC and was on 6L [place]. RT spoke with RN, pt suppose to be discharge tomorrow, pt to stay on 6L [place] throughout the night to see if he maintains his O2 levels.

## 2019-12-09 NOTE — Consult Note (Addendum)
NAME:  Rick Church, MRN:  [id number], DOB:  [birth year], LOS: 4 ADMISSION DATE:  12/04/2019, CONSULTATION DATE:  11/25 REFERRING MD:  Dr. Loleta Books, CHIEF COMPLAINT:  Rib pain, fall    Brief History   79 y/o M admitted 11/24 after mechanical fall at home 3 days prior to admit with left sided rib pain. Found to have rib fractures and acute hypoxic respiratory failure. Work up notable for negative CT with contrast of chest for large vessel embolism.    History of present illness   79 y/o M admitted 11/24 after mechanical fall at home 3 days prior to admit with left sided rib pain.   The patient reports he does not remember what led to his fall.  He states he really does not remember the last three days.  Notes that he fell and since has had left sided chest pain.  He lives alone and does not have children. He has a nephew who helps him out from time to time. The patient states he remains independent of all ADL's.  Notes he can walk but has to stop for breaks due to back pain.  States he does have a cough on most days and produces sputum. He is not on oxygen. Reports he was found several years ago on the street down and did not remember the circumstances surrounding that episode either. States he was told he had an irregular heart rhythm at that time.  Is on ASA QD.  On presentation, he was noted to be hypoxic on room air.  Given c/o chest and abdominal pain, a CT of the chest, abd, and pelvis was completed which showed an acute displaced 8th left rib fracture without pneumothorax.  There was subcutaneous gas along the left flank, compression fracture of L1 vertebral body, trace to small bilateral pleural effusions, cardiomegaly and CAD.    Found to have rib fractures and acute hypoxic respiratory failure. Work up notable for negative CT with contrast of chest for large vessel embolism.    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  There was subcutaneous gas along the left flank, compression fracture of L1 vertebral body, trace to small bilateral pleural effusions, cardiomegaly and CAD.    TTE 12/05/19 EF 65-70%, grade I DD, severe biatrial dilation  Micro Data:  COVID 11/24 >> negative  Influenza 11/24 >> negative   Antimicrobials:  Ceftriaxone 11/25 >>  Azithro 11/25 >>   Interim history/subjective:  Continues to have left-sided rib pain. Breathless when speaking for long periods of time. On high flow nasal cannula.  Objective   Blood pressure 117/69, pulse 62, temperature 98.6 F (37 C), temperature source Oral, resp. rate 18, height 5\' 9"  (1.753 m), weight 75.5 kg, SpO2 91 %.    FiO2 (%):  [60 %-90 %] 60 %   Intake/Output Summary (Last 24 hours) at 12/09/2019 1201 Last data filed at 12/09/2019 0600 Gross per 24 hour  Intake 580 ml  Output 1500 ml  Net -  920 ml   Filed Weights   12/07/19 0500 12/08/19 0323 12/09/19 0500  Weight: 78 kg 76.1 kg 75.5 kg   Physical Exam: General: Elderly, no acute distress, pain with deep breaths HENT: [place], AT, OP clear, MMM Eyes: EOMI, no scleral icterus Respiratory: Diminished breath sounds. Few scattered rhonchi Cardiovascular: RRR, -M/R/G, no JVD Extremities:-Edema,-tenderness Neuro: AAO x4, CNII-XII grossly intact  Resolved Hospital Problem list      Assessment & Plan:   Acute Hypoxic Respiratory Failure secondary to COPD exacerbation, atelectasis, left rib fracture, possible CAP Multifactorial in the setting of significant underlying emphysema on CT (essentially only his lingula is  spared), 2016 spirometry showed FEV1/FVC of 35% predicted, recent fall with displaced rib fracture with splinting and resultant atelectasis / possible CAP. No pneumothorax. Additionally, he does have an enlarged pulmonary artery on CT which given his history, likely supports undiagnosed pulmonary hypertension.  He may have baseline hypoxia given his longstanding tobacco abuse and occupational exposures. Mild elevation of BNP and small effusions bilaterally.   11/29: Appear euvolemic and net negative 5L since admission. Has been compliant with flutter valve and IS. Continues to limit deep breaths due to pain. Currently requires 6L [place] at rest with appropriate saturations from 88-92%.  Recommendations:  --While inpatient, wean O2 for goal SpO2 88-92% --Patient will need supplemental O2 at discharge. Perform ambulatory O2 to determine home O2 needs.  --Would continue scheduled nebulizers: Brovana and Duonebs. Please have case management arrange for home nebulizer --Continue pulmonary toilet: IS, flutter valve and OOB as tolerated --Please continue to optimize pain control. Can cautiously use narcotics --Complete five day course of antibiotics for CAP coverage --Diuresis as needed. Agree with transition to PO. --PCCM follow-up indicated on discharge. Clinic info and number has been added for patient to call and schedule follow-up with me (Dr. Loanne Drilling).  Pulmonary will continue to follow  Care Time: 35 min Rodman Pickle, M.D. [facility] Pulmonary/Critical Care Medicine 12/09/2019 2:10 PM    Best practice (evaluated daily)   Diet: as tolerated  Pain/Anxiety/Delirium protocol (if indicated): n/a VAP protocol (if indicated): n/a DVT prophylaxis: per primary  GI prophylaxis: n/a  Glucose control: per primary  Mobility: as tolerated / OOB  Last date of multidisciplinary goals of care discussion: per primary  Family and staff present n/a Summary of discussion n/a Follow up goals of care discussion  due: n/a  Code Status: Full Code Disposition: SDU  Labs   CBC: Recent Labs  Lab 12/04/19 2345 12/06/19 0017 12/07/19 0059 12/09/19 0045  WBC 13.7* 13.2* 11.0* 9.0  NEUTROABS 10.1* 11.0*  --   --   HGB 14.1 13.5 14.4 14.7  HCT 44.2 42.5 46.1 46.8  MCV 96.5 97.0 96.0 95.7  PLT 224 193 174 681    Basic Metabolic Panel: Recent Labs  Lab 12/04/19 2345 12/06/19 0017 12/07/19 0059 12/08/19 0018 12/09/19 0045  NA 137 141 144 142 140  K 4.6 3.7 3.8 4.1 4.1  CL 103 100 98 101 99  CO2 25 31 33* 31 29  GLUCOSE 89 113* 101* 138* 225*  BUN 38* 29* 27* 29* 26*  CREATININE 1.47* 1.22 1.19 1.01 1.03  CALCIUM 8.7* 8.3* 8.5* 8.6* 9.1  MG  --  1.9  --   --   --    GFR: Estimated Creatinine Clearance: 60.1 mL/min (by C-G formula based on SCr of 1.03 mg/dL). Recent Labs  Lab 12/04/19 2345 12/06/19 0017 12/07/19 0059 12/09/19 0045  WBC 13.7* 13.2* 11.0* 9.0  Liver Function Tests: Recent Labs  Lab 12/06/19 0017  AST 51*  ALT 87*  ALKPHOS 90  BILITOT 0.8  PROT 5.6*  ALBUMIN 2.9*   No results for input(s): LIPASE, AMYLASE in the last 168 hours. No results for input(s): AMMONIA in the last 168 hours.  ABG    Component Value Date/Time   PHART 7.421 12/05/2019 0530   PCO2ART 42.5 12/05/2019 0530   PO2ART 56.3 (L) 12/05/2019 0530   HCO3 27.2 12/05/2019 0530   TCO2 28 08/21/2019 0147   ACIDBASEDEF 1.0 11/18/2013 0851   O2SAT 88.3 12/05/2019 0530     Coagulation Profile: No results for input(s): INR, PROTIME in the last 168 hours.  Cardiac Enzymes: No results for input(s): CKTOTAL, CKMB, CKMBINDEX, TROPONINI in the last 168 hours.  HbA1C: Hgb A1c MFr Bld  Date/Time Value Ref Range Status  10/06/2015 05:16 AM 5.5 4.8 - 5.6 % Final    Comment:    (NOTE)         Pre-diabetes: 5.7 - 6.4         Diabetes: >6.4         Glycemic control for adults with diabetes: <7.0   11/25/2014 08:41 AM 6.2 (H) 4.8 - 5.6 % Final    Comment:             Pre-diabetes: 5.7 -  6.4          Diabetes: >6.4          Glycemic control for adults with diabetes: <7.0     CBG: No results for input(s): GLUCAP in the last 168 hours.

## 2019-12-09 NOTE — Plan of Care (Signed)

## 2019-12-09 NOTE — TOC Progression Note (Signed)
Transition of Care [facility]) - Progression Note    Patient Details  Name: Rick Church MRN: [id number] Date of Birth: 1940-03-06  [facility] CM/SW Contact  Zenon Mayo, RN Phone Number: 12/09/2019, 4:49 PM  Clinical Narrative:    NCM received  Call from Dr. Loleta Books stating patient will need home oxygen tomorrow at dc.  NCM made Zach with [employer] aware.          Expected Discharge Plan and Services                                                 Social Determinants of Health (SDOH) Interventions    Readmission Risk Interventions No flowsheet data found.

## 2019-12-09 NOTE — Progress Notes (Signed)
PLaced on 100 % NRM while in transport for Chest- x-ray with wheel chair. Sat- 90% .

## 2019-12-09 NOTE — Progress Notes (Signed)
PT Cancellation Note  Patient Details Name: Rick Church MRN: [id number] DOB: Jun 08, 1940   Cancelled Treatment:    Reason Eval/Treat Not Completed: (P) Other (comment) (RN deferred PTA to see patient as he is agitated this am and previously threatening to leave AMA.  Will f/u per POC.)   Aidaly Cordner Eli Hose 12/09/2019, 5:13 PM  Jamesmichael Shadd R. , PTA Acute Rehabilitation Services Pager [phone] Office [phone]

## 2019-12-09 NOTE — Progress Notes (Signed)
Became verbally abusive, wants to go home and claimed that the MD told him that he is going home today,.explained that there is  no order for d/c home  since his breathing is not  good, but started yelling. Refused to have v/s taken, took out cardiac monitor and  Oxygen. Pulse ox - 79. Md made aware and came to see pt and have a long talk with him.

## 2019-12-09 NOTE — Progress Notes (Signed)
Occupational Therapy Treatment Patient Details Name: Rick Church MRN: [id number] DOB: 08/13/1940 Today's Date: 12/09/2019    History of present illness 79 y/o M admitted 11/24 after mechanical fall at home 3 days prior to admit with left sided rib pain. Found to have rib fractures and acute hypoxic respiratory failure. Work up notable for negative CT with contrast of chest for large vessel embolism per chart.   OT comments  Patient continues to make gradual progress towards goals in skilled OT session. Patient's session limited due to 20L headed HFNC, therefore completed education with regard to energy conservation (verbal acknowledgement noted), sit<>stands, and marching in place. When in standing and completing activity VSS and above 92%. Discharge remains appropriate; therapy will continue to follow.    Follow Up Recommendations  Home health OT;Supervision - Intermittent    Equipment Recommendations  None recommended by OT    Recommendations for Other Services      Precautions / Restrictions Precautions Precautions: Fall Precaution Comments: rib fractures L side. Restrictions Weight Bearing Restrictions: No       Mobility Bed Mobility               General bed mobility comments: sitting EOB upon arrival  Transfers Overall transfer level: Needs assistance Equipment used: None Transfers: Sit to/from Stand Sit to Stand: Min guard;Supervision         General transfer comment: Limited to sit<>stands due to heated HFNC    Balance                                           ADL either performed or assessed with clinical judgement   ADL Overall ADL's : Needs assistance/impaired                                     Functional mobility during ADLs: Min guard General ADL Comments: guarded due to L rib pain, but able to complete sit<>stands without assist     Vision       Perception     Praxis      Cognition  Arousal/Alertness: Awake/alert Behavior During Therapy: WFL for tasks assessed/performed Overall Cognitive Status: Within Functional Limits for tasks assessed                                 General Comments: Still not sure of details surrounding fall.        Exercises     Shoulder Instructions       General Comments      Pertinent Vitals/ Pain       Pain Assessment: Faces Faces Pain Scale: Hurts even more Pain Location: L ribs with movement Pain Descriptors / Indicators: Grimacing;Guarding Pain Intervention(s): Limited activity within patient's tolerance;Monitored during session;Repositioned  Home Living                                          Prior Functioning/Environment              Frequency  Min 2X/week        Progress Toward Goals  OT Goals(current goals can now be found in the care plan  section)  Progress towards OT goals: Progressing toward goals  Acute Rehab OT Goals Patient Stated Goal: Hoping to can go home with [facility] if needed. OT Goal Formulation: With patient Time For Goal Achievement: 12/20/19  Plan Discharge plan remains appropriate    Co-evaluation                 AM-PAC OT "6 Clicks" Daily Activity     Outcome Measure   Help from another person eating meals?: None Help from another person taking care of personal grooming?: A Little Help from another person toileting, which includes using toliet, bedpan, or urinal?: A Little Help from another person bathing (including washing, rinsing, drying)?: A Little Help from another person to put on and taking off regular upper body clothing?: A Little Help from another person to put on and taking off regular lower body clothing?: A Little 6 Click Score: 19    End of Session Equipment Utilized During Treatment: Oxygen  OT Visit Diagnosis: Unsteadiness on feet (R26.81);Other abnormalities of gait and mobility (R26.89);History of falling (Z91.81);Pain    Activity Tolerance Patient tolerated treatment well;Other (comment) (Limited due to heated HFNC)   Patient Left in bed;with call bell/phone within reach (sitting EOB)   Nurse Communication Mobility status        Time: 9480-1655 OT Time Calculation (min): 17 min  Charges: OT General Charges $OT Visit: 1 Visit OT Treatments $Therapeutic Activity: 8-22 mins  [place]. Tacari Repass, COTA/L Acute Rehabilitation Services [phone] [place] 12/09/2019, 2:23 PM

## 2019-12-09 NOTE — Progress Notes (Signed)
PROGRESS NOTE    ELSTER CORBELLO  CVE:[id number] DOB: 12-14-1940 DOA: 12/04/2019 PCP: Aura Dials, MD      Brief Narrative:  Mr. Diebel is a 79 y.o. 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  Patient confused and unable to participate in history taking.  Evidently a family member went to check on him because he had not heard from him in a few days, and found his home in disarray and to the ER.  In the ER, SPO2 initially 54% on room air, placed on Ventimask.  WBC 13 K, mild AKI, BNP 596, chest x-ray clear without pneumothorax.  Covid negative, flu negative.  CT C/A/P showed emphysema, LEFT 8th rib fracture, no pneumothorax bbut overlying subcutaneous emphysema, and AAA.         Assessment & Plan:  Acute respiratory failure with hypoxia Acute diastolic CHF Multifactorial, appears to be some degree of atelectasis, fluid, or infection in the lower lobes bilaterally causing shunting, superimposed severe centrilobular emphysema, worsened by his rib fractures.  CXR initially normal.  CT chest with contrast ruled out large PE, and showed no significant airspace disease, but did show severe emphysema, chronic, and subsequently patient's O2 requirements increased dramatically  Repeat chest x-ray showed advancing bilateral infiltrates, still no pneumothorax.  Patient started on antibiotics, diuretics, bronchodilators, pain control, and pulmonology were consulted.  Echocardiogram shows grade 1 diastolic dysfunction, mild to moderate AS  -800 yesterday again, creatinine stable -Continue Lasix, transition to oral -Continue strict ins and outs -Continue ceftriaxone, day 5 of 7, azithromycin day 5 of 5 -Continue prednisone  -Continue aggressive bronchodilators -Continue mobilization,  pain control, chest PT   -Consult CCM, appreciate cares   Acute metabolic encephalopathy Due to severe hypoxia, resolved  Displaced left eighth rib fracture This seemed to have been improving.  Ptiaent with severe pain last night, and worsening o2 requirement. -Continue home oral Dilaudid -Continue scheduled Tylenol -Aggressive pulmonary toilet -Repeat CXR  COPD with severe emphysema, probable acute exacerbation History smoking FEV1 35% several years ago not on oxygen at home Still on 6L O2 to keep o2 88-94 -Continue LABA, LAMA -Continue prednisone  -Continue frequent bronchodilators  Coronary and vascular disease secondary prevention Hypertension BP controlled -Continue  amlodipine, Crestor, aspirin -Hold ARB  Depression neuropathy -Continue citalopram -Continue gabapentin  AAA -Outpatient follow up  AKI Baseline Cr 1.1, here up to 1.47, resolved with diuresis. -Hold ARB        Disposition: Status is: Inpatient  Remains inpatient appropriate because:IV treatments appropriate due to intensity of illness or inability to take PO   Dispo: The patient is from: Home              Anticipated d/c is to: SNF              Anticipated d/c date is: > 3 days              Patient currently is not medically stable to d/c.    The patient was admitted with severe acute hypoxic respiratory failure, he has had some improvement, but is still requiring between 6 and 20 L of oxygen to keep his oxygen saturation between 80 and 94%.  We will wean back to 6 L this morning, because he seems stable on this, and ambulate in the hall.  MDM: The below labs and imaging reports reviewed and summarized above.  Medication management as above.     DVT prophylaxis: heparin injection 5,000 Units Start: 12/05/19 0615 SCDs Start: 12/05/19 0521  Code Status: FULL Family Communication: Niece by phone last night    Consultants:   CCM  Procedures:   11/25  echocardiogram-EF normal, grade 1 diastolic dysfunction, moderate AS  Antimicrobials:    azithromycin 11/25 >> 11/29  Ctx 11/25>>  Culture data:   None           Subjective: A lot of pain from his ribs last night, for time, this is better.  No new fever, swelling, vomiting, confusion.         Objective: Vitals:   12/09/19 0429 12/09/19 0500 12/09/19 0722 12/09/19 0816  BP: (!) 122/53   121/84  Pulse: 60   65  Resp: 13   18  Temp: 98.7 F (37.1 C)   98.8 F (37.1 C)  TempSrc: Oral   Oral  SpO2: 97%  95% 90%  Weight:  75.5 kg    Height:        Intake/Output Summary (Last 24 hours) at 12/09/2019 1117 Last data filed at 12/09/2019 0600 Gross per 24 hour  Intake 580 ml  Output 1500 ml  Net -920 ml   Filed Weights   12/07/19 0500 12/08/19 0323 12/09/19 0500  Weight: 78 kg 76.1 kg 75.5 kg    Examination: General appearance: Elderly adult male, lying in bed, interactive, no acute distress     HEENT: Pupils equal round and reactive to light, oropharynx moist, no oral lesions, hearing normal, nasal cannula in place. Skin: Plethoric Cardiac: Regular rate and rhythm, slightly slow, systolic murmur noted, no lower extremity edema, JVP normal Respiratory: Rales resolved, no wheezing.  Lung sounds normal Abdomen: Abdomen soft no tenderness palpation, no guarding, no ascites or distention MSK:  Neuro: Extraocular movements intact, moves all extremities with normal strength and coordination, speech fluent Psych: Normal, affect normal, judgment normal      Data Reviewed: I have personally reviewed following labs and imaging studies:  CBC: Recent Labs  Lab 12/04/19 2345 12/06/19 0017 12/07/19 0059 12/09/19 0045  WBC 13.7* 13.2* 11.0* 9.0  NEUTROABS 10.1* 11.0*  --   --   HGB 14.1 13.5 14.4 14.7  HCT 44.2 42.5 46.1 46.8  MCV 96.5 97.0 96.0 95.7  PLT 224 193 174 737   Basic Metabolic Panel: Recent Labs  Lab 12/04/19 2345 12/06/19 0017  12/07/19 0059 12/08/19 0018 12/09/19 0045  NA 137 141 144 142 140  K 4.6 3.7 3.8 4.1 4.1  CL 103 100 98 101 99  CO2 25 31 33* 31 29  GLUCOSE 89 113* 101* 138* 225*  BUN 38* 29* 27* 29* 26*  CREATININE 1.47* 1.22 1.19 1.01 1.03  CALCIUM 8.7* 8.3* 8.5* 8.6* 9.1  MG  --  1.9  --   --   --    GFR: Estimated Creatinine Clearance: 60.1 mL/min (by C-G formula based on SCr of 1.03 mg/dL). Liver Function Tests: Recent Labs  Lab 12/06/19 0017  AST 51*  ALT 87*  ALKPHOS 90  BILITOT 0.8  PROT 5.6*  ALBUMIN 2.9*   No results for input(s): LIPASE, AMYLASE in the last 168 hours. No results for input(s): AMMONIA in the last 168 hours. Coagulation Profile: No results for input(s): INR, PROTIME in the last 168 hours. Cardiac Enzymes: No results for input(s): CKTOTAL, CKMB, CKMBINDEX, TROPONINI in the last 168 hours.  BNP (last 3 results) No results for input(s): PROBNP in the last 8760 hours. HbA1C: No results for input(s): HGBA1C in the last 72 hours. CBG: No results for input(s): GLUCAP in the last 168 hours. Lipid Profile: No results for input(s): CHOL, HDL, LDLCALC, TRIG, CHOLHDL, LDLDIRECT in the last 72 hours. Thyroid Function Tests: No results for input(s): TSH, T4TOTAL, FREET4, T3FREE, THYROIDAB in the last 72 hours. Anemia Panel: No results for input(s): VITAMINB12, FOLATE, FERRITIN, TIBC, IRON, RETICCTPCT in the last 72 hours. Urine analysis:    Component Value Date/Time   COLORURINE YELLOW 02/18/2019 0831   APPEARANCEUR CLEAR 02/18/2019 0831   LABSPEC 1.011 02/18/2019 0831   PHURINE 6.0 02/18/2019 0831   GLUCOSEU NEGATIVE 02/18/2019 0831   HGBUR NEGATIVE 02/18/2019 0831   BILIRUBINUR NEGATIVE 02/18/2019 0831   KETONESUR NEGATIVE 02/18/2019 0831   PROTEINUR NEGATIVE 02/18/2019 0831   UROBILINOGEN 1.0 01/23/2014 0547   NITRITE NEGATIVE 02/18/2019 0831   LEUKOCYTESUR NEGATIVE 02/18/2019 0831   Sepsis Labs: @LABRCNTIP (procalcitonin:4,lacticacidven:4)  ) Recent  Results (from the past 240 hour(s))  Resp Panel by RT-PCR (Flu A&B, Covid) Nasopharyngeal Swab     Status: None   Collection Time: 12/05/19  1:25 AM   Specimen: Nasopharyngeal Swab; Nasopharyngeal(NP) swabs in vial transport medium  Result Value Ref Range Status   SARS Coronavirus 2 by RT PCR NEGATIVE NEGATIVE Final    Comment: (NOTE) SARS-CoV-2 target nucleic acids are NOT DETECTED.  The SARS-CoV-2 RNA is generally detectable in upper respiratory specimens during the acute phase of infection. The lowest concentration of SARS-CoV-2 viral copies this assay can detect is 138 copies/mL. A negative result does not preclude SARS-Cov-2 infection and should not be used as the sole basis for treatment or other patient management decisions. A negative result may occur with  improper specimen collection/handling, submission of specimen other than nasopharyngeal swab, presence of viral mutation(s) within the areas targeted by this assay, and inadequate number of viral copies(<138 copies/mL). A negative result must be combined with clinical observations, patient history, and epidemiological information. The expected result is Negative.  Fact Sheet for Patients:  EntrepreneurPulse.com.au  Fact Sheet for Healthcare Providers:  IncredibleEmployment.be  This test is no t yet approved or cleared by the [place] FDA and  has been authorized for detection and/or diagnosis of SARS-CoV-2 by FDA under an Emergency Use Authorization (EUA). This EUA will remain  in effect (meaning this test can be used) for the duration of the COVID-19 declaration under Section 564(b)(1) of the Act, 21 U.S.C.section 360bbb-3(b)(1), unless the authorization is terminated  or revoked sooner.       Influenza A by PCR NEGATIVE NEGATIVE Final   Influenza B by PCR NEGATIVE NEGATIVE Final    Comment: (NOTE) The Xpert Xpress SARS-CoV-2/FLU/RSV plus assay is intended as an aid in the  diagnosis of influenza from Nasopharyngeal swab specimens and should not be used as a sole basis for treatment. Nasal washings and aspirates are unacceptable for Xpert Xpress SARS-CoV-2/FLU/RSV testing.  Fact Sheet for Patients: EntrepreneurPulse.com.au  Fact Sheet for Healthcare Providers: IncredibleEmployment.be  This test is not yet approved or cleared by the [place] FDA and has been authorized for detection and/or diagnosis of SARS-CoV-2 by FDA under an Emergency Use Authorization (EUA). This EUA will remain in effect (meaning this test can be used) for the duration of the COVID-19 declaration under Section 564(b)(1) of the Act, 21 U.S.C. section 360bbb-3(b)(1), unless the authorization is terminated or revoked.  Performed at [facility],  [place], [place] [address]   MRSA PCR Screening     Status: None   Collection Time: 12/05/19  4:45 AM   Specimen: Nasal Mucosa; Nasopharyngeal  Result Value Ref Range Status   MRSA by PCR NEGATIVE NEGATIVE Final    Comment:        The GeneXpert MRSA Assay (FDA approved for NASAL specimens only), is one component of a comprehensive MRSA colonization surveillance program. It is not intended to diagnose MRSA infection nor to guide or monitor treatment for MRSA infections. Performed at [facility], [place] [address] [place], [place] [address]          Radiology Studies: DG Chest 2 View  Result Date: 12/08/2019 CLINICAL DATA:  Hypoxia. EXAM: CHEST - 2 VIEW COMPARISON:  November 25th 2021 FINDINGS: Cardiomediastinal silhouette is normal. Mediastinal contours appear intact. Calcific atherosclerotic disease and tortuosity of the aorta. Small left pleural effusion. Streaky peribronchial opacities in bilateral lower lobes, left greater than right. No radiographically apparent pneumothorax. Displaced left-sided rib fractures. The previously demonstrated chest wall  emphysema has resolved. IMPRESSION: 1. Streaky peribronchial opacities in bilateral lower lobes, left greater than right, improved from the most recent radiograph. 2. Small left pleural effusion. 3. No radiographically apparent pneumothorax. 4. Displaced left-sided rib fractures. Electronically Signed   By: Fidela Salisbury M.D.   On: 12/08/2019 14:29        Scheduled Meds: . acetaminophen  1,000 mg Oral TID  . amLODipine  10 mg Oral QHS  . arformoterol  15 mcg Nebulization BID  . aspirin EC  81 mg Oral QHS  . Chlorhexidine Gluconate Cloth  6 each Topical Daily  . citalopram  40 mg Oral QHS  . gabapentin  300 mg Oral BID  . heparin  5,000 Units Subcutaneous Q8H  . ipratropium-albuterol  3 mL Nebulization TID  . multivitamin with minerals  1 tablet Oral QHS  . potassium chloride  20 mEq Oral Daily  . predniSONE  40 mg Oral Q breakfast  . rosuvastatin  20 mg Oral Daily  . traZODone  100 mg Oral QHS   Continuous Infusions:    LOS: 4 days    Time spent: 35 minutes    Edwin Dada, MD [facility] 12/09/2019, 11:17 AM     Please page though [place] or Epic secure chat:  For password, contact charge nurse

## 2019-12-09 NOTE — Progress Notes (Signed)
As per MD keep pt on O2 6L which he requires  when he goes home.

## 2019-12-09 NOTE — Progress Notes (Signed)
Desats to 85% 0n 6L HFNC. Placed back on heated HFNC 20 L sat- 89% MD aware. Continue to monitor.

## 2019-12-10 DIAGNOSIS — J449 Chronic obstructive pulmonary disease, unspecified: Secondary | ICD-10-CM

## 2019-12-10 DIAGNOSIS — J9601 Acute respiratory failure with hypoxia: Secondary | ICD-10-CM | POA: Diagnosis not present

## 2019-12-10 DIAGNOSIS — R0902 Hypoxemia: Secondary | ICD-10-CM | POA: Diagnosis not present

## 2019-12-10 MED ORDER — CEFDINIR 300 MG PO CAPS: 300.0000 mg | ORAL_CAPSULE | Freq: Two times a day (BID) | ORAL | 0 refills | Status: AC

## 2019-12-10 MED ORDER — IPRATROPIUM-ALBUTEROL 0.5-2.5 (3) MG/3ML IN SOLN: 3.0000 mL | Freq: Three times a day (TID) | RESPIRATORY_TRACT | 2 refills | Status: AC

## 2019-12-10 MED ORDER — PREDNISONE 10 MG PO TABS: ORAL_TABLET | ORAL | 0 refills | Status: AC

## 2019-12-10 MED ORDER — FUROSEMIDE 40 MG PO TABS: 40.0000 mg | ORAL_TABLET | Freq: Every day | ORAL | 11 refills | Status: AC

## 2019-12-10 MED ORDER — HYDROMORPHONE HCL 2 MG PO TABS
3.0000 mg | ORAL_TABLET | ORAL | Status: DC | PRN
  Administered 2019-12-10: 3 mg via ORAL
  Filled 2019-12-10: qty 2

## 2019-12-10 MED ORDER — FLUTICASONE FUROATE-VILANTEROL 100-25 MCG/INH IN AEPB: 1.0000 | INHALATION_SPRAY | Freq: Every day | RESPIRATORY_TRACT | 2 refills | Status: AC

## 2019-12-10 NOTE — Progress Notes (Signed)
Discharged home accompanied by nephew , discharge instructions given to pt. Belongings taken home.

## 2019-12-10 NOTE — Progress Notes (Signed)
[facility] ([facility])  Hospital Liaison RN note         Notified by TOC manager of patient/family request for [facility] Palliative services at home after discharge.       [facility] Palliative team will follow up with patient after discharge.         Please call with any hospice or palliative related questions.         Thank you for the opportunity to participate in this patient's care.     Chrislyn King, BSN, RN [facility] Hospital Liaison (listed on [facility] under Hospice/[facility])    [phone]     

## 2019-12-10 NOTE — Progress Notes (Signed)
Discharge instructions given to pt.

## 2019-12-10 NOTE — Consult Note (Signed)
NAME:  Rick Church, MRN:  [id number], DOB:  February 18, 1940, LOS: 5 ADMISSION DATE:  12/04/2019, CONSULTATION DATE:  11/25 REFERRING MD:  Dr. Loleta Books, CHIEF COMPLAINT:  Rib pain, fall    Brief History   79 y/o M admitted 11/24 after mechanical fall at home 3 days prior to admit with left sided rib pain. Found to have rib fractures and acute hypoxic respiratory failure. Work up notable for negative CT with contrast of chest for large vessel embolism.    History of present illness   79 y/o M admitted 11/24 after mechanical fall at home 3 days prior to admit with left sided rib pain.   The patient reports he does not remember what led to his fall.  He states he really does not remember the last three days.  Notes that he fell and since has had left sided chest pain.  He lives alone and does not have children. He has a nephew who helps him out from time to time. The patient states he remains independent of all ADL's.  Notes he can walk but has to stop for breaks due to back pain.  States he does have a cough on most days and produces sputum. He is not on oxygen. Reports he was found several years ago on the street down and did not remember the circumstances surrounding that episode either. States he was told he had an irregular heart rhythm at that time.  Is on ASA QD.  On presentation, he was noted to be hypoxic on room air.  Given c/o chest and abdominal pain, a CT of the chest, abd, and pelvis was completed which showed an acute displaced 8th left rib fracture without pneumothorax.  There was subcutaneous gas along the left flank, compression fracture of L1 vertebral body, trace to small bilateral pleural effusions, cardiomegaly and CAD.    Found to have rib fractures and acute hypoxic respiratory failure. Work up notable for negative CT with contrast of chest for large vessel embolism.    Past Medical History  Tobacco Abuse - began smoking age 72, 1ppd Legionnaire's Disease  PNA - 2015 COPD  ARDS   HTN HLD NSTEMI - non-obs CAD on LHC 2015 CVA - no residual  Mild AS - on ECHO 2016  GERD  Hiatal Hernia  Chronic Back Pain  Significant Hospital Events   11/24 Admit   Consults:  12/05/2019 pulmonary critical care  Procedures:    Significant Diagnostic Tests:  CT Chest / ABD / Pelvis 11/25 >> showed an acute displaced 8th left rib fracture without pneumothorax.  There was subcutaneous gas along the left flank, compression fracture of L1 vertebral body, trace to small bilateral pleural effusions, cardiomegaly and CAD.    TTE 12/05/19 EF 65-70%, grade I DD, severe biatrial dilation  Micro Data:  COVID 11/24 >> negative  Influenza 11/24 >> negative   Antimicrobials:  Ceftriaxone 11/25 >>  Azithro 11/25 >>   Interim history/subjective:  Feeling depressed about his condition. His rib pain still is not under control.  Objective   Blood pressure 131/72, pulse (!) 58, temperature 97.7 F (36.5 C), temperature source Oral, resp. rate 15, height 5\' 9"  (1.753 m), weight 75.7 kg, SpO2 91 %.    FiO2 (%):  [60 %] 60 %   Intake/Output Summary (Last 24 hours) at 12/10/2019 1040 Last data filed at 12/10/2019 0727 Gross per 24 hour  Intake 440 ml  Output --  Net 440 ml   [employer]  12/08/19 0323 12/09/19 0500 12/10/19 0429  Weight: 76.1 kg 75.5 kg 75.7 kg   Physical Exam: General: Elderly, no acute distress, pain with deep breaths HENT: [name], AT, OP clear, MMM Eyes: EOMI, no scleral icterus Respiratory: Diminished breath sounds. Few scattered rhonchi Cardiovascular: RRR, -M/R/G, no JVD Extremities:-Edema,-tenderness Neuro: AAO x4, CNII-XII grossly intact  Resolved Hospital Problem list      Assessment & Plan:   Acute Hypoxic Respiratory Failure secondary to COPD exacerbation, atelectasis, left rib fracture, possible CAP Multifactorial in the setting of significant underlying emphysema on CT (essentially only his lingula is spared), 2016 spirometry showed FEV1of  36% predicted, recent fall with displaced rib fracture with splinting and resultant atelectasis / possible CAP. No pneumothorax. Additionally, he does have an enlarged pulmonary artery on CT which given his history, likely supports undiagnosed pulmonary hypertension.  He may have baseline hypoxia given his longstanding tobacco abuse and occupational exposures. Mild elevation of BNP and small effusions bilaterally.   11/29: Appear euvolemic and net negative 5L since admission. Has been compliant with flutter valve and IS. Continues to limit deep breaths due to pain. Currently requires 6L [place] at rest with appropriate saturations from 88-92%.  11/30: Unchanged status. He will probably have some improvement in oxygen requirement once his rib pain is under control however his baseline respiratory status is oxygen dependent at this point with his end-stage COPD. Would be appropriate to slowly titrate up his pain regimen to address both his pain and respiratory status. Requires at least 6-7L at rest for adequate saturations. Will work on arranging clinic follow-up prior to discharge.   Recommendations:  --While inpatient, wean O2 for goal SpO2 88-92% --Patient will need supplemental O2 at discharge. Perform ambulatory O2 to determine home O2 needs.  --Continue scheduled nebulizers as an outpatient: Brovana BID and Duonebs TID. Please have case management arrange for home nebulizer --Continue pulmonary toilet: IS, flutter valve and OOB as tolerated --Pain control and respiratory status: Increase dilaudid to 3mg  q3h --Complete five day course of antibiotics for CAP coverage --Diuresis as needed. Agree with transition to PO. --PCCM follow-up indicated on discharge. Clinic info and number has been added for patient to call and schedule follow-up with me (Dr. Loanne Drilling).  Pulmonary will continue to follow  Care Time: 25 min  Rodman Pickle, M.D. [facility] Pulmonary/Critical Care Medicine 12/10/2019 10:40 AM       Best practice (evaluated daily)   Diet: as tolerated  Pain/Anxiety/Delirium protocol (if indicated): n/a VAP protocol (if indicated): n/a DVT prophylaxis: per primary  GI prophylaxis: n/a  Glucose control: per primary  Mobility: as tolerated / OOB  Last date of multidisciplinary goals of care discussion: per primary  Family and staff present n/a Summary of discussion n/a Follow up goals of care discussion due: n/a  Code Status: Full Code Disposition: SDU  Labs   CBC: Recent Labs  Lab 12/04/19 2345 12/06/19 0017 12/07/19 0059 12/09/19 0045  WBC 13.7* 13.2* 11.0* 9.0  NEUTROABS 10.1* 11.0*  --   --   HGB 14.1 13.5 14.4 14.7  HCT 44.2 42.5 46.1 46.8  MCV 96.5 97.0 96.0 95.7  PLT 224 193 174 834    Basic Metabolic Panel: Recent Labs  Lab 12/04/19 2345 12/06/19 0017 12/07/19 0059 12/08/19 0018 12/09/19 0045  NA 137 141 144 142 140  K 4.6 3.7 3.8 4.1 4.1  CL 103 100 98 101 99  CO2 25 31 33* 31 29  GLUCOSE 89 113* 101* 138* 225*  BUN 38*  29* 27* 29* 26*  CREATININE 1.47* 1.22 1.19 1.01 1.03  CALCIUM 8.7* 8.3* 8.5* 8.6* 9.1  MG  --  1.9  --   --   --    GFR: Estimated Creatinine Clearance: 60.1 mL/min (by C-G formula based on SCr of 1.03 mg/dL). Recent Labs  Lab 12/04/19 2345 12/06/19 0017 12/07/19 0059 12/09/19 0045  WBC 13.7* 13.2* 11.0* 9.0    Liver Function Tests: Recent Labs  Lab 12/06/19 0017  AST 51*  ALT 87*  ALKPHOS 90  BILITOT 0.8  PROT 5.6*  ALBUMIN 2.9*   No results for input(s): LIPASE, AMYLASE in the last 168 hours. No results for input(s): AMMONIA in the last 168 hours.  ABG    Component Value Date/Time   PHART 7.421 12/05/2019 0530   PCO2ART 42.5 12/05/2019 0530   PO2ART 56.3 (L) 12/05/2019 0530   HCO3 27.2 12/05/2019 0530   TCO2 28 08/21/2019 0147   ACIDBASEDEF 1.0 11/18/2013 0851   O2SAT 88.3 12/05/2019 0530     Coagulation Profile: No results for input(s): INR, PROTIME in the last 168 hours.  Cardiac  Enzymes: No results for input(s): CKTOTAL, CKMB, CKMBINDEX, TROPONINI in the last 168 hours.  HbA1C: Hgb A1c MFr Bld  Date/Time Value Ref Range Status  10/06/2015 05:16 AM 5.5 4.8 - 5.6 % Final    Comment:    (NOTE)         Pre-diabetes: 5.7 - 6.4         Diabetes: >6.4         Glycemic control for adults with diabetes: <7.0   11/25/2014 08:41 AM 6.2 (H) 4.8 - 5.6 % Final    Comment:             Pre-diabetes: 5.7 - 6.4          Diabetes: >6.4          Glycemic control for adults with diabetes: <7.0     CBG: No results for input(s): GLUCAP in the last 168 hours.

## 2019-12-10 NOTE — Progress Notes (Signed)
4 oxygen tanks already delivered in pt's room.

## 2019-12-10 NOTE — Discharge Summary (Signed)
Physician Discharge Summary  Rick Church JQZ:[id number] DOB: 07/08/1940 DOA: 12/04/2019  PCP: Aura Dials, MD  Admit date: 12/04/2019 Discharge date: 12/10/2019  Admitted From: Home  Disposition:  Home with [facility]   Recommendations for Outpatient Follow-up:  1. Follow up with Pulmonology in 2 weeks 2. Follow up with [place] as soon as able 3. Drs. Donata Clay, and Eichman: Patient has [place] following; his opiate needs may escalate quickly as a result of his combined rib fracture and end stage lung disease       Home Health: PT/OT due to shortness of breath with exertion  Equipment/Devices: O2, 6L per minute  Discharge Condition: Declining  CODE STATUS: FULL   Brief/Interim Summary: Rick Church is a 79 y.o. 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  Evidently a family member went to check on him because he had not heard from him in a few days, and found his home in disarray, patient confused and cyanotic.  EMS activated, found SpO2 ~50%, placed Ventimask.     In the ER, chest x-ray clear, rib fracture noted.  Very hypoxic, poor air-movement.  BNP 596, Covid negative, flu negative.  CT C/A/P showed extensive emphysema, bibasilar opacities consistent with pneumonia or edema.           PRINCIPAL HOSPITAL DIAGNOSIS: Acute respiratory failure with hypoxia    Discharge Diagnoses:   Acute respiratory failure with hypoxia COPD exacerbation Community acquired pneumonia, bilateral lower lobes Patient admitted and started on steroids, antibiotics, diuretics, bronchodilators and aggressive analgesia for rib.     As above, imaging showed extensive and severe lung parenchymal loss, some basilar consolidation, no pneumothorax, no PE.  FEV1 35% several years ago  not on oxygen at home.  Pulmonology were consulted.  Patient had modest clinical improvement with steroids, antibiotics and diuretics.  Etiology ultimately thought to be multifactorial: decompensation of advanced COPD in setting of hypoventilation from rib fracture, compounded by acute CHF and pneumonia.    Discharged with new O2 6L, Pulmonology follow up and Palliative Care following.  Given his end stage lung disease, likely 6 months or less prognosis.  Hospice to follow.     Acute diastolic CHF EF 62% with grade I DD.  Diuresed 5 kg in 5 days, crackles on exam resolved. Continue oral Lasix  Acute metabolic encephalopathy  Displaced left eighth rib fracture Analgesia effective with home hydromorphone 2 mg TID.  May need escalation of opiate soon, if respiratory status does not improve, and transitions to Hospice.    Coronary and vascular disease secondary prevention Hypertension  AKI Baseline Cr 1.1, here up to 1.47, resolved with diuresis.          Discharge Instructions  Discharge Instructions    Discharge instructions   Complete by: As directed    You were admitted for lung failure This lung failure happened because of infection in the lungs and a rib fracture on your left side (making it hard to breath) on top of your severe lung disease at baseline.  For the infection: You were treated with several days of antibiotics here, take 5 more days for a total of 10 days Take the antibiotic cefdinir 300 mg twice daily Use the flutter valve to cough out mucus    For the rib fracture: Take your home hydromorphone 2 mg  Take acetaminophen 1000 mg three times a day (this will help the hydromorphone work better) You may also use ibuprofen 400 mg (2 tabs) once or twice per day, but don't use this more than three times per day for a week    For the underlying lung disease, the main problem: Use the oxygen all the time Take it easy for the next two weeks Don't  leave the house Don't walk longer than from one room to the next without checking your oxygen level (SpO2)  Check your oxygen level with a pulse oximeter (this is the clip for your finger)  Your oxygen on the meter (SpO2) should be 88% or above Check your SpO2 at rest (make sure it is 88% or above) and check it sometimes with walking around  Don't smoke or expose the oxygen to flames   Take prednisone according to the following taper: Take prednisone 40 mg (4 tabs) once daily for 3 days (Weds-Fri) then  Take prednisone 30 mg (3 tabs) once daily for three days (Sat-Mon) then Take prednisone 20 mg (2 tabs) once daily for three days (tues to Thursday) then  Take prednisone 10 mg (1 tab) daily  WHen you get to 10 mg, keep taking this and talk to the Palliative care nurse about continuing it, or if you are feeling better enough to go to your primary doctor, call them for an appointment and ask them if you should continue or stop it  Use Breo every day Use albuterol (either as a Duoneb, or in a puffer) three times daily for the next 2 weeks, then back off to as needed use  Talk to the palliative care nurse about transitioning to Hospice and using Morphine for pain and for your breathing and stopping hydromorphone   Increase activity slowly   Complete by: As directed      Allergies as of 12/10/2019      Reactions   Propofol Nausea And Vomiting, Other (See Comments)   Severe vomiting with anesthesia    Atorvastatin Other (See Comments)   leg myalgias   Indocin [indomethacin] Nausea Only, Other (See Comments)   Dizziness   Penicillins Hives, Other (See Comments)   Tolerated a dose of CEFEPIME 12/31/13 Did it involve swelling of the face/tongue/throat, SOB, or low BP? No Did it involve sudden or severe rash/hives, skin peeling, or any reaction on the inside of your mouth or nose? Yes Did you need to seek medical attention at a hospital or doctor's office? No When did it last  happen?1961 If all above answers are "NO", may proceed with cephalosporin use.   Buspirone Other (See Comments)   UNSPECIFIED INTOLERANCE   Restoril [temazepam] Other (See Comments)   UNSPECIFIED INTOLERANCE   Toprol Xl [metoprolol Tartrate] Other (See Comments)   UNSPECIFIED REACTION    Asa [aspirin] Itching, Rash   Codeine Nausea And Vomiting   Hytrin [terazosin] Nausea And Vomiting      Oxycodone Nausea Only, Other (See Comments)   "Deathly sick"- CAN TOLERATE DILAUDID   Zestril [lisinopril] Other (See Comments)   Makes him feel "really badly- doesn't feel like getting up in the morning"      Medication List    STOP taking these medications   irbesartan 300 MG tablet Commonly known as: AVAPRO     TAKE these medications   acetaminophen 500 MG tablet Commonly known as: TYLENOL Take 500 mg by mouth every 6 (six) hours as needed for mild pain or headache.  amLODipine 10 MG tablet Commonly known as: NORVASC Take 10 mg by mouth at bedtime.   aspirin EC 81 MG tablet Take 81 mg by mouth at bedtime.   butalbital-acetaminophen-caffeine 50-325-40 MG tablet Commonly known as: FIORICET TAKE 1 TABLET BY MOUTH EVERY 6 HOURS AS NEEDED FOR MIGRAINE What changed: See the new instructions.   Cambia 50 MG Pack Generic drug: Diclofenac Potassium(Migraine) TAKE AS NEEDED FOR SEVERE MIGRAINE What changed: See the new instructions.   cefdinir 300 MG capsule Commonly known as: OMNICEF Take 1 capsule (300 mg total) by mouth 2 (two) times daily.   citalopram 40 MG tablet Commonly known as: CELEXA Take 40 mg by mouth at bedtime.   clonazePAM 2 MG tablet Commonly known as: KLONOPIN Take 1 tablet (2 mg total) by mouth 2 (two) times daily as needed for anxiety. What changed:   when to take this  additional instructions   fluticasone furoate-vilanterol 100-25 MCG/INH Aepb Commonly known as: BREO ELLIPTA Inhale 1 puff into the lungs daily. RINSE MOUTH AFTER USING   furosemide  40 MG tablet Commonly known as: Lasix Take 1 tablet (40 mg total) by mouth daily.   gabapentin 300 MG capsule Commonly known as: NEURONTIN TAKE 1 CAPSULE BY MOUTH  TWICE DAILY   HYDROmorphone 2 MG tablet Commonly known as: DILAUDID Take 2 mg by mouth every 8 (eight) hours as needed (for pain).   ipratropium-albuterol 0.5-2.5 (3) MG/3ML Soln Commonly known as: DUONEB Take 3 mLs by nebulization 3 (three) times daily.   meclizine 25 MG tablet Commonly known as: ANTIVERT Take 50-75 mg by mouth See admin instructions. Take 75 mg by mouth in the morning and an additional 50 mg once daily as needed for vertigo (or nausea)   multivitamin with minerals Tabs tablet Take 1 tablet by mouth at bedtime.   ondansetron 4 MG disintegrating tablet Commonly known as: ZOFRAN-ODT Take 4 mg by mouth every 8 (eight) hours as needed for nausea or vomiting (DISSOLVE IN THE MOUTH).   pantoprazole 40 MG tablet Commonly known as: PROTONIX Take 1 tablet (40 mg total) by mouth daily. What changed:   when to take this  reasons to take this   predniSONE 10 MG tablet Commonly known as: DELTASONE Take prednisone 40 mg (4 tabs) daily for 3 days then take 30 mg (3 tabs) daily for 3 days then take 20 mg (2 tabs) daily for three days then take 10 mg (1 tab) daily   pregabalin 75 MG capsule Commonly known as: LYRICA TAKE 1 CAPSULE BY MOUTH THREE TIMES DAILY AS NEEDED FOR PAIN What changed: See the new instructions.   Proventil HFA 108 (90 Base) MCG/ACT inhaler Generic drug: albuterol Inhale 2 puffs into the lungs every 6 (six) hours as needed for wheezing or shortness of breath.   simvastatin 80 MG tablet Commonly known as: ZOCOR Take 40 mg by mouth at bedtime.   traZODone 100 MG tablet Commonly known as: DESYREL Take 100 mg by mouth at bedtime.            Durable Medical Equipment  (From admission, onward)         Start     Ordered   12/09/19 1648  DME Oxygen  (Discharge Planning)  Once        Question Answer Comment  Length of Need Lifetime   Mode or (Route) Nasal cannula   Liters per Minute 6   Frequency Continuous (stationary and portable oxygen unit needed)   Oxygen delivery system  Gas      12/09/19 1647          Follow-up Information    Margaretha Seeds, MD Follow up in 2 week(s).   Specialty: Pulmonary Disease Why: Please call the office to schedule appointment with me Contact information: [place] [address] [phone]        [facility], Person Follow up.   Why: HHPT , HHOT Contact information: [place] [address] [phone]        [facility] Follow up.   Why: outpatient palliative services Contact information: [place] [address] [phone]             Allergies  Allergen Reactions  . Propofol Nausea And Vomiting and Other (See Comments)    Severe vomiting with anesthesia   . Atorvastatin Other (See Comments)    leg myalgias  . Indocin [Indomethacin] Nausea Only and Other (See Comments)    Dizziness   . Penicillins Hives and Other (See Comments)    Tolerated a dose of CEFEPIME 12/31/13 Did it involve swelling of the face/tongue/throat, SOB, or low BP? No Did it involve sudden or severe rash/hives, skin peeling, or any reaction on the inside of your mouth or nose? Yes Did you need to seek medical attention at a hospital or doctor's office? No When did it last happen?1961 If all above answers are "NO", may proceed with cephalosporin use.   Marland Kitchen Buspirone Other (See Comments)    UNSPECIFIED INTOLERANCE  . Restoril [Temazepam] Other (See Comments)    UNSPECIFIED INTOLERANCE  . Toprol Xl [Metoprolol Tartrate] Other (See Comments)    UNSPECIFIED REACTION    . Asa [Aspirin] Itching and Rash  . Codeine Nausea And Vomiting  . Hytrin [Terazosin] Nausea And Vomiting       . Oxycodone Nausea Only and Other (See Comments)     "Deathly sick"- CAN TOLERATE DILAUDID  . Zestril [Lisinopril] Other (See Comments)    Makes him feel "really badly- doesn't feel like getting up in the morning"    Consultations:  Pulmonology   Procedures/Studies: DG Chest 2 View  Result Date: 12/09/2019 CLINICAL DATA:  Hypoxia. EXAM: CHEST - 2 VIEW COMPARISON:  November 18, 2019. FINDINGS: Stable cardiomediastinal silhouette. No pneumothorax is noted. Mild bibasilar subsegmental atelectasis is noted. Stable left rib fractures are noted. Small left pleural effusion may be present. IMPRESSION: Mild bibasilar subsegmental atelectasis. Small left pleural effusion may be present. Stable left rib fractures are noted. Electronically Signed   By: Marijo Conception M.D.   On: 12/09/2019 12:34   DG Chest 2 View  Result Date: 12/08/2019 CLINICAL DATA:  Hypoxia. EXAM: CHEST - 2 VIEW COMPARISON:  November 25th 2021 FINDINGS: Cardiomediastinal silhouette is normal. Mediastinal contours appear intact. Calcific atherosclerotic disease and tortuosity of the aorta. Small left pleural effusion. Streaky peribronchial opacities in bilateral lower lobes, left greater than right. No radiographically apparent pneumothorax. Displaced left-sided rib fractures. The previously demonstrated chest wall emphysema has resolved. IMPRESSION: 1. Streaky peribronchial opacities in bilateral lower lobes, left greater than right, improved from the most recent radiograph. 2. Small left pleural effusion. 3. No radiographically apparent pneumothorax. 4. Displaced left-sided rib fractures. Electronically Signed   By: Fidela Salisbury M.D.   On: 12/08/2019 14:29   CT Chest W Contrast  Result Date: 12/05/2019 CLINICAL DATA:  Acute pain due to trauma. Left-sided displaced rib fractures. EXAM: CT CHEST WITH CONTRAST CT ABDOMEN  AND PELVIS WITH CONTRAST TECHNIQUE: Multidetector CT imaging of the chest was performed using the standard protocol during bolus administration of intravenous  contrast. Multiplanar CT image reconstructions were obtained. Multidetector CT imaging of the abdomen and pelvis was performed using the standard protocol during bolus administration of intravenous contrast. CONTRAST:  122mL OMNIPAQUE IOHEXOL 300 MG/ML  SOLN COMPARISON:  CT angio of the chest dated November 14, 2013. FINDINGS: CTA CHEST FINDINGS Cardiovascular: There are atherosclerotic changes of the thoracic aorta without evidence for dissection. The arch vessels are patent where visualized. There is no large centrally located pulmonary embolism. Detection of smaller pulmonary emboli is not possible on this study due to contrast timing. The heart is enlarged. There are coronary artery calcifications. Mediastinum/Nodes: -- No mediastinal lymphadenopathy. -- No hilar lymphadenopathy. -- No axillary lymphadenopathy. -- No supraclavicular lymphadenopathy. -- Normal thyroid gland where visualized. -  Unremarkable esophagus. Lungs/Pleura: There are severe emphysematous changes bilaterally. There is no pneumothorax. Bibasilar atelectasis is noted. There are trace to small bilateral pleural effusions. There is subcutaneous gas along the patient's left flank. Musculoskeletal: There is an acute displaced fracture involving the eighth rib on the left. CT ABDOMEN and PELVIS FINDINGS Hepatobiliary: The liver is normal. Status post cholecystectomy.There is no biliary ductal dilation. Pancreas: Normal contours without ductal dilatation. No peripancreatic fluid collection. Spleen: Unremarkable. Adrenals/Urinary Tract: --Adrenal glands: Unremarkable. --Right kidney/ureter: No hydronephrosis or radiopaque kidney stones. --Left kidney/ureter: There is a punctate nonobstructing stone in the interpolar region of the left kidney. --Urinary bladder: Unremarkable. Stomach/Bowel: --Stomach/Duodenum: No hiatal hernia or other gastric abnormality. Normal duodenal course and caliber. --Small bowel: Unremarkable. --Colon: Rectosigmoid  diverticulosis without acute inflammation. --Appendix: Normal. Vascular/Lymphatic: Atherosclerotic changes are noted throughout the abdominal aorta. There is an infrarenal fusiform abdominal aortic aneurysm measuring approximately 5 x 4.6 cm. 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 The right common iliac artery is ectatic measuring 1.9 cm. --No retroperitoneal lymphadenopathy. --No mesenteric lymphadenopathy. --No pelvic or inguinal lymphadenopathy. Reproductive: Unremarkable Other: There is a small amount of free fluid in the patient's abdomen. The abdominal wall is normal. Musculoskeletal. There is an age-indeterminate compression fracture of the L1 vertebral body with approximately 20% height loss anteriorly. There is no retropulsion. Degenerative changes are noted throughout the lumbar spine. IMPRESSION: 1. Acute displaced fracture involving the eighth rib on the left. No pneumothorax. 2. Subcutaneous gas along the patient's left flank. 3. Age-indeterminate compression fracture of the L1 vertebral body with approximately 20% height loss anteriorly. There is no retropulsion. 4. Infrarenal fusiform abdominal aortic aneurysm measuring approximately 5 x 4.6 cm. Recommend follow-up every 6 months and vascular consultation. This recommendation follows ACR consensus guidelines: White Paper of the ACR Incidental Findings Committee II on Vascular Findings. J Am Coll Radiol 2013; 10:789-794. 5. 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 6. Trace to small bilateral pleural effusions. 7. Cardiomegaly and coronary artery disease. 8. Punctate nonobstructing stone in the interpolar region of the left kidney. 9. Rectosigmoid diverticulosis without acute inflammation. 10. Trace ascites. Aortic Atherosclerosis (ICD10-I70.0) and Emphysema (ICD10-J43.9). Electronically Signed   By: Constance Holster M.D.   On: 12/05/2019 01:11   CT ABDOMEN PELVIS W  CONTRAST  Result Date: 12/05/2019 CLINICAL DATA:  Acute pain due to trauma. Left-sided displaced rib fractures. EXAM: CT CHEST WITH CONTRAST CT ABDOMEN AND PELVIS WITH CONTRAST TECHNIQUE: Multidetector CT imaging of the chest was performed using the standard protocol during bolus administration of intravenous contrast. Multiplanar  CT image reconstructions were obtained. Multidetector CT imaging of the abdomen and pelvis was performed using the standard protocol during bolus administration of intravenous contrast. CONTRAST:  17mL OMNIPAQUE IOHEXOL 300 MG/ML  SOLN COMPARISON:  CT angio of the chest dated November 14, 2013. FINDINGS: CTA CHEST FINDINGS Cardiovascular: There are atherosclerotic changes of the thoracic aorta without evidence for dissection. The arch vessels are patent where visualized. There is no large centrally located pulmonary embolism. Detection of smaller pulmonary emboli is not possible on this study due to contrast timing. The heart is enlarged. There are coronary artery calcifications. Mediastinum/Nodes: -- No mediastinal lymphadenopathy. -- No hilar lymphadenopathy. -- No axillary lymphadenopathy. -- No supraclavicular lymphadenopathy. -- Normal thyroid gland where visualized. -  Unremarkable esophagus. Lungs/Pleura: There are severe emphysematous changes bilaterally. There is no pneumothorax. Bibasilar atelectasis is noted. There are trace to small bilateral pleural effusions. There is subcutaneous gas along the patient's left flank. Musculoskeletal: There is an acute displaced fracture involving the eighth rib on the left. CT ABDOMEN and PELVIS FINDINGS Hepatobiliary: The liver is normal. Status post cholecystectomy.There is no biliary ductal dilation. Pancreas: Normal contours without ductal dilatation. No peripancreatic fluid collection. Spleen: Unremarkable. Adrenals/Urinary Tract: --Adrenal glands: Unremarkable. --Right kidney/ureter: No hydronephrosis or radiopaque kidney stones.  --Left kidney/ureter: There is a punctate nonobstructing stone in the interpolar region of the left kidney. --Urinary bladder: Unremarkable. Stomach/Bowel: --Stomach/Duodenum: No hiatal hernia or other gastric abnormality. Normal duodenal course and caliber. --Small bowel: Unremarkable. --Colon: Rectosigmoid diverticulosis without acute inflammation. --Appendix: Normal. Vascular/Lymphatic: Atherosclerotic changes are noted throughout the abdominal aorta. There is an infrarenal fusiform abdominal aortic aneurysm measuring approximately 5 x 4.6 cm. 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 The right common iliac artery is ectatic measuring 1.9 cm. --No retroperitoneal lymphadenopathy. --No mesenteric lymphadenopathy. --No pelvic or inguinal lymphadenopathy. Reproductive: Unremarkable Other: There is a small amount of free fluid in the patient's abdomen. The abdominal wall is normal. Musculoskeletal. There is an age-indeterminate compression fracture of the L1 vertebral body with approximately 20% height loss anteriorly. There is no retropulsion. Degenerative changes are noted throughout the lumbar spine. IMPRESSION: 1. Acute displaced fracture involving the eighth rib on the left. No pneumothorax. 2. Subcutaneous gas along the patient's left flank. 3. Age-indeterminate compression fracture of the L1 vertebral body with approximately 20% height loss anteriorly. There is no retropulsion. 4. Infrarenal fusiform abdominal aortic aneurysm measuring approximately 5 x 4.6 cm. Recommend follow-up every 6 months and vascular consultation. This recommendation follows ACR consensus guidelines: White Paper of the ACR Incidental Findings Committee II on Vascular Findings. J Am Coll Radiol 2013; 10:789-794. 5. 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 6. Trace to small bilateral pleural effusions. 7. Cardiomegaly and coronary artery disease. 8. Punctate  nonobstructing stone in the interpolar region of the left kidney. 9. Rectosigmoid diverticulosis without acute inflammation. 10. Trace ascites. Aortic Atherosclerosis (ICD10-I70.0) and Emphysema (ICD10-J43.9). Electronically Signed   By: Constance Holster M.D.   On: 12/05/2019 01:11   DG CHEST PORT 1 VIEW  Result Date: 12/05/2019 CLINICAL DATA:  Shortness of breath. Respiratory failure with hypoxia EXAM: PORTABLE CHEST 1 VIEW COMPARISON:  12/05/2019 FINDINGS: Cardiac enlargement with pulmonary vascular congestion. Interstitial changes in the lungs, likely edema. Changes are progressing since previous study. Probable small left pleural effusion or basilar atelectasis. Subcutaneous emphysema in the left chest wall. Previous resection or resorption of the distal clavicles. IMPRESSION: Cardiac enlargement with pulmonary vascular congestion  and interstitial edema, progressing since previous study. Electronically Signed   By: Lucienne Capers M.D.   On: 12/05/2019 06:24   DG Chest Portable 1 View  Result Date: 12/05/2019 CLINICAL DATA:  Shortness of breath. Decreased oxygen saturation. Recent fall with rib injury. EXAM: PORTABLE CHEST 1 VIEW COMPARISON:  12/04/2019 FINDINGS: Shallow inspiration. Cardiac enlargement. No vascular congestion. Linear atelectasis in the lung bases, greater on the left. Probable small left pleural effusion. Displaced left rib fractures with subcutaneous emphysema again demonstrated. No developing pneumothorax. No change since prior study. IMPRESSION: Shallow inspiration with atelectasis in the lung bases and small left pleural effusion. Multiple left rib fractures with subcutaneous emphysema. No developing pneumothorax. Electronically Signed   By: Lucienne Capers M.D.   On: 12/05/2019 00:22   DG Chest Portable 1 View  Result Date: 12/04/2019 CLINICAL DATA:  Severe left rib pain after a fall. EXAM: PORTABLE CHEST 1 VIEW COMPARISON:  08/22/2019 FINDINGS: Shallow inspiration.  Cardiac enlargement with mild pulmonary vascular congestion. Interstitial changes in the bases likely represent early edema. Linear atelectasis in both lung bases, greater on the left. Since the prior study, there are new displaced fractures of the anterolateral left eighth and ninth ribs with subcutaneous emphysema in the left chest wall. No visible pneumothorax. Calcification of the aorta. Postoperative changes in the cervical spine. IMPRESSION: 1. Cardiac enlargement with mild pulmonary vascular congestion and early interstitial edema. 2. Acute displaced fractures of the left eighth and ninth ribs with subcutaneous emphysema in the left chest wall and atelectasis in the left base. Electronically Signed   By: Lucienne Capers M.D.   On: 12/04/2019 23:32   ECHOCARDIOGRAM COMPLETE BUBBLE STUDY  Result Date: 12/05/2019    ECHOCARDIOGRAM REPORT   Patient Name:   Rick Church Date of Exam: 12/05/2019 Medical Rec #:  [id number]      Height:       69.0 in Accession #:    [id number]     Weight:       180.6 lb Date of Birth:  [birth year]       BSA:          1.978 m Patient Age:    54 years       BP:           137/74 mmHg Patient Gender: M              HR:           58 bpm. Exam Location:  Inpatient Procedure: 2D Echo, Color Doppler, Cardiac Doppler and Saline Contrast Bubble            Study Indications:    J69.67 Acute diastolic (congestive) heart failure  History:        Patient has prior history of Echocardiogram examinations, most                 recent 02/18/2019. Hypoxia.  Sonographer:    Merrie Roof RDCS Referring Phys: [id number] Romaldo Saville P Cleda Imel IMPRESSIONS  1. Left ventricular ejection fraction, by estimation, is 65 to 70%. The left ventricle has normal function. The left ventricle has no regional wall motion abnormalities. There is mild left ventricular hypertrophy. Left ventricular diastolic parameters are consistent with Grade I diastolic dysfunction (impaired relaxation).  2. Right ventricular systolic  function is normal. The right ventricular size is mildly enlarged.  3. Left atrial size was severely dilated.  4. Right atrial size was severely dilated.  5. The mitral valve is normal in structure.  No evidence of mitral valve regurgitation. No evidence of mitral stenosis.  6. DI - 0.28. The aortic valve is calcified. Aortic valve regurgitation is not visualized. Moderate aortic valve stenosis. Aortic valve mean gradient measures 23.0 mmHg. Aortic valve Vmax measures 3.22 m/s.  7. The inferior vena cava is dilated in size with <50% respiratory variability, suggesting right atrial pressure of 15 mmHg.  8. Agitated saline contrast bubble study was negative, with no evidence of any interatrial shunt. FINDINGS  Left Ventricle: Left ventricular ejection fraction, by estimation, is 65 to 70%. The left ventricle has normal function. The left ventricle has no regional wall motion abnormalities. The left ventricular internal cavity size was normal in size. There is  mild left ventricular hypertrophy. Left ventricular diastolic parameters are consistent with Grade I diastolic dysfunction (impaired relaxation). Right Ventricle: The right ventricular size is mildly enlarged. No increase in right ventricular wall thickness. Right ventricular systolic function is normal. Left Atrium: Left atrial size was severely dilated. Right Atrium: Right atrial size was severely dilated. Pericardium: There is no evidence of pericardial effusion. Mitral Valve: The mitral valve is normal in structure. No evidence of mitral valve regurgitation. No evidence of mitral valve stenosis. Tricuspid Valve: The tricuspid valve is normal in structure. Tricuspid valve regurgitation is not demonstrated. No evidence of tricuspid stenosis. Aortic Valve: DI - 0.28. The aortic valve is calcified. Aortic valve regurgitation is not visualized. Moderate aortic stenosis is present. Aortic valve mean gradient measures 23.0 mmHg. Aortic valve peak gradient measures  41.3 mmHg. Aortic valve area, by  VTI measures 0.88 cm. Pulmonic Valve: The pulmonic valve was normal in structure. Pulmonic valve regurgitation is not visualized. No evidence of pulmonic stenosis. Aorta: The aortic root is normal in size and structure. Venous: The inferior vena cava is dilated in size with less than 50% respiratory variability, suggesting right atrial pressure of 15 mmHg. IAS/Shunts: No atrial level shunt detected by color flow Doppler. Agitated saline contrast was given intravenously to evaluate for intracardiac shunting. Agitated saline contrast bubble study was negative, with no evidence of any interatrial shunt.  LEFT VENTRICLE PLAX 2D LVIDd:         4.50 cm      Diastology LVIDs:         3.00 cm      LV e' medial:    7.72 cm/s LV PW:         1.10 cm      LV E/e' medial:  14.2 LV IVS:        1.20 cm      LV e' lateral:   8.81 cm/s LVOT diam:     2.00 cm      LV E/e' lateral: 12.5 LV SV:         63 LV SV Index:   32 LVOT Area:     3.14 cm  LV Volumes (MOD) LV vol d, MOD A4C: 125.0 ml LV vol s, MOD A4C: 36.7 ml LV SV MOD A4C:     125.0 ml RIGHT VENTRICLE          IVC RV Basal diam:  4.65 cm  IVC diam: 2.30 cm RV Mid diam:    3.60 cm LEFT ATRIUM              Index       RIGHT ATRIUM           Index LA diam:        4.00 cm  2.02 cm/m  RA Area:     27.70 cm LA Vol (A2C):   145.0 ml 73.29 ml/m RA Volume:   86.70 ml  43.82 ml/m LA Vol (A4C):   72.0 ml  36.39 ml/m LA Biplane Vol: 105.0 ml 53.08 ml/m  AORTIC VALVE AV Area (Vmax):    0.90 cm AV Area (Vmean):   0.93 cm AV Area (VTI):     0.88 cm AV Vmax:           321.50 cm/s AV Vmean:          223.000 cm/s AV VTI:            0.715 m AV Peak Grad:      41.3 mmHg AV Mean Grad:      23.0 mmHg LVOT Vmax:         92.30 cm/s LVOT Vmean:        66.100 cm/s LVOT VTI:          0.200 m LVOT/AV VTI ratio: 0.28  AORTA Ao Root diam: 3.60 cm Ao Asc diam:  3.40 cm MITRAL VALVE MV Area (PHT): 2.24 cm     SHUNTS MV Decel Time: 338 msec     Systemic VTI:  0.20  m MV E velocity: 110.00 cm/s  Systemic Diam: 2.00 cm MV A velocity: 121.00 cm/s MV E/A ratio:  0.91 Candee Furbish MD Electronically signed by Candee Furbish MD Signature Date/Time: 12/05/2019/1:46:14 PM    Final       Subjective: Dizzy at times when he does not where O2.  Otherwise, no fever, vomiting, confusion.  Still coughing up a little sputum.  Still chest pain with certain movements, but none at rest.  Discharge Exam: Vitals:   12/10/19 1259 12/10/19 1330  BP:    Pulse:    Resp:    Temp: 97.8 F (36.6 C)   SpO2:  92%   Vitals:   12/10/19 0727 12/10/19 0751 12/10/19 1259 12/10/19 1330  BP:      Pulse:      Resp:      Temp:   97.8 F (36.6 C)   TempSrc:   Oral   SpO2: (!) 88% 91%  92%  Weight:      Height:        General: Pt is alert, awake, not in acute distress, sitting on edge of bed. Cardiovascular: RRR, nl S1-S2, no murmurs appreciated.   No LE edema.   Respiratory: Normal respiratory rate and rhythm. Dimished air entry, no wheezing. No rales. Abdominal: Abdomen soft and non-tender.  No distension or HSM.   Neuro/Psych: Strength symmetric in upper and lower extremities.  Judgment and insight appear normal.   The results of significant diagnostics from this hospitalization (including imaging, microbiology, ancillary and laboratory) are listed below for reference.     Microbiology: Recent Results (from the past 240 hour(s))  Resp Panel by RT-PCR (Flu A&B, Covid) Nasopharyngeal Swab     Status: None   Collection Time: 12/05/19  1:25 AM   Specimen: Nasopharyngeal Swab; Nasopharyngeal(NP) swabs in vial transport medium  Result Value Ref Range Status   SARS Coronavirus 2 by RT PCR NEGATIVE NEGATIVE Final    Comment: (NOTE) SARS-CoV-2 target nucleic acids are NOT DETECTED.  The SARS-CoV-2 RNA is generally detectable in upper respiratory specimens during the acute phase of infection. The lowest concentration of SARS-CoV-2 viral copies this assay can detect is 138  copies/mL. A negative result does not preclude SARS-Cov-2 infection and should not be used as  the sole basis for treatment or other patient management decisions. A negative result may occur with  improper specimen collection/handling, submission of specimen other than nasopharyngeal swab, presence of viral mutation(s) within the areas targeted by this assay, and inadequate number of viral copies(<138 copies/mL). A negative result must be combined with clinical observations, patient history, and epidemiological information. The expected result is Negative.  Fact Sheet for Patients:  EntrepreneurPulse.com.au  Fact Sheet for Healthcare Providers:  IncredibleEmployment.be  This test is no t yet approved or cleared by the [place] FDA and  has been authorized for detection and/or diagnosis of SARS-CoV-2 by FDA under an Emergency Use Authorization (EUA). This EUA will remain  in effect (meaning this test can be used) for the duration of the COVID-19 declaration under Section 564(b)(1) of the Act, 21 U.S.C.section 360bbb-3(b)(1), unless the authorization is terminated  or revoked sooner.       Influenza A by PCR NEGATIVE NEGATIVE Final   Influenza B by PCR NEGATIVE NEGATIVE Final    Comment: (NOTE) The Xpert Xpress SARS-CoV-2/FLU/RSV plus assay is intended as an aid in the diagnosis of influenza from Nasopharyngeal swab specimens and should not be used as a sole basis for treatment. Nasal washings and aspirates are unacceptable for Xpert Xpress SARS-CoV-2/FLU/RSV testing.  Fact Sheet for Patients: EntrepreneurPulse.com.au  Fact Sheet for Healthcare Providers: IncredibleEmployment.be  This test is not yet approved or cleared by the [place] FDA and has been authorized for detection and/or diagnosis of SARS-CoV-2 by FDA under an Emergency Use Authorization (EUA). This EUA will remain in effect (meaning  this test can be used) for the duration of the COVID-19 declaration under Section 564(b)(1) of the Act, 21 U.S.C. section 360bbb-3(b)(1), unless the authorization is terminated or revoked.  Performed at [facility], [address] [place], [place] [address]   MRSA PCR Screening     Status: None   Collection Time: 12/05/19  4:45 AM   Specimen: Nasal Mucosa; Nasopharyngeal  Result Value Ref Range Status   MRSA by PCR NEGATIVE NEGATIVE Final    Comment:        The GeneXpert MRSA Assay (FDA approved for NASAL specimens only), is one component of a comprehensive MRSA colonization surveillance program. It is not intended to diagnose MRSA infection nor to guide or monitor treatment for MRSA infections. Performed at [facility], [place] [address], [place], [place] [address]      Labs: BNP (last 3 results) Recent Labs    02/18/19 0404 12/04/19 2345 12/09/19 0045  BNP 480.0* 596.8* 93.9   Basic Metabolic Panel: Recent Labs  Lab 12/04/19 2345 12/06/19 0017 12/07/19 0059 12/08/19 0018 12/09/19 0045  NA 137 141 144 142 140  K 4.6 3.7 3.8 4.1 4.1  CL 103 100 98 101 99  CO2 25 31 33* 31 29  GLUCOSE 89 113* 101* 138* 225*  BUN 38* 29* 27* 29* 26*  CREATININE 1.47* 1.22 1.19 1.01 1.03  CALCIUM 8.7* 8.3* 8.5* 8.6* 9.1  MG  --  1.9  --   --   --    Liver Function Tests: Recent Labs  Lab 12/06/19 0017  AST 51*  ALT 87*  ALKPHOS 90  BILITOT 0.8  PROT 5.6*  ALBUMIN 2.9*   No results for input(s): LIPASE, AMYLASE in the last 168 hours. No results for input(s): AMMONIA in the last 168 hours. CBC: Recent Labs  Lab 12/04/19 2345 12/06/19 0017 12/07/19 0059 12/09/19 0045  WBC  13.7* 13.2* 11.0* 9.0  NEUTROABS 10.1* 11.0*  --   --   HGB 14.1 13.5 14.4 14.7  HCT 44.2 42.5 46.1 46.8  MCV 96.5 97.0 96.0 95.7  PLT 224 193 174 192   Cardiac Enzymes: No results for input(s): CKTOTAL, CKMB, CKMBINDEX, TROPONINI in the last 168 hours. BNP: Invalid  input(s): POCBNP CBG: No results for input(s): GLUCAP in the last 168 hours. D-Dimer No results for input(s): DDIMER in the last 72 hours. Hgb A1c No results for input(s): HGBA1C in the last 72 hours. Lipid Profile No results for input(s): CHOL, HDL, LDLCALC, TRIG, CHOLHDL, LDLDIRECT in the last 72 hours. Thyroid function studies No results for input(s): TSH, T4TOTAL, T3FREE, THYROIDAB in the last 72 hours.  Invalid input(s): FREET3 Anemia work up No results for input(s): VITAMINB12, FOLATE, FERRITIN, TIBC, IRON, RETICCTPCT in the last 72 hours. Urinalysis    Component Value Date/Time   COLORURINE YELLOW 02/18/2019 0831   APPEARANCEUR CLEAR 02/18/2019 0831   LABSPEC 1.011 02/18/2019 0831   PHURINE 6.0 02/18/2019 0831   GLUCOSEU NEGATIVE 02/18/2019 0831   HGBUR NEGATIVE 02/18/2019 0831   BILIRUBINUR NEGATIVE 02/18/2019 0831   KETONESUR NEGATIVE 02/18/2019 0831   PROTEINUR NEGATIVE 02/18/2019 0831   UROBILINOGEN 1.0 01/23/2014 0547   NITRITE NEGATIVE 02/18/2019 0831   LEUKOCYTESUR NEGATIVE 02/18/2019 0831   Sepsis Labs Invalid input(s): PROCALCITONIN,  WBC,  LACTICIDVEN Microbiology Recent Results (from the past 240 hour(s))  Resp Panel by RT-PCR (Flu A&B, Covid) Nasopharyngeal Swab     Status: None   Collection Time: 12/05/19  1:25 AM   Specimen: Nasopharyngeal Swab; Nasopharyngeal(NP) swabs in vial transport medium  Result Value Ref Range Status   SARS Coronavirus 2 by RT PCR NEGATIVE NEGATIVE Final    Comment: (NOTE) SARS-CoV-2 target nucleic acids are NOT DETECTED.  The SARS-CoV-2 RNA is generally detectable in upper respiratory specimens during the acute phase of infection. The lowest concentration of SARS-CoV-2 viral copies this assay can detect is 138 copies/mL. A negative result does not preclude SARS-Cov-2 infection and should not be used as the sole basis for treatment or other patient management decisions. A negative result may occur with  improper specimen  collection/handling, submission of specimen other than nasopharyngeal swab, presence of viral mutation(s) within the areas targeted by this assay, and inadequate number of viral copies(<138 copies/mL). A negative result must be combined with clinical observations, patient history, and epidemiological information. The expected result is Negative.  Fact Sheet for Patients:  EntrepreneurPulse.com.au  Fact Sheet for Healthcare Providers:  IncredibleEmployment.be  This test is no t yet approved or cleared by the [place] FDA and  has been authorized for detection and/or diagnosis of SARS-CoV-2 by FDA under an Emergency Use Authorization (EUA). This EUA will remain  in effect (meaning this test can be used) for the duration of the COVID-19 declaration under Section 564(b)(1) of the Act, 21 U.S.C.section 360bbb-3(b)(1), unless the authorization is terminated  or revoked sooner.       Influenza A by PCR NEGATIVE NEGATIVE Final   Influenza B by PCR NEGATIVE NEGATIVE Final    Comment: (NOTE) The Xpert Xpress SARS-CoV-2/FLU/RSV plus assay is intended as an aid in the diagnosis of influenza from Nasopharyngeal swab specimens and should not be used as a sole basis for treatment. Nasal washings and aspirates are unacceptable for Xpert Xpress SARS-CoV-2/FLU/RSV testing.  Fact Sheet for Patients: EntrepreneurPulse.com.au  Fact Sheet for Healthcare Providers: IncredibleEmployment.be  This test is not yet approved or cleared by  the [employer] and has been authorized for detection and/or diagnosis of SARS-CoV-2 by FDA under an Emergency Use Authorization (EUA). This EUA will remain in effect (meaning this test can be used) for the duration of the COVID-19 declaration under Section 564(b)(1) of the Act, 21 U.S.C. section 360bbb-3(b)(1), unless the authorization is terminated or revoked.  Performed at [facility], [address] [place], [place] [address]   MRSA PCR Screening     Status: None   Collection Time: 12/05/19  4:45 AM   Specimen: Nasal Mucosa; Nasopharyngeal  Result Value Ref Range Status   MRSA by PCR NEGATIVE NEGATIVE Final    Comment:        The GeneXpert MRSA Assay (FDA approved for NASAL specimens only), is one component of a comprehensive MRSA colonization surveillance program. It is not intended to diagnose MRSA infection nor to guide or monitor treatment for MRSA infections. Performed at [facility], [place] [address] [place], [place] [address]      Time coordinating discharge: 45 minutes The [place] controlled substances registry was reviewed for this patient      SIGNED:   Edwin Dada, MD  [facility] 12/10/2019, 6:19 PM

## 2019-12-10 NOTE — Care Management Important Message (Signed)
Important Message  Patient Details  Name: Rick Church MRN: [id number] Date of Birth: 10-25-1940   Medicare Important Message Given:  Yes     [name] 12/10/2019, 2:45 PM

## 2019-12-10 NOTE — TOC Transition Note (Addendum)
Transition of Care [facility]) - CM/SW Discharge Note   Patient Details  Name: Rick Church MRN: [id number] Date of Birth: 26-Nov-1940  [facility] CM/SW Contact:  Zenon Mayo, RN Phone Number: 12/10/2019, 11:51 AM   Clinical Narrative:    Patient is for dc today, NCM  Offered choice to patient, he states he does not have a preference, NCM made referral to [facility] with [facility] for [place].  She is able to take referral, but soc will begin on Saturday.  He also needs home oxygen, he is ok to use [facility] for the home oxygen. NCM made referral to [facility] with [facility], the oxygen has been brought to patient's room and they will set up the concentrator at patient's home.  Patient would also like to have outpatient palliative services, he states he is ok with [facility].  NCM made referral to [place] with [facility] for outpatient palliative services.    Final next level of care: [place] Barriers to Discharge: No Barriers Identified   Patient Goals and CMS Choice Patient states their goals for this hospitalization and ongoing recovery are:: go home CMS Medicare.gov Compare Post Acute Care list provided to:: Patient Choice offered to / list presented to : Patient  Discharge Placement                       Discharge Plan and Services                DME Arranged: Oxygen DME Agency: [facility] Date DME Agency Contacted: 12/09/19 Time DME Agency Contacted: 1300 Representative spoke with at DME Agency: Thedore Mins HH Arranged: PT, OT HH Agency: [place] Date [place]: 12/10/19 Time [place]: [place] Representative spoke with at [place]: [place] Determinants of Health ([place]) Interventions     Readmission Risk Interventions No flowsheet data found.

## 2019-12-11 ENCOUNTER — Telehealth: Payer: Self-pay

## 2019-12-11 NOTE — Telephone Encounter (Signed)
Spoke with patient and scheduled a in-person Palliative Consult for 12/17/19 @ 8:30 AM   COVID screening was negative. Patient has 2 dogs in home. He will put away before NP arrives. Patient lives alone.    Consent obtained; updated Outlook/Netsmart/Team List and Epic.

## 2019-12-17 ENCOUNTER — Other Ambulatory Visit: Payer: Self-pay | Admitting: Nurse Practitioner

## 2019-12-17 ENCOUNTER — Ambulatory Visit: Payer: Medicare Other | Admitting: Neurology

## 2019-12-25 ENCOUNTER — Encounter: Payer: Self-pay | Admitting: Neurology

## 2019-12-25 ENCOUNTER — Other Ambulatory Visit: Payer: Self-pay

## 2019-12-25 ENCOUNTER — Other Ambulatory Visit: Payer: Self-pay | Admitting: Nurse Practitioner

## 2019-12-25 NOTE — Progress Notes (Signed)
[employer] Key: [id number] - [place] Case ID: [id number] - Rx #: [id number] Need help? Call us at [phone] Outcome N/Atoday This medication or product is on your plan's list of covered drugs. Prior authorization is not required at this time. If your pharmacy has questions regarding the processing of your prescription, please have them call the [facility] pharmacy help desk at ([phone]. **Please note: Formulary lowering, tiering exception, cost reduction and prospective Medicare hospice reviews cannot be requested using this method of submission. Please contact us at [phone] instead. Drug Pregabalin 75MG  capsules Form [facility] Medicare Part D Electronic Prior Authorization Form (2017 NCPDP) Original Claim Info 51 PRODUCT/SERVICE NOT COVERED PLAN/BENEFIT EXCLUSION NON-COV FOR DISEASE STATE.SCRIPT EXCEEDS PLAN LIMITATIONS CALL DELTA CARE RX [phone] [id number].

## 2019-12-30 ENCOUNTER — Ambulatory Visit: Payer: Medicare Other | Admitting: Surgery

## 2020-01-02 ENCOUNTER — Other Ambulatory Visit: Payer: Self-pay | Admitting: Neurology

## 2020-01-02 ENCOUNTER — Telehealth: Payer: Self-pay | Admitting: Neurology

## 2020-01-02 NOTE — Telephone Encounter (Signed)
I don't believe that I ever prescribed citalopram for Rick Church

## 2020-04-19 NOTE — Progress Notes (Deleted)
NEUROLOGY FOLLOW UP OFFICE NOTE  Rick Church [id number]  Assessment/Plan:   1.  Chronic dizziness/vertigo 2.  Migraine without aura  1.    Subjective:  Rick Church is a54year old male with COPD, HTN, CAD s/p NSTEMI, arthritis and history of C3-C4 cervical disectomy and Legionnaire's pneumonia follows up for dizziness and migraines.  UPDATE: He treats acute dizziness and nausea with meclizine and Zofran.  He takes gabapentin 300mg  twice daily.  He takes Fioricet ***  HISTORY: Since January or February 2018, he has not been feeling well. He reported onset of dizziness. He has prior history of vertigo, but this was different. He described this dizziness as feeling "drunk". There was a sense of movement but not spinning. It was not positional. He also endorsed nausea and abdominal discomfort. He was found to have gallstones. After they were removed in early July, the dizziness and nausea became worse. Prior to surgery, they were episodic, where he would have 3 or 4 days of no dizziness at a time. After the surgery, it was constant. He has had falls due to unsteadiness. He sometimes noted sunspots and double vision. He denied hearing loss but has chronic tinnitus. He denied slurred speech, facial droop or unilateral numbness or weakness. Meclizine was ineffective. Zofran did not help with nausea. He tried the Epley maneuver, which helped with his previous vertigo, but it was ineffective. CT of head from 09/04/16 was personally reviewed and revealed signs of cerebrovascular disease but no acute findings or mass lesions. He takes Lyrica once daily for neuralgia. In September 2018, he started taking it 3 times daily (as originally prescribed) and the dizziness and nausea resolved.It subsequently returned. He later was started on gabapentin 300mg  at bedtime and underwent vestibular rehab and dizziness again resolved. CTA of head was performed on 10/11/16, which was  personally reviewed and revealed no significant intracranial stenosis or occlusion, including the vertebrobasilar system.  To evaluate for a cervicogenic component to his dizziness, MRI of cervical spine was performed on 05/03/2019, which showed spondylosis including slight bilateral facet arthritis at C3-4.  In August, he fell 10 feet off of his porch and landed on a piece of plywood sustaining severe neck pain and numbness in the hands.  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  Carotid ultrasound was ordered but not performed.  He saw ENT.  Vestibular testing did not demonstrate any significant central or peripheral etiology.   He has significant stroke risk factors. He has prior strokes. He is a smoker. He has hypertension and CAD. He takes ASA 81mg  daily.  He also has longstanding history of migrainessince the early 1960s, described as severe and pounding, beginning from back of neck and involving the front of his head. Associated nausea.They would occur daily. Stress is trigger. Fioricet helps relieve it.Past medication includes nortriptyline. Cambia was effective in the past.  Past medication:  Lyrica, nortriptyline, Cambia  PAST MEDICAL HISTORY: Past Medical History:  Diagnosis Date  . Acute respiratory distress syndrome (ARDS) ([facility])   . Anxiety   . Aortic stenosis    mild AS 01/2014 echo  . Arthritis   . COPD (chronic obstructive pulmonary disease) ([place])   . GERD (  gastroesophageal reflux disease)   . Headache   . High cholesterol   . History of hiatal hernia   . Hx of adenomatous colonic polyps   . Hypertension   . Legionnaire's disease ([place])   .  Migraine   . Neck pain   . NSTEMI (non-ST elevated myocardial infarction) ([place])    12/2013; cath non-obstructive CAD  . Panic attack   . Pneumonia 2015  . PONV (postoperative nausea and vomiting)    Vomitted after last endo precodure   . 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 Stroke ([place])    hx of x 3 - no residual  -- patient was unaware of CVA    MEDICATIONS: Current Outpatient Medications on File Prior to Visit  Medication Sig Dispense Refill  . acetaminophen (TYLENOL) 500 MG tablet Take 500 mg by mouth every 6 (six) hours as needed for mild pain or headache.     Marland Kitchen amLODipine (NORVASC) 10 MG tablet Take 10 mg by mouth at bedtime.   1  . aspirin EC 81 MG tablet Take 81 mg by mouth at bedtime.     . butalbital-acetaminophen-caffeine (FIORICET) 50-325-40 MG tablet TAKE 1 TABLET BY MOUTH EVERY 6 HOURS AS NEEDED FOR MIGRAINE (Patient taking differently: Take 1 tablet by mouth every 6 (six) hours as needed for migraine. ) 10 tablet 4  . CAMBIA 50 MG PACK TAKE AS NEEDED FOR SEVERE MIGRAINE (Patient taking differently: Take 1 each by mouth daily as needed (migraine). ) 9 each 11  . cefdinir (OMNICEF) 300 MG capsule Take 1 capsule (300 mg total) by mouth 2 (two) times daily. 10 capsule 0  . citalopram (CELEXA) 40 MG tablet Take 40 mg by mouth at bedtime.    . clonazePAM (KLONOPIN) 2 MG tablet Take 1 tablet (2 mg total) by mouth 2 (two) times daily as needed for anxiety. (Patient taking differently: Take 2 mg by mouth See admin instructions. Take 2 mg by mouth at bedtime and an additional 2 mg once a day) 30 tablet 0  . fluticasone furoate-vilanterol (BREO ELLIPTA) 100-25 MCG/INH AEPB Inhale 1 puff into the lungs daily. RINSE MOUTH AFTER USING 1 each 2  . furosemide (LASIX) 40 MG tablet Take 1 tablet (40 mg total) by mouth daily. 30 tablet 11  . gabapentin (NEURONTIN) 300 MG capsule TAKE 1 CAPSULE BY MOUTH  TWICE DAILY (Patient taking differently: Take 300 mg by mouth 2 (two) times daily. ) 180 capsule 3  .  HYDROmorphone (DILAUDID) 2 MG tablet Take 2 mg by mouth every 8 (eight) hours as needed (for pain).     Marland Kitchen ipratropium-albuterol (DUONEB) 0.5-2.5 (3) MG/3ML SOLN Take 3 mLs by nebulization 3 (three) times daily. 360 mL 2  . meclizine (ANTIVERT) 25 MG tablet Take 50-75 mg by mouth See admin instructions. Take 75 mg by mouth in the morning and an additional 50 mg once daily as needed for vertigo (or nausea)    . Multiple Vitamin (MULTIVITAMIN WITH MINERALS) TABS tablet Take 1 tablet by mouth at bedtime.     . ondansetron (ZOFRAN-ODT) 4 MG disintegrating tablet Take 4 mg by mouth every 8 (eight) hours as needed for nausea or vomiting (DISSOLVE IN THE MOUTH).    . pantoprazole (PROTONIX) 40 MG tablet Take 1 tablet (40 mg total) by mouth daily. (Patient taking differently: Take 40 mg by mouth daily as needed (for heartburn). ) 30 tablet 0  . predniSONE (DELTASONE) 10 MG tablet Take prednisone 40 mg (4 tabs) daily for 3  days then take 30 mg (3 tabs) daily for 3 days then take 20 mg (2 tabs) daily for three days then take 10 mg (1 tab) daily 40 tablet 0  . pregabalin (LYRICA) 75 MG capsule TAKE 1 CAPSULE BY MOUTH THREE TIMES DAILY AS NEEDED FOR PAIN (Patient taking differently: Take 75 mg by mouth 3 (three) times daily as needed (for pain). ) 270 capsule 0  . PROVENTIL HFA 108 (90 Base) MCG/ACT inhaler Inhale 2 puffs into the lungs every 6 (six) hours as needed for wheezing or shortness of breath.     . simvastatin (ZOCOR) 80 MG tablet Take 40 mg by mouth at bedtime.     . traZODone (DESYREL) 100 MG tablet Take 100 mg by mouth at bedtime.   6   No current facility-administered medications on file prior to visit.    ALLERGIES: Allergies  Allergen Reactions  . Propofol Nausea And Vomiting and Other (See Comments)    Severe vomiting with anesthesia   . Atorvastatin Other (See Comments)    leg myalgias  . Indocin [Indomethacin] Nausea Only and Other (See Comments)    Dizziness   . Penicillins Hives and  Other (See Comments)    Tolerated a dose of CEFEPIME 12/31/13 Did it involve swelling of the face/tongue/throat, SOB, or low BP? No Did it involve sudden or severe rash/hives, skin peeling, or any reaction on the inside of your mouth or nose? Yes Did you need to seek medical attention at a hospital or doctor's office? No When did it last happen?1961 If all above answers are "NO", may proceed with cephalosporin use.   Marland Kitchen Buspirone Other (See Comments)    UNSPECIFIED INTOLERANCE  . Restoril [Temazepam] Other (See Comments)    UNSPECIFIED INTOLERANCE  . Toprol Xl [Metoprolol Tartrate] Other (See Comments)    UNSPECIFIED REACTION    . Asa [Aspirin] Itching and Rash  . Codeine Nausea And Vomiting  . Hytrin [Terazosin] Nausea And Vomiting       . Oxycodone Nausea Only and Other (See Comments)    "Deathly sick"- CAN TOLERATE DILAUDID  . Zestril [Lisinopril] Other (See Comments)    Makes him feel "really badly- doesn't feel like getting up in the morning"    FAMILY HISTORY: Family History  Problem Relation Age of Onset  . Hypertension Mother   . Brain cancer Mother   . Lung cancer Mother   . Hypertension Brother   . Cervical cancer Sister   . Hypertension Father   . Cerebral aneurysm Father   . Colon cancer Neg Hx   . Esophageal cancer Neg Hx   . Inflammatory bowel disease Neg Hx   . Liver disease Neg Hx   . Pancreatic cancer Neg Hx   . Rectal cancer Neg Hx   . Stomach cancer Neg Hx       Objective:  *** General: No acute distress.  Patient appears well-groomed.   Head:  Normocephalic/atraumatic Eyes:  Fundi examined but not visualized Neck: supple, no paraspinal tenderness, full range of motion Heart:  Regular rate and rhythm Lungs:  Clear to auscultation bilaterally Back: No paraspinal tenderness Neurological Exam: alert and oriented to person, place, and time. Attention span and concentration intact, recent and remote memory intact, fund of knowledge intact.  Speech  fluent and not dysarthric, language intact.  CN II-XII intact. Bulk and tone normal, muscle strength 5/5 throughout.  Sensation to light touch, temperature and vibration intact.  Deep tendon reflexes 2+ throughout, toes downgoing.  Finger to nose and heel to shin testing intact.  Gait normal, Romberg negative.     Metta Clines, DO  CC: Aura Dials, MD

## 2020-04-20 ENCOUNTER — Ambulatory Visit: Payer: Medicare Other | Admitting: Neurology

## 2020-05-10 DEATH — deceased
